# Patient Record
Sex: Male | Born: 1935
Health system: Southern US, Community
[De-identification: ages and names within clinical notes are randomized; demographics above are authoritative.]

## PROBLEM LIST (undated history)

## (undated) DIAGNOSIS — F250 Schizoaffective disorder, bipolar type: Secondary | ICD-10-CM

## (undated) DIAGNOSIS — R413 Other amnesia: Secondary | ICD-10-CM

## (undated) DIAGNOSIS — J449 Chronic obstructive pulmonary disease, unspecified: Secondary | ICD-10-CM

## (undated) DIAGNOSIS — I10 Essential (primary) hypertension: Secondary | ICD-10-CM

## (undated) DIAGNOSIS — I82409 Acute embolism and thrombosis of unspecified deep veins of unspecified lower extremity: Secondary | ICD-10-CM

## (undated) DIAGNOSIS — R569 Unspecified convulsions: Secondary | ICD-10-CM

## (undated) DIAGNOSIS — Z974 Presence of external hearing-aid: Secondary | ICD-10-CM

## (undated) DIAGNOSIS — R911 Solitary pulmonary nodule: Secondary | ICD-10-CM

## (undated) DIAGNOSIS — F259 Schizoaffective disorder, unspecified: Secondary | ICD-10-CM

## (undated) DIAGNOSIS — E119 Type 2 diabetes mellitus without complications: Secondary | ICD-10-CM

## (undated) DIAGNOSIS — R7303 Prediabetes: Secondary | ICD-10-CM

## (undated) DIAGNOSIS — C801 Malignant (primary) neoplasm, unspecified: Secondary | ICD-10-CM

## (undated) DIAGNOSIS — H9190 Unspecified hearing loss, unspecified ear: Secondary | ICD-10-CM

## (undated) DIAGNOSIS — J189 Pneumonia, unspecified organism: Secondary | ICD-10-CM

## (undated) DIAGNOSIS — K08109 Complete loss of teeth, unspecified cause, unspecified class: Secondary | ICD-10-CM

## (undated) DIAGNOSIS — Z972 Presence of dental prosthetic device (complete) (partial): Secondary | ICD-10-CM

## (undated) HISTORY — PX: OTHER SURGICAL HISTORY: SHX169

## (undated) HISTORY — PX: THROMBECTOMY: PRO61

## (undated) HISTORY — PX: COLONOSCOPY: SHX174

## (undated) HISTORY — PX: CATARACT EXTRACTION: SUR2

---

## 2002-02-10 ENCOUNTER — Emergency Department (HOSPITAL_COMMUNITY): Admission: EM | Admit: 2002-02-10 | Discharge: 2002-02-10 | Payer: Self-pay | Admitting: Emergency Medicine

## 2002-04-25 ENCOUNTER — Encounter: Payer: Self-pay | Admitting: Otolaryngology

## 2002-04-25 ENCOUNTER — Ambulatory Visit (HOSPITAL_COMMUNITY): Admission: RE | Admit: 2002-04-25 | Discharge: 2002-04-25 | Payer: Self-pay | Admitting: Otolaryngology

## 2004-10-18 ENCOUNTER — Ambulatory Visit: Payer: Self-pay

## 2004-11-15 ENCOUNTER — Ambulatory Visit: Payer: Self-pay | Admitting: Cardiology

## 2004-12-03 ENCOUNTER — Ambulatory Visit: Payer: Self-pay | Admitting: Internal Medicine

## 2004-12-13 ENCOUNTER — Ambulatory Visit: Payer: Self-pay | Admitting: Cardiovascular Disease

## 2004-12-18 ENCOUNTER — Ambulatory Visit: Payer: Self-pay | Admitting: Internal Medicine

## 2005-01-03 ENCOUNTER — Ambulatory Visit: Payer: Self-pay | Admitting: Cardiovascular Disease

## 2005-01-31 ENCOUNTER — Ambulatory Visit: Payer: Self-pay | Admitting: Cardiology

## 2005-02-28 ENCOUNTER — Ambulatory Visit: Payer: Self-pay | Admitting: Cardiology

## 2005-04-02 ENCOUNTER — Ambulatory Visit: Payer: Self-pay | Admitting: *Deleted

## 2005-04-30 ENCOUNTER — Ambulatory Visit: Payer: Self-pay | Admitting: Cardiology

## 2005-05-28 ENCOUNTER — Ambulatory Visit: Payer: Self-pay | Admitting: Cardiology

## 2005-06-19 ENCOUNTER — Ambulatory Visit: Payer: Self-pay | Admitting: Internal Medicine

## 2005-06-25 ENCOUNTER — Ambulatory Visit: Payer: Self-pay | Admitting: Cardiovascular Disease

## 2005-07-03 ENCOUNTER — Ambulatory Visit: Payer: Self-pay | Admitting: Cardiovascular Disease

## 2005-07-08 ENCOUNTER — Ambulatory Visit: Payer: Self-pay

## 2005-07-23 ENCOUNTER — Ambulatory Visit: Payer: Self-pay | Admitting: Cardiology

## 2005-08-15 ENCOUNTER — Ambulatory Visit: Payer: Self-pay | Admitting: Cardiovascular Disease

## 2005-08-20 ENCOUNTER — Ambulatory Visit: Payer: Self-pay | Admitting: Cardiology

## 2005-09-17 ENCOUNTER — Ambulatory Visit: Payer: Self-pay | Admitting: Cardiovascular Disease

## 2005-10-03 ENCOUNTER — Ambulatory Visit: Payer: Self-pay | Admitting: Internal Medicine

## 2005-10-15 ENCOUNTER — Ambulatory Visit: Payer: Self-pay | Admitting: Cardiology

## 2005-11-19 ENCOUNTER — Ambulatory Visit: Payer: Self-pay | Admitting: Cardiology

## 2005-12-10 ENCOUNTER — Ambulatory Visit: Payer: Self-pay | Admitting: Cardiology

## 2006-01-07 ENCOUNTER — Ambulatory Visit: Payer: Self-pay | Admitting: Cardiology

## 2006-01-20 ENCOUNTER — Emergency Department (HOSPITAL_COMMUNITY): Admission: EM | Admit: 2006-01-20 | Discharge: 2006-01-20 | Payer: Self-pay | Admitting: Emergency Medicine

## 2006-01-21 ENCOUNTER — Ambulatory Visit (HOSPITAL_COMMUNITY): Admission: RE | Admit: 2006-01-21 | Discharge: 2006-01-21 | Payer: Self-pay | Admitting: Emergency Medicine

## 2006-02-04 ENCOUNTER — Ambulatory Visit: Payer: Self-pay | Admitting: Internal Medicine

## 2006-02-10 ENCOUNTER — Ambulatory Visit: Payer: Self-pay | Admitting: Internal Medicine

## 2006-02-14 ENCOUNTER — Ambulatory Visit (HOSPITAL_COMMUNITY): Admission: RE | Admit: 2006-02-14 | Discharge: 2006-02-14 | Payer: Self-pay | Admitting: Internal Medicine

## 2006-02-16 ENCOUNTER — Ambulatory Visit: Payer: Self-pay | Admitting: Internal Medicine

## 2006-02-16 ENCOUNTER — Ambulatory Visit: Payer: Self-pay | Admitting: Cardiovascular Disease

## 2006-02-18 ENCOUNTER — Ambulatory Visit: Payer: Self-pay | Admitting: Internal Medicine

## 2006-03-18 ENCOUNTER — Ambulatory Visit: Payer: Self-pay | Admitting: *Deleted

## 2006-04-15 ENCOUNTER — Ambulatory Visit: Payer: Self-pay | Admitting: Cardiology

## 2006-05-13 ENCOUNTER — Ambulatory Visit: Payer: Self-pay | Admitting: Cardiovascular Disease

## 2006-05-20 ENCOUNTER — Ambulatory Visit: Payer: Self-pay | Admitting: Internal Medicine

## 2006-06-04 ENCOUNTER — Ambulatory Visit: Payer: Self-pay | Admitting: Internal Medicine

## 2006-07-02 ENCOUNTER — Ambulatory Visit: Payer: Self-pay | Admitting: Cardiology

## 2006-07-30 ENCOUNTER — Ambulatory Visit: Payer: Self-pay | Admitting: Cardiology

## 2006-08-18 ENCOUNTER — Ambulatory Visit: Payer: Self-pay | Admitting: Cardiovascular Disease

## 2006-08-27 ENCOUNTER — Ambulatory Visit: Payer: Self-pay | Admitting: Cardiology

## 2006-09-24 ENCOUNTER — Ambulatory Visit: Payer: Self-pay | Admitting: Cardiology

## 2006-10-01 ENCOUNTER — Ambulatory Visit: Payer: Self-pay | Admitting: Internal Medicine

## 2006-10-01 LAB — CONVERTED CEMR LAB
ALT: 29 units/L (ref 0–40)
AST: 22 units/L (ref 0–37)
BUN: 13 mg/dL (ref 6–23)
Chol/HDL Ratio, serum: 3.6
Cholesterol: 181 mg/dL (ref 0–200)
Creatinine, Ser: 1.3 mg/dL (ref 0.4–1.5)
Creatinine,U: 236.7 mg/dL
HDL: 50 mg/dL (ref >39.0)
Hgb A1c MFr Bld: 5.9 % (ref 4.6–6.0)
LDL Cholesterol: 112 mg/dL — ABNORMAL HIGH (ref 0–99)
Microalb Creat Ratio: 0.8 mg/g (ref 0.0–30.0)
Microalb, Ur: 0.2 mg/dL (ref 0.0–1.9)
Potassium: 4 meq/L (ref 3.5–5.1)
Triglyceride fasting, serum: 95 mg/dL (ref 0–149)
VLDL: 19 mg/dL (ref 0–40)

## 2006-10-05 ENCOUNTER — Ambulatory Visit: Payer: Self-pay | Admitting: Internal Medicine

## 2006-10-23 ENCOUNTER — Ambulatory Visit: Payer: Self-pay | Admitting: Internal Medicine

## 2006-10-23 ENCOUNTER — Ambulatory Visit: Payer: Self-pay | Admitting: Cardiology

## 2006-10-23 LAB — CONVERTED CEMR LAB: Total CK: 265 units/L (ref 7–195)

## 2006-10-27 ENCOUNTER — Ambulatory Visit: Payer: Self-pay | Admitting: Internal Medicine

## 2006-11-12 ENCOUNTER — Ambulatory Visit: Payer: Self-pay | Admitting: Cardiology

## 2006-12-03 ENCOUNTER — Ambulatory Visit: Payer: Self-pay | Admitting: Cardiology

## 2006-12-14 ENCOUNTER — Ambulatory Visit: Payer: Self-pay | Admitting: Internal Medicine

## 2006-12-14 LAB — CONVERTED CEMR LAB
ALT: 27 units/L (ref 0–40)
AST: 22 units/L (ref 0–37)
BUN: 13 mg/dL (ref 6–23)
Chol/HDL Ratio, serum: 3.5
Cholesterol: 163 mg/dL (ref 0–200)
Creatinine, Ser: 1.4 mg/dL (ref 0.4–1.5)
HDL: 46.4 mg/dL (ref >39.0)
LDL Cholesterol: 88 mg/dL (ref 0–99)
Potassium: 4 meq/L (ref 3.5–5.1)
Total CK: 214 units/L (ref 7–195)
Triglyceride fasting, serum: 145 mg/dL (ref 0–149)
VLDL: 29 mg/dL (ref 0–40)

## 2006-12-21 ENCOUNTER — Ambulatory Visit: Payer: Self-pay | Admitting: Internal Medicine

## 2006-12-31 ENCOUNTER — Ambulatory Visit: Payer: Self-pay | Admitting: Cardiology

## 2007-01-28 ENCOUNTER — Ambulatory Visit: Payer: Self-pay | Admitting: Cardiology

## 2007-02-25 ENCOUNTER — Ambulatory Visit: Payer: Self-pay | Admitting: Cardiology

## 2007-03-19 DIAGNOSIS — Z86718 Personal history of other venous thrombosis and embolism: Secondary | ICD-10-CM | POA: Insufficient documentation

## 2007-03-19 DIAGNOSIS — I739 Peripheral vascular disease, unspecified: Secondary | ICD-10-CM | POA: Insufficient documentation

## 2007-03-19 DIAGNOSIS — Z85828 Personal history of other malignant neoplasm of skin: Secondary | ICD-10-CM | POA: Insufficient documentation

## 2007-03-19 DIAGNOSIS — F200 Paranoid schizophrenia: Secondary | ICD-10-CM | POA: Insufficient documentation

## 2007-03-25 ENCOUNTER — Ambulatory Visit: Payer: Self-pay | Admitting: Cardiology

## 2007-04-22 ENCOUNTER — Ambulatory Visit: Payer: Self-pay | Admitting: Cardiology

## 2007-05-20 ENCOUNTER — Ambulatory Visit: Payer: Self-pay | Admitting: Cardiology

## 2007-06-03 ENCOUNTER — Ambulatory Visit: Payer: Self-pay | Admitting: Internal Medicine

## 2007-06-03 LAB — CONVERTED CEMR LAB
ALT: 36 units/L (ref 0–53)
AST: 29 units/L (ref 0–37)
Cholesterol: 237 mg/dL (ref 0–200)
Direct LDL: 159.4 mg/dL
HDL: 53.3 mg/dL (ref >39.0)
Total CHOL/HDL Ratio: 4.4
Triglycerides: 155 mg/dL — ABNORMAL HIGH (ref 0–149)
VLDL: 31 mg/dL (ref 0–40)

## 2007-06-08 ENCOUNTER — Ambulatory Visit: Payer: Self-pay | Admitting: Internal Medicine

## 2007-06-08 DIAGNOSIS — E785 Hyperlipidemia, unspecified: Secondary | ICD-10-CM | POA: Insufficient documentation

## 2007-06-08 LAB — CONVERTED CEMR LAB
Cholesterol, target level: 200 mg/dL
HDL goal, serum: 40 mg/dL
LDL Goal: 100 mg/dL

## 2007-06-17 ENCOUNTER — Ambulatory Visit: Payer: Self-pay | Admitting: Cardiology

## 2007-07-08 ENCOUNTER — Ambulatory Visit: Payer: Self-pay | Admitting: Internal Medicine

## 2007-08-03 ENCOUNTER — Ambulatory Visit: Payer: Self-pay | Admitting: Cardiology

## 2007-08-04 ENCOUNTER — Ambulatory Visit: Payer: Self-pay | Admitting: Internal Medicine

## 2007-08-06 ENCOUNTER — Encounter (INDEPENDENT_AMBULATORY_CARE_PROVIDER_SITE_OTHER): Payer: Self-pay | Admitting: *Deleted

## 2007-08-06 LAB — CONVERTED CEMR LAB
Cholesterol: 183 mg/dL (ref 0–200)
HDL: 34.5 mg/dL — ABNORMAL LOW (ref >39.0)
LDL Cholesterol: 119 mg/dL — ABNORMAL HIGH (ref 0–99)
Total CHOL/HDL Ratio: 5.3
Triglycerides: 149 mg/dL (ref 0–149)
VLDL: 30 mg/dL (ref 0–40)

## 2007-08-11 ENCOUNTER — Telehealth: Payer: Self-pay | Admitting: Internal Medicine

## 2007-08-12 ENCOUNTER — Telehealth (INDEPENDENT_AMBULATORY_CARE_PROVIDER_SITE_OTHER): Payer: Self-pay | Admitting: *Deleted

## 2007-08-12 ENCOUNTER — Ambulatory Visit: Payer: Self-pay | Admitting: Internal Medicine

## 2007-08-15 ENCOUNTER — Ambulatory Visit: Payer: Self-pay | Admitting: Internal Medicine

## 2007-08-15 ENCOUNTER — Inpatient Hospital Stay (HOSPITAL_COMMUNITY): Admission: EM | Admit: 2007-08-15 | Discharge: 2007-08-17 | Payer: Self-pay | Admitting: Emergency Medicine

## 2007-08-27 ENCOUNTER — Ambulatory Visit: Payer: Self-pay | Admitting: Cardiovascular Disease

## 2007-08-30 ENCOUNTER — Ambulatory Visit: Payer: Self-pay | Admitting: Internal Medicine

## 2007-08-30 DIAGNOSIS — T887XXA Unspecified adverse effect of drug or medicament, initial encounter: Secondary | ICD-10-CM | POA: Insufficient documentation

## 2007-09-16 ENCOUNTER — Telehealth (INDEPENDENT_AMBULATORY_CARE_PROVIDER_SITE_OTHER): Payer: Self-pay | Admitting: *Deleted

## 2007-09-17 ENCOUNTER — Ambulatory Visit: Payer: Self-pay | Admitting: Cardiovascular Disease

## 2007-10-15 ENCOUNTER — Ambulatory Visit: Payer: Self-pay | Admitting: Cardiology

## 2007-10-21 ENCOUNTER — Ambulatory Visit: Payer: Self-pay | Admitting: Internal Medicine

## 2007-10-23 LAB — CONVERTED CEMR LAB
ALT: 40 units/L (ref 0–53)
HDL: 43.9 mg/dL (ref >39.0)
LDL Cholesterol: 77 mg/dL (ref 0–99)
Triglycerides: 112 mg/dL (ref 0–149)
VLDL: 22 mg/dL (ref 0–40)

## 2007-10-25 ENCOUNTER — Encounter (INDEPENDENT_AMBULATORY_CARE_PROVIDER_SITE_OTHER): Payer: Self-pay | Admitting: *Deleted

## 2007-11-05 ENCOUNTER — Ambulatory Visit: Payer: Self-pay | Admitting: Cardiology

## 2007-11-15 ENCOUNTER — Ambulatory Visit: Payer: Self-pay | Admitting: Surgery

## 2007-12-13 ENCOUNTER — Telehealth (INDEPENDENT_AMBULATORY_CARE_PROVIDER_SITE_OTHER): Payer: Self-pay | Admitting: *Deleted

## 2007-12-22 ENCOUNTER — Emergency Department (HOSPITAL_COMMUNITY): Admission: EM | Admit: 2007-12-22 | Discharge: 2007-12-22 | Payer: Self-pay | Admitting: Emergency Medicine

## 2008-01-25 ENCOUNTER — Telehealth (INDEPENDENT_AMBULATORY_CARE_PROVIDER_SITE_OTHER): Payer: Self-pay | Admitting: *Deleted

## 2008-01-26 ENCOUNTER — Encounter: Payer: Self-pay | Admitting: Internal Medicine

## 2008-05-11 ENCOUNTER — Telehealth (INDEPENDENT_AMBULATORY_CARE_PROVIDER_SITE_OTHER): Payer: Self-pay | Admitting: *Deleted

## 2008-05-16 ENCOUNTER — Encounter: Payer: Self-pay | Admitting: Internal Medicine

## 2008-11-01 ENCOUNTER — Encounter: Payer: Self-pay | Admitting: Internal Medicine

## 2008-11-22 ENCOUNTER — Ambulatory Visit: Payer: Self-pay | Admitting: Oncology

## 2008-11-30 ENCOUNTER — Ambulatory Visit: Admission: RE | Admit: 2008-11-30 | Discharge: 2008-11-30 | Payer: Self-pay | Admitting: Oncology

## 2008-11-30 LAB — URINALYSIS, MICROSCOPIC - CHCC
Ketones: NEGATIVE mg/dL
Nitrite: NEGATIVE
Protein: NEGATIVE mg/dL
Specific Gravity, Urine: 1.005 (ref 1.003–1.035)
pH: 6 (ref 4.6–8.0)

## 2008-11-30 LAB — CBC WITH DIFFERENTIAL/PLATELET
BASO%: 0.6 % (ref 0.0–2.0)
Basophils Absolute: 0 10*3/uL (ref 0.0–0.1)
EOS%: 3.5 % (ref 0.0–7.0)
HGB: 17.2 g/dL — ABNORMAL HIGH (ref 13.0–17.1)
MCH: 30.3 pg (ref 28.0–33.4)
MCHC: 33.5 g/dL (ref 32.0–35.9)
RDW: 14.4 % (ref 11.2–14.6)
WBC: 8.4 10*3/uL (ref 4.0–10.0)
lymph#: 2.5 10*3/uL (ref 0.9–3.3)

## 2008-11-30 LAB — MORPHOLOGY: PLT EST: ADEQUATE

## 2008-12-05 LAB — ERYTHROPOIETIN: Erythropoietin: 7.6 m[IU]/mL (ref 2.6–34.0)

## 2008-12-05 LAB — COMPREHENSIVE METABOLIC PANEL
Albumin: 4.6 g/dL (ref 3.5–5.2)
Alkaline Phosphatase: 33 U/L — ABNORMAL LOW (ref 39–117)
BUN: 23 mg/dL (ref 6–23)
Calcium: 9.6 mg/dL (ref 8.4–10.5)
Chloride: 105 mEq/L (ref 96–112)
Glucose, Bld: 97 mg/dL (ref 70–99)
Potassium: 4.4 mEq/L (ref 3.5–5.3)
Sodium: 142 mEq/L (ref 135–145)
Total Protein: 7.2 g/dL (ref 6.0–8.3)

## 2008-12-28 LAB — CBC WITH DIFFERENTIAL/PLATELET
BASO%: 0.6 % (ref 0.0–2.0)
EOS%: 3.7 % (ref 0.0–7.0)
HCT: 50.7 % — ABNORMAL HIGH (ref 38.7–49.9)
MCH: 30.1 pg (ref 28.0–33.4)
MCHC: 33.8 g/dL (ref 32.0–35.9)
NEUT%: 54.2 % (ref 40.0–75.0)
RBC: 5.69 10*6/uL (ref 4.20–5.71)
lymph#: 2.7 10*3/uL (ref 0.9–3.3)

## 2009-04-23 ENCOUNTER — Ambulatory Visit: Payer: Self-pay | Admitting: Oncology

## 2009-04-25 LAB — CBC WITH DIFFERENTIAL/PLATELET
BASO%: 0.5 % (ref 0.0–2.0)
EOS%: 3.9 % (ref 0.0–7.0)
Eosinophils Absolute: 0.3 10*3/uL (ref 0.0–0.5)
LYMPH%: 29.8 % (ref 14.0–49.0)
MCH: 29.9 pg (ref 27.2–33.4)
MCHC: 34.2 g/dL (ref 32.0–36.0)
MCV: 87.4 fL (ref 79.3–98.0)
MONO%: 9.8 % (ref 0.0–14.0)
Platelets: 213 10*3/uL (ref 140–400)
RBC: 6.09 10*6/uL — ABNORMAL HIGH (ref 4.20–5.82)
RDW: 14.4 % (ref 11.0–14.6)

## 2009-04-25 LAB — CHCC SMEAR

## 2009-05-30 ENCOUNTER — Encounter: Payer: Self-pay | Admitting: Internal Medicine

## 2009-06-22 ENCOUNTER — Ambulatory Visit: Payer: Self-pay | Admitting: Oncology

## 2009-06-27 LAB — CBC WITH DIFFERENTIAL/PLATELET
Eosinophils Absolute: 0.3 10*3/uL (ref 0.0–0.5)
MONO#: 0.5 10*3/uL (ref 0.1–0.9)
NEUT#: 4.7 10*3/uL (ref 1.5–6.5)
RBC: 5.77 10*6/uL (ref 4.20–5.82)
RDW: 13.7 % (ref 11.0–14.6)
WBC: 7.7 10*3/uL (ref 4.0–10.3)
lymph#: 2.2 10*3/uL (ref 0.9–3.3)

## 2009-08-21 ENCOUNTER — Ambulatory Visit: Payer: Self-pay | Admitting: Oncology

## 2009-08-23 LAB — CBC WITH DIFFERENTIAL/PLATELET
BASO%: 0.2 % (ref 0.0–2.0)
Basophils Absolute: 0 10*3/uL (ref 0.0–0.1)
EOS%: 3.7 % (ref 0.0–7.0)
HGB: 17.6 g/dL — ABNORMAL HIGH (ref 13.0–17.1)
MCH: 30.8 pg (ref 27.2–33.4)
MCHC: 34.3 g/dL (ref 32.0–36.0)
MCV: 89.6 fL (ref 79.3–98.0)
MONO%: 9.7 % (ref 0.0–14.0)
NEUT%: 57.6 % (ref 39.0–75.0)
RDW: 14.2 % (ref 11.0–14.6)

## 2009-08-23 LAB — COMPREHENSIVE METABOLIC PANEL
Albumin: 4.4 g/dL (ref 3.5–5.2)
BUN: 17 mg/dL (ref 6–23)
CO2: 23 mEq/L (ref 19–32)
Glucose, Bld: 134 mg/dL — ABNORMAL HIGH (ref 70–99)
Potassium: 3.9 mEq/L (ref 3.5–5.3)
Sodium: 139 mEq/L (ref 135–145)
Total Protein: 6.9 g/dL (ref 6.0–8.3)

## 2009-08-23 LAB — MORPHOLOGY

## 2009-10-22 ENCOUNTER — Ambulatory Visit: Payer: Self-pay | Admitting: Oncology

## 2009-10-24 DIAGNOSIS — D751 Secondary polycythemia: Secondary | ICD-10-CM | POA: Insufficient documentation

## 2009-10-24 DIAGNOSIS — N529 Male erectile dysfunction, unspecified: Secondary | ICD-10-CM | POA: Insufficient documentation

## 2009-10-24 DIAGNOSIS — Z720 Tobacco use: Secondary | ICD-10-CM | POA: Insufficient documentation

## 2009-10-24 DIAGNOSIS — F209 Schizophrenia, unspecified: Secondary | ICD-10-CM | POA: Insufficient documentation

## 2009-10-24 DIAGNOSIS — M1990 Unspecified osteoarthritis, unspecified site: Secondary | ICD-10-CM | POA: Insufficient documentation

## 2009-10-24 DIAGNOSIS — F329 Major depressive disorder, single episode, unspecified: Secondary | ICD-10-CM | POA: Insufficient documentation

## 2009-10-24 LAB — CBC WITH DIFFERENTIAL/PLATELET
BASO%: 0.5 % (ref 0.0–2.0)
Basophils Absolute: 0 10*3/uL (ref 0.0–0.1)
EOS%: 3.1 % (ref 0.0–7.0)
HCT: 51.6 % — ABNORMAL HIGH (ref 38.4–49.9)
HGB: 17 g/dL (ref 13.0–17.1)
LYMPH%: 31.7 % (ref 14.0–49.0)
MCH: 30.1 pg (ref 27.2–33.4)
MCHC: 32.9 g/dL (ref 32.0–36.0)
MCV: 91.5 fL (ref 79.3–98.0)
MONO%: 7.9 % (ref 0.0–14.0)
NEUT%: 56.8 % (ref 39.0–75.0)
Platelets: 213 10*3/uL (ref 140–400)
lymph#: 2.6 10*3/uL (ref 0.9–3.3)

## 2009-11-01 DIAGNOSIS — Z7901 Long term (current) use of anticoagulants: Secondary | ICD-10-CM | POA: Insufficient documentation

## 2009-11-07 ENCOUNTER — Encounter: Payer: Self-pay | Admitting: Internal Medicine

## 2009-12-24 ENCOUNTER — Ambulatory Visit: Payer: Self-pay | Admitting: Oncology

## 2009-12-26 LAB — CBC WITH DIFFERENTIAL/PLATELET
Basophils Absolute: 0 10*3/uL (ref 0.0–0.1)
Eosinophils Absolute: 0.3 10*3/uL (ref 0.0–0.5)
HGB: 17.4 g/dL — ABNORMAL HIGH (ref 13.0–17.1)
LYMPH%: 30.9 % (ref 14.0–49.0)
MCV: 91.1 fL (ref 79.3–98.0)
MONO%: 9 % (ref 0.0–14.0)
NEUT#: 4.4 10*3/uL (ref 1.5–6.5)
NEUT%: 55.5 % (ref 39.0–75.0)
Platelets: 218 10*3/uL (ref 140–400)

## 2009-12-26 LAB — COMPREHENSIVE METABOLIC PANEL
Albumin: 4.7 g/dL (ref 3.5–5.2)
Alkaline Phosphatase: 39 U/L (ref 39–117)
CO2: 28 mEq/L (ref 19–32)
Chloride: 101 mEq/L (ref 96–112)
Glucose, Bld: 109 mg/dL — ABNORMAL HIGH (ref 70–99)
Potassium: 5 mEq/L (ref 3.5–5.3)
Sodium: 138 mEq/L (ref 135–145)
Total Protein: 7.1 g/dL (ref 6.0–8.3)

## 2010-05-09 ENCOUNTER — Encounter: Payer: Self-pay | Admitting: Internal Medicine

## 2011-01-02 NOTE — Letter (Signed)
Summary: [facility] Surgical Oncology & Endocrine Surgery  [facility] Surgical Oncology & Endocrine Surgery   Imported By: Lanelle Bal 06/10/2010 09:30:58  _____________________________________________________________________  External Attachment:    Type:   Image     Comment:   External Document

## 2011-01-02 NOTE — Letter (Signed)
Summary: [facility] Surgical Oncology & Endocrine Surgery  [facility] Surgical Oncology & Endocrine Surgery   Imported By: Lanelle Bal 01/01/2010 08:34:33  _____________________________________________________________________  External Attachment:    Type:   Image     Comment:   External Document

## 2011-04-15 NOTE — H&P (Signed)
NAME:  Robert Fisher, [place]ADD[place].:  [id number]   MEDICAL RECORD NO.:  [id number]          PATIENT TYPE:  EMS   LOCATION:  MAJO                         FACILITY:  [facility]   PHYSICIAN:  Jason Nest, MD     DATE OF BIRTH:  July 07, 1934   DATE OF ADMISSION:  08/14/2007  DATE OF DISCHARGE:                              HISTORY & PHYSICAL   HISTORY OF PRESENT ILLNESS:  Robert Fisher is a 77 year old male with a  history of schizophrenia on Tegretol.  For some time he presented to the  emergency department after having an episode of shaking of her upper  extremities and shaking of his head.  It lasted approximately 30  seconds.  This was described to be seizure like.  He did not have any  loss of consciousness.  He states this has happened before and that it  resolved on its own.  No loss of bowel or bladder function.  He and his  wife were concerned therefore he presented to the emergency department.  No chest pain, shortness of breath or diaphoresis.  He is currently  asymptomatic.  He states that he feels the best that he has ever felt.  His Tegretol level was checked . it was found to be markedly elevated at  53.  He has been on the same dose for some time and has not altered his  medication regimen at all.  He is very educated about his medications  and he has not taken any doses erroneously.   PAST MEDICAL HISTORY:  1. Schizophrenia.  2. 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  3. Hyperlipidemia.  4. Hypertension.  5. Impaired fasting glucose.   MEDICATIONS:  1. Tegretol 200 mg b.i.d.  2. Clonazepam 0.5 mg daily.  3. Coumadin 5 mg daily.  4. Crestor 20 mg daily.  5. HCTZ 12.5 mg daily.  6. Lopressor 25 mg daily.  7. Metformin 500 mg daily.  8. Tricor 145 daily.  9. Zyprexa 10 at bedtime.  10.Wellbutrin 100 b.i.d.   ALLERGIES:  Viagra.   FAMILY HISTORY:  The patient is adopted and has adopted  children,  therefore noncontributory.   REVIEW OF SYSTEMS:  All systems reviewed and negative except as  previously stated.   SOCIAL HISTORY:  The patient is married.  He currently does smoke a  pipe.  He has a considerable tobacco history.  No alcohol or illicit  drug use.   PHYSICAL EXAMINATION:  VITAL SIGNS:  Blood pressure 129/75, heart rate  60, temperature initially 99, rechecked was 97.  GENERAL:  Alert and oriented times 3.  Slightly slow speech, however,  does not have dysarthria.  CARDIOVASCULAR:  Bradycardiac with faint heart sounds, no murmurs, rubs  or gallops.  CHEST:  Clear to auscultation bilaterally.  ABDOMEN:  Positive bowel sounds, soft, nontender.  EXTREMITIES:  No edema.  NEURO:  Motor is 5/5 throughout.  There are no tremors present.   LABORATORY:  White count 9.1, hemoglobin 16.6, platelets 251, INR is  1.5, sodium  138, potassium 3.7, chloride 102, BUN 21, creatinine 1.5.  He does have an old creatinine of 1.4, glucose is 109.   IMPRESSION AND PLAN:  1. 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  EKG showed sinus      with a rate of 61.  He does have first degree A-V block with a PR      interval of 216.  There is ST depression with T wave inversions      laterally and biphasic P waves.  We will admit him to a telemetry      bed to monitor for arrhythmias as well as seizure.  I did speak      with the pharmacy and they say it will take approximately 24 to 36      hours to have his levels become nontoxic.  It is unclear the      etiology of the toxicity as the medication dose has not changed      recently.  Question if he has some liver impairment.  We will add      on LFTs to his labs that were already drawn.  Will try to get some      records from his psychiatrist to see if he has any old levels to      compare this to.  Of note it will be important to monitor his INR      closely and  his Coumadin as the Tegretol dose increased the      metabolism on Coumadin.  So as to his Tegretol level comes down      perhaps he might need less Coumadin than he is currently taking.      Will monitor the electrolytes closely.  It does not appear that he      has any bone marrow suppression from this Tegretol.  2. History of arterial thrombosis.  Continue with Coumadin and monitor      closely as stated above.  3. Chronic kidney disease.  His creatinine is 1.5 today.  Therefore      will hold Metformin.  4. Schizophrenia.  Continue with his Zyprexa and Clonazepam.  5. First degree A-V block.  Question if this is from the Tegretol or      if it is old.  Will hold Lopressor.  6. ST depression. Also question if this is from Tegretol or not.  Will      cycle cardiac enzymes, the first set was negative.           ______________________________  Jason Nest, MD     MW/MEDQ  D:  08/15/2007  T:  08/15/2007  Job:  [id number]

## 2011-04-15 NOTE — Discharge Summary (Signed)
NAME:  Robert Fisher, MEAS NO.:  [id number]   MEDICAL RECORD NO.:  [id number]          PATIENT TYPE:  INP   LOCATION:  6715                         FACILITY:  [facility]   PHYSICIAN:  Valerie A. Felicity Coyer, MDDATE OF BIRTH:  08/27/1934   DATE OF ADMISSION:  08/14/2007  DATE OF DISCHARGE:  08/17/2007                               DISCHARGE SUMMARY   DISCHARGE DIAGNOSES:  1. Tegretol toxicity.  2. History of schizophrenia followed by Dr. Andee Poles.  3. 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  4. Hyperlipidemia.  5. History of impaired fasting glucose stable on metformin.  6. Tobacco abuse.  Motivated to quit at time of discharge.  7. Right ear lesion.  Rule out basal cell carcinoma.  Needs outpatient      dermatology follow-up.   HISTORY OF PRESENT ILLNESS:  Robert Fisher is a 77 year old male who was  admitted on August 14, 2007 via the emergency department after having  an episode of shaking of his upper extremities and of his head.  This  lasted approximately 30 seconds. Apparently it was described to be  seizure-like.  He did not have any loss of consciousness.  He was noted  to have an elevated Tegretol level in the emergency department with a  value of 53.  He was admitted for further evaluation and treatment.   PAST MEDICAL HISTORY:  1. Schizophrenia.  2. 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  3. Hyperlipidemia.  4. Hypertension.  5. Impaired fasting glucose.   COURSE OF HOSPITALIZATION:  Problem #1.  Tegretol toxicity.  The patient  was admitted, and his Tegretol was held.  Follow-up Tegretol levels  performed on August 17, 2007 were less than 2.  The patient's family  reports that he had been taking Tegretol for tremors which were  associated with his psychiatric medications.  At this time we plan to  discontinue Tegretol and will defer to psychiatry whether to resume  at a  decreased dose.  The patient's creatinine is at baseline.  He was  continued on his Coumadin which is subtherapeutic at time of discharge.  He will need close monitoring of his Coumadin levels now that the  patient is off of Tegretol.  Problem #2.  Right ear lesion.  The patient was noted to have an open  lesion above his right ear where the ear meets the scalp.  The patient  has a history of basal cell carcinoma.  This will need evaluation by  dermatology.  The patient has been instructed to follow up with his  dermatologist and to apply antibiotic ointment to affected area until  further instructions per dermatology   MEDICATIONS AT TIME OF DISCHARGE:  1. Hydrochlorothiazide 12.5 mg p.o. daily.  2. Zyprexa 10 mg p.o. daily.  3. Tricor 145 mg p.o. daily.  4. Crestor 10 mg p.o. daily.  5. Klonopin 0.5 mg p.o. daily.  6. Wellbutrin 10 mg p.o. b.i.d.  7. Coumadin 5 mg p.o. daily to be adjusted by Coumadin  clinic.  8. Nicotine patch 14 mg changed every 24 hours.  9. Lopressor 25 mg p.o. daily.  10.Metformin 500 mg p.o. daily.   PERTINENT LABORATORIES:  At time of discharge Tegretol less than 2, INR  1.3, BUN 17, creatinine 1.4.   DISPOSITION/PLAN:  Discharge patient to home.   FOLLOW UP:  The patient is instructed to follow up with Dr. Marga Melnick on Tuesday, September 23, at 2:45 p.m.  He is also instructed to  follow up in the Coumadin Clinic on September 24, at 11 a.m., and to  follow up with Dr. Nolen Mu on August 19, 2007 as scheduled and to  follow up with dermatology to further evaluate skin lesion behind right  ear.      Sandford Craze, NP      Raenette Rover. Felicity Coyer, MD  Electronically Signed    MO/MEDQ  D:  08/17/2007  T:  08/17/2007  Job:  [id number]   cc:   Titus Dubin. Alwyn Ren, MD,FACP,FCCP  Andee Poles, M.D.  Shelby Dubin, PharmD, BCPS, CPP

## 2011-04-18 NOTE — Assessment & Plan Note (Signed)
[facility]                              CARDIOLOGY OFFICE NOTE   NAME:[name], [name]                    MRN:          [id number]  DATE:08/18/2006                            DOB:          July 23, 1934    [name] returns today for followup.  He has had previous embolic event  to, I believe, the right lower extremity.   He continues to have some problems with exercise.  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  He has been on Coumadin since that time, I believe, in 1998.   His wife works in Administrator Records at [employer] and is due to retire.   The patient had multiple coronary risk factors and we ordered a Myoview in  August 2006 which was nonischemic with an EF of 66%.   I will have to pull his hospital records to further assess the workup he had  for his peripheral emboli.  However, as I recall, there was no evidence of  an abdominal aortic aneurysm.  The last time I saw him, he had a problem  with a bulging disk in the lower spine, L5-S1.  He did not see Dr. Wynetta Emery for  this and said that a chiropractor helped him with the problem.  He has lost  about 10 pounds since I last saw him and I congratulated him on this.   On exam, the blood pressure is under reasonable control at 155/77.  Pulse 65  and regular.  There has been no history of atrial fibrillation or  palpitations.  His lungs are clear.  Carotids are normal.  There is a S1, S2 with no murmur, rub, gallop, or click.  ABDOMEN:  Benign.  Distal pulses are intact with +2 PT in the right.   EKG shows sinus rhythm with T-wave inversions laterally which are chronic.   IMPRESSION:  Baseline abnormal EKG with normal Myoview last year.  No chest  pain.  Continue risk factor modification, including TriCor for  hyperlipidemia.   The patient's blood pressure is under reasonable control with Lopressor and  hydrochlorothiazide.   He  continues to have problems with exertional pain in his right lower  extremity.  Although this is likely to be a residual from his emboli, we  will check ankle brachial indices.   He will continue his Coumadin due to his history of embolic event, and he  also has significant schizophrenia and is on 3 or 4 medications for this  chronically.  This seems to be under good control.   I will see him back in 6 months.                               Noralyn Pick. Eden Emms, MD, [facility]    PCN/MedQ  DD:  08/18/2006  DT:  08/19/2006  Job #:  [id number]

## 2011-08-21 LAB — CBC
MCHC: 33.4
MCV: 90.6
Platelets: 271
WBC: 9.9

## 2011-08-21 LAB — I-STAT 8, (EC8 V) (CONVERTED LAB)
Bicarbonate: 31.7 — ABNORMAL HIGH
Glucose, Bld: 102 — ABNORMAL HIGH
Sodium: 140
TCO2: 34
pH, Ven: 7.315 — ABNORMAL HIGH

## 2011-08-21 LAB — POCT I-STAT CREATININE: Creatinine, Ser: 1.5

## 2011-08-21 LAB — PROTIME-INR: Prothrombin Time: 27.7 — ABNORMAL HIGH

## 2011-09-05 LAB — BLOOD GAS, ARTERIAL
Drawn by: 211791
FIO2: 0.21 %
pCO2 arterial: 36.6 mmHg (ref 35.0–45.0)
pH, Arterial: 7.42 (ref 7.350–7.450)

## 2011-09-05 LAB — CARBOXYHEMOGLOBIN: Total hemoglobin: 17.2 g/dL (ref 13.5–18.0)

## 2011-09-11 LAB — BASIC METABOLIC PANEL
BUN: 17
Chloride: 101
Creatinine, Ser: 1.4
GFR calc non Af Amer: 50 — ABNORMAL LOW
Glucose, Bld: 105 — ABNORMAL HIGH

## 2011-09-11 LAB — CBC
HCT: 48.2
MCV: 91.9
MCV: 92.5
Platelets: 229
Platelets: 233
RDW: 13.1
RDW: 13.6
WBC: 8.1

## 2011-09-11 LAB — PROTIME-INR: Prothrombin Time: 16 — ABNORMAL HIGH

## 2011-09-12 LAB — HEPATIC FUNCTION PANEL
AST: 19
Albumin: 3.2 — ABNORMAL LOW
Alkaline Phosphatase: 76
Bilirubin, Direct: 0.2
Total Bilirubin: 1.3 — ABNORMAL HIGH

## 2011-09-12 LAB — I-STAT 8, (EC8 V) (CONVERTED LAB)
BUN: 21
Bicarbonate: 28.4 — ABNORMAL HIGH
HCT: 52
Operator id: 192351
pCO2, Ven: 48.7
pH, Ven: 7.374 — ABNORMAL HIGH

## 2011-09-12 LAB — CBC
HCT: 49.5
Hemoglobin: 16.6
RBC: 5.34
WBC: 9.1

## 2011-09-12 LAB — POCT I-STAT CREATININE
Creatinine, Ser: 1.5
Operator id: 192351

## 2011-09-12 LAB — CARBAMAZEPINE LEVEL, TOTAL: Carbamazepine Lvl: 53.3

## 2011-09-12 LAB — BASIC METABOLIC PANEL
BUN: 11
Calcium: 8.9
Chloride: 97
Creatinine, Ser: 0.87
GFR calc Af Amer: >60

## 2011-09-12 LAB — POCT CARDIAC MARKERS
Myoglobin, poc: 160
Operator id: 192351
Troponin i, poc: <0.05

## 2011-09-12 LAB — CK TOTAL AND CKMB (NOT AT ARMC)
Relative Index: INVALID
Total CK: 38

## 2011-09-12 LAB — DIFFERENTIAL
Eosinophils Relative: 4
Lymphocytes Relative: 35
Lymphs Abs: 3.2
Monocytes Absolute: 1 — ABNORMAL HIGH
Monocytes Relative: 11
Neutro Abs: 4.5

## 2011-09-12 LAB — HEMOGLOBIN A1C
Hgb A1c MFr Bld: 5.7
Mean Plasma Glucose: 126

## 2011-09-12 LAB — CARDIAC PANEL(CRET KIN+CKTOT+MB+TROPI)
Relative Index: 3.7 — ABNORMAL HIGH
Total CK: 207
Troponin I: 0.04

## 2011-11-12 DIAGNOSIS — L821 Other seborrheic keratosis: Secondary | ICD-10-CM | POA: Insufficient documentation

## 2012-04-14 DIAGNOSIS — M545 Low back pain, unspecified: Secondary | ICD-10-CM | POA: Insufficient documentation

## 2012-06-30 DIAGNOSIS — N1831 Chronic kidney disease, stage 3a: Secondary | ICD-10-CM | POA: Insufficient documentation

## 2013-01-25 ENCOUNTER — Emergency Department (HOSPITAL_COMMUNITY): Payer: Medicare Other

## 2013-01-25 ENCOUNTER — Emergency Department (HOSPITAL_COMMUNITY)
Admission: EM | Admit: 2013-01-25 | Discharge: 2013-01-25 | Disposition: A | Discharge status: Home / Self Care | Payer: Medicare Other | Attending: Emergency Medicine | Admitting: Emergency Medicine

## 2013-01-25 ENCOUNTER — Encounter (HOSPITAL_COMMUNITY): Payer: Self-pay | Admitting: *Deleted

## 2013-01-25 DIAGNOSIS — I10 Essential (primary) hypertension: Secondary | ICD-10-CM | POA: Insufficient documentation

## 2013-01-25 DIAGNOSIS — F259 Schizoaffective disorder, unspecified: Secondary | ICD-10-CM | POA: Insufficient documentation

## 2013-01-25 DIAGNOSIS — Z87891 Personal history of nicotine dependence: Secondary | ICD-10-CM | POA: Insufficient documentation

## 2013-01-25 DIAGNOSIS — W010XXA Fall on same level from slipping, tripping and stumbling without subsequent striking against object, initial encounter: Secondary | ICD-10-CM | POA: Insufficient documentation

## 2013-01-25 DIAGNOSIS — S61409A Unspecified open wound of unspecified hand, initial encounter: Secondary | ICD-10-CM | POA: Insufficient documentation

## 2013-01-25 DIAGNOSIS — W19XXXA Unspecified fall, initial encounter: Secondary | ICD-10-CM

## 2013-01-25 DIAGNOSIS — Z7982 Long term (current) use of aspirin: Secondary | ICD-10-CM | POA: Insufficient documentation

## 2013-01-25 DIAGNOSIS — T07XXXA Unspecified multiple injuries, initial encounter: Secondary | ICD-10-CM

## 2013-01-25 DIAGNOSIS — Z7901 Long term (current) use of anticoagulants: Secondary | ICD-10-CM | POA: Insufficient documentation

## 2013-01-25 DIAGNOSIS — Z79899 Other long term (current) drug therapy: Secondary | ICD-10-CM | POA: Insufficient documentation

## 2013-01-25 DIAGNOSIS — Y92009 Unspecified place in unspecified non-institutional (private) residence as the place of occurrence of the external cause: Secondary | ICD-10-CM | POA: Insufficient documentation

## 2013-01-25 DIAGNOSIS — Z86718 Personal history of other venous thrombosis and embolism: Secondary | ICD-10-CM | POA: Insufficient documentation

## 2013-01-25 DIAGNOSIS — Y9389 Activity, other specified: Secondary | ICD-10-CM | POA: Insufficient documentation

## 2013-01-25 DIAGNOSIS — IMO0002 Reserved for concepts with insufficient information to code with codable children: Secondary | ICD-10-CM | POA: Insufficient documentation

## 2013-01-25 DIAGNOSIS — Z23 Encounter for immunization: Secondary | ICD-10-CM | POA: Insufficient documentation

## 2013-01-25 DIAGNOSIS — S0990XA Unspecified injury of head, initial encounter: Secondary | ICD-10-CM | POA: Insufficient documentation

## 2013-01-25 HISTORY — DX: Acute embolism and thrombosis of unspecified deep veins of unspecified lower extremity: I82.409

## 2013-01-25 HISTORY — DX: Essential (primary) hypertension: I10

## 2013-01-25 HISTORY — DX: Schizoaffective disorder, unspecified: F25.9

## 2013-01-25 HISTORY — DX: Schizoaffective disorder, bipolar type: F25.0

## 2013-01-25 LAB — PROTIME-INR: INR: 1.51 — ABNORMAL HIGH (ref 0.00–1.49)

## 2013-01-25 MED ORDER — TETANUS-DIPHTH-ACELL PERTUSSIS 5-2.5-18.5 LF-MCG/0.5 IM SUSP
0.5000 mL | Freq: Once | INTRAMUSCULAR | Status: AC
  Administered 2013-01-25: 0.5 mL via INTRAMUSCULAR
  Filled 2013-01-25: qty 0.5

## 2013-01-25 MED ORDER — HYDROCODONE-ACETAMINOPHEN 5-325 MG PO TABS: 1.0000 | ORAL_TABLET | Freq: Four times a day (QID) | ORAL | Status: DC | PRN

## 2013-01-25 NOTE — ED Provider Notes (Signed)
History     CSN: [id number]  Arrival date & time 01/25/13  6295   First MD Initiated Contact with Patient 01/25/13 [phone]      Chief Complaint  Patient presents with  . Fall    (Consider location/radiation/quality/duration/timing/severity/associated sxs/prior treatment) Patient is a 79 y.o. male presenting with fall. The history is provided by the patient, the spouse and a relative.  Fall Pertinent negatives include no fever, no abdominal pain, no nausea, no vomiting, no hematuria and no headaches.   Patient status post fall stumbled going up the driveway landed on his face hands and knees. Patient able to get up and walk since then. Went to urgent care but referred here because on Coumadin and had his head. Patient denies headache nose bleed neck pain chest pain abdominal pain. Patient with abrasions to the bridge of his nose both hands and knees.  Past Medical History  Diagnosis Date  . DVT (deep venous thrombosis)   . Hypertension   . Schizo affective schizophrenia     History reviewed. No pertinent past surgical history.  No family history on file.  History  Substance Use Topics  . Smoking status: Former Games developer  . Smokeless tobacco: Not on file  . Alcohol Use: No      Review of Systems  Constitutional: Negative for fever.  HENT: Negative for nosebleeds and neck pain.   Eyes: Positive for redness. Negative for pain and visual disturbance.  Respiratory: Negative for shortness of breath.   Cardiovascular: Negative for chest pain.  Gastrointestinal: Negative for nausea, vomiting and abdominal pain.  Genitourinary: Negative for hematuria.  Skin: Negative for rash.  Neurological: Negative for dizziness, syncope, light-headedness and headaches.  Hematological: Bruises/bleeds easily.    Allergies  Haloperidol lactate and Sildenafil  Home Medications   Current Outpatient Rx  Name  Route  Sig  Dispense  Refill  . aspirin EC 81 MG tablet   Oral   Take 81 mg by  mouth daily.         Marland Kitchen buPROPion (WELLBUTRIN) 100 MG tablet   Oral   Take 100 mg by mouth 2 (two) times daily.         . clonazePAM (KLONOPIN) 0.5 MG tablet   Oral   Take 0.5 mg by mouth 2 (two) times daily as needed for anxiety.         . fenofibrate 160 MG tablet   Oral   Take 160 mg by mouth daily.         . hydrochlorothiazide (HYDRODIURIL) 25 MG tablet   Oral   Take 12.5 mg by mouth daily.         Marland Kitchen ibuprofen (ADVIL,MOTRIN) 200 MG tablet   Oral   Take 200 mg by mouth See admin instructions. Takes once every 4 or 5 days.         . metFORMIN (GLUCOPHAGE) 500 MG tablet   Oral   Take 500 mg by mouth daily with breakfast.         . metoprolol (LOPRESSOR) 50 MG tablet   Oral   Take 50 mg by mouth daily.         Marland Kitchen OLANZapine (ZYPREXA) 10 MG tablet   Oral   Take 10 mg by mouth at bedtime.         Marland Kitchen PRESCRIPTION MEDICATION      Knee injections in both knees every four to five months.         . rosuvastatin (CRESTOR) 20 MG  tablet   Oral   Take 20 mg by mouth every evening.         . warfarin (COUMADIN) 5 MG tablet   Oral   Take 2.5-5 mg by mouth daily. Take 1/2 tablet (2.5 mg) on Sundays, Mondays, Tuesdays, Thursdays, and Saturday. Take 1 tablet (5mg ) on Wednesdays and Fridays.         Marland Kitchen HYDROcodone-acetaminophen (NORCO/VICODIN) 5-325 MG per tablet   Oral   Take 1 tablet by mouth every 6 (six) hours as needed for pain.   10 tablet   0     BP 149/69  Temp(Src) 97.7 F (36.5 C)  Ht 5\' 11"  (1.803 m)  Wt 205 lb (92.987 kg)  BMI 28.6 kg/m2  SpO2 95%  Physical Exam  Nursing note and vitals reviewed. Constitutional: He is oriented to person, place, and time. He appears well-developed and well-nourished.  HENT:  Head: Normocephalic.  Mouth/Throat: Oropharynx is clear and moist.  Abrasions to the bridge of the nose no deformity some swelling. No nosebleed.  Eyes: Conjunctivae and EOM are normal. Pupils are equal, round, and reactive to  light.  Neck: Normal range of motion.  Cardiovascular: Normal rate, regular rhythm and normal heart sounds.   No murmur heard. Pulmonary/Chest: Effort normal and breath sounds normal.  Abdominal: Soft. Bowel sounds are normal. There is no tenderness.  Musculoskeletal: Normal range of motion.  Skin tears and abrasions to the posterior aspect of both hands. Measuring about 1-2 cm each. Bilateral abrasions scattered to both knees. No effusion no deformity.  Neurological: He is alert and oriented to person, place, and time. No cranial nerve deficit. He exhibits normal muscle tone. Coordination normal.  Skin: Skin is warm. No rash noted.    ED Course  Procedures (including critical care time)  Labs Reviewed  PROTIME-INR - Abnormal; Notable for the following:    Prothrombin Time 17.8 (*)    INR 1.51 (*)    All other components within normal limits   Ct Head Wo Contrast  01/25/2013  *RADIOLOGY REPORT*  Clinical Data: Head and face trauma secondary to a fall today.  CT HEAD WITHOUT CONTRAST  Technique:  Contiguous axial images were obtained from the base of the skull through the vertex without contrast.  Comparison:  08/15/2007  Findings: There is no acute intracranial hemorrhage, infarction, or mass lesion.  Mild diffuse cerebral cortical atrophy, unchanged. No ventricular dilatation.  Osseous structures are normal.  IMPRESSION: No acute abnormality.   Original Report Authenticated By: Francene Boyers, M.D.    Ct Cervical Spine Wo Contrast  01/25/2013  *RADIOLOGY REPORT*  Clinical Data: Head and face trauma secondary to a fall today.  CT CERVICAL SPINE WITHOUT CONTRAST  Technique:  Multidetector CT imaging of the cervical spine was performed. Multiplanar CT image reconstructions were also generated.  Comparison: None.  Findings: There is no fracture, subluxation, or prevertebral soft tissue swelling.  The patient has severe facet arthritis at C3-4 on the left and to a lesser degree at C2-3  on the  left.  The left facet joint at C4-5 appears to be auto fused.  There is moderate degenerative disc disease at C4-5 and C5-6 and severe degenerative disc disease at C6-7.  IMPRESSION:  1.  No acute abnormality of the cervical spine. 2.  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   Original Report Authenticated By: Francene Boyers, M.D.      1. Fall, initial encounter   2. Abrasions of multiple sites  MDM  Patient with fall without loss of consciousness. Stumbled on the driveway scraped his nose hands and knees. Last tetanus was 7 years ago updated in ED today. Head CT neck CT negative. patient is on Coumadin current level of 1.5 is subtherapeutic. Will need followup with his primary care Dr.   Wounds cleaned in the emergency department wound care provided prior to discharge instructions take care of the wounds provided.        Shelda Jakes, MD 01/25/13 [phone]

## 2013-01-25 NOTE — ED Notes (Signed)
Pt here with complaint of tripping and falling earlier this morning.  Denies any L.O.C., pt with abrasions to both hands and bridge of nose.  Pt takes coumadin, but has no active external bleeding currently.

## 2013-07-21 DIAGNOSIS — M779 Enthesopathy, unspecified: Secondary | ICD-10-CM | POA: Insufficient documentation

## 2013-07-21 DIAGNOSIS — Z9189 Other specified personal risk factors, not elsewhere classified: Secondary | ICD-10-CM | POA: Insufficient documentation

## 2013-07-21 DIAGNOSIS — C4491 Basal cell carcinoma of skin, unspecified: Secondary | ICD-10-CM | POA: Insufficient documentation

## 2013-07-21 DIAGNOSIS — H9190 Unspecified hearing loss, unspecified ear: Secondary | ICD-10-CM | POA: Insufficient documentation

## 2013-10-18 DIAGNOSIS — C4359 Malignant melanoma of other part of trunk: Secondary | ICD-10-CM | POA: Insufficient documentation

## 2014-06-05 ENCOUNTER — Ambulatory Visit: Payer: Self-pay | Admitting: Podiatry

## 2014-07-27 DIAGNOSIS — R946 Abnormal results of thyroid function studies: Secondary | ICD-10-CM | POA: Insufficient documentation

## 2015-08-02 DIAGNOSIS — I8291 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: Secondary | ICD-10-CM | POA: Insufficient documentation

## 2015-08-02 DIAGNOSIS — D692 Other nonthrombocytopenic purpura: Secondary | ICD-10-CM | POA: Insufficient documentation

## 2015-08-02 DIAGNOSIS — E1121 Type 2 diabetes mellitus with diabetic nephropathy: Secondary | ICD-10-CM | POA: Insufficient documentation

## 2015-11-15 ENCOUNTER — Emergency Department (HOSPITAL_COMMUNITY)
Admission: EM | Admit: 2015-11-15 | Discharge: 2015-11-15 | Disposition: A | Discharge status: Home / Self Care | Payer: Medicare Other | Attending: Emergency Medicine | Admitting: Emergency Medicine

## 2015-11-15 ENCOUNTER — Emergency Department (HOSPITAL_COMMUNITY): Payer: Medicare Other

## 2015-11-15 ENCOUNTER — Encounter (HOSPITAL_COMMUNITY): Payer: Self-pay | Admitting: Emergency Medicine

## 2015-11-15 DIAGNOSIS — S80212A Abrasion, left knee, initial encounter: Secondary | ICD-10-CM | POA: Diagnosis not present

## 2015-11-15 DIAGNOSIS — Z8659 Personal history of other mental and behavioral disorders: Secondary | ICD-10-CM | POA: Diagnosis not present

## 2015-11-15 DIAGNOSIS — Y9389 Activity, other specified: Secondary | ICD-10-CM | POA: Insufficient documentation

## 2015-11-15 DIAGNOSIS — S80211A Abrasion, right knee, initial encounter: Secondary | ICD-10-CM | POA: Insufficient documentation

## 2015-11-15 DIAGNOSIS — Z86718 Personal history of other venous thrombosis and embolism: Secondary | ICD-10-CM | POA: Insufficient documentation

## 2015-11-15 DIAGNOSIS — Z79899 Other long term (current) drug therapy: Secondary | ICD-10-CM | POA: Diagnosis not present

## 2015-11-15 DIAGNOSIS — Y9289 Other specified places as the place of occurrence of the external cause: Secondary | ICD-10-CM | POA: Diagnosis not present

## 2015-11-15 DIAGNOSIS — I10 Essential (primary) hypertension: Secondary | ICD-10-CM | POA: Diagnosis not present

## 2015-11-15 DIAGNOSIS — S0031XA Abrasion of nose, initial encounter: Secondary | ICD-10-CM | POA: Diagnosis not present

## 2015-11-15 DIAGNOSIS — S0081XA Abrasion of other part of head, initial encounter: Secondary | ICD-10-CM | POA: Diagnosis not present

## 2015-11-15 DIAGNOSIS — Z7984 Long term (current) use of oral hypoglycemic drugs: Secondary | ICD-10-CM | POA: Insufficient documentation

## 2015-11-15 DIAGNOSIS — Z7901 Long term (current) use of anticoagulants: Secondary | ICD-10-CM | POA: Insufficient documentation

## 2015-11-15 DIAGNOSIS — T148 Other injury of unspecified body region: Secondary | ICD-10-CM | POA: Diagnosis not present

## 2015-11-15 DIAGNOSIS — Z87891 Personal history of nicotine dependence: Secondary | ICD-10-CM | POA: Diagnosis not present

## 2015-11-15 DIAGNOSIS — W1831XA Fall on same level due to stepping on an object, initial encounter: Secondary | ICD-10-CM | POA: Insufficient documentation

## 2015-11-15 DIAGNOSIS — Y998 Other external cause status: Secondary | ICD-10-CM | POA: Diagnosis not present

## 2015-11-15 DIAGNOSIS — Y9301 Activity, walking, marching and hiking: Secondary | ICD-10-CM | POA: Diagnosis not present

## 2015-11-15 DIAGNOSIS — T07XXXA Unspecified multiple injuries, initial encounter: Secondary | ICD-10-CM

## 2015-11-15 NOTE — ED Notes (Signed)
To ed via [facility] -- fell while walking to the mail box. States "my walker wasn't walking right!" pt had no LOC, no c/o pain except abrasions to bridge of nose and forehead.

## 2015-11-15 NOTE — ED Provider Notes (Signed)
CSN: [id number]     Arrival date & time 11/15/15  1310 History   First MD Initiated Contact with Patient 11/15/15 1316     Chief Complaint  Patient presents with  . Fall     HPI  Patient presents for evaluation of a fall. He was walking with his walker. He was trying to hurry before his family got home. He states he is not supposed to going out to get the mail that was. Minutes he was going too fast. He stepped onto the back of his walker and fell 4 striking his face on the concrete. No loss of consciousness. Has a whistle week each with him for such episodes. Blue the whistle. Help came. He presents here.  Past Medical History  Diagnosis Date  . DVT (deep venous thrombosis) ([facility])   . Hypertension   . Schizo affective schizophrenia ([place])    History reviewed. No pertinent past surgical history. No family history on file. Social History  Substance Use Topics  . Smoking status: Former Research scientist (life sciences)  . Smokeless tobacco: None  . Alcohol Use: No    Review of Systems  Constitutional: Negative for fever, chills, diaphoresis, appetite change and fatigue.  HENT: Negative for mouth sores, sore throat and trouble swallowing.        His comfort on his nose and forehead. Denies headache or neck pain. Land on both knees but has abrasions on both. States he is walking "fine".  Eyes: Negative for visual disturbance.  Respiratory: Negative for cough, chest tightness, shortness of breath and wheezing.   Cardiovascular: Negative for chest pain.  Gastrointestinal: Negative for nausea, vomiting, abdominal pain, diarrhea and abdominal distention.  Endocrine: Negative for polydipsia, polyphagia and polyuria.  Genitourinary: Negative for dysuria, frequency and hematuria.  Musculoskeletal: Negative for gait problem.  Skin: Negative for color change, pallor and rash.  Neurological: Negative for dizziness, syncope, light-headedness and headaches.  Hematological: Does not bruise/bleed easily.   Psychiatric/Behavioral: Negative for behavioral problems and confusion.      Allergies  Haloperidol lactate and Sildenafil  Home Medications   Prior to Admission medications   Medication Sig Start Date End Date Taking? Authorizing Provider  metoprolol (LOPRESSOR) 50 MG tablet Take 50 mg by mouth daily.   Yes Historical Provider, MD  warfarin (COUMADIN) 5 MG tablet Take 2.5-5 mg by mouth daily. Take 1 tablet ('5mg'$ ) on Mondays, Wednesdays and Fridays. Take 1/2 on all other days.   Yes Historical Provider, MD  atorvastatin (LIPITOR) 40 MG tablet  09/14/15   Historical Provider, MD  buPROPion (WELLBUTRIN) 100 MG tablet Take 100 mg by mouth 2 (two) times daily.    Historical Provider, MD  clonazePAM (KLONOPIN) 0.5 MG tablet Take 0.5 mg by mouth 2 (two) times daily as needed for anxiety.    Historical Provider, MD  fenofibrate 160 MG tablet Take 160 mg by mouth daily.    Historical Provider, MD  hydrochlorothiazide (HYDRODIURIL) 25 MG tablet Take 12.5 mg by mouth daily.    Historical Provider, MD  HYDROcodone-acetaminophen (NORCO/VICODIN) 5-325 MG per tablet Take 1 tablet by mouth every 6 (six) hours as needed for pain. 01/25/13   Fredia Sorrow, MD  ibuprofen (ADVIL,MOTRIN) 200 MG tablet Take 200 mg by mouth See admin instructions. Takes once every 4 or 5 days.    Historical Provider, MD  metFORMIN (GLUCOPHAGE) 500 MG tablet Take 500 mg by mouth daily with breakfast.    Historical Provider, MD  PRESCRIPTION MEDICATION Knee injections in both knees every four  to five months.    Historical Provider, MD  rosuvastatin (CRESTOR) 20 MG tablet Take 20 mg by mouth every evening.    Historical Provider, MD   BP 166/71 mmHg  Pulse 71  Temp(Src) 97.3 F (36.3 C) (Oral)  Resp 15  SpO2 99% Physical Exam  Constitutional: He is oriented to person, place, and time. He appears well-developed and well-nourished. No distress.  HENT:  Head: Normocephalic.  Abrasion to nasal bridge and forehead. No blood  over the TMs, mastoids, or from ears nose or mouth. A second movements intact. Normal V1 to V2 sensation. Normal dentition. No septal hematoma. No midline neck pain  Eyes: Conjunctivae are normal. Pupils are equal, round, and reactive to light. No scleral icterus.  Neck: Normal range of motion. Neck supple. No thyromegaly present.  Cardiovascular: Normal rate and regular rhythm.  Exam reveals no gallop and no friction rub.   No murmur heard. Pulmonary/Chest: Effort normal and breath sounds normal. No respiratory distress. He has no wheezes. He has no rales.  Abdominal: Soft. Bowel sounds are normal. He exhibits no distension. There is no tenderness. There is no rebound.  Musculoskeletal: Normal range of motion.  Neurological: He is alert and oriented to person, place, and time.  Skin: Skin is warm and dry. No rash noted.  Psychiatric: He has a normal mood and affect. His behavior is normal.    ED Course  Procedures (including critical care time) Labs Review Labs Reviewed - No data to display  Imaging Review Ct Head Wo Contrast  11/15/2015  CLINICAL DATA:  Facial abrasions after fall. EXAM: CT HEAD WITHOUT CONTRAST CT MAXILLOFACIAL WITHOUT CONTRAST CT CERVICAL SPINE WITHOUT CONTRAST TECHNIQUE: Multidetector CT imaging of the head, cervical spine, and maxillofacial structures were performed using the standard protocol without intravenous contrast. Multiplanar CT image reconstructions of the cervical spine and maxillofacial structures were also generated. COMPARISON:  CT scan of January 25, 2013. FINDINGS: CT HEAD FINDINGS Bony calvarium appears intact. Mild diffuse cortical atrophy is noted. No mass effect or midline shift is noted. Ventricular size is within normal limits. There is no evidence of mass lesion, hemorrhage or acute infarction. CT MAXILLOFACIAL FINDINGS No fracture or other bony abnormality is noted. Paranasal sinuses appear normal. Pterygoid plates appear normal. Globes and orbits  appear normal. CT CERVICAL SPINE FINDINGS No fracture is noted. Severe degenerative disc disease is noted at C5-6 with anterior and posterior osteophyte formation. Grade 1 retrolisthesis is noted at this level as well. Severe degenerative disc disease is also noted at C6-7 with anterior and posterior osteophyte formation. Significant at degenerative changes seen involving the posterior facet joints at C3-4, C4-5 and C5-6 on the left. Visualized lung apices appear normal. IMPRESSION: Mild diffuse cortical atrophy. No acute intracranial abnormality seen. No abnormality seen in the maxillofacial region. Multilevel severe degenerative disc disease is noted as well as other significant degenerative changes. No fracture is noted in the cervical spine. Electronically Signed   By: Marijo Conception, M.D.   On: 11/15/2015 15:03   Ct Cervical Spine Wo Contrast  11/15/2015  CLINICAL DATA:  Facial abrasions after fall. EXAM: CT HEAD WITHOUT CONTRAST CT MAXILLOFACIAL WITHOUT CONTRAST CT CERVICAL SPINE WITHOUT CONTRAST TECHNIQUE: Multidetector CT imaging of the head, cervical spine, and maxillofacial structures were performed using the standard protocol without intravenous contrast. Multiplanar CT image reconstructions of the cervical spine and maxillofacial structures were also generated. COMPARISON:  CT scan of January 25, 2013. FINDINGS: CT HEAD FINDINGS Bony calvarium appears  intact. Mild diffuse cortical atrophy is noted. No mass effect or midline shift is noted. Ventricular size is within normal limits. There is no evidence of mass lesion, hemorrhage or acute infarction. CT MAXILLOFACIAL FINDINGS No fracture or other bony abnormality is noted. Paranasal sinuses appear normal. Pterygoid plates appear normal. Globes and orbits appear normal. CT CERVICAL SPINE FINDINGS No fracture is noted. Severe degenerative disc disease is noted at C5-6 with anterior and posterior osteophyte formation. Grade 1 retrolisthesis is noted at  this level as well. Severe degenerative disc disease is also noted at C6-7 with anterior and posterior osteophyte formation. Significant at degenerative changes seen involving the posterior facet joints at C3-4, C4-5 and C5-6 on the left. Visualized lung apices appear normal. IMPRESSION: Mild diffuse cortical atrophy. No acute intracranial abnormality seen. No abnormality seen in the maxillofacial region. Multilevel severe degenerative disc disease is noted as well as other significant degenerative changes. No fracture is noted in the cervical spine. Electronically Signed   By: Marijo Conception, M.D.   On: 11/15/2015 15:03   Ct Maxillofacial Wo Cm  11/15/2015  CLINICAL DATA:  Facial abrasions after fall. EXAM: CT HEAD WITHOUT CONTRAST CT MAXILLOFACIAL WITHOUT CONTRAST CT CERVICAL SPINE WITHOUT CONTRAST TECHNIQUE: Multidetector CT imaging of the head, cervical spine, and maxillofacial structures were performed using the standard protocol without intravenous contrast. Multiplanar CT image reconstructions of the cervical spine and maxillofacial structures were also generated. COMPARISON:  CT scan of January 25, 2013. FINDINGS: CT HEAD FINDINGS Bony calvarium appears intact. Mild diffuse cortical atrophy is noted. No mass effect or midline shift is noted. Ventricular size is within normal limits. There is no evidence of mass lesion, hemorrhage or acute infarction. CT MAXILLOFACIAL FINDINGS No fracture or other bony abnormality is noted. Paranasal sinuses appear normal. Pterygoid plates appear normal. Globes and orbits appear normal. CT CERVICAL SPINE FINDINGS No fracture is noted. Severe degenerative disc disease is noted at C5-6 with anterior and posterior osteophyte formation. Grade 1 retrolisthesis is noted at this level as well. Severe degenerative disc disease is also noted at C6-7 with anterior and posterior osteophyte formation. Significant at degenerative changes seen involving the posterior facet joints at  C3-4, C4-5 and C5-6 on the left. Visualized lung apices appear normal. IMPRESSION: Mild diffuse cortical atrophy. No acute intracranial abnormality seen. No abnormality seen in the maxillofacial region. Multilevel severe degenerative disc disease is noted as well as other significant degenerative changes. No fracture is noted in the cervical spine. Electronically Signed   By: Marijo Conception, M.D.   On: 11/15/2015 15:03   I have personally reviewed and evaluated these images and lab results as part of my medical decision-making.   EKG Interpretation None      MDM   Final diagnoses:  Multiple contusions    Reassuring scans. Discharged home. Declines pain medication here.    Tanna Furry, MD 11/15/15 [phone]

## 2015-11-15 NOTE — Discharge Instructions (Signed)
Ice to injured areas.  Tylenol for pain.  Recheck with your primary care physician as needed

## 2015-11-15 NOTE — ED Notes (Signed)
Pt bandaged up with bacitracin and 2x2 gauze. Pt remain in position of comfort and waiting on discharge papers. Family bedside assisting pt getting dressed.

## 2016-01-02 DIAGNOSIS — B351 Tinea unguium: Secondary | ICD-10-CM | POA: Insufficient documentation

## 2016-01-02 DIAGNOSIS — R42 Dizziness and giddiness: Secondary | ICD-10-CM | POA: Insufficient documentation

## 2016-11-06 DIAGNOSIS — M6281 Muscle weakness (generalized): Secondary | ICD-10-CM | POA: Insufficient documentation

## 2017-02-05 DIAGNOSIS — R627 Adult failure to thrive: Secondary | ICD-10-CM | POA: Insufficient documentation

## 2017-02-05 DIAGNOSIS — R269 Unspecified abnormalities of gait and mobility: Secondary | ICD-10-CM | POA: Insufficient documentation

## 2017-08-10 DIAGNOSIS — H04129 Dry eye syndrome of unspecified lacrimal gland: Secondary | ICD-10-CM | POA: Insufficient documentation

## 2017-10-06 DIAGNOSIS — H02832 Dermatochalasis of right lower eyelid: Secondary | ICD-10-CM | POA: Insufficient documentation

## 2017-10-06 DIAGNOSIS — H02831 Dermatochalasis of right upper eyelid: Secondary | ICD-10-CM | POA: Insufficient documentation

## 2017-12-10 MED FILL — CEFDINIR 300 MG CAPSULE: 300 | 10 days supply | Qty: 20 | Fill #0

## 2017-12-18 DIAGNOSIS — D692 Other nonthrombocytopenic purpura: Secondary | ICD-10-CM | POA: Diagnosis not present

## 2017-12-18 DIAGNOSIS — L570 Actinic keratosis: Secondary | ICD-10-CM | POA: Diagnosis not present

## 2017-12-18 DIAGNOSIS — Z85828 Personal history of other malignant neoplasm of skin: Secondary | ICD-10-CM | POA: Diagnosis not present

## 2017-12-18 DIAGNOSIS — L821 Other seborrheic keratosis: Secondary | ICD-10-CM | POA: Diagnosis not present

## 2017-12-18 DIAGNOSIS — D1801 Hemangioma of skin and subcutaneous tissue: Secondary | ICD-10-CM | POA: Diagnosis not present

## 2017-12-18 DIAGNOSIS — D2262 Melanocytic nevi of left upper limb, including shoulder: Secondary | ICD-10-CM | POA: Diagnosis not present

## 2017-12-18 DIAGNOSIS — Z85820 Personal history of malignant melanoma of skin: Secondary | ICD-10-CM | POA: Diagnosis not present

## 2017-12-18 DIAGNOSIS — D2261 Melanocytic nevi of right upper limb, including shoulder: Secondary | ICD-10-CM | POA: Diagnosis not present

## 2017-12-18 DIAGNOSIS — L812 Freckles: Secondary | ICD-10-CM | POA: Diagnosis not present

## 2018-01-04 MED FILL — FENOFIBRATE 160 MG TABLET: 160 | 90 days supply | Qty: 90 | Fill #0 | Status: TO

## 2018-01-05 DIAGNOSIS — I8291 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: Secondary | ICD-10-CM | POA: Diagnosis not present

## 2018-01-05 DIAGNOSIS — Z7901 Long term (current) use of anticoagulants: Secondary | ICD-10-CM | POA: Diagnosis not present

## 2018-01-11 DIAGNOSIS — H6123 Impacted cerumen, bilateral: Secondary | ICD-10-CM | POA: Diagnosis not present

## 2018-02-16 DIAGNOSIS — D692 Other nonthrombocytopenic purpura: Secondary | ICD-10-CM | POA: Diagnosis not present

## 2018-02-16 DIAGNOSIS — F3289 Other specified depressive episodes: Secondary | ICD-10-CM | POA: Diagnosis not present

## 2018-02-16 DIAGNOSIS — E668 Other obesity: Secondary | ICD-10-CM | POA: Diagnosis not present

## 2018-02-16 DIAGNOSIS — E1122 Type 2 diabetes mellitus with diabetic chronic kidney disease: Secondary | ICD-10-CM | POA: Diagnosis not present

## 2018-02-16 DIAGNOSIS — R627 Adult failure to thrive: Secondary | ICD-10-CM | POA: Diagnosis not present

## 2018-02-16 DIAGNOSIS — Z6833 Body mass index (BMI) 33.0-33.9, adult: Secondary | ICD-10-CM | POA: Diagnosis not present

## 2018-02-16 DIAGNOSIS — I129 Hypertensive chronic kidney disease with stage 1 through stage 4 chronic kidney disease, or unspecified chronic kidney disease: Secondary | ICD-10-CM | POA: Diagnosis not present

## 2018-02-16 DIAGNOSIS — F209 Schizophrenia, unspecified: Secondary | ICD-10-CM | POA: Diagnosis not present

## 2018-02-16 DIAGNOSIS — N183 Chronic kidney disease, stage 3 (moderate): Secondary | ICD-10-CM | POA: Diagnosis not present

## 2018-02-16 DIAGNOSIS — D751 Secondary polycythemia: Secondary | ICD-10-CM | POA: Diagnosis not present

## 2018-02-16 DIAGNOSIS — Z7901 Long term (current) use of anticoagulants: Secondary | ICD-10-CM | POA: Diagnosis not present

## 2018-02-16 DIAGNOSIS — I8291 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: Secondary | ICD-10-CM | POA: Diagnosis not present

## 2018-02-25 DIAGNOSIS — Z7901 Long term (current) use of anticoagulants: Secondary | ICD-10-CM | POA: Diagnosis not present

## 2018-03-16 DIAGNOSIS — F3181 Bipolar II disorder: Secondary | ICD-10-CM | POA: Diagnosis not present

## 2018-03-31 DIAGNOSIS — Z7901 Long term (current) use of anticoagulants: Secondary | ICD-10-CM | POA: Diagnosis not present

## 2018-03-31 DIAGNOSIS — I8291 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: Secondary | ICD-10-CM | POA: Diagnosis not present

## 2018-05-03 DIAGNOSIS — L570 Actinic keratosis: Secondary | ICD-10-CM | POA: Diagnosis not present

## 2018-05-03 DIAGNOSIS — L812 Freckles: Secondary | ICD-10-CM | POA: Diagnosis not present

## 2018-05-03 DIAGNOSIS — L821 Other seborrheic keratosis: Secondary | ICD-10-CM | POA: Diagnosis not present

## 2018-05-03 DIAGNOSIS — C4359 Malignant melanoma of other part of trunk: Secondary | ICD-10-CM | POA: Diagnosis not present

## 2018-05-03 DIAGNOSIS — Z85828 Personal history of other malignant neoplasm of skin: Secondary | ICD-10-CM | POA: Diagnosis not present

## 2018-05-03 DIAGNOSIS — Z85820 Personal history of malignant melanoma of skin: Secondary | ICD-10-CM | POA: Diagnosis not present

## 2018-05-03 DIAGNOSIS — D485 Neoplasm of uncertain behavior of skin: Secondary | ICD-10-CM | POA: Diagnosis not present

## 2018-05-06 DIAGNOSIS — H26492 Other secondary cataract, left eye: Secondary | ICD-10-CM | POA: Diagnosis not present

## 2018-05-06 DIAGNOSIS — H10503 Unspecified blepharoconjunctivitis, bilateral: Secondary | ICD-10-CM | POA: Diagnosis not present

## 2018-05-06 DIAGNOSIS — H524 Presbyopia: Secondary | ICD-10-CM | POA: Diagnosis not present

## 2018-05-06 DIAGNOSIS — E119 Type 2 diabetes mellitus without complications: Secondary | ICD-10-CM | POA: Diagnosis not present

## 2018-05-06 DIAGNOSIS — H04123 Dry eye syndrome of bilateral lacrimal glands: Secondary | ICD-10-CM | POA: Diagnosis not present

## 2018-05-12 DIAGNOSIS — Z7901 Long term (current) use of anticoagulants: Secondary | ICD-10-CM | POA: Diagnosis not present

## 2018-05-12 DIAGNOSIS — I8291 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: Secondary | ICD-10-CM | POA: Diagnosis not present

## 2018-05-20 DIAGNOSIS — Z85820 Personal history of malignant melanoma of skin: Secondary | ICD-10-CM | POA: Diagnosis not present

## 2018-05-20 DIAGNOSIS — Z85828 Personal history of other malignant neoplasm of skin: Secondary | ICD-10-CM | POA: Diagnosis not present

## 2018-05-20 DIAGNOSIS — C4359 Malignant melanoma of other part of trunk: Secondary | ICD-10-CM | POA: Diagnosis not present

## 2018-05-27 DIAGNOSIS — H26491 Other secondary cataract, right eye: Secondary | ICD-10-CM | POA: Diagnosis not present

## 2018-06-07 DIAGNOSIS — H903 Sensorineural hearing loss, bilateral: Secondary | ICD-10-CM | POA: Diagnosis not present

## 2018-06-28 DIAGNOSIS — H903 Sensorineural hearing loss, bilateral: Secondary | ICD-10-CM | POA: Diagnosis not present

## 2018-08-09 DIAGNOSIS — Z125 Encounter for screening for malignant neoplasm of prostate: Secondary | ICD-10-CM | POA: Diagnosis not present

## 2018-08-09 DIAGNOSIS — E7849 Other hyperlipidemia: Secondary | ICD-10-CM | POA: Diagnosis not present

## 2018-08-09 DIAGNOSIS — N183 Chronic kidney disease, stage 3 (moderate): Secondary | ICD-10-CM | POA: Diagnosis not present

## 2018-08-09 DIAGNOSIS — R82998 Other abnormal findings in urine: Secondary | ICD-10-CM | POA: Diagnosis not present

## 2018-08-09 DIAGNOSIS — E1122 Type 2 diabetes mellitus with diabetic chronic kidney disease: Secondary | ICD-10-CM | POA: Diagnosis not present

## 2018-08-09 DIAGNOSIS — R946 Abnormal results of thyroid function studies: Secondary | ICD-10-CM | POA: Diagnosis not present

## 2018-08-16 DIAGNOSIS — Z6834 Body mass index (BMI) 34.0-34.9, adult: Secondary | ICD-10-CM | POA: Diagnosis not present

## 2018-08-16 DIAGNOSIS — F3289 Other specified depressive episodes: Secondary | ICD-10-CM | POA: Diagnosis not present

## 2018-08-16 DIAGNOSIS — Z7901 Long term (current) use of anticoagulants: Secondary | ICD-10-CM | POA: Diagnosis not present

## 2018-08-16 DIAGNOSIS — Z0000 Encounter for general adult medical examination without abnormal findings: Secondary | ICD-10-CM | POA: Diagnosis not present

## 2018-08-16 DIAGNOSIS — F2089 Other schizophrenia: Secondary | ICD-10-CM | POA: Diagnosis not present

## 2018-08-16 DIAGNOSIS — F1320 Sedative, hypnotic or anxiolytic dependence, uncomplicated: Secondary | ICD-10-CM | POA: Diagnosis not present

## 2018-08-16 DIAGNOSIS — D692 Other nonthrombocytopenic purpura: Secondary | ICD-10-CM | POA: Diagnosis not present

## 2018-08-16 DIAGNOSIS — I129 Hypertensive chronic kidney disease with stage 1 through stage 4 chronic kidney disease, or unspecified chronic kidney disease: Secondary | ICD-10-CM | POA: Diagnosis not present

## 2018-08-16 DIAGNOSIS — E1122 Type 2 diabetes mellitus with diabetic chronic kidney disease: Secondary | ICD-10-CM | POA: Diagnosis not present

## 2018-08-16 DIAGNOSIS — R569 Unspecified convulsions: Secondary | ICD-10-CM | POA: Diagnosis not present

## 2018-08-16 DIAGNOSIS — E7849 Other hyperlipidemia: Secondary | ICD-10-CM | POA: Diagnosis not present

## 2018-08-16 DIAGNOSIS — I8291 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: Secondary | ICD-10-CM | POA: Diagnosis not present

## 2018-08-18 DIAGNOSIS — Z1212 Encounter for screening for malignant neoplasm of rectum: Secondary | ICD-10-CM | POA: Diagnosis not present

## 2018-09-15 DIAGNOSIS — H6123 Impacted cerumen, bilateral: Secondary | ICD-10-CM | POA: Diagnosis not present

## 2018-09-15 DIAGNOSIS — E1122 Type 2 diabetes mellitus with diabetic chronic kidney disease: Secondary | ICD-10-CM | POA: Diagnosis not present

## 2018-09-15 DIAGNOSIS — Z7901 Long term (current) use of anticoagulants: Secondary | ICD-10-CM | POA: Diagnosis not present

## 2018-09-15 DIAGNOSIS — I8291 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: Secondary | ICD-10-CM | POA: Diagnosis not present

## 2018-10-06 DIAGNOSIS — Z23 Encounter for immunization: Secondary | ICD-10-CM | POA: Diagnosis not present

## 2018-10-20 DIAGNOSIS — F3181 Bipolar II disorder: Secondary | ICD-10-CM | POA: Diagnosis not present

## 2018-10-26 DIAGNOSIS — M545 Low back pain: Secondary | ICD-10-CM | POA: Diagnosis not present

## 2018-10-26 DIAGNOSIS — Z9189 Other specified personal risk factors, not elsewhere classified: Secondary | ICD-10-CM | POA: Diagnosis not present

## 2018-10-26 DIAGNOSIS — Z7901 Long term (current) use of anticoagulants: Secondary | ICD-10-CM | POA: Diagnosis not present

## 2018-10-26 DIAGNOSIS — I8291 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: Secondary | ICD-10-CM | POA: Diagnosis not present

## 2018-11-01 DIAGNOSIS — L821 Other seborrheic keratosis: Secondary | ICD-10-CM | POA: Diagnosis not present

## 2018-11-01 DIAGNOSIS — D1801 Hemangioma of skin and subcutaneous tissue: Secondary | ICD-10-CM | POA: Diagnosis not present

## 2018-11-01 DIAGNOSIS — Z85820 Personal history of malignant melanoma of skin: Secondary | ICD-10-CM | POA: Diagnosis not present

## 2018-11-01 DIAGNOSIS — Z85828 Personal history of other malignant neoplasm of skin: Secondary | ICD-10-CM | POA: Diagnosis not present

## 2018-11-01 DIAGNOSIS — D692 Other nonthrombocytopenic purpura: Secondary | ICD-10-CM | POA: Diagnosis not present

## 2018-11-01 DIAGNOSIS — L570 Actinic keratosis: Secondary | ICD-10-CM | POA: Diagnosis not present

## 2018-11-26 DIAGNOSIS — I8291 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: Secondary | ICD-10-CM | POA: Diagnosis not present

## 2018-11-26 DIAGNOSIS — J189 Pneumonia, unspecified organism: Secondary | ICD-10-CM | POA: Diagnosis not present

## 2018-11-26 DIAGNOSIS — Z7901 Long term (current) use of anticoagulants: Secondary | ICD-10-CM | POA: Diagnosis not present

## 2018-11-26 DIAGNOSIS — R918 Other nonspecific abnormal finding of lung field: Secondary | ICD-10-CM | POA: Diagnosis not present

## 2018-11-26 DIAGNOSIS — Z6833 Body mass index (BMI) 33.0-33.9, adult: Secondary | ICD-10-CM | POA: Diagnosis not present

## 2018-12-06 ENCOUNTER — Emergency Department (HOSPITAL_COMMUNITY): Payer: PPO

## 2018-12-06 ENCOUNTER — Emergency Department (HOSPITAL_COMMUNITY)
Admission: EM | Admit: 2018-12-06 | Discharge: 2018-12-06 | Disposition: A | Discharge status: Home / Self Care | Payer: PPO | Attending: Emergency Medicine | Admitting: Emergency Medicine

## 2018-12-06 ENCOUNTER — Other Ambulatory Visit: Payer: Self-pay

## 2018-12-06 ENCOUNTER — Encounter (HOSPITAL_COMMUNITY): Payer: Self-pay

## 2018-12-06 DIAGNOSIS — Z7901 Long term (current) use of anticoagulants: Secondary | ICD-10-CM | POA: Diagnosis not present

## 2018-12-06 DIAGNOSIS — J181 Lobar pneumonia, unspecified organism: Secondary | ICD-10-CM

## 2018-12-06 DIAGNOSIS — Z87891 Personal history of nicotine dependence: Secondary | ICD-10-CM | POA: Diagnosis not present

## 2018-12-06 DIAGNOSIS — Z79899 Other long term (current) drug therapy: Secondary | ICD-10-CM | POA: Diagnosis not present

## 2018-12-06 DIAGNOSIS — Z4659 Encounter for fitting and adjustment of other gastrointestinal appliance and device: Secondary | ICD-10-CM

## 2018-12-06 DIAGNOSIS — Z7982 Long term (current) use of aspirin: Secondary | ICD-10-CM | POA: Diagnosis not present

## 2018-12-06 DIAGNOSIS — M7918 Myalgia, other site: Secondary | ICD-10-CM | POA: Diagnosis present

## 2018-12-06 DIAGNOSIS — I10 Essential (primary) hypertension: Secondary | ICD-10-CM | POA: Diagnosis not present

## 2018-12-06 DIAGNOSIS — R531 Weakness: Secondary | ICD-10-CM | POA: Diagnosis not present

## 2018-12-06 DIAGNOSIS — R05 Cough: Secondary | ICD-10-CM | POA: Diagnosis not present

## 2018-12-06 DIAGNOSIS — W19XXXA Unspecified fall, initial encounter: Secondary | ICD-10-CM | POA: Diagnosis not present

## 2018-12-06 DIAGNOSIS — R918 Other nonspecific abnormal finding of lung field: Secondary | ICD-10-CM | POA: Diagnosis not present

## 2018-12-06 DIAGNOSIS — R5383 Other fatigue: Secondary | ICD-10-CM | POA: Diagnosis not present

## 2018-12-06 DIAGNOSIS — J189 Pneumonia, unspecified organism: Secondary | ICD-10-CM | POA: Diagnosis not present

## 2018-12-06 DIAGNOSIS — R0602 Shortness of breath: Secondary | ICD-10-CM | POA: Diagnosis not present

## 2018-12-06 LAB — CBC WITH DIFFERENTIAL/PLATELET
ABS IMMATURE GRANULOCYTES: 0.13 10*3/uL — AB (ref 0.00–0.07)
Basophils Absolute: 0.1 10*3/uL (ref 0.0–0.1)
Basophils Relative: 0 %
EOS ABS: 0.1 10*3/uL (ref 0.0–0.5)
Eosinophils Relative: 1 %
HEMATOCRIT: 52.4 % — AB (ref 39.0–52.0)
HEMOGLOBIN: 16.5 g/dL (ref 13.0–17.0)
IMMATURE GRANULOCYTES: 1 %
LYMPHS ABS: 1.6 10*3/uL (ref 0.7–4.0)
LYMPHS PCT: 12 %
MCH: 29.6 pg (ref 26.0–34.0)
MCHC: 31.5 g/dL (ref 30.0–36.0)
MCV: 93.9 fL (ref 80.0–100.0)
MONOS PCT: 10 %
Monocytes Absolute: 1.3 10*3/uL — ABNORMAL HIGH (ref 0.1–1.0)
NEUTROS ABS: 10.2 10*3/uL — AB (ref 1.7–7.7)
Neutrophils Relative %: 76 %
Platelets: 283 10*3/uL (ref 150–400)
RBC: 5.58 MIL/uL (ref 4.22–5.81)
RDW: 14.8 % (ref 11.5–15.5)
WBC: 13.4 10*3/uL — ABNORMAL HIGH (ref 4.0–10.5)
nRBC: 0 % (ref 0.0–0.2)

## 2018-12-06 LAB — BASIC METABOLIC PANEL
ANION GAP: 9 (ref 5–15)
BUN: 17 mg/dL (ref 8–23)
CO2: 25 mmol/L (ref 22–32)
Calcium: 9.5 mg/dL (ref 8.9–10.3)
Chloride: 103 mmol/L (ref 98–111)
Creatinine, Ser: 1.35 mg/dL — ABNORMAL HIGH (ref 0.61–1.24)
GFR calc Af Amer: 56 mL/min — ABNORMAL LOW (ref >60)
GFR calc non Af Amer: 49 mL/min — ABNORMAL LOW (ref >60)
GLUCOSE: 174 mg/dL — AB (ref 70–99)
Potassium: 3.8 mmol/L (ref 3.5–5.1)
Sodium: 137 mmol/L (ref 135–145)

## 2018-12-06 LAB — PROTIME-INR
INR: 2.27
Prothrombin Time: 24.7 seconds — ABNORMAL HIGH (ref 11.4–15.2)

## 2018-12-06 MED ORDER — SODIUM CHLORIDE 0.9 % IV BOLUS
1000.0000 mL | Freq: Once | INTRAVENOUS | Status: AC
  Administered 2018-12-06: 1000 mL via INTRAVENOUS

## 2018-12-06 MED ORDER — DOXYCYCLINE HYCLATE 100 MG PO CAPS: 100.0000 mg | ORAL_CAPSULE | Freq: Two times a day (BID) | ORAL | 0 refills | Status: AC

## 2018-12-06 MED ORDER — BENZONATATE 100 MG PO CAPS: 100.0000 mg | ORAL_CAPSULE | Freq: Three times a day (TID) | ORAL | 0 refills | Status: DC

## 2018-12-06 NOTE — ED Notes (Signed)
Bed: WA01 Expected date:  Expected time:  Means of arrival:  Comments: EMS 44M generalized weakness/fall

## 2018-12-06 NOTE — ED Provider Notes (Signed)
[facility] DEPT Provider Note   CSN: [id number] Arrival date & time: 12/06/18  1227     History   Chief Complaint Chief Complaint  Patient presents with  . Generalized Body Aches    HPI Robert Fisher is a 85 y.o. male past medical history of DVT, hypertension who presents for evaluation of generalized body aches, generalized weakness and shortness of breath sent over by PCP for evaluation.  Patient recently diagnosed with pneumonia on 11/25/18.  He was given an antibiotic at that time which he states he has been taking.  Unfortunately, neither him nor his wife remember the name of the antibiotic.  Wife states that he took the antibiotic but does need to have cough and generalized weakness.  She went to follow-up with PCP today and was sent to go to the emergency department instead.  Patient states that he has been still coughing and states that cough is productive of some green and yellow phlegm.  Patient states that he has not had shortness of breath.  He states he has had nasal congestion and rhinorrhea.  He reports he has been able to eat and drink without any difficulty.  Patient also sustained a fall earlier today earlier today.  He states that he was attempting to walk with his walker and he thought his wife was holding onto it but she was not.  This caused him to slip backwards and landed on his way.  He states did not hit his head or have any LOC.  He is currently on blood thinners.  He denies any injury from the fall.   The history is provided by the patient.    Past Medical History:  Diagnosis Date  . DVT (deep venous thrombosis) ([place])   . Hypertension   . Schizo affective schizophrenia [facility])     Patient Active Problem List   Diagnosis Date Noted  . ADVEF, DRUG/MEDICINAL/BIOLOGICAL SUBST NOS 08/30/2007  . HYPERLIPIDEMIA NEC/NOS 06/08/2007  . SCHIZOPHRENIA, PARANOID, UNSPECIFIED 03/19/2007  . PERIPHERAL VASCULAR DISEASE 03/19/2007  . SKIN  CANCER, HX OF 03/19/2007  . PULMONARY EMBOLISM, HX OF 03/19/2007    History reviewed. No pertinent surgical history.      Home Medications    Prior to Admission medications   Medication Sig Start Date End Date Taking? Authorizing Provider  aspirin EC 81 MG tablet Take 81 mg by mouth daily.   Yes [provider]  atorvastatin (LIPITOR) 40 MG tablet Take 40 mg by mouth daily.  09/14/15  Yes [provider]  buPROPion (WELLBUTRIN) 100 MG tablet Take 100 mg by mouth 2 (two) times daily.   Yes [provider]  clonazePAM (KLONOPIN) 0.5 MG tablet Take 0.5 mg by mouth 2 (two) times daily as needed for anxiety.   Yes [provider]  fenofibrate 160 MG tablet Take 160 mg by mouth daily.   Yes [provider]  hydrochlorothiazide (HYDRODIURIL) 25 MG tablet Take 12.5 mg by mouth daily.   Yes [provider]  metFORMIN (GLUCOPHAGE) 500 MG tablet Take 500 mg by mouth daily with breakfast.   Yes [provider]  metoprolol (LOPRESSOR) 50 MG tablet Take 50 mg by mouth daily.   Yes [provider]  OLANZapine (ZYPREXA) 10 MG tablet Take 10 mg by mouth at bedtime.   Yes [provider]  warfarin (COUMADIN) 5 MG tablet Take 2.5-5 mg by mouth daily. Take 1 tablet (5mg ) on Mondays, Wednesdays, Fridays, and take 1/2 tablet  (  2.5mg ) on Tuesday, Thursday, Saturday and Sunday   Yes [provider]  benzonatate (TESSALON) 100 MG capsule Take 1 capsule (100 mg total) by mouth every 8 (eight) hours. 12/06/18   Volanda Napoleon, PA-C  doxycycline (VIBRAMYCIN) 100 MG capsule Take 1 capsule (100 mg total) by mouth 2 (two) times daily for 7 days. 12/06/18 12/13/18  Volanda Napoleon, PA-C  HYDROcodone-acetaminophen (NORCO/VICODIN) 5-325 MG per tablet Take 1 tablet by mouth every 6 (six) hours as needed for pain. Patient not taking: Reported on 12/06/2018 01/25/13   Fredia Sorrow, MD    Family History History reviewed. No pertinent  family history.  Social History Social History   Tobacco Use  . Smoking status: Former Research scientist (life sciences)  . Smokeless tobacco: Never Used  Substance Use Topics  . Alcohol use: No  . Drug use: No     Allergies   Haloperidol lactate and Sildenafil   Review of Systems Review of Systems  Constitutional: Positive for fatigue. Negative for fever.  Respiratory: Positive for cough and shortness of breath.   Cardiovascular: Negative for chest pain.  Gastrointestinal: Negative for abdominal pain, nausea and vomiting.  Genitourinary: Negative for dysuria and hematuria.  Musculoskeletal: Positive for myalgias.  Neurological: Negative for headaches.  All other systems reviewed and are negative.    Physical Exam Updated Vital Signs BP 134/67 (BP Location: Right Arm)   Pulse 70   Temp 97.8 F (36.6 C) (Oral)   Resp 16   SpO2 96%   Physical Exam Vitals signs and nursing note reviewed.  Constitutional:      Appearance: Normal appearance. He is well-developed.  HENT:     Head: Normocephalic and atraumatic.     Comments: No tenderness to palpation of skull. No deformities or crepitus noted. No open wounds, abrasions or lacerations.  Eyes:     General: Lids are normal.     Conjunctiva/sclera: Conjunctivae normal.     Pupils: Pupils are equal, round, and reactive to light.  Neck:     Musculoskeletal: Full passive range of motion without pain.  Cardiovascular:     Rate and Rhythm: Normal rate and regular rhythm.     Pulses: Normal pulses.     Heart sounds: Normal heart sounds. No murmur. No friction rub. No gallop.   Pulmonary:     Effort: Pulmonary effort is normal.     Breath sounds: Rhonchi present.     Comments: Mild rhonchi noted.  Intermittent cough.   Able to speak in full sentences without any difficulty.  No evidence of respiratory distress. Abdominal:     Palpations: Abdomen is soft. Abdomen is not rigid.     Tenderness: There is no abdominal tenderness. There is no guarding.    Musculoskeletal: Normal range of motion.     Comments: BLE are symmetric in appearance without any overlying warmth, erythema, edema.  Skin:    General: Skin is warm and dry.     Capillary Refill: Capillary refill takes less than 2 seconds.  Neurological:     Mental Status: He is alert and oriented to person, place, and time.  Psychiatric:        Speech: Speech normal.      ED Treatments / Results  Labs (all labs ordered are listed, but only abnormal results are displayed) Labs Reviewed  CBC WITH DIFFERENTIAL/PLATELET - Abnormal; Notable for the following components:      Result Value   WBC 13.4 (*)    HCT 52.4 (*)  Neutro Abs 10.2 (*)    Monocytes Absolute 1.3 (*)    Abs Immature Granulocytes 0.13 (*)    All other components within normal limits  BASIC METABOLIC PANEL - Abnormal; Notable for the following components:   Glucose, Bld 174 (*)    Creatinine, Ser 1.35 (*)    GFR calc non Af Amer 49 (*)    GFR calc Af Amer 56 (*)    All other components within normal limits  PROTIME-INR - Abnormal; Notable for the following components:   Prothrombin Time 24.7 (*)    All other components within normal limits    EKG None  Radiology Dg Chest 2 View  Result Date: 12/06/2018 CLINICAL DATA:  Cough and weakness EXAM: CHEST - 2 VIEW COMPARISON:  None. FINDINGS: Normal heart size. Normal mediastinal contour. No pneumothorax. No pleural effusion. Mild patchy opacity in the upper left lung. IMPRESSION: Mild patchy upper left lung opacity, which could represent a mild pneumonia. Recommend follow-up PA and lateral post treatment chest radiographs in 4-6 weeks. Electronically Signed   By: Ilona Sorrel M.D.   On: 12/06/2018 14:50    Procedures Procedures (including critical care time)  Medications Ordered in ED Medications  sodium chloride 0.9 % bolus 1,000 mL (0 mLs Intravenous Stopped 12/06/18 1456)     Initial Impression / Assessment and Plan / ED Course  I have reviewed the  triage vital signs and the nursing notes.  Pertinent labs & imaging results that were available during my care of the patient were reviewed by me and considered in my medical decision making (see chart for details).     85 year old male who presents for evaluation of continued cough.  Recently diagnosed with pneumonia and was taking antibiotic of which the wife does not know the name of.  Comes in today because he is continued to have weakness and cough.  Triage note mentions that patient was having shortness of breath with exertion.  I discussed with both patient and wife and they deny any SOP.  Wife states that the main issue has been patient's cough as he is continued to have cough.  She had called the PCP today to see if she could get more antibiotics but instead instructed to come to the ED for further evaluation.  Additionally, patient had a fall that involved wife earlier today.  No head injury, LOC.  He is on blood thinners. Patient is afebrile, non-toxic appearing, sitting comfortably on examination table.  Signs reviewed and stable.  Patient with no evidence of hypoxia.  We will plan to check basic labs.  BMP shows creatinine at 1.35.  CBC shows leukocytosis of 13.4.  INR is 2.27.  He has had a bump in his creatinine before but most recent blood work is from approximately 8 years ago.  This x-ray shows mild patchy upper left lung opacity that could that mild pneumonia.  Unfortunately do not have access to previous chest x-ray that was used to diagnosis pneumonia.  This could be residual pneumonia or improvement from previous.  At this time, do not feel that patient symptoms are result of PE.  He exhibits no hypoxia or tachycardia here in the ED.  Additionally, he is on Coumadin and INR is 2.27.  When I discussed with wife, she said the main issue was his persistent continual cough.  Discussed patient with Dr. Francia Greaves.  She has not hypoxic here in the ED and labs are reassuring.  At this time, I  do not  feel the patient needs to be admitted for IV antibiotics.  I have called pharmacy and noted that he had previously been on Levaquin.  We will switch him to doxycycline and is sure good follow-up with PCP.  I discussed with pharmacy regarding doxycycline and Coumadin.  They state that it should be safe for him to take as it is 1 of the antibiotics that cause less effective Coumadin.  Plan to ambulate patient for evaluation of pulse ox.  Patient able to ambulate in the ED without any difficulty.  Pulse ox remained 96% on room air.  No evidence of hypoxia. At this time, patient exhibits no emergent life-threatening condition that require further evaluation in ED or admission.  Will switch antibiotic.  Instructed patient to follow-up with primary care as directed. Patient had ample opportunity for questions and discussion. All patient's questions were answered with full understanding. Strict return precautions discussed. Patient expresses understanding and agreement to plan.   Portions of this note were generated with [employer]. Dictation errors may occur despite best attempts at proofreading.   Final Clinical Impressions(s) / ED Diagnoses   Final diagnoses:  Community acquired pneumonia of left upper lobe of lung [facility])    ED Discharge Orders         Ordered    doxycycline (VIBRAMYCIN) 100 MG capsule  2 times daily     12/06/18 1655    benzonatate (TESSALON) 100 MG capsule  Every 8 hours     12/06/18 1655           Desma Mcgregor 12/06/18 1736    Valarie Merino, MD 12/13/18 [phone]

## 2018-12-06 NOTE — ED Triage Notes (Signed)
Pt arrived via EMS from home. Pt referred by PCP to r/o pneumonia. Pt having generalized weakness, and SOB with exertion. Pt had a mechanical fall with walker and collided and fell on spouse while attempting to come to the ED. Pt denies pain and no obvious injuries, no lOC. Pt wife has check in a well.   Pt is on coumadin  EMS b/p 130/78, HR 72, RR 18, CBG 203

## 2018-12-06 NOTE — ED Notes (Signed)
96% while ambulating

## 2018-12-06 NOTE — Discharge Instructions (Signed)
Take antibiotics as directed. Please take all of your antibiotics until finished.  Use Tessalon Perles for cough.  Follow-up with your primary care doctor either the end of this week or early next week.  Return the emergency department for difficulty breathing, numbing, chest pain or any other worsening or concerning symptoms.

## 2018-12-14 DIAGNOSIS — Z6833 Body mass index (BMI) 33.0-33.9, adult: Secondary | ICD-10-CM | POA: Diagnosis not present

## 2018-12-14 DIAGNOSIS — H6123 Impacted cerumen, bilateral: Secondary | ICD-10-CM | POA: Diagnosis not present

## 2018-12-14 DIAGNOSIS — Z7901 Long term (current) use of anticoagulants: Secondary | ICD-10-CM | POA: Diagnosis not present

## 2018-12-14 DIAGNOSIS — J189 Pneumonia, unspecified organism: Secondary | ICD-10-CM | POA: Diagnosis not present

## 2018-12-14 DIAGNOSIS — I8291 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: Secondary | ICD-10-CM | POA: Diagnosis not present

## 2018-12-14 DIAGNOSIS — I10 Essential (primary) hypertension: Secondary | ICD-10-CM | POA: Diagnosis not present

## 2019-01-11 DIAGNOSIS — J189 Pneumonia, unspecified organism: Secondary | ICD-10-CM | POA: Diagnosis not present

## 2019-01-11 DIAGNOSIS — Z7901 Long term (current) use of anticoagulants: Secondary | ICD-10-CM | POA: Diagnosis not present

## 2019-01-11 DIAGNOSIS — I129 Hypertensive chronic kidney disease with stage 1 through stage 4 chronic kidney disease, or unspecified chronic kidney disease: Secondary | ICD-10-CM | POA: Diagnosis not present

## 2019-01-11 DIAGNOSIS — N183 Chronic kidney disease, stage 3 (moderate): Secondary | ICD-10-CM | POA: Diagnosis not present

## 2019-01-11 DIAGNOSIS — R918 Other nonspecific abnormal finding of lung field: Secondary | ICD-10-CM | POA: Diagnosis not present

## 2019-01-11 DIAGNOSIS — I8291 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: Secondary | ICD-10-CM | POA: Diagnosis not present

## 2019-01-11 DIAGNOSIS — Z6833 Body mass index (BMI) 33.0-33.9, adult: Secondary | ICD-10-CM | POA: Diagnosis not present

## 2019-01-17 ENCOUNTER — Other Ambulatory Visit: Payer: Self-pay | Admitting: Internal Medicine

## 2019-01-17 DIAGNOSIS — R9389 Abnormal findings on diagnostic imaging of other specified body structures: Secondary | ICD-10-CM

## 2019-01-26 DIAGNOSIS — Z6833 Body mass index (BMI) 33.0-33.9, adult: Secondary | ICD-10-CM | POA: Diagnosis not present

## 2019-01-26 DIAGNOSIS — Z7901 Long term (current) use of anticoagulants: Secondary | ICD-10-CM | POA: Diagnosis not present

## 2019-01-26 DIAGNOSIS — I8291 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: Secondary | ICD-10-CM | POA: Diagnosis not present

## 2019-01-27 DIAGNOSIS — H04123 Dry eye syndrome of bilateral lacrimal glands: Secondary | ICD-10-CM | POA: Diagnosis not present

## 2019-01-27 DIAGNOSIS — H02831 Dermatochalasis of right upper eyelid: Secondary | ICD-10-CM | POA: Diagnosis not present

## 2019-01-27 DIAGNOSIS — H353132 Nonexudative age-related macular degeneration, bilateral, intermediate dry stage: Secondary | ICD-10-CM | POA: Diagnosis not present

## 2019-01-27 DIAGNOSIS — H02832 Dermatochalasis of right lower eyelid: Secondary | ICD-10-CM | POA: Diagnosis not present

## 2019-01-27 DIAGNOSIS — E119 Type 2 diabetes mellitus without complications: Secondary | ICD-10-CM | POA: Diagnosis not present

## 2019-01-27 DIAGNOSIS — H524 Presbyopia: Secondary | ICD-10-CM | POA: Diagnosis not present

## 2019-01-28 ENCOUNTER — Ambulatory Visit
Admission: RE | Admit: 2019-01-28 | Discharge: 2019-01-28 | Disposition: A | Discharge status: Home / Self Care | Payer: PPO | Source: Ambulatory Visit | Attending: Internal Medicine | Admitting: Internal Medicine

## 2019-01-28 DIAGNOSIS — J439 Emphysema, unspecified: Secondary | ICD-10-CM | POA: Diagnosis not present

## 2019-01-28 DIAGNOSIS — R9389 Abnormal findings on diagnostic imaging of other specified body structures: Secondary | ICD-10-CM

## 2019-02-04 ENCOUNTER — Other Ambulatory Visit (HOSPITAL_COMMUNITY): Payer: Self-pay | Admitting: Internal Medicine

## 2019-02-04 ENCOUNTER — Other Ambulatory Visit: Payer: Self-pay | Admitting: Internal Medicine

## 2019-02-04 DIAGNOSIS — R918 Other nonspecific abnormal finding of lung field: Secondary | ICD-10-CM

## 2019-02-14 ENCOUNTER — Ambulatory Visit (HOSPITAL_COMMUNITY)
Admission: RE | Admit: 2019-02-14 | Discharge: 2019-02-14 | Disposition: A | Discharge status: Home / Self Care | Payer: PPO | Source: Ambulatory Visit | Attending: Internal Medicine | Admitting: Internal Medicine

## 2019-02-14 ENCOUNTER — Other Ambulatory Visit: Payer: Self-pay

## 2019-02-14 DIAGNOSIS — R911 Solitary pulmonary nodule: Secondary | ICD-10-CM | POA: Diagnosis not present

## 2019-02-14 DIAGNOSIS — R918 Other nonspecific abnormal finding of lung field: Secondary | ICD-10-CM

## 2019-02-14 DIAGNOSIS — C3412 Malignant neoplasm of upper lobe, left bronchus or lung: Secondary | ICD-10-CM | POA: Insufficient documentation

## 2019-02-14 LAB — GLUCOSE, CAPILLARY: Glucose-Capillary: 157 mg/dL — ABNORMAL HIGH (ref 70–99)

## 2019-02-14 MED ORDER — FLUDEOXYGLUCOSE F - 18 (FDG) INJECTION
10.1000 | Freq: Once | INTRAVENOUS | Status: AC | PRN
  Administered 2019-02-14: 10.1 via INTRAVENOUS

## 2019-02-17 ENCOUNTER — Telehealth: Payer: Self-pay | Admitting: *Deleted

## 2019-02-17 NOTE — Telephone Encounter (Signed)
Oncology Nurse Navigator Documentation  Oncology Nurse Navigator Flowsheets 02/17/2019  Navigator Location [facility]  Navigator Encounter Type Telephone/I received referral on Robert Fisher.  I called and scheduled him to be seen at thoracic clinic on 03/03/2019.  I called referring office with an update on appt.   Telephone Outgoing Call  Barriers/Navigation Needs Education;Coordination of Care  Education Other  Interventions Coordination of Care;Education  Coordination of Care Appts  Education Method Verbal  Acuity Level 1  Time Spent with Patient 30

## 2019-03-03 ENCOUNTER — Telehealth: Payer: Self-pay | Admitting: Radiation Oncology

## 2019-03-03 ENCOUNTER — Ambulatory Visit
Admission: RE | Admit: 2019-03-03 | Discharge: 2019-03-03 | Disposition: A | Discharge status: Home / Self Care | Payer: PPO | Source: Ambulatory Visit | Attending: Radiation Oncology | Admitting: Radiation Oncology

## 2019-03-03 ENCOUNTER — Other Ambulatory Visit: Payer: Self-pay

## 2019-03-03 ENCOUNTER — Institutional Professional Consult (permissible substitution) (INDEPENDENT_AMBULATORY_CARE_PROVIDER_SITE_OTHER): Payer: PPO | Admitting: Thoracic Surgery (Cardiothoracic Vascular Surgery)

## 2019-03-03 ENCOUNTER — Encounter: Payer: PPO | Admitting: Cardiothoracic Surgery

## 2019-03-03 ENCOUNTER — Encounter: Payer: Self-pay | Admitting: Thoracic Surgery (Cardiothoracic Vascular Surgery)

## 2019-03-03 ENCOUNTER — Other Ambulatory Visit: Payer: Self-pay | Admitting: *Deleted

## 2019-03-03 VITALS — BP 157/65 | HR 73 | Temp 97.6°F | Resp 20 | Wt 201.1 lb

## 2019-03-03 DIAGNOSIS — Z1211 Encounter for screening for malignant neoplasm of colon: Secondary | ICD-10-CM | POA: Diagnosis not present

## 2019-03-03 DIAGNOSIS — Z87891 Personal history of nicotine dependence: Secondary | ICD-10-CM | POA: Diagnosis not present

## 2019-03-03 DIAGNOSIS — M542 Cervicalgia: Secondary | ICD-10-CM | POA: Diagnosis not present

## 2019-03-03 DIAGNOSIS — G8929 Other chronic pain: Secondary | ICD-10-CM | POA: Diagnosis not present

## 2019-03-03 DIAGNOSIS — R911 Solitary pulmonary nodule: Secondary | ICD-10-CM | POA: Diagnosis not present

## 2019-03-03 DIAGNOSIS — M545 Low back pain: Secondary | ICD-10-CM | POA: Diagnosis not present

## 2019-03-03 DIAGNOSIS — Z23 Encounter for immunization: Secondary | ICD-10-CM | POA: Diagnosis not present

## 2019-03-03 NOTE — H&P (View-Only) (Signed)
PCP is Shon Baton, MD Referring Provider is Shon Baton, MD  No chief complaint on file.    HPI: Mr. Moldovan is sent for consultation re: a left upper lobe lung nodule.  Gurshan Settlemire is an 85 yo man with a history of tobacco abuse, DVT, hypertension and schizophrenia. He was ill "about a month ago." c/o cough, shortness of breath and general malaise. CXR in January showed a left upper lobe opacity. Treated for possible pneumonia but follow up with CT in February showed a 1.5 x 1.8 x 2.2 cm spiculated left upper lobe nodule.  There was some borderline adenopathy. On PET nodule had an SUV of 12.6. No evidence of regional or distant spread.   Ambulates with walker. Has had several ED visits for falls. Has been on coumadin "over 20 years" for history of DVT. Smoked > 1 ppd for 50 years, quit 4 years ago. No chest pain, pressure or tightness.  Mrs Goar and their daughter participated in the visit by telephone Zubrod Score: At the time of surgery this patient's most appropriate activity status/level should be described as: []     0    Normal activity, no symptoms []     1    Restricted in physical strenuous activity but ambulatory, able to do out light work [x]     2    Ambulatory and capable of self care, unable to do work activities, up and about >50 % of waking hours                              []     3    Only limited self care, in bed greater than 50% of waking hours []     4    Completely disabled, no self care, confined to bed or chair []     5    Moribund  Past Medical History:  Diagnosis Date  . DVT (deep venous thrombosis) ([place])   . Hypertension   . Schizo affective schizophrenia ([place])     History reviewed. No pertinent surgical history.  History reviewed. No pertinent family history.  Social History Social History   Tobacco Use  . Smoking status: Former Research scientist (life sciences)  . Smokeless tobacco: Never Used  Substance Use Topics  . Alcohol use: No  . Drug use: No    Current  Outpatient Medications  Medication Sig Dispense Refill  . aspirin EC 81 MG tablet Take 81 mg by mouth daily.    Marland Kitchen atorvastatin (LIPITOR) 40 MG tablet Take 40 mg by mouth daily.   2  . benzonatate (TESSALON) 100 MG capsule Take 1 capsule (100 mg total) by mouth every 8 (eight) hours. 21 capsule 0  . buPROPion (WELLBUTRIN) 100 MG tablet Take 100 mg by mouth 2 (two) times daily.    . clonazePAM (KLONOPIN) 0.5 MG tablet Take 0.5 mg by mouth 2 (two) times daily as needed for anxiety.    . fenofibrate 160 MG tablet Take 160 mg by mouth daily.    . hydrochlorothiazide (HYDRODIURIL) 25 MG tablet Take 12.5 mg by mouth daily.    Marland Kitchen HYDROcodone-acetaminophen (NORCO/VICODIN) 5-325 MG per tablet Take 1 tablet by mouth every 6 (six) hours as needed for pain. (Patient not taking: Reported on 12/06/2018) 10 tablet 0  . metFORMIN (GLUCOPHAGE) 500 MG tablet Take 500 mg by mouth daily with breakfast.    . metoprolol (LOPRESSOR) 50 MG tablet Take 50 mg by mouth daily.    Marland Kitchen  OLANZapine (ZYPREXA) 10 MG tablet Take 10 mg by mouth at bedtime.    Marland Kitchen warfarin (COUMADIN) 5 MG tablet Take 2.5-5 mg by mouth daily. Take 1 tablet (5mg ) on Mondays, Wednesdays, Fridays, and take 1/2 tablet  (2.5mg ) on Tuesday, Thursday, Saturday and Sunday     No current facility-administered medications for this visit.     Allergies  Allergen Reactions  . Haloperidol Lactate Other (See Comments)    "made him climb the walls."  . Sildenafil Other (See Comments)    "pain in right thigh."    Review of Systems  Constitutional: Positive for activity change. Negative for fever and unexpected weight change.  HENT: Negative for trouble swallowing and voice change.   Respiratory: Positive for cough (about a month ago). Negative for shortness of breath.   Cardiovascular: Negative for chest pain.  Musculoskeletal: Positive for gait problem (multiple ED visits for falls).  Neurological: Positive for weakness (generalized- improved). Negative for  seizures and headaches.       Memory problems  Psychiatric/Behavioral:       Hx schizophrenia    BP (!) 157/65   Pulse 73   Temp 97.6 F (36.4 C)   Resp 20   Wt 201 lb 1.6 oz (91.2 kg)   SpO2 95%   BMI 28.05 kg/m  Physical Exam Vitals signs reviewed.  Constitutional:      General: He is not in acute distress.    Appearance: Normal appearance.  HENT:     Head: Normocephalic and atraumatic.     Mouth/Throat:     Pharynx: Oropharynx is clear.  Eyes:     General: No scleral icterus.    Extraocular Movements: Extraocular movements intact.     Conjunctiva/sclera: Conjunctivae normal.  Neck:     Musculoskeletal: Neck supple.  Cardiovascular:     Rate and Rhythm: Normal rate and regular rhythm.     Heart sounds: Normal heart sounds. No murmur. No friction rub. No gallop.   Pulmonary:     Effort: Pulmonary effort is normal. No respiratory distress.     Breath sounds: Normal breath sounds. No wheezing or rales.  Abdominal:     General: There is no distension.     Palpations: Abdomen is soft.     Tenderness: There is no abdominal tenderness.  Musculoskeletal:        General: No swelling.  Lymphadenopathy:     Cervical: No cervical adenopathy.  Skin:    General: Skin is warm and dry.  Neurological:     General: No focal deficit present.     Mental Status: He is alert.     Cranial Nerves: No cranial nerve deficit.     Gait: Gait abnormal.    Diagnostic Tests: CT CHEST WITHOUT CONTRAST  TECHNIQUE: Multidetector CT imaging of the chest was performed following the standard protocol without IV contrast.  COMPARISON:  Chest x-ray dated 12/06/2018  FINDINGS: Cardiovascular: Normal heart size. No pericardial fluid identified. Mild amount of calcified plaque in the distribution of the coronary arteries. Scattered plaque in the thoracic aorta without evidence of aneurysmal disease. Central pulmonary arteries are normal in caliber.  Mediastinum/Nodes: Scattered  mediastinal lymph nodes. The largest in the AP window measures approximately 11 mm in short axis. Left hilar lymph node measures approximately 9 mm in short axis.  Lungs/Pleura: Lobulated and spiculated solid mass in the left upper lobe measures approximately 1.5 x 1.8 x 2.2 cm. No other pulmonary nodules identified. No associated pleural fluid. Mild emphysema  present. No pulmonary edema or pneumothorax.  Upper Abdomen: No acute abnormality. No obvious masses in the upper abdomen.  Musculoskeletal: No bony lesions or fractures identified.  IMPRESSION: 1. Lobulated and spiculated solid mass of the left upper lobe measuring 1.5 x 1.8 x 2.2 cm. This most likely represents a primary lung carcinoma. 2. 11 mm AP window and 9 mm left hilar lymph nodes. 3. Mild pulmonary emphysema. 4. PET scan will be needed for full staging. Recommend referral to [facility] ([facility])for further evaluation.   Electronically Signed   By: Aletta Edouard M.D.   On: 01/28/2019 17:04 NUCLEAR MEDICINE PET SKULL BASE TO THIGH  TECHNIQUE: 10.1 mCi F-18 FDG was injected intravenously. Full-ring PET imaging was performed from the skull base to thigh after the radiotracer. CT data was obtained and used for attenuation correction and anatomic localization.  Fasting blood glucose: 157 mg/dl  COMPARISON:  Chest CT 01/28/2019  FINDINGS: Mediastinal blood pool activity: SUV max 3.3  NECK: No hypermetabolic lymph nodes in the neck.  Incidental CT findings: none  CHEST: The patient's known left upper lobe pulmonary nodule is markedly hypermetabolic with SUV max = 67.1. No evidence for hypermetabolic mediastinal or hilar lymphadenopathy. Unenlarged left axillary node shows low level uptake but this would be unusual presentation for metastatic disease likely reflects reactive etiology.  Incidental CT findings: Centrilobular emphysema noted. Atherosclerotic  calcification is noted in the wall of the thoracic aorta. Asymmetric increased number of unenlarged lymph nodes are seen in the left axilla in subpectoral region without marked hypermetabolism.  ABDOMEN/PELVIS: No abnormal hypermetabolic activity within the liver, pancreas, adrenal glands, or spleen. No hypermetabolic lymph nodes in the abdomen or pelvis.  Incidental CT findings: Calcified gallstones evident. No intrahepatic or extrahepatic biliary dilation. There is abdominal aortic atherosclerosis without aneurysm. Right groin hernia contains only fat.  SKELETON: No focal hypermetabolic activity to suggest skeletal metastasis.  Incidental CT findings: none  IMPRESSION: 1. Known left upper lobe pulmonary nodule is markedly hypermetabolic consistent with primary bronchogenic neoplasm. No evidence for hypermetabolic metastatic lymphadenopathy in the left hilum or mediastinum. 2. Small lymph nodes in the left subpectoral and axillary regions are asymmetrically increased in number compared to the right. Low level FDG uptake in these lymph nodes is evident. This would be atypical presentation for initial metastatic spread and features felt to be reactive in etiology. Attention on follow-up suggested. 3. No evidence for unexpected or suspicious hypermetabolic disease in the neck, abdomen, or pelvis. 4. Cholelithiasis. 5.  Aortic Atherosclerois (ICD10-170.0) 6.  Emphysema. ([id number])   Electronically Signed   By: Misty Stanley M.D.   On: 02/14/2019 09:06 I personally reviewed the CT and PET CT images and concur with above. Also films were reviewed in our [place] conference this morning  Impression: Mr Dauria is an 85 yo man with a history of tobacco abuse, hypertension, DVT, schizophrenia and early dementia. He has a 1.5 x 1/8 x 2.2 cm left upper lobe nodule that is markedly hypermetabolic on PET CT. Given his age, smoking history and appearance of the nodule this is  almost certainly a new primary bronchogenic carcinoma. Infectious and inflammatory nodules are also in the differential.   He is not a good operative candidate due to his limited functional status. He is probably candidate for SBRT and will meet with Rad Onc today.  I recommended we do an navigational bronchoscopy for diagnostic purposes. I described the general nature of the procedure and the need for general anesthesia  to Mr Blowe and his family. They understand it is diagnostic and not therapeutic. I informed them of the indications, risks, benefits and alternatives. They understand the risks include but are not limited to death, MI, DVT, PE, stroke, bleeding, pneumothorax, failure to make a diagnosis, as well as the possibility of other unforeseeable complications.  He is on coumadin for a remote DVT- coumadin will need to held prior to the biopsy.  Plan: ENB with fiducial placement  Will need to hold coumadin for 5 days prior to procedure  [name], MD [facility] [phone]

## 2019-03-03 NOTE — Progress Notes (Signed)
The proposed treatment discussed in cancer conference 03/03/2019 is for discussion purpose only and is not a binding recommendation.  The patient was not physically examined nor present for their treatment options.  Therefore, final treatment plans cannot be decided.

## 2019-03-03 NOTE — Progress Notes (Deleted)
Radiation Oncology         [phone] ________________________________  Name: Robert Fisher        MRN: [id number]  Date of Service: 03/03/2019 DOB: Aug 31, 1934  YQ:[id number], Robert Reichmann, MD  Robert Baton, MD     REFERRING PHYSICIAN: Shon Baton, MD   DIAGNOSIS: There were no encounter diagnoses.   HISTORY OF PRESENT ILLNESS: Robert Fisher is a 85 y.o. male seen at the request of Dr. Roxan Fisher for a newly noted lung nodule. [name] was noticing cough and weakness and on 12/06/2018 he underwent a CXR that showed mild patchy LUL opacity. He was treated with antibiotics and had a CT on 01/28/2019 that revealed a 15 x 18 x 22 mm solid spiculated mass in the LUL. He did have an 11 mm AP window node and a left hilar node measuring 9 mm. PET scan on 02/14/2019 revealed an SUV of 12.6 in the LUL nodule and no evidence of hypermetabolic mediastinal or hilar adenopathy. He has low level uptake in the left axilla but this was felt to be reactive. He's seen today after meeting with Dr. Roxan Fisher who will proceed with bronchoscopy for diagnosis, but he is not a surgical candidate for resection.     PREVIOUS RADIATION THERAPY: No   PAST MEDICAL HISTORY:  Past Medical History:  Diagnosis Date  . DVT (deep venous thrombosis) ([place])   . Hypertension   . Schizo affective schizophrenia ([place])        PAST SURGICAL HISTORY:No past surgical history on file.   FAMILY HISTORY: No family history on file.   SOCIAL HISTORY:  reports that he has quit smoking. He has never used smokeless tobacco. He reports that he does not drink alcohol or use drugs. The patient is married and lives in [place]. His wife and daughter accompany him by phone.    ALLERGIES: Haloperidol lactate and Sildenafil   MEDICATIONS:  Current Outpatient Medications  Medication Sig Dispense Refill  . aspirin EC 81 MG tablet Take 81 mg by mouth daily.    Marland Kitchen atorvastatin (LIPITOR) 40 MG tablet Take 40 mg by mouth daily.   2  .  benzonatate (TESSALON) 100 MG capsule Take 1 capsule (100 mg total) by mouth every 8 (eight) hours. 21 capsule 0  . buPROPion (WELLBUTRIN) 100 MG tablet Take 100 mg by mouth 2 (two) times daily.    . clonazePAM (KLONOPIN) 0.5 MG tablet Take 0.5 mg by mouth 2 (two) times daily as needed for anxiety.    . fenofibrate 160 MG tablet Take 160 mg by mouth daily.    . hydrochlorothiazide (HYDRODIURIL) 25 MG tablet Take 12.5 mg by mouth daily.    Marland Kitchen HYDROcodone-acetaminophen (NORCO/VICODIN) 5-325 MG per tablet Take 1 tablet by mouth every 6 (six) hours as needed for pain. (Patient not taking: Reported on 12/06/2018) 10 tablet 0  . metFORMIN (GLUCOPHAGE) 500 MG tablet Take 500 mg by mouth daily with breakfast.    . metoprolol (LOPRESSOR) 50 MG tablet Take 50 mg by mouth daily.    Marland Kitchen OLANZapine (ZYPREXA) 10 MG tablet Take 10 mg by mouth at bedtime.    Marland Kitchen warfarin (COUMADIN) 5 MG tablet Take 2.5-5 mg by mouth daily. Take 1 tablet (5mg ) on Mondays, Wednesdays, Fridays, and take 1/2 tablet  (2.5mg ) on Tuesday, Thursday, Saturday and Sunday     No current facility-administered medications for this encounter.      REVIEW OF SYSTEMS: On review of systems, the patient reports that he is  doing well overall. He has had some redness in his eyes his family reports. He denies any chest pain, shortness of breath, cough, fevers, chills, night sweats. He has had a few pounds of weight loss. No other complaints are noted.    PHYSICAL EXAM:  Wt Readings from Last 3 Encounters:  01/25/13 205 lb (93 kg)   Temp Readings from Last 3 Encounters:  12/06/18 97.8 F (36.6 C) (Oral)  11/15/15 97.3 F (36.3 C) (Oral)  01/25/13 97.7 F (36.5 C)   BP Readings from Last 3 Encounters:  12/06/18 134/67  11/15/15 165/70  01/25/13 149/69   Pulse Readings from Last 3 Encounters:  12/06/18 70  11/15/15 65    In general this is a well appearing caucasian male in no acute distress. He is alert and oriented x4 and appropriate  throughout the examination. Cardiopulmonary assessment is negative for acute distress and he exhibits normal effort.     ECOG = 1  0 - Asymptomatic (Fully active, able to carry on all predisease activities without restriction)  1 - Symptomatic but completely ambulatory (Restricted in physically strenuous activity but ambulatory and able to carry out work of a light or sedentary nature. For example, light housework, office work)  2 - Symptomatic, <50% in bed during the day (Ambulatory and capable of all self care but unable to carry out any work activities. Up and about more than 50% of waking hours)  3 - Symptomatic, >50% in bed, but not bedbound (Capable of only limited self-care, confined to bed or chair 50% or more of waking hours)  4 - Bedbound (Completely disabled. Cannot carry on any self-care. Totally confined to bed or chair)  5 - Death   Robert Fisher MM, Creech RH, Tormey DC, et al. [phone]). "Toxicity and response criteria of the [facility]". [place] Oncol. 5 (6): 649-55    LABORATORY DATA:  Lab Results  Component Value Date   WBC 13.4 (H) 12/06/2018   HGB 16.5 12/06/2018   HCT 52.4 (H) 12/06/2018   MCV 93.9 12/06/2018   PLT 283 12/06/2018   Lab Results  Component Value Date   NA 137 12/06/2018   K 3.8 12/06/2018   CL 103 12/06/2018   CO2 25 12/06/2018   Lab Results  Component Value Date   ALT 26 12/26/2009   AST 21 12/26/2009   ALKPHOS 39 12/26/2009   BILITOT 0.5 12/26/2009      RADIOGRAPHY: Nm Pet Image Initial (pi) Skull Base To Thigh  Result Date: 02/14/2019 CLINICAL DATA:  Initial treatment strategy for pulmonary nodule. EXAM: NUCLEAR MEDICINE PET SKULL BASE TO THIGH TECHNIQUE: 10.1 mCi F-18 FDG was injected intravenously. Full-ring PET imaging was performed from the skull base to thigh after the radiotracer. CT data was obtained and used for attenuation correction and anatomic localization. Fasting blood glucose: 157 mg/dl  COMPARISON:  Chest CT 01/28/2019 FINDINGS: Mediastinal blood pool activity: SUV max 3.3 NECK: No hypermetabolic lymph nodes in the neck. Incidental CT findings: none CHEST: The patient's known left upper lobe pulmonary nodule is markedly hypermetabolic with SUV max = 29.5. No evidence for hypermetabolic mediastinal or hilar lymphadenopathy. Unenlarged left axillary node shows low level uptake but this would be unusual presentation for metastatic disease likely reflects reactive etiology. Incidental CT findings: Centrilobular emphysema noted. Atherosclerotic calcification is noted in the wall of the thoracic aorta. Asymmetric increased number of unenlarged lymph nodes are seen in the left axilla in subpectoral region without marked hypermetabolism. ABDOMEN/PELVIS:  No abnormal hypermetabolic activity within the liver, pancreas, adrenal glands, or spleen. No hypermetabolic lymph nodes in the abdomen or pelvis. Incidental CT findings: Calcified gallstones evident. No intrahepatic or extrahepatic biliary dilation. There is abdominal aortic atherosclerosis without aneurysm. Right groin hernia contains only fat. SKELETON: No focal hypermetabolic activity to suggest skeletal metastasis. Incidental CT findings: none IMPRESSION: 1. Known left upper lobe pulmonary nodule is markedly hypermetabolic consistent with primary bronchogenic neoplasm. No evidence for hypermetabolic metastatic lymphadenopathy in the left hilum or mediastinum. 2. Small lymph nodes in the left subpectoral and axillary regions are asymmetrically increased in number compared to the right. Low level FDG uptake in these lymph nodes is evident. This would be atypical presentation for initial metastatic spread and features felt to be reactive in etiology. Attention on follow-up suggested. 3. No evidence for unexpected or suspicious hypermetabolic disease in the neck, abdomen, or pelvis. 4. Cholelithiasis. 5.  Aortic Atherosclerois (ICD10-170.0) 6.  Emphysema.  ([id number]) Electronically Signed   By: Misty Stanley M.D.   On: 02/14/2019 09:06       IMPRESSION/PLAN: 1. Putative Stage IA, NSCLC of the left upper lobe. Dr. Lisbeth Renshaw discusses the pathology findings and reviews the nature of early stage lung cancer. He agrees with Dr. Leonarda Salon plans to proceed with navigational biopsy and then to proceed with stereotactic body radiotherapy (SBRT) to the left upper lobe target. Dr. Lisbeth Renshaw discusses that while surgery is the gold standard of treatment, radiation is an acceptable option for those who are not surgical candidates as Mr. Bienvenue is not a candidate. We discussed the risks, benefits, short, and long term effects of radiotherapy, and the patient is interested in proceeding. Dr. Lisbeth Renshaw discusses the delivery and logistics of radiotherapy and anticipates a course of 3-5 fractions of radiotherapy. We will see her back about 2 weeks after surgery to discuss the simulation process and anticipate we starting radiotherapy about 4-6 weeks after surgery.  2. Red Eyes. I couldn't examine him well but we discussed contacting his PCP for further evaluation.   In a visit lasting 25 minutes, greater than 50% of the time was spent face to face discussing his case, and coordinating the patient's care.  This encounter was provided by telemedicine platform Webex.  The patient has given verbal consent for this type of encounter and has been advised to only accept a meeting of this type in a secure network environment. The time spent during this encounter was 25 minutes. The attendants for this meeting include Dr. Lisbeth Renshaw, Norton Blizzard, RN,  Hayden Pedro  and Jodene Nam. His wife and daughter were on the phone on speaker. During the encounter,Dr. Yvette Rack, RN, and  Hayden Pedro were located at [facility] Radiation Oncology Department.  KROSS SWALLOWS was located at [facility] in Medical Oncology.  The  above documentation reflects my direct findings during this shared patient visit. Please see the separate note by Dr. Lisbeth Renshaw on this date for the remainder of the patient's plan of care.    Carola Rhine, PAC

## 2019-03-03 NOTE — Telephone Encounter (Signed)
error 

## 2019-03-03 NOTE — Progress Notes (Signed)
PCP is Shon Baton, MD Referring Provider is Shon Baton, MD  No chief complaint on file.    HPI: [name] is sent for consultation re: a left upper lobe lung nodule.  Robert Fisher is an 85 yo man with a history of tobacco abuse, DVT, hypertension and schizophrenia. He was ill "about a month ago." c/o cough, shortness of breath and general malaise. CXR in January showed a left upper lobe opacity. Treated for possible pneumonia but follow up with CT in February showed a 1.5 x 1.8 x 2.2 cm spiculated left upper lobe nodule.  There was some borderline adenopathy. On PET nodule had an SUV of 12.6. No evidence of regional or distant spread.   Ambulates with walker. Has had several ED visits for falls. Has been on coumadin "over 20 years" for history of DVT. Smoked > 1 ppd for 50 years, quit 4 years ago. No chest pain, pressure or tightness.  Mrs Lolli and their daughter participated in the visit by telephone Zubrod Score: At the time of surgery this patient's most appropriate activity status/level should be described as: []     0    Normal activity, no symptoms []     1    Restricted in physical strenuous activity but ambulatory, able to do out light work [x]     2    Ambulatory and capable of self care, unable to do work activities, up and about >50 % of waking hours                              []     3    Only limited self care, in bed greater than 50% of waking hours []     4    Completely disabled, no self care, confined to bed or chair []     5    Moribund  Past Medical History:  Diagnosis Date  . DVT (deep venous thrombosis) ([place])   . Hypertension   . Schizo affective schizophrenia ([place])     History reviewed. No pertinent surgical history.  History reviewed. No pertinent family history.  Social History Social History   Tobacco Use  . Smoking status: Former Research scientist (life sciences)  . Smokeless tobacco: Never Used  Substance Use Topics  . Alcohol use: No  . Drug use: No    Current  Outpatient Medications  Medication Sig Dispense Refill  . aspirin EC 81 MG tablet Take 81 mg by mouth daily.    Marland Kitchen atorvastatin (LIPITOR) 40 MG tablet Take 40 mg by mouth daily.   2  . benzonatate (TESSALON) 100 MG capsule Take 1 capsule (100 mg total) by mouth every 8 (eight) hours. 21 capsule 0  . buPROPion (WELLBUTRIN) 100 MG tablet Take 100 mg by mouth 2 (two) times daily.    . clonazePAM (KLONOPIN) 0.5 MG tablet Take 0.5 mg by mouth 2 (two) times daily as needed for anxiety.    . fenofibrate 160 MG tablet Take 160 mg by mouth daily.    . hydrochlorothiazide (HYDRODIURIL) 25 MG tablet Take 12.5 mg by mouth daily.    Marland Kitchen HYDROcodone-acetaminophen (NORCO/VICODIN) 5-325 MG per tablet Take 1 tablet by mouth every 6 (six) hours as needed for pain. (Patient not taking: Reported on 12/06/2018) 10 tablet 0  . metFORMIN (GLUCOPHAGE) 500 MG tablet Take 500 mg by mouth daily with breakfast.    . metoprolol (LOPRESSOR) 50 MG tablet Take 50 mg by mouth daily.    Marland Kitchen  OLANZapine (ZYPREXA) 10 MG tablet Take 10 mg by mouth at bedtime.    Marland Kitchen warfarin (COUMADIN) 5 MG tablet Take 2.5-5 mg by mouth daily. Take 1 tablet (5mg ) on Mondays, Wednesdays, Fridays, and take 1/2 tablet  (2.5mg ) on Tuesday, Thursday, Saturday and Sunday     No current facility-administered medications for this visit.     Allergies  Allergen Reactions  . Haloperidol Lactate Other (See Comments)    "made him climb the walls."  . Sildenafil Other (See Comments)    "pain in right thigh."    Review of Systems  Constitutional: Positive for activity change. Negative for fever and unexpected weight change.  HENT: Negative for trouble swallowing and voice change.   Respiratory: Positive for cough (about a month ago). Negative for shortness of breath.   Cardiovascular: Negative for chest pain.  Musculoskeletal: Positive for gait problem (multiple ED visits for falls).  Neurological: Positive for weakness (generalized- improved). Negative for  seizures and headaches.       Memory problems  Psychiatric/Behavioral:       Hx schizophrenia    BP (!) 157/65   Pulse 73   Temp 97.6 F (36.4 C)   Resp 20   Wt 201 lb 1.6 oz (91.2 kg)   SpO2 95%   BMI 28.05 kg/m  Physical Exam Vitals signs reviewed.  Constitutional:      General: He is not in acute distress.    Appearance: Normal appearance.  HENT:     Head: Normocephalic and atraumatic.     Mouth/Throat:     Pharynx: Oropharynx is clear.  Eyes:     General: No scleral icterus.    Extraocular Movements: Extraocular movements intact.     Conjunctiva/sclera: Conjunctivae normal.  Neck:     Musculoskeletal: Neck supple.  Cardiovascular:     Rate and Rhythm: Normal rate and regular rhythm.     Heart sounds: Normal heart sounds. No murmur. No friction rub. No gallop.   Pulmonary:     Effort: Pulmonary effort is normal. No respiratory distress.     Breath sounds: Normal breath sounds. No wheezing or rales.  Abdominal:     General: There is no distension.     Palpations: Abdomen is soft.     Tenderness: There is no abdominal tenderness.  Musculoskeletal:        General: No swelling.  Lymphadenopathy:     Cervical: No cervical adenopathy.  Skin:    General: Skin is warm and dry.  Neurological:     General: No focal deficit present.     Mental Status: He is alert.     Cranial Nerves: No cranial nerve deficit.     Gait: Gait abnormal.    Diagnostic Tests: CT CHEST WITHOUT CONTRAST  TECHNIQUE: Multidetector CT imaging of the chest was performed following the standard protocol without IV contrast.  COMPARISON:  Chest x-ray dated 12/06/2018  FINDINGS: Cardiovascular: Normal heart size. No pericardial fluid identified. Mild amount of calcified plaque in the distribution of the coronary arteries. Scattered plaque in the thoracic aorta without evidence of aneurysmal disease. Central pulmonary arteries are normal in caliber.  Mediastinum/Nodes: Scattered  mediastinal lymph nodes. The largest in the AP window measures approximately 11 mm in short axis. Left hilar lymph node measures approximately 9 mm in short axis.  Lungs/Pleura: Lobulated and spiculated solid mass in the left upper lobe measures approximately 1.5 x 1.8 x 2.2 cm. No other pulmonary nodules identified. No associated pleural fluid. Mild emphysema  present. No pulmonary edema or pneumothorax.  Upper Abdomen: No acute abnormality. No obvious masses in the upper abdomen.  Musculoskeletal: No bony lesions or fractures identified.  IMPRESSION: 1. Lobulated and spiculated solid mass of the left upper lobe measuring 1.5 x 1.8 x 2.2 cm. This most likely represents a primary lung carcinoma. 2. 11 mm AP window and 9 mm left hilar lymph nodes. 3. Mild pulmonary emphysema. 4. PET scan will be needed for full staging. Recommend referral to [facility] ([facility])for further evaluation.   Electronically Signed   By: Aletta Edouard M.D.   On: 01/28/2019 17:04 NUCLEAR MEDICINE PET SKULL BASE TO THIGH  TECHNIQUE: 10.1 mCi F-18 FDG was injected intravenously. Full-ring PET imaging was performed from the skull base to thigh after the radiotracer. CT data was obtained and used for attenuation correction and anatomic localization.  Fasting blood glucose: 157 mg/dl  COMPARISON:  Chest CT 01/28/2019  FINDINGS: Mediastinal blood pool activity: SUV max 3.3  NECK: No hypermetabolic lymph nodes in the neck.  Incidental CT findings: none  CHEST: The patient's known left upper lobe pulmonary nodule is markedly hypermetabolic with SUV max = 40.0. No evidence for hypermetabolic mediastinal or hilar lymphadenopathy. Unenlarged left axillary node shows low level uptake but this would be unusual presentation for metastatic disease likely reflects reactive etiology.  Incidental CT findings: Centrilobular emphysema noted. Atherosclerotic  calcification is noted in the wall of the thoracic aorta. Asymmetric increased number of unenlarged lymph nodes are seen in the left axilla in subpectoral region without marked hypermetabolism.  ABDOMEN/PELVIS: No abnormal hypermetabolic activity within the liver, pancreas, adrenal glands, or spleen. No hypermetabolic lymph nodes in the abdomen or pelvis.  Incidental CT findings: Calcified gallstones evident. No intrahepatic or extrahepatic biliary dilation. There is abdominal aortic atherosclerosis without aneurysm. Right groin hernia contains only fat.  SKELETON: No focal hypermetabolic activity to suggest skeletal metastasis.  Incidental CT findings: none  IMPRESSION: 1. Known left upper lobe pulmonary nodule is markedly hypermetabolic consistent with primary bronchogenic neoplasm. No evidence for hypermetabolic metastatic lymphadenopathy in the left hilum or mediastinum. 2. Small lymph nodes in the left subpectoral and axillary regions are asymmetrically increased in number compared to the right. Low level FDG uptake in these lymph nodes is evident. This would be atypical presentation for initial metastatic spread and features felt to be reactive in etiology. Attention on follow-up suggested. 3. No evidence for unexpected or suspicious hypermetabolic disease in the neck, abdomen, or pelvis. 4. Cholelithiasis. 5.  Aortic Atherosclerois (ICD10-170.0) 6.  Emphysema. ([id number])   Electronically Signed   By: Misty Stanley M.D.   On: 02/14/2019 09:06 I personally reviewed the CT and PET CT images and concur with above. Also films were reviewed in our [place] conference this morning  Impression: Mr Carmer is an 85 yo man with a history of tobacco abuse, hypertension, DVT, schizophrenia and early dementia. He has a 1.5 x 1/8 x 2.2 cm left upper lobe nodule that is markedly hypermetabolic on PET CT. Given his age, smoking history and appearance of the nodule this is  almost certainly a new primary bronchogenic carcinoma. Infectious and inflammatory nodules are also in the differential.   He is not a good operative candidate due to his limited functional status. He is probably candidate for SBRT and will meet with Rad Onc today.  I recommended we do an navigational bronchoscopy for diagnostic purposes. I described the general nature of the procedure and the need for general anesthesia  to Mr Couts and his family. They understand it is diagnostic and not therapeutic. I informed them of the indications, risks, benefits and alternatives. They understand the risks include but are not limited to death, MI, DVT, PE, stroke, bleeding, pneumothorax, failure to make a diagnosis, as well as the possibility of other unforeseeable complications.  He is on coumadin for a remote DVT- coumadin will need to held prior to the biopsy.  Plan: ENB with fiducial placement  Will need to hold coumadin for 5 days prior to procedure  [name], MD [facility] [phone]

## 2019-03-04 ENCOUNTER — Other Ambulatory Visit: Payer: Self-pay | Admitting: *Deleted

## 2019-03-04 DIAGNOSIS — R911 Solitary pulmonary nodule: Secondary | ICD-10-CM

## 2019-03-07 ENCOUNTER — Encounter: Payer: Self-pay | Admitting: *Deleted

## 2019-03-07 ENCOUNTER — Encounter (HOSPITAL_COMMUNITY): Payer: Self-pay | Admitting: *Deleted

## 2019-03-07 ENCOUNTER — Encounter: Payer: PPO | Admitting: Thoracic Surgery (Cardiothoracic Vascular Surgery)

## 2019-03-07 ENCOUNTER — Other Ambulatory Visit: Payer: Self-pay

## 2019-03-07 DIAGNOSIS — R911 Solitary pulmonary nodule: Secondary | ICD-10-CM

## 2019-03-07 NOTE — Progress Notes (Signed)
SDW-pre-op call completed by pt spouse, Joy with verbal consent from pt. Spouse denies that pt C/O SOB and chest pain. Spouse denies that pt is under the care of a cardiologist. Spouse stated that pt PCP is Dr. Virgina Jock at [facility] stated that pt had a stress test > 15 years ago but denies that pt had an echo and cardiac cath. Spouse denies that pt had an EKG within the last year. Spouse made aware to have pt stop taking vitamins, fish oil and herbal medications. Do not take any NSAIDs ie: Ibuprofen, Advil, Naproxen (Aleve), Motrin, BC and Goody Powder. Spouse stated that pt last dose of Coumadin was " Thursday " as instructed. Spouse stated that pt is " borderline diabetic" and pt does not check his blood glucose.   Coronavirus Screening  Pt spouse denies that all family members experienced the following symptoms:  Cough yes/no: No Fever (>100.53F)  yes/no: No Runny nose yes/no: No Sore throat yes/no: No Difficulty breathing/shortness of breath  yes/no: No  Have you or a family member traveled in the last 14 days and where? yes/no: No Spouse reminded that hospital visitation restrictions are in effect and the importance of the restrictions.  Spouse verbalized understanding of all pre-op instructions.

## 2019-03-07 NOTE — Progress Notes (Signed)
Oncology Nurse Navigator Documentation  Oncology Nurse Navigator Flowsheets 03/07/2019  Navigator Location [facility]  Navigator Encounter Type Clinic/MDC;Other/I spoke with patient at clinic on 03/03/2019.  His family was on the phone due to the new policy of no visitors with patients.  I helped facilitate webex meeting and the phone call with the family due to Mr. Gertner's memory issues.  I explained next steps of biopsy to him and his family.   Today, I updated Dr. Lisbeth Renshaw patient's biopsy is scheduled for 03/09/2019.   Telephone -  Abnormal Finding Date 12/16/2018  Multidisiplinary Clinic Date 03/03/2019  Patient Visit Type MedOnc  Treatment Phase Abnormal Scans  Barriers/Navigation Needs Education;Coordination of Care  Education Other  Interventions Coordination of Care;Education  Coordination of Care Other  Education Method Verbal  Acuity Level 3  Acuity Level 3 Coordination of multimodality treatment;Other  Time Spent with Patient 60

## 2019-03-08 ENCOUNTER — Telehealth: Payer: Self-pay | Admitting: *Deleted

## 2019-03-08 ENCOUNTER — Encounter (HOSPITAL_COMMUNITY): Payer: Self-pay | Admitting: Certified Registered Nurse Anesthetist

## 2019-03-08 NOTE — Telephone Encounter (Signed)
[name]  Clinical Social Work was referred by Norton Blizzard, navigator, for assessment of psychosocial needs.  Clinical Social Worker contacted patient by phone  to offer support and assess for needs.  Robert Fisher lives with his spouse and has three adult children.  He reported one daughter "lives nearby" and all children are very supportive.  He reported no anxiety, currently takes anti-anxiety medication that he feels manages anxiety well.  CSW briefly reviewed role and encouraged patient/spouse to contact CSW as needed.  Daughter lives near by , other daughter Robert Fisher, Robert Fisher

## 2019-03-09 ENCOUNTER — Telehealth: Payer: Self-pay | Admitting: Radiation Oncology

## 2019-03-09 ENCOUNTER — Encounter (HOSPITAL_COMMUNITY)
Admission: RE | Disposition: A | Discharge status: Home / Self Care | Payer: Self-pay | Source: Home / Self Care | Attending: Thoracic Surgery (Cardiothoracic Vascular Surgery)

## 2019-03-09 ENCOUNTER — Other Ambulatory Visit: Payer: Self-pay

## 2019-03-09 ENCOUNTER — Ambulatory Visit (HOSPITAL_COMMUNITY): Payer: PPO

## 2019-03-09 ENCOUNTER — Ambulatory Visit (HOSPITAL_COMMUNITY)
Admission: RE | Admit: 2019-03-09 | Discharge: 2019-03-09 | Disposition: A | Discharge status: Home / Self Care | Payer: PPO | Attending: Thoracic Surgery (Cardiothoracic Vascular Surgery) | Admitting: Thoracic Surgery (Cardiothoracic Vascular Surgery)

## 2019-03-09 ENCOUNTER — Ambulatory Visit (HOSPITAL_COMMUNITY): Payer: PPO | Admitting: Certified Registered Nurse Anesthetist

## 2019-03-09 DIAGNOSIS — Z79899 Other long term (current) drug therapy: Secondary | ICD-10-CM | POA: Diagnosis not present

## 2019-03-09 DIAGNOSIS — R195 Other fecal abnormalities: Secondary | ICD-10-CM | POA: Diagnosis not present

## 2019-03-09 DIAGNOSIS — M5416 Radiculopathy, lumbar region: Secondary | ICD-10-CM | POA: Diagnosis not present

## 2019-03-09 DIAGNOSIS — R948 Abnormal results of function studies of other organs and systems: Secondary | ICD-10-CM | POA: Diagnosis not present

## 2019-03-09 DIAGNOSIS — Z419 Encounter for procedure for purposes other than remedying health state, unspecified: Secondary | ICD-10-CM

## 2019-03-09 DIAGNOSIS — Z7982 Long term (current) use of aspirin: Secondary | ICD-10-CM | POA: Insufficient documentation

## 2019-03-09 DIAGNOSIS — J439 Emphysema, unspecified: Secondary | ICD-10-CM | POA: Diagnosis not present

## 2019-03-09 DIAGNOSIS — F209 Schizophrenia, unspecified: Secondary | ICD-10-CM | POA: Diagnosis not present

## 2019-03-09 DIAGNOSIS — I4819 Other persistent atrial fibrillation: Secondary | ICD-10-CM | POA: Diagnosis not present

## 2019-03-09 DIAGNOSIS — C3412 Malignant neoplasm of upper lobe, left bronchus or lung: Secondary | ICD-10-CM | POA: Insufficient documentation

## 2019-03-09 DIAGNOSIS — Z23 Encounter for immunization: Secondary | ICD-10-CM | POA: Diagnosis not present

## 2019-03-09 DIAGNOSIS — I447 Left bundle-branch block, unspecified: Secondary | ICD-10-CM | POA: Diagnosis not present

## 2019-03-09 DIAGNOSIS — K635 Polyp of colon: Secondary | ICD-10-CM | POA: Diagnosis not present

## 2019-03-09 DIAGNOSIS — I712 Thoracic aortic aneurysm, without rupture: Secondary | ICD-10-CM | POA: Diagnosis not present

## 2019-03-09 DIAGNOSIS — I10 Essential (primary) hypertension: Secondary | ICD-10-CM | POA: Insufficient documentation

## 2019-03-09 DIAGNOSIS — R918 Other nonspecific abnormal finding of lung field: Secondary | ICD-10-CM | POA: Diagnosis not present

## 2019-03-09 DIAGNOSIS — D125 Benign neoplasm of sigmoid colon: Secondary | ICD-10-CM | POA: Diagnosis not present

## 2019-03-09 DIAGNOSIS — Z888 Allergy status to other drugs, medicaments and biological substances status: Secondary | ICD-10-CM | POA: Diagnosis not present

## 2019-03-09 DIAGNOSIS — M545 Low back pain: Secondary | ICD-10-CM | POA: Diagnosis not present

## 2019-03-09 DIAGNOSIS — Z87891 Personal history of nicotine dependence: Secondary | ICD-10-CM | POA: Insufficient documentation

## 2019-03-09 DIAGNOSIS — K5730 Diverticulosis of large intestine without perforation or abscess without bleeding: Secondary | ICD-10-CM | POA: Diagnosis not present

## 2019-03-09 DIAGNOSIS — I739 Peripheral vascular disease, unspecified: Secondary | ICD-10-CM | POA: Diagnosis not present

## 2019-03-09 DIAGNOSIS — Z01818 Encounter for other preprocedural examination: Secondary | ICD-10-CM | POA: Diagnosis not present

## 2019-03-09 DIAGNOSIS — R911 Solitary pulmonary nodule: Secondary | ICD-10-CM

## 2019-03-09 DIAGNOSIS — N182 Chronic kidney disease, stage 2 (mild): Secondary | ICD-10-CM | POA: Diagnosis not present

## 2019-03-09 DIAGNOSIS — Z1211 Encounter for screening for malignant neoplasm of colon: Secondary | ICD-10-CM | POA: Diagnosis not present

## 2019-03-09 DIAGNOSIS — Z86718 Personal history of other venous thrombosis and embolism: Secondary | ICD-10-CM | POA: Insufficient documentation

## 2019-03-09 DIAGNOSIS — R739 Hyperglycemia, unspecified: Secondary | ICD-10-CM | POA: Diagnosis not present

## 2019-03-09 DIAGNOSIS — Z7901 Long term (current) use of anticoagulants: Secondary | ICD-10-CM | POA: Insufficient documentation

## 2019-03-09 DIAGNOSIS — Z8371 Family history of colonic polyps: Secondary | ICD-10-CM | POA: Diagnosis not present

## 2019-03-09 DIAGNOSIS — I129 Hypertensive chronic kidney disease with stage 1 through stage 4 chronic kidney disease, or unspecified chronic kidney disease: Secondary | ICD-10-CM | POA: Diagnosis not present

## 2019-03-09 DIAGNOSIS — D509 Iron deficiency anemia, unspecified: Secondary | ICD-10-CM | POA: Diagnosis not present

## 2019-03-09 DIAGNOSIS — E119 Type 2 diabetes mellitus without complications: Secondary | ICD-10-CM | POA: Diagnosis not present

## 2019-03-09 DIAGNOSIS — Z86010 Personal history of colonic polyps: Secondary | ICD-10-CM | POA: Diagnosis not present

## 2019-03-09 DIAGNOSIS — J449 Chronic obstructive pulmonary disease, unspecified: Secondary | ICD-10-CM | POA: Diagnosis not present

## 2019-03-09 DIAGNOSIS — Z7984 Long term (current) use of oral hypoglycemic drugs: Secondary | ICD-10-CM | POA: Diagnosis not present

## 2019-03-09 DIAGNOSIS — E782 Mixed hyperlipidemia: Secondary | ICD-10-CM | POA: Diagnosis not present

## 2019-03-09 DIAGNOSIS — Z6831 Body mass index (BMI) 31.0-31.9, adult: Secondary | ICD-10-CM | POA: Diagnosis not present

## 2019-03-09 DIAGNOSIS — J329 Chronic sinusitis, unspecified: Secondary | ICD-10-CM | POA: Diagnosis not present

## 2019-03-09 DIAGNOSIS — N1832 Chronic kidney disease, stage 3b: Secondary | ICD-10-CM | POA: Diagnosis not present

## 2019-03-09 HISTORY — DX: Pneumonia, unspecified organism: J18.9

## 2019-03-09 HISTORY — DX: Other amnesia: R41.3

## 2019-03-09 HISTORY — DX: Chronic obstructive pulmonary disease, unspecified: J44.9

## 2019-03-09 HISTORY — DX: Malignant (primary) neoplasm, unspecified: C80.1

## 2019-03-09 HISTORY — DX: Complete loss of teeth, unspecified cause, unspecified class: K08.109

## 2019-03-09 HISTORY — DX: Presence of external hearing-aid: Z97.4

## 2019-03-09 HISTORY — DX: Unspecified hearing loss, unspecified ear: H91.90

## 2019-03-09 HISTORY — DX: Solitary pulmonary nodule: R91.1

## 2019-03-09 HISTORY — PX: FUDUCIAL PLACEMENT: SHX5083

## 2019-03-09 HISTORY — DX: Prediabetes: R73.03

## 2019-03-09 HISTORY — DX: Complete loss of teeth, unspecified cause, unspecified class: Z97.2

## 2019-03-09 HISTORY — PX: VIDEO BRONCHOSCOPY WITH ENDOBRONCHIAL NAVIGATION: SHX6175

## 2019-03-09 LAB — CBC
HCT: 52 % (ref 39.0–52.0)
Hemoglobin: 16.4 g/dL (ref 13.0–17.0)
MCH: 29.7 pg (ref 26.0–34.0)
MCHC: 31.5 g/dL (ref 30.0–36.0)
MCV: 94 fL (ref 80.0–100.0)
Platelets: 248 10*3/uL (ref 150–400)
RBC: 5.53 MIL/uL (ref 4.22–5.81)
RDW: 14.2 % (ref 11.5–15.5)
WBC: 10.5 10*3/uL (ref 4.0–10.5)
nRBC: 0 % (ref 0.0–0.2)

## 2019-03-09 LAB — COMPREHENSIVE METABOLIC PANEL
ALT: 31 U/L (ref 0–44)
AST: 26 U/L (ref 15–41)
Albumin: 3.9 g/dL (ref 3.5–5.0)
Alkaline Phosphatase: 40 U/L (ref 38–126)
Anion gap: 11 (ref 5–15)
BUN: 20 mg/dL (ref 8–23)
CO2: 23 mmol/L (ref 22–32)
Calcium: 9.9 mg/dL (ref 8.9–10.3)
Chloride: 105 mmol/L (ref 98–111)
Creatinine, Ser: 1.34 mg/dL — ABNORMAL HIGH (ref 0.61–1.24)
GFR calc Af Amer: 57 mL/min — ABNORMAL LOW (ref >60)
GFR calc non Af Amer: 49 mL/min — ABNORMAL LOW (ref >60)
Glucose, Bld: 156 mg/dL — ABNORMAL HIGH (ref 70–99)
Potassium: 3.7 mmol/L (ref 3.5–5.1)
Sodium: 139 mmol/L (ref 135–145)
Total Bilirubin: 0.7 mg/dL (ref 0.3–1.2)
Total Protein: 6.5 g/dL (ref 6.5–8.1)

## 2019-03-09 LAB — PROTIME-INR
INR: 1.2 (ref 0.8–1.2)
Prothrombin Time: 15 seconds (ref 11.4–15.2)

## 2019-03-09 LAB — APTT: aPTT: 32 seconds (ref 24–36)

## 2019-03-09 LAB — GLUCOSE, CAPILLARY: Glucose-Capillary: 142 mg/dL — ABNORMAL HIGH (ref 70–99)

## 2019-03-09 SURGERY — VIDEO BRONCHOSCOPY WITH ENDOBRONCHIAL NAVIGATION
Anesthesia: General | Site: Chest

## 2019-03-09 MED ORDER — OXYCODONE HCL 5 MG/5ML PO SOLN: 5.0000 mg | Freq: Once | ORAL | Status: DC | PRN

## 2019-03-09 MED ORDER — FENTANYL CITRATE (PF) 100 MCG/2ML IJ SOLN: 25.0000 ug | INTRAMUSCULAR | Status: DC | PRN

## 2019-03-09 MED ORDER — ROCURONIUM BROMIDE 50 MG/5ML IV SOSY
PREFILLED_SYRINGE | INTRAVENOUS | Status: AC
  Filled 2019-03-09: qty 5

## 2019-03-09 MED ORDER — SUGAMMADEX SODIUM 200 MG/2ML IV SOLN
INTRAVENOUS | Status: DC | PRN
  Administered 2019-03-09: 400 mg via INTRAVENOUS

## 2019-03-09 MED ORDER — PHENYLEPHRINE 40 MCG/ML (10ML) SYRINGE FOR IV PUSH (FOR BLOOD PRESSURE SUPPORT)
PREFILLED_SYRINGE | INTRAVENOUS | Status: AC
  Filled 2019-03-09: qty 10

## 2019-03-09 MED ORDER — ONDANSETRON HCL 4 MG/2ML IJ SOLN
INTRAMUSCULAR | Status: AC
  Filled 2019-03-09: qty 2

## 2019-03-09 MED ORDER — OXYCODONE HCL 5 MG PO TABS: 5.0000 mg | ORAL_TABLET | Freq: Once | ORAL | Status: DC | PRN

## 2019-03-09 MED ORDER — DEXAMETHASONE SODIUM PHOSPHATE 10 MG/ML IJ SOLN
INTRAMUSCULAR | Status: AC
  Filled 2019-03-09: qty 1

## 2019-03-09 MED ORDER — DEXAMETHASONE SODIUM PHOSPHATE 10 MG/ML IJ SOLN
INTRAMUSCULAR | Status: DC | PRN
  Administered 2019-03-09: 10 mg via INTRAVENOUS

## 2019-03-09 MED ORDER — SUCCINYLCHOLINE CHLORIDE 200 MG/10ML IV SOSY
PREFILLED_SYRINGE | INTRAVENOUS | Status: AC
  Filled 2019-03-09: qty 10

## 2019-03-09 MED ORDER — PHENYLEPHRINE 40 MCG/ML (10ML) SYRINGE FOR IV PUSH (FOR BLOOD PRESSURE SUPPORT)
PREFILLED_SYRINGE | INTRAVENOUS | Status: DC | PRN
  Administered 2019-03-09: 80 ug via INTRAVENOUS

## 2019-03-09 MED ORDER — FENTANYL CITRATE (PF) 100 MCG/2ML IJ SOLN
INTRAMUSCULAR | Status: DC | PRN
  Administered 2019-03-09 (×2): 50 ug via INTRAVENOUS

## 2019-03-09 MED ORDER — ROCURONIUM BROMIDE 50 MG/5ML IV SOSY
PREFILLED_SYRINGE | INTRAVENOUS | Status: DC | PRN
  Administered 2019-03-09: 50 mg via INTRAVENOUS

## 2019-03-09 MED ORDER — PROPOFOL 10 MG/ML IV BOLUS
INTRAVENOUS | Status: AC
  Filled 2019-03-09: qty 40

## 2019-03-09 MED ORDER — SUCCINYLCHOLINE CHLORIDE 200 MG/10ML IV SOSY
PREFILLED_SYRINGE | INTRAVENOUS | Status: DC | PRN
  Administered 2019-03-09: 120 mg via INTRAVENOUS

## 2019-03-09 MED ORDER — 0.9 % SODIUM CHLORIDE (POUR BTL) OPTIME
TOPICAL | Status: DC | PRN
  Administered 2019-03-09: 1000 mL

## 2019-03-09 MED ORDER — METOPROLOL TARTRATE 12.5 MG HALF TABLET
ORAL_TABLET | ORAL | Status: AC
  Filled 2019-03-09: qty 1

## 2019-03-09 MED ORDER — LIDOCAINE 2% (20 MG/ML) 5 ML SYRINGE
INTRAMUSCULAR | Status: DC | PRN
  Administered 2019-03-09: 60 mg via INTRAVENOUS

## 2019-03-09 MED ORDER — ONDANSETRON HCL 4 MG/2ML IJ SOLN
INTRAMUSCULAR | Status: DC | PRN
  Administered 2019-03-09: 4 mg via INTRAVENOUS

## 2019-03-09 MED ORDER — FENTANYL CITRATE (PF) 250 MCG/5ML IJ SOLN
INTRAMUSCULAR | Status: AC
  Filled 2019-03-09: qty 5

## 2019-03-09 MED ORDER — LIDOCAINE 2% (20 MG/ML) 5 ML SYRINGE
INTRAMUSCULAR | Status: AC
  Filled 2019-03-09: qty 5

## 2019-03-09 MED ORDER — EPINEPHRINE PF 1 MG/ML IJ SOLN
INTRAMUSCULAR | Status: AC
  Filled 2019-03-09: qty 1

## 2019-03-09 MED ORDER — METOPROLOL TARTRATE 12.5 MG HALF TABLET
25.0000 mg | ORAL_TABLET | Freq: Once | ORAL | Status: AC
  Administered 2019-03-09: 25 mg via ORAL
  Filled 2019-03-09: qty 2

## 2019-03-09 MED ORDER — SODIUM CHLORIDE 0.9 % IV SOLN
INTRAVENOUS | Status: DC | PRN
  Administered 2019-03-09: 09:00:00 25 ug/min via INTRAVENOUS

## 2019-03-09 MED ORDER — PROPOFOL 10 MG/ML IV BOLUS
INTRAVENOUS | Status: DC | PRN
  Administered 2019-03-09: 130 mg via INTRAVENOUS

## 2019-03-09 MED ORDER — ONDANSETRON HCL 4 MG/2ML IJ SOLN: 4.0000 mg | Freq: Four times a day (QID) | INTRAMUSCULAR | Status: DC | PRN

## 2019-03-09 MED ORDER — LACTATED RINGERS IV SOLN
INTRAVENOUS | Status: DC | PRN
  Administered 2019-03-09: 08:00:00 via INTRAVENOUS

## 2019-03-09 MED ORDER — EPINEPHRINE PF 1 MG/ML IJ SOLN
INTRAMUSCULAR | Status: DC | PRN
  Administered 2019-03-09: 1 mg

## 2019-03-09 SURGICAL SUPPLY — 43 items
ADAPTER BRONCHOSCOPE OLYMPUS (ADAPTER) ×2 IMPLANT
ADAPTER VALVE BIOPSY EBUS (MISCELLANEOUS) IMPLANT
ADPTR VALVE BIOPSY EBUS (MISCELLANEOUS)
BRUSH BIOPSY BRONCH 10 SDTNB (MISCELLANEOUS) IMPLANT
BRUSH SUPERTRAX BIOPSY (INSTRUMENTS) IMPLANT
BRUSH SUPERTRAX NDL-TIP CYTO (INSTRUMENTS) ×2 IMPLANT
CANISTER SUCT 3000ML PPV (MISCELLANEOUS) ×2 IMPLANT
CHANNEL WORK EXTEND EDGE 180 (KITS) ×2 IMPLANT
CHANNEL WORK EXTEND EDGE 90 (KITS) IMPLANT
CONT SPEC 4OZ CLIKSEAL STRL BL (MISCELLANEOUS) ×6 IMPLANT
COVER BACK TABLE 60X90IN (DRAPES) ×2 IMPLANT
COVER WAND RF STERILE (DRAPES) IMPLANT
FILTER STRAW FLUID ASPIR (MISCELLANEOUS) ×2 IMPLANT
FORCEPS BIOP SUPERTRX PREMAR (INSTRUMENTS) ×2 IMPLANT
GAUZE SPONGE 4X4 12PLY STRL (GAUZE/BANDAGES/DRESSINGS) ×2 IMPLANT
GLOVE SURG SIGNA 7.5 PF LTX (GLOVE) ×2 IMPLANT
GOWN STRL REUS W/ TWL XL LVL3 (GOWN DISPOSABLE) ×1 IMPLANT
GOWN STRL REUS W/TWL XL LVL3 (GOWN DISPOSABLE) ×1
KIT CLEAN ENDO COMPLIANCE (KITS) ×2 IMPLANT
KIT PROCEDURE EDGE 180 (KITS) IMPLANT
KIT PROCEDURE EDGE 90 (KITS) IMPLANT
KIT TURNOVER KIT B (KITS) ×2 IMPLANT
MARKER FIDUCIAL SL NIT COIL (Implant Marker) ×6 IMPLANT
MARKER SKIN DUAL TIP RULER LAB (MISCELLANEOUS) ×2 IMPLANT
NEEDLE SUPERTRX PREMARK BIOPSY (NEEDLE) ×2 IMPLANT
NS IRRIG 1000ML POUR BTL (IV SOLUTION) ×2 IMPLANT
OIL SILICONE PENTAX (PARTS (SERVICE/REPAIRS)) ×2 IMPLANT
PAD ARMBOARD 7.5X6 YLW CONV (MISCELLANEOUS) ×4 IMPLANT
PATCHES PATIENT (LABEL) ×6 IMPLANT
SYR 20CC LL (SYRINGE) ×2 IMPLANT
SYR 20ML ECCENTRIC (SYRINGE) ×2 IMPLANT
SYR 30ML LL (SYRINGE) ×2 IMPLANT
SYR 5ML LL (SYRINGE) ×2 IMPLANT
SYRINGE 3CC LL L/F (MISCELLANEOUS) ×2 IMPLANT
TOWEL GREEN STERILE (TOWEL DISPOSABLE) ×2 IMPLANT
TOWEL GREEN STERILE FF (TOWEL DISPOSABLE) ×2 IMPLANT
TRAP SPECIMEN MUCOUS 40CC (MISCELLANEOUS) ×2 IMPLANT
TUBE CONNECTING 20X1/4 (TUBING) ×4 IMPLANT
UNDERPAD 30X30 (UNDERPADS AND DIAPERS) ×2 IMPLANT
VALVE BIOPSY  SINGLE USE (MISCELLANEOUS) ×1
VALVE BIOPSY SINGLE USE (MISCELLANEOUS) ×1 IMPLANT
VALVE SUCTION BRONCHIO DISP (MISCELLANEOUS) ×2 IMPLANT
WATER STERILE IRR 1000ML POUR (IV SOLUTION) ×2 IMPLANT

## 2019-03-09 NOTE — Addendum Note (Signed)
Encounter addended by: Hayden Pedro, PA-C on: 03/09/2019 3:06 PM  Actions taken: Clinical Note Signed, Delete clinical note

## 2019-03-09 NOTE — Interval H&P Note (Signed)
History and Physical Interval Note:  03/09/2019 8:22 AM  Robert Fisher  has presented today for surgery, with the diagnosis of LUL NODULE.  The various methods of treatment have been discussed with the patient and family. After consideration of risks, benefits and other options for treatment, the patient has consented to  Procedure(s): [employer] (N/A) PLACEMENT OF FUDUCIAL (N/A) as a surgical intervention.  The patient's history has been reviewed, patient examined, no change in status, stable for surgery.  I have reviewed the patient's chart and labs.  Questions were answered to the patient's satisfaction.     [name]

## 2019-03-09 NOTE — Op Note (Signed)
NAME: Robert Fisher, Robert Fisher MEDICAL RECORD MO:[id number] ACCOUNT [id number] DATE OF BIRTH:02-Jun-1934 FACILITY: [facility] LOCATION: [facility] PHYSICIAN:Georgeana Oertel Chaya Jan, MD  OPERATIVE REPORT  DATE OF PROCEDURE:  03/09/2019  PREOPERATIVE DIAGNOSIS:  Left upper lobe lung nodule.  POSTOPERATIVE DIAGNOSIS:  Non-small cell carcinoma, clinical stage IA (T1, N0).  PROCEDURE: Electromagnetic navigational bronchoscopy with needle aspirations, brushings, transbronchial biopsies and fiducial placement.  SURGEON:  Modesto Charon, MD  ASSISTANT:  None.  ANESTHESIA:  General.  FINDINGS:  The needle aspiration showed findings consistent with non-small cell carcinoma.  CLINICAL NOTE:  [name] is an 85 year old gentleman with a past history of tobacco abuse who presented with cough, shortness of breath and general malaise.  A chest x-ray showed a left upper lobe opacity.  He was treated for possible pneumonia but a  followup CT showed a spiculated nodule in the left upper lobe with borderline adenopathy.  On PET CT the nodule was hypermetabolic with an SUV of 65.0.  There was no evidence of regional or distant metastases.  He was not felt to be a surgical candidate.   He was advised to undergo navigational bronchoscopy for tissue diagnosis and fiducial placement in preparation for stereotactic radiation.  The indications, risks, benefits, and alternatives were discussed with the patient and his family.  He  understood and accepted the risks and agreed to proceed.  DESCRIPTION OF PROCEDURE:  The patient was brought to the operating room on 03/09/2019.  Planning with the [place] was done prior to the procedure.  He was anesthetized and intubated.  A timeout was performed.  Flexible fiberoptic  bronchoscopy was performed via the endotracheal tube.  It revealed normal endobronchial anatomy with no endobronchial lesions to the level of the subsegmental bronchi.  Locatable guide for  navigation was placed and registration was performed.  There was  good correlation of the video and virtual bronchoscopy.  The bronchoscope then was advanced to the left upper lobe bronchus and the appropriate subsegmental bronchus was cannulated.  The catheter was advanced to within 5 mm of the nodule with good alignment.   Needle aspirations were performed.  All sampling was done with fluoroscopy as was the fiducial placement.  Two needle aspirations were performed and then two needle brushings were performed.  Quick prep was performed on these specimens while multiple  biopsies were obtained.  The locatable guide was reinserted periodically to ensure continued good alignment and proximity to the nodule.  The needle aspiration showed non-small cell carcinoma.  The remainder of the specimens were sent for permanent  pathology.  After approximately a dozen biopsies have been obtained, mapping was performed for fiducial placement.  The computer generated 3 sites for fiducial placement.  The locatable guide was navigated to each of these in sequence and fiducials were placed at  the marked spots.  Inspection with fluoroscopy revealed no evidence of pneumothorax.  The bronchoscope was withdrawn.  The patient was extubated in the operating room and taken to the [facility] in good condition.  TN/NUANCE  D:03/09/2019 T:03/09/2019 JOB:[id number]

## 2019-03-09 NOTE — Brief Op Note (Signed)
03/09/2019  10:03 AM  PATIENT:  Robert Fisher  85 y.o. male  PRE-OPERATIVE DIAGNOSIS:  LUL NODULE  POST-OPERATIVE DIAGNOSIS: NON-SMALL CELL CARCINOMA, CLINICAL T1N0  PROCEDURE:  Procedure(s): VIDEO BRONCHOSCOPY WITH ENDOBRONCHIAL NAVIGATION (N/A) PLACEMENT OF FUDUCIAL (N/A) Needle aspirations, brushings and transbronchial biopsies  SURGEON:  Surgeon(s) and Role:    * [name], MD - Primary  PHYSICIAN ASSISTANT:   ASSISTANTS: none   ANESTHESIA:   general  EBL:  0 mL   BLOOD ADMINISTERED:none  DRAINS: none   LOCAL MEDICATIONS USED:  NONE  SPECIMEN:  Source of Specimen:  LUL nodule  DISPOSITION OF SPECIMEN:  PATHOLOGY  COUNTS:  NO endoscopic  TOURNIQUET:  * No tourniquets in log *  DICTATION: .Other Dictation: Dictation Number -  PLAN OF CARE: Discharge to home after PACU  PATIENT DISPOSITION:  PACU - hemodynamically stable.   Delay start of Pharmacological VTE agent (>24hrs) due to surgical blood loss or risk of bleeding: not applicable

## 2019-03-09 NOTE — Progress Notes (Signed)
Notified Dr. Marin Comment pt. Has a ring on right hand that will not come off.

## 2019-03-09 NOTE — Transfer of Care (Signed)
Immediate Anesthesia Transfer of Care Note  Patient: Robert Fisher  Procedure(s) Performed: VIDEO BRONCHOSCOPY WITH ENDOBRONCHIAL NAVIGATION (N/A Chest) PLACEMENT OF FUDUCIAL (N/A Chest)  Patient Location: PACU  Anesthesia Type:General  Level of Consciousness: awake, alert  and oriented  Airway & Oxygen Therapy: Patient Spontanous Breathing and Patient connected to face mask oxygen  Post-op Assessment: Report given to RN and Post -op Vital signs reviewed and stable  Post vital signs: Reviewed and stable  Last Vitals:  Vitals Value Taken Time  BP 139/70 03/09/2019  9:47 AM  Temp    Pulse 68 03/09/2019  9:47 AM  Resp 35 03/09/2019  9:47 AM  SpO2 100 % 03/09/2019  9:47 AM  Vitals shown include unvalidated device data.  Last Pain:  Vitals:   03/09/19 0730  PainSc: 0-No pain      Patients Stated Pain Goal: 5 (62/26/33 [id number])  Complications: No apparent anesthesia complications

## 2019-03-09 NOTE — Progress Notes (Signed)
Attestation signed by Kyung Rudd, MD at 03/08/2019 11:44 AM    Please see the note from Shona Simpson, PA-C from today's visit for more details of today's encounter.  I have personally performed a face to face diagnostic evaluation on this patient and devised the following assessment and plan.     The patient was seen today in clinic. The patient appears to be a good candidate for stereotactic body radiation treatment for the patient's diagnosis of lung cancer. He has met with thoracic surgery, and it was felt that SBRT may be a better option than resection given his comorbidities.  He is going to proceed with navigational bronchoscopy/ biopsy.  I discussed the rationale of such a treatment with the patient including the possible benefits as well as side effects and risks. All of the patient's questions were answered and the patient wishes to proceed with this treatment. I would anticipate 3-5 fractions of stereotactic body radiation treatment to the lung.     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 male seen at the request of Dr. Roxan Hockey for a newly noted lung nodule. Robert Fisher was noticing cough and weakness and on 12/06/2018 he underwent a CXR that showed mild patchy LUL opacity. He was treated with antibiotics and had a CT on 01/28/2019 that revealed a 15 x 18 x 22 Fisher solid spiculated mass in the LUL. He did have an 11 Fisher AP window node and a left hilar node measuring 9 Fisher. PET scan on 02/14/2019 revealed an SUV of 12.6 in the LUL nodule and no evidence of hypermetabolic  mediastinal or hilar adenopathy. He has low level uptake in the left axilla but this was felt to be reactive. He's seen today after meeting with Dr. Roxan Hockey who will proceed with bronchoscopy for diagnosis, but he is not a surgical candidate for resection.     PREVIOUS RADIATION THERAPY: No    Past Medical History:  Past Medical History:  Diagnosis Date   Borderline diabetes    takes Metformin   Cancer ([place])    lung 2020 per spouse LUL   COPD (chronic obstructive pulmonary disease) ([place])    DVT (deep venous thrombosis) ([facility])    Full dentures    Hearing aid worn    B/L   HOH (hard of hearing)    Hypertension    Left upper lobe pulmonary nodule    Memory problem    Pneumonia    Schizo affective schizophrenia ([place])     Past Surgical History: Past Surgical History:  Procedure Laterality Date   CATARACT EXTRACTION     COLONOSCOPY     THROMBECTOMY      Social History:  Social History   Socioeconomic History   Marital status: Married    Spouse name: Not on file   Number of children: Not on file   Years of education: Not on file   Highest education level: Not on file  Occupational History  Not on file  Social Needs   Financial resource strain: Not on file   Food insecurity:    Worry: Not on file    Inability: Not on file   Transportation needs:    Medical: Not on file    Non-medical: Not on file  Tobacco Use   Smoking status: Former Smoker    Packs/day: 1.00    Years: 50.00    Pack years: 50.00    Types: Cigarettes, Pipe    Last attempt to quit: 03/03/2015    Years since quitting: 4.0   Smokeless tobacco: Never Used   Tobacco comment: 30 yearsv cigarettes ; pipe 20 years  Substance and Sexual Activity   Alcohol use: No   Drug use: No   Sexual activity: Not on file  Lifestyle   Physical activity:    Days per week: Not on file    Minutes per session: Not on file   Stress: Not on file  Relationships   Social connections:     Talks on phone: Not on file    Gets together: Not on file    Attends religious service: Not on file    Active member of club or organization: Not on file    Attends meetings of clubs or organizations: Not on file    Relationship status: Not on file   Intimate partner violence:    Fear of current or ex partner: Not on file    Emotionally abused: Not on file    Physically abused: Not on file    Forced sexual activity: Not on file  Other Topics Concern   Not on file  Social History Narrative   Not on file    Family History:No family history on file.  ALLERGIES: Haloperidol lactate and Sildenafil   MEDICATIONS:  Current Outpatient Medications  Medication Sig Dispense Refill   acetaminophen (TYLENOL) 500 MG tablet Take 500-1,000 mg by mouth every 6 (six) hours as needed (FOR PAIN.).     aspirin EC 81 MG tablet Take 81 mg by mouth at bedtime.      atorvastatin (LIPITOR) 40 MG tablet Take 40 mg by mouth daily.   2   benzonatate (TESSALON) 100 MG capsule Take 1 capsule (100 mg total) by mouth every 8 (eight) hours. (Patient not taking: Reported on 03/04/2019) 21 capsule 0   buPROPion (WELLBUTRIN) 100 MG tablet Take 100 mg by mouth 2 (two) times daily.     clonazePAM (KLONOPIN) 0.5 MG tablet Take 0.5 mg by mouth 2 (two) times daily as needed for anxiety.     Emollient (CERAVE EX) Apply 1 application topically daily as needed (FOR DRY/IRRITATED SKIN OR RASH).      fenofibrate 160 MG tablet Take 160 mg by mouth daily.     hydrochlorothiazide (HYDRODIURIL) 12.5 MG tablet Take 12.5 mg by mouth daily.     metFORMIN (GLUCOPHAGE) 500 MG tablet Take 500 mg by mouth at bedtime.      metoprolol tartrate (LOPRESSOR) 25 MG tablet Take 25 mg by mouth every evening.     OLANZapine (ZYPREXA) 10 MG tablet Take 10 mg by mouth at bedtime.     Omega-3 Fatty Acids (FISH OIL PO) Take 1 capsule by mouth daily.     Polyethyl Glycol-Propyl Glycol (LUBRICANT EYE DROPS) 0.4-0.3 % SOLN Place 1  drop into both eyes 3 (three) times daily as needed (for dry/irritated eyes.). REFRESH OPTIVE MEGA-3     warfarin (COUMADIN) 5 MG tablet Take 2.5-5 mg by mouth See  admin instructions. Take 0.5 tablet (2.'5mg'$ ) on Mondays, Wednesdays, Fridays, and take 1 tablet  ('5mg'$ ) on Tuesday, Thursday, Saturday and Sunday     No current facility-administered medications for this encounter.       REVIEW OF SYSTEMS: On review of systems, the patient reports that he is doing well overall. He has had some redness in his eyes his family reports. He denies any chest pain, shortness of breath, cough, fevers, chills, night sweats. He has had a few pounds of weight loss. No other complaints are noted.        PHYSICAL EXAM:  Wt Readings from Last 3 Encounters:  03/03/19 201 lb 1.6 oz (91.2 kg)  03/03/19 201 lb 1.6 oz (91.2 kg)  01/25/13 205 lb (93 kg)   Temp Readings from Last 3 Encounters:  03/09/19 97.6 F (36.4 C)  03/03/19 97.6 F (36.4 C) (Oral)  03/03/19 97.6 F (36.4 C)   BP Readings from Last 3 Encounters:  03/09/19 (!) 129/59  03/03/19 (!) 157/65  03/03/19 (!) 157/65   Pulse Readings from Last 3 Encounters:  03/09/19 74  03/03/19 73  03/03/19 73   In general this is a well appearing caucasian male in no acute distress. He is alert and oriented x4 and appropriate throughout the examination. Cardiopulmonary assessment is negative for acute distress and he exhibits normal effort.    ECOG = 1  0 - Asymptomatic (Fully active, able to carry on all predisease activities without restriction) 1 - Symptomatic but completely ambulatory (Restricted in physically strenuous activity but ambulatory and able to carry out work of a light or sedentary nature. For example, light housework, office work) 2 - Symptomatic, <50% in bed during the day (Ambulatory and capable of all self care but unable to carry out any work activities. Up and about more than 50% of waking hours) 3 - Symptomatic, >50% in bed, but  not bedbound (Capable of only limited self-care, confined to bed or chair 50% or more of waking hours) 4 - Bedbound (Completely disabled. Cannot carry on any self-care. Totally confined to bed or chair) 5 - Death      Robert Fisher, Robert Fisher, Robert Fisher, Robert al. [phone]). "Toxicity and response criteria of the [facility]". [place] Oncol. 5 (6): 649-55    IMPRESSION/PLAN: 1.         Putative Stage IA, NSCLC of the left upper lobe. Dr. Lisbeth Renshaw discusses the pathology findings and reviews the nature of early stage lung cancer. He agrees with Dr. Leonarda Salon plans to proceed with navigational biopsy and then to proceed with stereotactic body radiotherapy (SBRT) to the left upper lobe target. Dr. Lisbeth Renshaw discusses that while surgery is the gold standard of treatment, radiation is an acceptable option for those who are not surgical candidates as Robert Fisher is not a candidate. We discussed the risks, benefits, short, and long term effects of radiotherapy, and the patient is interested in proceeding. Dr. Lisbeth Renshaw discusses the delivery and logistics of radiotherapy and anticipates a course of 3-5 fractions of radiotherapy. We will follow up with the results of his biopsy and proceed accordingly. 2.         Red Eyes. I couldn't examine him well but we discussed contacting his PCP for further evaluation.     In a visit lasting 25 minutes, greater than 50% of the time was spent face to face discussing his case, and coordinating the patient's care.    This encounter was provided by  telemedicine platform Web[employer].   The patient has given verbal consent for this type of encounter and has been advised to only accept a meeting of this type in a secure network environment.  The time spent during this encounter was 25 minutes.  The attendants for this meeting include Dr. Lisbeth Renshaw, Norton Blizzard, RN,  Hayden Pedro  and Jodene Nam. His wife and daughter were on the phone on  speaker.  During the encounter,Dr. Yvette Rack, RN, and  Hayden Pedro were located at [facility] Radiation Oncology Department.   Robert Fisher was located at [facility] in Medical Oncology.     The above documentation reflects my direct findings during this shared patient visit. Please see the separate note by Dr. Lisbeth Renshaw on this date for the remainder of the patient's plan of care.       Carola Rhine, PAC

## 2019-03-09 NOTE — Anesthesia Preprocedure Evaluation (Signed)
Anesthesia Evaluation  Patient identified by MRN, date of birth, ID band Patient awake    Reviewed: Allergy & Precautions, H&P , NPO status , Patient's Chart, lab work & pertinent test results  Airway Mallampati: II   Neck ROM: full    Dental   Pulmonary COPD, former smoker,  LUL nodule   breath sounds clear to auscultation       Cardiovascular hypertension, + Peripheral Vascular Disease   Rhythm:regular Rate:Normal     Neuro/Psych PSYCHIATRIC DISORDERS Schizophrenia    GI/Hepatic   Endo/Other    Renal/GU      Musculoskeletal   Abdominal   Peds  Hematology   Anesthesia Other Findings   Reproductive/Obstetrics                             Anesthesia Physical Anesthesia Plan  ASA: III  Anesthesia Plan: General   Post-op Pain Management:    Induction: Intravenous  PONV Risk Score and Plan: 2 and Ondansetron, Dexamethasone and Treatment may vary due to age or medical condition  Airway Management Planned: Oral ETT  Additional Equipment:   Intra-op Plan:   Post-operative Plan: Extubation in OR  Informed Consent: I have reviewed the patients History and Physical, chart, labs and discussed the procedure including the risks, benefits and alternatives for the proposed anesthesia with the patient or authorized representative who has indicated his/her understanding and acceptance.       Plan Discussed with: CRNA, Anesthesiologist and Surgeon  Anesthesia Plan Comments:         Anesthesia Quick Evaluation

## 2019-03-09 NOTE — Telephone Encounter (Signed)
I spoke with the patient's wife and daughter and reviewed the findings from his biopsy per Dr. Roxan Hockey. We reviewed the rationale for SBRT in 5 fxns per Dr. Lisbeth Renshaw and reviewed the plan to postpone simulation for a few weeks to allow for the upcoming coronavirus surge to pass. He was given an appt for 4/29 simulation.

## 2019-03-09 NOTE — Anesthesia Procedure Notes (Signed)
Procedure Name: Intubation Date/Time: 03/09/2019 8:41 AM Performed by: Candis Shine, CRNA Pre-anesthesia Checklist: Patient identified, Emergency Drugs available, Suction available and Patient being monitored Patient Re-evaluated:Patient Re-evaluated prior to induction Oxygen Delivery Method: Circle System Utilized Preoxygenation: Pre-oxygenation with 100% oxygen Induction Type: IV induction and Rapid sequence Laryngoscope Size: Mac and 4 Grade View: Grade II Tube type: Oral Tube size: 8.5 mm Number of attempts: 1 Airway Equipment and Method: Stylet Placement Confirmation: ETT inserted through vocal cords under direct vision,  positive ETCO2 and breath sounds checked- equal and bilateral Secured at: 23 cm Tube secured with: Tape Dental Injury: Teeth and Oropharynx as per pre-operative assessment

## 2019-03-10 ENCOUNTER — Encounter (HOSPITAL_COMMUNITY): Payer: Self-pay | Admitting: Thoracic Surgery (Cardiothoracic Vascular Surgery)

## 2019-03-10 NOTE — Anesthesia Postprocedure Evaluation (Signed)
Anesthesia Post Note  Patient: Robert Fisher  Procedure(s) Performed: VIDEO BRONCHOSCOPY WITH ENDOBRONCHIAL NAVIGATION (N/A Chest) PLACEMENT OF FUDUCIAL (N/A Chest)     Patient location during evaluation: PACU Anesthesia Type: General Level of consciousness: awake and alert Pain management: pain level controlled Vital Signs Assessment: post-procedure vital signs reviewed and stable Respiratory status: spontaneous breathing, nonlabored ventilation, respiratory function stable and patient connected to nasal cannula oxygen Cardiovascular status: blood pressure returned to baseline and stable Postop Assessment: no apparent nausea or vomiting Anesthetic complications: no    Last Vitals:  Vitals:   03/09/19 1047 03/09/19 1048  BP: (!) 129/59   Pulse: 75 74  Resp: 13 15  Temp:    SpO2: (!) 89% 91%    Last Pain:  Vitals:   03/09/19 1045  PainSc: 0-No pain                 Asuncion Shibata S

## 2019-03-15 DIAGNOSIS — C3412 Malignant neoplasm of upper lobe, left bronchus or lung: Secondary | ICD-10-CM | POA: Diagnosis not present

## 2019-03-15 DIAGNOSIS — Z7901 Long term (current) use of anticoagulants: Secondary | ICD-10-CM | POA: Diagnosis not present

## 2019-03-15 DIAGNOSIS — I8291 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: Secondary | ICD-10-CM | POA: Diagnosis not present

## 2019-03-15 DIAGNOSIS — F325 Major depressive disorder, single episode, in full remission: Secondary | ICD-10-CM | POA: Diagnosis not present

## 2019-03-23 DIAGNOSIS — F3181 Bipolar II disorder: Secondary | ICD-10-CM | POA: Diagnosis not present

## 2019-03-30 ENCOUNTER — Other Ambulatory Visit: Payer: Self-pay

## 2019-03-30 ENCOUNTER — Ambulatory Visit
Admission: RE | Admit: 2019-03-30 | Discharge: 2019-03-30 | Disposition: A | Discharge status: Home / Self Care | Payer: PPO | Source: Ambulatory Visit | Attending: Radiation Oncology | Admitting: Radiation Oncology

## 2019-03-30 ENCOUNTER — Telehealth: Payer: Self-pay | Admitting: Radiation Oncology

## 2019-03-30 DIAGNOSIS — C3412 Malignant neoplasm of upper lobe, left bronchus or lung: Secondary | ICD-10-CM | POA: Diagnosis not present

## 2019-03-30 DIAGNOSIS — C801 Malignant (primary) neoplasm, unspecified: Secondary | ICD-10-CM | POA: Insufficient documentation

## 2019-03-30 DIAGNOSIS — Z510 Encounter for antineoplastic radiation therapy: Secondary | ICD-10-CM | POA: Insufficient documentation

## 2019-03-30 DIAGNOSIS — C7802 Secondary malignant neoplasm of left lung: Secondary | ICD-10-CM | POA: Diagnosis not present

## 2019-03-30 NOTE — Telephone Encounter (Signed)
I spoke with the patient to obtain consent to move forward with treatment. I also called his wife and daughter to ensure they were comfortable with the plan to move forward with simulation.

## 2019-03-30 NOTE — Progress Notes (Signed)
[facility] Simulation and Treatment Planning Note   Name:  Robert Fisher MRN: [id number]   Date: 03/30/2019  DOB: [birth year]  Status:outpatient    DIAGNOSIS:    ICD-10-CM   1. 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  Identity was confirmed.  All relevant records and images related to the planned course of therapy were reviewed.  Then, the patient was positioned in a stable reproducible clinical set-up for radiation therapy. Abdominal compression was applied by me.  4D CT images were obtained and reproducible breathing pattern was confirmed. Free breathing CT images were obtained.  Skin markings were placed.  The CT images were loaded into the planning software where the target and avoidance structures were contoured.  The radiation prescription was entered and confirmed.    TREATMENT PLANNING NOTE:  Treatment planning then occurred. I have requested : IMRT planning.This treatment technique is medically necessary due to the high-dose of radiation delivered to the target region which is in close proximity to adjacent critical normal structures.  3 dimensional simulation is performed and dose volume histogram of the gross tumor volume, planning tumor volume and criticial normal structures including the spinal cord and lungs were analyzed and requested.  Special treatment procedure was performed due to high dose per fraction.  The patient will be monitored for increased risk of toxicity.  Daily imaging using cone beam CT will be used for target localization.  I anticipate that the patient will receive 54 Gy in 3 fractions to  target volume. Further adjustments will be made based on the planning process is necessary.  ------------------------------------------------  Jodelle Gross, MD, PhD

## 2019-04-07 DIAGNOSIS — C3412 Malignant neoplasm of upper lobe, left bronchus or lung: Secondary | ICD-10-CM | POA: Diagnosis not present

## 2019-04-07 DIAGNOSIS — C801 Malignant (primary) neoplasm, unspecified: Secondary | ICD-10-CM | POA: Insufficient documentation

## 2019-04-07 DIAGNOSIS — C7802 Secondary malignant neoplasm of left lung: Secondary | ICD-10-CM | POA: Insufficient documentation

## 2019-04-07 DIAGNOSIS — Z510 Encounter for antineoplastic radiation therapy: Secondary | ICD-10-CM | POA: Insufficient documentation

## 2019-04-11 ENCOUNTER — Ambulatory Visit
Admission: RE | Admit: 2019-04-11 | Discharge: 2019-04-11 | Disposition: A | Discharge status: Home / Self Care | Payer: PPO | Source: Ambulatory Visit | Attending: Radiation Oncology | Admitting: Radiation Oncology

## 2019-04-11 ENCOUNTER — Other Ambulatory Visit: Payer: Self-pay

## 2019-04-11 DIAGNOSIS — Z510 Encounter for antineoplastic radiation therapy: Secondary | ICD-10-CM | POA: Diagnosis not present

## 2019-04-12 DIAGNOSIS — R600 Localized edema: Secondary | ICD-10-CM | POA: Diagnosis not present

## 2019-04-12 DIAGNOSIS — Z7901 Long term (current) use of anticoagulants: Secondary | ICD-10-CM | POA: Diagnosis not present

## 2019-04-12 DIAGNOSIS — C3412 Malignant neoplasm of upper lobe, left bronchus or lung: Secondary | ICD-10-CM | POA: Diagnosis not present

## 2019-04-12 DIAGNOSIS — N183 Chronic kidney disease, stage 3 (moderate): Secondary | ICD-10-CM | POA: Diagnosis not present

## 2019-04-12 DIAGNOSIS — I8291 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: Secondary | ICD-10-CM | POA: Diagnosis not present

## 2019-04-13 ENCOUNTER — Ambulatory Visit
Admission: RE | Admit: 2019-04-13 | Discharge: 2019-04-13 | Disposition: A | Discharge status: Home / Self Care | Payer: PPO | Source: Ambulatory Visit | Attending: Radiation Oncology | Admitting: Radiation Oncology

## 2019-04-13 ENCOUNTER — Encounter: Payer: Self-pay | Admitting: Radiation Oncology

## 2019-04-13 ENCOUNTER — Other Ambulatory Visit: Payer: Self-pay

## 2019-04-13 DIAGNOSIS — C7802 Secondary malignant neoplasm of left lung: Secondary | ICD-10-CM

## 2019-04-13 DIAGNOSIS — Z510 Encounter for antineoplastic radiation therapy: Secondary | ICD-10-CM | POA: Diagnosis not present

## 2019-04-13 DIAGNOSIS — C801 Malignant (primary) neoplasm, unspecified: Secondary | ICD-10-CM

## 2019-04-13 NOTE — Progress Notes (Signed)
The patient was seen today prior to completion of his SBRT to the Left upper lobe. The patient has completed 2 of the 3 fractions of therapy.  He will complete his third fraction on Monday.   His wife had called earlier this week wanting his cancer policy filled out, and this message was sent to our financial navigators to help complete the paperwork.  I discussed with him the plan would be to proceed with a post treatment CT scan in approximately 6 weeks time.  I have also communicated this with his wife.  Orders were placed for this, and I will follow-up with him in approximately 6 weeks time by telephone to review these results and check on him following his treatment.    Carola Rhine, PAC

## 2019-04-18 ENCOUNTER — Ambulatory Visit
Admission: RE | Admit: 2019-04-18 | Discharge: 2019-04-18 | Disposition: A | Discharge status: Home / Self Care | Payer: PPO | Source: Ambulatory Visit | Attending: Radiation Oncology | Admitting: Radiation Oncology

## 2019-04-18 ENCOUNTER — Other Ambulatory Visit: Payer: Self-pay

## 2019-04-18 ENCOUNTER — Encounter: Payer: Self-pay | Admitting: Radiation Oncology

## 2019-04-18 DIAGNOSIS — C3412 Malignant neoplasm of upper lobe, left bronchus or lung: Secondary | ICD-10-CM | POA: Diagnosis not present

## 2019-04-18 DIAGNOSIS — Z510 Encounter for antineoplastic radiation therapy: Secondary | ICD-10-CM | POA: Diagnosis not present

## 2019-04-19 DIAGNOSIS — I872 Venous insufficiency (chronic) (peripheral): Secondary | ICD-10-CM | POA: Diagnosis not present

## 2019-04-19 DIAGNOSIS — R600 Localized edema: Secondary | ICD-10-CM | POA: Diagnosis not present

## 2019-04-19 DIAGNOSIS — Z7901 Long term (current) use of anticoagulants: Secondary | ICD-10-CM | POA: Diagnosis not present

## 2019-04-19 DIAGNOSIS — C3412 Malignant neoplasm of upper lobe, left bronchus or lung: Secondary | ICD-10-CM | POA: Diagnosis not present

## 2019-04-19 DIAGNOSIS — N183 Chronic kidney disease, stage 3 (moderate): Secondary | ICD-10-CM | POA: Diagnosis not present

## 2019-04-19 DIAGNOSIS — I129 Hypertensive chronic kidney disease with stage 1 through stage 4 chronic kidney disease, or unspecified chronic kidney disease: Secondary | ICD-10-CM | POA: Diagnosis not present

## 2019-04-21 DIAGNOSIS — N183 Chronic kidney disease, stage 3 (moderate): Secondary | ICD-10-CM | POA: Diagnosis not present

## 2019-04-21 DIAGNOSIS — Z7901 Long term (current) use of anticoagulants: Secondary | ICD-10-CM | POA: Diagnosis not present

## 2019-04-21 DIAGNOSIS — C3412 Malignant neoplasm of upper lobe, left bronchus or lung: Secondary | ICD-10-CM | POA: Diagnosis not present

## 2019-04-21 DIAGNOSIS — I129 Hypertensive chronic kidney disease with stage 1 through stage 4 chronic kidney disease, or unspecified chronic kidney disease: Secondary | ICD-10-CM | POA: Diagnosis not present

## 2019-04-21 DIAGNOSIS — I8291 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: Secondary | ICD-10-CM | POA: Diagnosis not present

## 2019-04-21 DIAGNOSIS — R600 Localized edema: Secondary | ICD-10-CM | POA: Diagnosis not present

## 2019-05-03 DIAGNOSIS — L570 Actinic keratosis: Secondary | ICD-10-CM | POA: Diagnosis not present

## 2019-05-03 DIAGNOSIS — Z85820 Personal history of malignant melanoma of skin: Secondary | ICD-10-CM | POA: Diagnosis not present

## 2019-05-03 DIAGNOSIS — L738 Other specified follicular disorders: Secondary | ICD-10-CM | POA: Diagnosis not present

## 2019-05-03 DIAGNOSIS — D692 Other nonthrombocytopenic purpura: Secondary | ICD-10-CM | POA: Diagnosis not present

## 2019-05-03 DIAGNOSIS — L821 Other seborrheic keratosis: Secondary | ICD-10-CM | POA: Diagnosis not present

## 2019-05-03 DIAGNOSIS — D1801 Hemangioma of skin and subcutaneous tissue: Secondary | ICD-10-CM | POA: Diagnosis not present

## 2019-05-03 DIAGNOSIS — Z85828 Personal history of other malignant neoplasm of skin: Secondary | ICD-10-CM | POA: Diagnosis not present

## 2019-05-19 DIAGNOSIS — Z7901 Long term (current) use of anticoagulants: Secondary | ICD-10-CM | POA: Diagnosis not present

## 2019-05-19 DIAGNOSIS — I8291 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: Secondary | ICD-10-CM | POA: Diagnosis not present

## 2019-05-19 DIAGNOSIS — R600 Localized edema: Secondary | ICD-10-CM | POA: Diagnosis not present

## 2019-05-26 ENCOUNTER — Telehealth: Payer: Self-pay | Admitting: *Deleted

## 2019-05-26 NOTE — Telephone Encounter (Signed)
CALLED PATIENT TO INFORM OF LAB FOR 05-31-19 @ 11:30 AM @ [facility] AND HIS CT ON 05-31-19 - ARRIVAL TIME - 12:15 PM @ [facility], PT. TO HAVE WATER ONLY - 4 HRS. PRIOR TO TEST, ALISON PERKINS TO CALL ON 06-01-19 WITH RESULTS, SPOKE WITH PATIENT'S WIFE- JOY AND SHE IS AWARE OF THESE APPTS.

## 2019-05-31 ENCOUNTER — Ambulatory Visit (HOSPITAL_COMMUNITY)
Admission: RE | Admit: 2019-05-31 | Discharge: 2019-05-31 | Disposition: A | Discharge status: Home / Self Care | Payer: PPO | Source: Ambulatory Visit | Attending: Radiation Oncology | Admitting: Radiation Oncology

## 2019-05-31 ENCOUNTER — Other Ambulatory Visit: Payer: Self-pay

## 2019-05-31 ENCOUNTER — Encounter (HOSPITAL_COMMUNITY): Payer: Self-pay

## 2019-05-31 ENCOUNTER — Ambulatory Visit
Admission: RE | Admit: 2019-05-31 | Discharge: 2019-05-31 | Disposition: A | Discharge status: Home / Self Care | Payer: PPO | Source: Ambulatory Visit | Attending: Radiation Oncology | Admitting: Radiation Oncology

## 2019-05-31 DIAGNOSIS — Z510 Encounter for antineoplastic radiation therapy: Secondary | ICD-10-CM | POA: Insufficient documentation

## 2019-05-31 DIAGNOSIS — C7802 Secondary malignant neoplasm of left lung: Secondary | ICD-10-CM | POA: Diagnosis not present

## 2019-05-31 DIAGNOSIS — R918 Other nonspecific abnormal finding of lung field: Secondary | ICD-10-CM | POA: Diagnosis not present

## 2019-05-31 DIAGNOSIS — I700 Atherosclerosis of aorta: Secondary | ICD-10-CM | POA: Diagnosis not present

## 2019-05-31 DIAGNOSIS — C801 Malignant (primary) neoplasm, unspecified: Secondary | ICD-10-CM | POA: Insufficient documentation

## 2019-05-31 DIAGNOSIS — K8020 Calculus of gallbladder without cholecystitis without obstruction: Secondary | ICD-10-CM | POA: Diagnosis not present

## 2019-05-31 DIAGNOSIS — Q2546 Tortuous aortic arch: Secondary | ICD-10-CM | POA: Diagnosis not present

## 2019-05-31 DIAGNOSIS — J439 Emphysema, unspecified: Secondary | ICD-10-CM | POA: Diagnosis not present

## 2019-05-31 DIAGNOSIS — C3412 Malignant neoplasm of upper lobe, left bronchus or lung: Secondary | ICD-10-CM | POA: Diagnosis not present

## 2019-05-31 LAB — BUN & CREATININE (CHCC)
BUN: 13 mg/dL (ref 8–23)
Creatinine: 1.37 mg/dL — ABNORMAL HIGH (ref 0.61–1.24)
GFR, Est AFR Am: 55 mL/min — ABNORMAL LOW (ref >60)
GFR, Estimated: 48 mL/min — ABNORMAL LOW (ref >60)

## 2019-05-31 MED ORDER — SODIUM CHLORIDE (PF) 0.9 % IJ SOLN
INTRAMUSCULAR | Status: AC
  Filled 2019-05-31: qty 50

## 2019-05-31 MED ORDER — IOHEXOL 300 MG/ML  SOLN
75.0000 mL | Freq: Once | INTRAMUSCULAR | Status: AC | PRN
  Administered 2019-05-31: 75 mL via INTRAVENOUS

## 2019-06-01 ENCOUNTER — Ambulatory Visit
Admission: RE | Admit: 2019-06-01 | Discharge: 2019-06-01 | Disposition: A | Discharge status: Home / Self Care | Payer: PPO | Source: Ambulatory Visit | Attending: Radiation Oncology | Admitting: Radiation Oncology

## 2019-06-01 DIAGNOSIS — C7802 Secondary malignant neoplasm of left lung: Secondary | ICD-10-CM

## 2019-06-01 DIAGNOSIS — C801 Malignant (primary) neoplasm, unspecified: Secondary | ICD-10-CM

## 2019-06-02 ENCOUNTER — Other Ambulatory Visit: Payer: Self-pay | Admitting: Radiation Oncology

## 2019-06-02 DIAGNOSIS — C7802 Secondary malignant neoplasm of left lung: Secondary | ICD-10-CM

## 2019-06-02 DIAGNOSIS — C801 Malignant (primary) neoplasm, unspecified: Secondary | ICD-10-CM

## 2019-06-02 NOTE — Progress Notes (Signed)
  Radiation Oncology         [phone] ________________________________  Name: Robert Fisher MRN: [id number]  Date of Service: 06/01/2019  DOB: 08-22-1934  Post Treatment Telephone Note  Diagnosis:  Putative Stage IA, NSCLC of the left upper lobe  Interval Since Last Radiation:  6 weeks   04/11/2019-04/18/2019 SBRT Treatement: The LUL was treated to 54 Gy in 3 fractions  Narrative:  The patient tolerated SBRT radiotherapy. He did not have any untoward side effects. He underwent reimaging with CT chest on 05/31/2019 that showed improvement in the treated lesion, no new lesions, and no adenopathy.  Impression/Plan: 1. Putative Stage IA, NSCLC of the left upper lobe. The patient reports he's doing well since treatment. We reviewed his most recent CT scan. I discussed the recommendation to proceed with repeat scan in 6 months. We will contact him closer to the time it's due to schedule and follow up with him to review the results. He's encouraged to contact us if he has questions or concerns prior to that visit.      Carola Rhine, PAC

## 2019-06-15 DIAGNOSIS — I129 Hypertensive chronic kidney disease with stage 1 through stage 4 chronic kidney disease, or unspecified chronic kidney disease: Secondary | ICD-10-CM | POA: Diagnosis not present

## 2019-06-15 DIAGNOSIS — N183 Chronic kidney disease, stage 3 (moderate): Secondary | ICD-10-CM | POA: Diagnosis not present

## 2019-06-15 DIAGNOSIS — Z7901 Long term (current) use of anticoagulants: Secondary | ICD-10-CM | POA: Diagnosis not present

## 2019-06-15 DIAGNOSIS — I8291 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: Secondary | ICD-10-CM | POA: Diagnosis not present

## 2019-06-15 DIAGNOSIS — R600 Localized edema: Secondary | ICD-10-CM | POA: Diagnosis not present

## 2019-06-16 NOTE — Progress Notes (Signed)
  Radiation Oncology         [phone] ________________________________  Name: Robert Fisher MRN: [id number]  Date: 04/18/2019  DOB: [birth year]  End of Treatment Note  Diagnosis:   85 y.o. male with Putative Stage IA, NSCLC of the left upper lobe   Indication for treatment:  Curative       Radiation treatment dates:   04/11/2019, 04/13/2019, 04/18/2019  Site/dose:   The tumor in the LUL lung was treated with a course of stereotactic body radiation treatment. The patient received 54 Gy in 3 fractions at 18 Gy per fraction.  Beams/energy:   SBRT/SRT-VMAT // 6X-FFF Photon  Narrative: The patient tolerated radiation treatment relatively well.   The patient did not have any signs of acute toxicity during treatment.  Plan: The patient has completed radiation treatment. The patient will return to radiation oncology clinic for routine followup in one month. I advised the patient to call or return sooner if they have any questions or concerns related to their recovery or treatment.   ------------------------------------------------  Jodelle Gross, MD, PhD  This document serves as a record of services personally performed by Kyung Rudd, MD. It was created on his behalf by Rae Lips, a trained medical scribe. The creation of this record is based on the scribe's personal observations and the provider's statements to them. This document has been checked and approved by the attending provider.

## 2019-07-12 DIAGNOSIS — I8291 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: Secondary | ICD-10-CM | POA: Diagnosis not present

## 2019-07-12 DIAGNOSIS — Z7901 Long term (current) use of anticoagulants: Secondary | ICD-10-CM | POA: Diagnosis not present

## 2019-08-11 DIAGNOSIS — R82998 Other abnormal findings in urine: Secondary | ICD-10-CM | POA: Diagnosis not present

## 2019-08-11 DIAGNOSIS — E7849 Other hyperlipidemia: Secondary | ICD-10-CM | POA: Diagnosis not present

## 2019-08-11 DIAGNOSIS — E1122 Type 2 diabetes mellitus with diabetic chronic kidney disease: Secondary | ICD-10-CM | POA: Diagnosis not present

## 2019-08-11 DIAGNOSIS — Z125 Encounter for screening for malignant neoplasm of prostate: Secondary | ICD-10-CM | POA: Diagnosis not present

## 2019-08-18 DIAGNOSIS — D692 Other nonthrombocytopenic purpura: Secondary | ICD-10-CM | POA: Diagnosis not present

## 2019-08-18 DIAGNOSIS — Z7901 Long term (current) use of anticoagulants: Secondary | ICD-10-CM | POA: Diagnosis not present

## 2019-08-18 DIAGNOSIS — E1122 Type 2 diabetes mellitus with diabetic chronic kidney disease: Secondary | ICD-10-CM | POA: Diagnosis not present

## 2019-08-18 DIAGNOSIS — F209 Schizophrenia, unspecified: Secondary | ICD-10-CM | POA: Diagnosis not present

## 2019-08-18 DIAGNOSIS — I8291 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: Secondary | ICD-10-CM | POA: Diagnosis not present

## 2019-08-18 DIAGNOSIS — H04129 Dry eye syndrome of unspecified lacrimal gland: Secondary | ICD-10-CM | POA: Diagnosis not present

## 2019-08-18 DIAGNOSIS — C3412 Malignant neoplasm of upper lobe, left bronchus or lung: Secondary | ICD-10-CM | POA: Diagnosis not present

## 2019-08-18 DIAGNOSIS — R600 Localized edema: Secondary | ICD-10-CM | POA: Diagnosis not present

## 2019-08-18 DIAGNOSIS — I129 Hypertensive chronic kidney disease with stage 1 through stage 4 chronic kidney disease, or unspecified chronic kidney disease: Secondary | ICD-10-CM | POA: Diagnosis not present

## 2019-08-18 DIAGNOSIS — Z0000 Encounter for general adult medical examination without abnormal findings: Secondary | ICD-10-CM | POA: Diagnosis not present

## 2019-08-18 DIAGNOSIS — F1320 Sedative, hypnotic or anxiolytic dependence, uncomplicated: Secondary | ICD-10-CM | POA: Diagnosis not present

## 2019-08-18 DIAGNOSIS — N183 Chronic kidney disease, stage 3 (moderate): Secondary | ICD-10-CM | POA: Diagnosis not present

## 2019-09-28 DIAGNOSIS — Z7901 Long term (current) use of anticoagulants: Secondary | ICD-10-CM | POA: Diagnosis not present

## 2019-09-28 DIAGNOSIS — I8291 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: Secondary | ICD-10-CM | POA: Diagnosis not present

## 2019-09-28 DIAGNOSIS — Z23 Encounter for immunization: Secondary | ICD-10-CM | POA: Diagnosis not present

## 2019-10-13 DIAGNOSIS — I872 Venous insufficiency (chronic) (peripheral): Secondary | ICD-10-CM | POA: Diagnosis not present

## 2019-10-13 DIAGNOSIS — I8291 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: Secondary | ICD-10-CM | POA: Diagnosis not present

## 2019-10-13 DIAGNOSIS — M6281 Muscle weakness (generalized): Secondary | ICD-10-CM | POA: Diagnosis not present

## 2019-10-13 DIAGNOSIS — C3412 Malignant neoplasm of upper lobe, left bronchus or lung: Secondary | ICD-10-CM | POA: Diagnosis not present

## 2019-10-13 DIAGNOSIS — Z7901 Long term (current) use of anticoagulants: Secondary | ICD-10-CM | POA: Diagnosis not present

## 2019-10-13 DIAGNOSIS — M1990 Unspecified osteoarthritis, unspecified site: Secondary | ICD-10-CM | POA: Diagnosis not present

## 2019-10-13 DIAGNOSIS — R2689 Other abnormalities of gait and mobility: Secondary | ICD-10-CM | POA: Diagnosis not present

## 2019-10-13 DIAGNOSIS — Z9189 Other specified personal risk factors, not elsewhere classified: Secondary | ICD-10-CM | POA: Diagnosis not present

## 2019-10-20 DIAGNOSIS — E1122 Type 2 diabetes mellitus with diabetic chronic kidney disease: Secondary | ICD-10-CM | POA: Diagnosis not present

## 2019-10-20 DIAGNOSIS — R42 Dizziness and giddiness: Secondary | ICD-10-CM | POA: Diagnosis not present

## 2019-10-20 DIAGNOSIS — R569 Unspecified convulsions: Secondary | ICD-10-CM | POA: Diagnosis not present

## 2019-10-20 DIAGNOSIS — R627 Adult failure to thrive: Secondary | ICD-10-CM | POA: Diagnosis not present

## 2019-10-20 DIAGNOSIS — N1831 Chronic kidney disease, stage 3a: Secondary | ICD-10-CM | POA: Diagnosis not present

## 2019-10-20 DIAGNOSIS — Z7901 Long term (current) use of anticoagulants: Secondary | ICD-10-CM | POA: Diagnosis not present

## 2019-10-20 DIAGNOSIS — C3412 Malignant neoplasm of upper lobe, left bronchus or lung: Secondary | ICD-10-CM | POA: Diagnosis not present

## 2019-10-20 DIAGNOSIS — I129 Hypertensive chronic kidney disease with stage 1 through stage 4 chronic kidney disease, or unspecified chronic kidney disease: Secondary | ICD-10-CM | POA: Diagnosis not present

## 2019-10-20 DIAGNOSIS — F209 Schizophrenia, unspecified: Secondary | ICD-10-CM | POA: Diagnosis not present

## 2019-11-09 ENCOUNTER — Encounter: Payer: Self-pay | Admitting: *Deleted

## 2019-11-10 DIAGNOSIS — Z9189 Other specified personal risk factors, not elsewhere classified: Secondary | ICD-10-CM | POA: Diagnosis not present

## 2019-11-10 DIAGNOSIS — R569 Unspecified convulsions: Secondary | ICD-10-CM | POA: Diagnosis not present

## 2019-11-10 DIAGNOSIS — M6281 Muscle weakness (generalized): Secondary | ICD-10-CM | POA: Diagnosis not present

## 2019-11-15 DIAGNOSIS — Z7901 Long term (current) use of anticoagulants: Secondary | ICD-10-CM | POA: Diagnosis not present

## 2019-11-15 DIAGNOSIS — I8291 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: Secondary | ICD-10-CM | POA: Diagnosis not present

## 2019-11-16 DIAGNOSIS — L570 Actinic keratosis: Secondary | ICD-10-CM | POA: Diagnosis not present

## 2019-11-16 DIAGNOSIS — Z85820 Personal history of malignant melanoma of skin: Secondary | ICD-10-CM | POA: Diagnosis not present

## 2019-11-16 DIAGNOSIS — L812 Freckles: Secondary | ICD-10-CM | POA: Diagnosis not present

## 2019-11-16 DIAGNOSIS — Z85828 Personal history of other malignant neoplasm of skin: Secondary | ICD-10-CM | POA: Diagnosis not present

## 2019-11-16 DIAGNOSIS — L218 Other seborrheic dermatitis: Secondary | ICD-10-CM | POA: Diagnosis not present

## 2019-11-16 DIAGNOSIS — L821 Other seborrheic keratosis: Secondary | ICD-10-CM | POA: Diagnosis not present

## 2019-12-13 ENCOUNTER — Telehealth: Payer: Self-pay | Admitting: *Deleted

## 2019-12-13 NOTE — Telephone Encounter (Signed)
Called patient to inform of labs for 12-14-19 - @ 12:15 pm @ Robert Fisher and his Ct for 12-14-19 - arrival time- 1:15 pm @ [facility], patient to be NPO- 4 hrs. prior to test, spoke with patient and he verified understanding these appts.

## 2019-12-14 ENCOUNTER — Encounter (HOSPITAL_COMMUNITY): Payer: Self-pay

## 2019-12-14 ENCOUNTER — Other Ambulatory Visit: Payer: Self-pay

## 2019-12-14 ENCOUNTER — Ambulatory Visit
Admission: RE | Admit: 2019-12-14 | Discharge: 2019-12-14 | Disposition: A | Discharge status: Home / Self Care | Payer: Medicare Other | Source: Ambulatory Visit | Attending: Radiation Oncology | Admitting: Radiation Oncology

## 2019-12-14 ENCOUNTER — Ambulatory Visit (HOSPITAL_COMMUNITY)
Admission: RE | Admit: 2019-12-14 | Discharge: 2019-12-14 | Disposition: A | Discharge status: Home / Self Care | Payer: Medicare Other | Source: Ambulatory Visit | Attending: Radiation Oncology | Admitting: Radiation Oncology

## 2019-12-14 DIAGNOSIS — C3412 Malignant neoplasm of upper lobe, left bronchus or lung: Secondary | ICD-10-CM | POA: Insufficient documentation

## 2019-12-14 DIAGNOSIS — C801 Malignant (primary) neoplasm, unspecified: Secondary | ICD-10-CM

## 2019-12-14 DIAGNOSIS — C7802 Secondary malignant neoplasm of left lung: Secondary | ICD-10-CM | POA: Diagnosis not present

## 2019-12-14 LAB — BUN & CREATININE (CHCC)
BUN: 19 mg/dL (ref 8–23)
Creatinine: 1.61 mg/dL — ABNORMAL HIGH (ref 0.61–1.24)
GFR, Est AFR Am: 45 mL/min — ABNORMAL LOW (ref >60)
GFR, Estimated: 39 mL/min — ABNORMAL LOW (ref >60)

## 2019-12-14 MED ORDER — SODIUM CHLORIDE (PF) 0.9 % IJ SOLN
INTRAMUSCULAR | Status: AC
  Filled 2019-12-14: qty 50

## 2019-12-14 MED ORDER — IOHEXOL 300 MG/ML  SOLN
75.0000 mL | Freq: Once | INTRAMUSCULAR | Status: AC | PRN
  Administered 2019-12-14: 60 mL via INTRAVENOUS

## 2019-12-15 ENCOUNTER — Telehealth: Payer: Self-pay | Admitting: Radiation Oncology

## 2019-12-15 ENCOUNTER — Ambulatory Visit
Admission: RE | Admit: 2019-12-15 | Discharge: 2019-12-15 | Disposition: A | Discharge status: Home / Self Care | Payer: Medicare Other | Source: Ambulatory Visit | Attending: Radiation Oncology | Admitting: Radiation Oncology

## 2019-12-15 DIAGNOSIS — C7802 Secondary malignant neoplasm of left lung: Secondary | ICD-10-CM

## 2019-12-15 DIAGNOSIS — C801 Malignant (primary) neoplasm, unspecified: Secondary | ICD-10-CM

## 2019-12-15 NOTE — Telephone Encounter (Signed)
I left messages for the patient, his wife and daughter Robert Fisher to call us back to review his CT scan results.

## 2019-12-15 NOTE — Progress Notes (Signed)
Radiation Oncology         [phone] ________________________________  Initial Outpatient Consultation - Conducted via telephone due to current COVID-19 concerns for limiting patient exposure  I spoke with the patient to conduct this consult visit via telephone to spare the patient unnecessary potential exposure in the healthcare setting during the current COVID-19 pandemic. The patient was notified in advance and was offered a [place] meeting to allow for face to face communication but unfortunately reported that they did not have the appropriate resources/technology to support such a visit and instead preferred to proceed with a telephone consult.  ________________________________  Name: Robert Fisher MRN: [id number]  Date of Service: 12/15/2019  DOB: [birth year]  Post Treatment Follow Up  Diagnosis:  Stage IA, NSCLC,  squamous cell carcinoma of the left upper lobe  Interval Since Last Radiation: 8 months  04/11/2019-04/18/2019 SBRT Treatement: The LUL was treated to 54 Gy in 3 fractions  Narrative:  In summary, this is a pleasant  86 y.o. gentleman with a  Stage IA, NSCLC, squamous cell carcinoma of the left upper lobe. Robert Fisher was noticing cough and weakness and on 12/06/2018 he underwent a CXR that showed mild patchy LUL opacity. He was treated with antibiotics and had a CT on 01/28/2019 that revealed a 15 x 18 x 22 mm solid spiculated mass in the LUL. He did have an 11 mm AP window node and a left hilar node measuring 9 mm. PET scan on 02/14/2019 revealed an SUV of 12.6 in the LUL nodule and no evidence of hypermetabolic mediastinal or hilar adenopathy. He has low level uptake in the left axilla but this was felt to be reactive. He did undergo bronchoscopy  On 03/09/2019 which revealed a squamous cell carcinoma. He was not a good surgical candidate for resection so he proceeded with 3 fractions of SBRT which were completed in May 2020. He had a post treatment CT that was stable to  improved, and his most recent on 12/14/19 showed improvement in the treated site of the LUL now measuring 8 mm, previously 17 mm. Other LLL nodules that are being followed were also stable, and he had stable cholelithiasis, emphysematous changes, and atherosclerotic changes in the aorta. He is contacted today to review his CT results.    PAST MEDICAL HISTORY:  Past Medical History:  Diagnosis Date  . Borderline diabetes    takes Metformin  . Cancer ([place])    lung 2020 per spouse LUL  . COPD (chronic obstructive pulmonary disease) ([place])   . DVT (deep venous thrombosis) ([place])   . Full dentures   . Hearing aid worn    B/L  . HOH (hard of hearing)   . Hypertension   . Left upper lobe pulmonary nodule   . Memory problem   . Pneumonia   . Schizo affective schizophrenia ([place])     PAST SURGICAL HISTORY: Past Surgical History:  Procedure Laterality Date  . CATARACT EXTRACTION    . COLONOSCOPY    . FUDUCIAL PLACEMENT N/A 03/09/2019   Procedure: PLACEMENT OF FUDUCIAL;  Surgeon: [name], MD;  Location: [place];  Service: Thoracic;  Laterality: N/A;  . THROMBECTOMY    . VIDEO BRONCHOSCOPY WITH ENDOBRONCHIAL NAVIGATION N/A 03/09/2019   Procedure: VIDEO BRONCHOSCOPY WITH ENDOBRONCHIAL NAVIGATION;  Surgeon: [name], MD;  Location: [place];  Service: Thoracic;  Laterality: N/A;    PAST SOCIAL HISTORY:  Social History   Socioeconomic History  . Marital status: Married  Spouse name: Not on file  . Number of children: Not on file  . Years of education: Not on file  . Highest education level: Not on file  Occupational History  . Not on file  Tobacco Use  . Smoking status: Former Smoker    Packs/day: 1.00    Years: 50.00    Pack years: 50.00    Types: Cigarettes, Pipe    Quit date: 03/03/2015    Years since quitting: 4.7  . Smokeless tobacco: Never Used  . Tobacco comment: 30 yearsv cigarettes ; pipe 20 years  Substance and Sexual Activity  . Alcohol use: No  .  Drug use: No  . Sexual activity: Not on file  Other Topics Concern  . Not on file  Social History Narrative  . Not on file   Social Determinants of Health   Financial Resource Strain:   . Difficulty of Paying Living Expenses: Not on file  Food Insecurity:   . Worried About Charity fundraiser in the Last Year: Not on file  . Ran Out of Food in the Last Year: Not on file  Transportation Needs:   . Lack of Transportation (Medical): Not on file  . Lack of Transportation (Non-Medical): Not on file  Physical Activity:   . Days of Exercise per Week: Not on file  . Minutes of Exercise per Session: Not on file  Stress:   . Feeling of Stress : Not on file  Social Connections:   . Frequency of Communication with Friends and Family: Not on file  . Frequency of Social Gatherings with Friends and Family: Not on file  . Attends Religious Services: Not on file  . Active Member of Clubs or Organizations: Not on file  . Attends Archivist Meetings: Not on file  . Marital Status: Not on file  Intimate Partner Violence:   . Fear of Current or Ex-Partner: Not on file  . Emotionally Abused: Not on file  . Physically Abused: Not on file  . Sexually Abused: Not on file  The patient is married and lives in [place]. He's accompanied on the call by his wife and daughter Robert Fisher.  PAST FAMILY HISTORY:No family history on file.  MEDICATIONS  Current Outpatient Medications  Medication Sig Dispense Refill  . acetaminophen (TYLENOL) 500 MG tablet Take 500-1,000 mg by mouth every 6 (six) hours as needed (FOR PAIN.).    Marland Kitchen aspirin EC 81 MG tablet Take 81 mg by mouth at bedtime.     Marland Kitchen atorvastatin (LIPITOR) 40 MG tablet Take 40 mg by mouth daily.   2  . benzonatate (TESSALON) 100 MG capsule Take 1 capsule (100 mg total) by mouth every 8 (eight) hours. (Patient not taking: Reported on 03/04/2019) 21 capsule 0  . buPROPion (WELLBUTRIN) 100 MG tablet Take 100 mg by mouth 2 (two) times daily.    .  clonazePAM (KLONOPIN) 0.5 MG tablet Take 0.5 mg by mouth 2 (two) times daily as needed for anxiety.    . Emollient (CERAVE EX) Apply 1 application topically daily as needed (FOR DRY/IRRITATED SKIN OR RASH).     . fenofibrate 160 MG tablet Take 160 mg by mouth daily.    . hydrochlorothiazide (HYDRODIURIL) 12.5 MG tablet Take 12.5 mg by mouth daily.    . metFORMIN (GLUCOPHAGE) 500 MG tablet Take 500 mg by mouth at bedtime.     . metoprolol tartrate (LOPRESSOR) 25 MG tablet Take 25 mg by mouth every evening.    Marland Kitchen  OLANZapine (ZYPREXA) 10 MG tablet Take 10 mg by mouth at bedtime.    . Omega-3 Fatty Acids (FISH OIL PO) Take 1 capsule by mouth daily.    Vladimir Faster Glycol-Propyl Glycol (LUBRICANT EYE DROPS) 0.4-0.3 % SOLN Place 1 drop into both eyes 3 (three) times daily as needed (for dry/irritated eyes.). REFRESH OPTIVE MEGA-3    . warfarin (COUMADIN) 5 MG tablet Take 2.5-5 mg by mouth See admin instructions. Take 0.5 tablet (2.5mg ) on Mondays, Wednesdays, Fridays, and take 1 tablet  (5mg ) on Tuesday, Thursday, Saturday and Sunday     No current facility-administered medications for this encounter.    ALLERGIES:  Allergies  Allergen Reactions  . Haloperidol Lactate Other (See Comments)    "made him climb the walls."  . Sildenafil Other (See Comments)    "pain in right thigh."   Physical Exam: Unable to assess due to encounter type.   Impression/Plan: 1. Stage IA, NSCLC,  squamous cell carcinoma of the left upper lobe. The patient reports he's doing well and we reviewed the favorable findings on his recent CT imaging and the rationale to proceed with repeat imaging in 6 months time. We also discussed that if his health were to deteriorate given his age, and if getting out to appointments was not feasible, we could change our goals for his care. At this time however, he reports he feels well enough that if another area were to be found he would want to proceed with more treatment. We will  continue to keep this in mind as we follow him according to guidelines.   Given current concerns for patient exposure during the COVID-19 pandemic, this encounter was conducted via telephone.  The patient has given verbal consent for this type of encounter. The time spent during this encounter was 22 minutes and 50% of that time was spent in the coordination of his care. The attendants for this meeting include Shona Simpson, [facility] and TERREN JANDREAU and his wife and daughter Robert Fisher.  During the encounter, Shona Simpson [facility] was located at [facility] Radiation Oncology Department.  Jodene Nam and his wife and daughter Robert Fisher were located at home.    Carola Rhine, PAC

## 2019-12-19 DIAGNOSIS — I700 Atherosclerosis of aorta: Secondary | ICD-10-CM | POA: Insufficient documentation

## 2019-12-22 ENCOUNTER — Ambulatory Visit: Payer: Medicare Other | Attending: Internal Medicine

## 2019-12-22 DIAGNOSIS — Z23 Encounter for immunization: Secondary | ICD-10-CM | POA: Insufficient documentation

## 2019-12-22 NOTE — Progress Notes (Signed)
   Covid-19 Vaccination Clinic  Name:  Robert Fisher    MRN: [id number] DOB: June 21, 1934  12/22/2019  [name] was observed post Covid-19 immunization for 15 minutes without incidence. He was provided with Vaccine Information Sheet and instruction to access the V-Safe system.   [name] was instructed to call 911 with any severe reactions post vaccine: Marland Kitchen Difficulty breathing  . Swelling of your face and throat  . A fast heartbeat  . A bad rash all over your body  . Dizziness and weakness    Immunizations Administered    Name Date Dose VIS Date Route   Pfizer COVID-19 Vaccine 12/22/2019  4:44 PM 0.3 mL 11/11/2019 Intramuscular   Manufacturer: [place]   Lot: [id number]   [place]: [id number]

## 2020-01-11 ENCOUNTER — Ambulatory Visit: Payer: Medicare Other | Attending: Internal Medicine

## 2020-01-11 DIAGNOSIS — Z23 Encounter for immunization: Secondary | ICD-10-CM | POA: Insufficient documentation

## 2020-01-11 NOTE — Progress Notes (Signed)
   Covid-19 Vaccination Clinic  Name:  LEEMON AYALA    MRN: [id number] DOB: March 24, 1934  01/11/2020  Mr. Gaillard was observed post Covid-19 immunization for 15 minutes without incidence. He was provided with Vaccine Information Sheet and instruction to access the V-Safe system.   Mr. Ayo was instructed to call 911 with any severe reactions post vaccine: Marland Kitchen Difficulty breathing  . Swelling of your face and throat  . A fast heartbeat  . A bad rash all over your body  . Dizziness and weakness    Immunizations Administered    Name Date Dose VIS Date Route   Pfizer COVID-19 Vaccine 01/11/2020  3:17 PM 0.3 mL 11/11/2019 Intramuscular   Manufacturer: [place]   Lot: [id number]   [place]: [id number]

## 2020-01-13 ENCOUNTER — Ambulatory Visit: Payer: Medicare Other

## 2020-03-19 DIAGNOSIS — H6120 Impacted cerumen, unspecified ear: Secondary | ICD-10-CM | POA: Insufficient documentation

## 2020-04-09 ENCOUNTER — Encounter (INDEPENDENT_AMBULATORY_CARE_PROVIDER_SITE_OTHER): Payer: Self-pay | Admitting: Otolaryngology

## 2020-04-09 ENCOUNTER — Ambulatory Visit (INDEPENDENT_AMBULATORY_CARE_PROVIDER_SITE_OTHER): Payer: Medicare Other | Admitting: Otolaryngology

## 2020-04-09 ENCOUNTER — Other Ambulatory Visit: Payer: Self-pay

## 2020-04-09 VITALS — Temp 97.2°F

## 2020-04-09 DIAGNOSIS — H903 Sensorineural hearing loss, bilateral: Secondary | ICD-10-CM | POA: Diagnosis not present

## 2020-04-09 DIAGNOSIS — H6123 Impacted cerumen, bilateral: Secondary | ICD-10-CM

## 2020-04-09 NOTE — Progress Notes (Signed)
HPI: Robert Fisher is a 86 y.o. male who presents for evaluation of cerumen buildup in his ear canals.  He has longstanding hearing loss and has hearing aids but lost one of them.  On recent exam he had wax obstructing both ear canals..  Past Medical History:  Diagnosis Date  . Borderline diabetes    takes Metformin  . Cancer ([place])    lung 2020 per spouse LUL  . COPD (chronic obstructive pulmonary disease) ([place])   . DVT (deep venous thrombosis) ([place])   . Full dentures   . Hearing aid worn    B/L  . HOH (hard of hearing)   . Hypertension   . Left upper lobe pulmonary nodule   . Memory problem   . Pneumonia   . Schizo affective schizophrenia [facility])    Past Surgical History:  Procedure Laterality Date  . CATARACT EXTRACTION    . COLONOSCOPY    . FUDUCIAL PLACEMENT N/A 03/09/2019   Procedure: PLACEMENT OF FUDUCIAL;  Surgeon: [name], MD;  Location: [place];  Service: Thoracic;  Laterality: N/A;  . THROMBECTOMY    . VIDEO BRONCHOSCOPY WITH ENDOBRONCHIAL NAVIGATION N/A 03/09/2019   Procedure: VIDEO BRONCHOSCOPY WITH ENDOBRONCHIAL NAVIGATION;  Surgeon: [name], MD;  Location: [place];  Service: Thoracic;  Laterality: N/A;   Social History   Socioeconomic History  . Marital status: Married    Spouse name: Not on file  . Number of children: Not on file  . Years of education: Not on file  . Highest education level: Not on file  Occupational History  . Not on file  Tobacco Use  . Smoking status: Former Smoker    Packs/day: 1.00    Years: 50.00    Pack years: 50.00    Types: Cigarettes, Pipe    Start date: 27    Quit date: 03/03/2015    Years since quitting: 5.1  . Smokeless tobacco: Never Used  . Tobacco comment: 30 yearsv cigarettes ; pipe 20 years  Substance and Sexual Activity  . Alcohol use: No  . Drug use: No  . Sexual activity: Not on file  Other Topics Concern  . Not on file  Social History Narrative  . Not on file   Social Determinants  of Health   Financial Resource Strain:   . Difficulty of Paying Living Expenses:   Food Insecurity:   . Worried About Charity fundraiser in the Last Year:   . Arboriculturist in the Last Year:   Transportation Needs:   . Film/video editor (Medical):   Marland Kitchen Lack of Transportation (Non-Medical):   Physical Activity:   . Days of Exercise per Week:   . Minutes of Exercise per Session:   Stress:   . Feeling of Stress :   Social Connections:   . Frequency of Communication with Friends and Family:   . Frequency of Social Gatherings with Friends and Family:   . Attends Religious Services:   . Active Member of Clubs or Organizations:   . Attends Archivist Meetings:   Marland Kitchen Marital Status:    No family history on file. Allergies  Allergen Reactions  . Haloperidol Lactate Other (See Comments)    "made him climb the walls."  . Sildenafil Other (See Comments)    "pain in right thigh."   Prior to Admission medications   Medication Sig Start Date End Date Taking? Authorizing Provider  acetaminophen (TYLENOL) 500 MG tablet Take 500-1,000  mg by mouth every 6 (six) hours as needed (FOR PAIN.).   Yes [provider]  aspirin EC 81 MG tablet Take 81 mg by mouth at bedtime.    Yes [provider]  atorvastatin (LIPITOR) 40 MG tablet Take 40 mg by mouth daily.  09/14/15  Yes [provider]  benzonatate (TESSALON) 100 MG capsule Take 1 capsule (100 mg total) by mouth every 8 (eight) hours. 12/06/18  Yes Volanda Napoleon, PA-C  buPROPion (WELLBUTRIN) 100 MG tablet Take 100 mg by mouth 2 (two) times daily.   Yes [provider]  clonazePAM (KLONOPIN) 0.5 MG tablet Take 0.5 mg by mouth 2 (two) times daily as needed for anxiety.   Yes [provider]  Emollient (CERAVE EX) Apply 1 application topically daily as needed (FOR DRY/IRRITATED SKIN OR RASH).    Yes [provider]  fenofibrate 160 MG tablet Take 160 mg by mouth daily.   Yes  [provider]  hydrochlorothiazide (HYDRODIURIL) 12.5 MG tablet Take 12.5 mg by mouth daily.   Yes [provider]  metFORMIN (GLUCOPHAGE) 500 MG tablet Take 500 mg by mouth at bedtime.    Yes [provider]  metoprolol tartrate (LOPRESSOR) 25 MG tablet Take 25 mg by mouth every evening.   Yes [provider]  OLANZapine (ZYPREXA) 10 MG tablet Take 10 mg by mouth at bedtime.   Yes [provider]  Omega-3 Fatty Acids (FISH OIL PO) Take 1 capsule by mouth daily.   Yes [provider]  Polyethyl Glycol-Propyl Glycol (LUBRICANT EYE DROPS) 0.4-0.3 % SOLN Place 1 drop into both eyes 3 (three) times daily as needed (for dry/irritated eyes.). REFRESH OPTIVE MEGA-3   Yes [provider]  warfarin (COUMADIN) 5 MG tablet Take 2.5-5 mg by mouth See admin instructions. Take 0.5 tablet (2.5mg ) on Mondays, Wednesdays, Fridays, and take 1 tablet  (5mg ) on Tuesday, Thursday, Saturday and Sunday   Yes [provider]     Positive ROS: Otherwise negative  All other systems have been reviewed and were otherwise negative with the exception of those mentioned in the HPI and as above.  Physical Exam: Constitutional: Alert, well-appearing, no acute distress Ears: External ears without lesions or tenderness. Ear canals both ear canals are occluded with cerumen that was removed with suction and curettes.  Several hairs were also removed with forceps.  Both TMs were clear otherwise.. Nasal: External nose without lesions. Clear nasal passages Oral: Oropharynx clear. Neck: No palpable adenopathy or masses Respiratory: Breathing comfortably  Skin: No facial/neck lesions or rash noted.  Cerumen impaction removal  Date/Time: 04/09/2020 1:53 PM Performed by: Rozetta Nunnery, MD Authorized by: Rozetta Nunnery, MD   Consent:    Consent obtained:  Verbal   Consent given by:  Patient   Risks discussed:  Pain and bleeding Procedure  details:    Location:  L ear and R ear   Procedure type: curette, suction and forceps   Post-procedure details:    Inspection:  TM intact and canal normal   Hearing quality:  Improved   Patient tolerance of procedure:  Tolerated well, no immediate complications Comments:     TMs are clear bilaterally.    Assessment: Bilateral cerumen impaction. Bilateral SNHL  Plan: Ear canals were cleaned in the office.  He is scheduled to follow-up with [facility] in 3 to 4 weeks for recheck and possible new hearing aids.  Radene Journey, MD

## 2020-04-17 DIAGNOSIS — E876 Hypokalemia: Secondary | ICD-10-CM | POA: Insufficient documentation

## 2020-04-23 ENCOUNTER — Emergency Department (HOSPITAL_COMMUNITY)
Admission: EM | Admit: 2020-04-23 | Discharge: 2020-04-23 | Disposition: A | Discharge status: Home / Self Care | Payer: Medicare Other | Attending: Emergency Medicine | Admitting: Emergency Medicine

## 2020-04-23 ENCOUNTER — Emergency Department (HOSPITAL_COMMUNITY): Payer: Medicare Other

## 2020-04-23 ENCOUNTER — Encounter (HOSPITAL_COMMUNITY): Payer: Self-pay | Admitting: Emergency Medicine

## 2020-04-23 ENCOUNTER — Other Ambulatory Visit: Payer: Self-pay

## 2020-04-23 DIAGNOSIS — Z7982 Long term (current) use of aspirin: Secondary | ICD-10-CM | POA: Insufficient documentation

## 2020-04-23 DIAGNOSIS — W19XXXD Unspecified fall, subsequent encounter: Secondary | ICD-10-CM | POA: Insufficient documentation

## 2020-04-23 DIAGNOSIS — Z7901 Long term (current) use of anticoagulants: Secondary | ICD-10-CM | POA: Insufficient documentation

## 2020-04-23 DIAGNOSIS — Z87891 Personal history of nicotine dependence: Secondary | ICD-10-CM | POA: Insufficient documentation

## 2020-04-23 DIAGNOSIS — J449 Chronic obstructive pulmonary disease, unspecified: Secondary | ICD-10-CM | POA: Insufficient documentation

## 2020-04-23 DIAGNOSIS — Z79899 Other long term (current) drug therapy: Secondary | ICD-10-CM | POA: Insufficient documentation

## 2020-04-23 DIAGNOSIS — M436 Torticollis: Secondary | ICD-10-CM

## 2020-04-23 DIAGNOSIS — I10 Essential (primary) hypertension: Secondary | ICD-10-CM | POA: Insufficient documentation

## 2020-04-23 DIAGNOSIS — Z7984 Long term (current) use of oral hypoglycemic drugs: Secondary | ICD-10-CM | POA: Diagnosis not present

## 2020-04-23 DIAGNOSIS — M542 Cervicalgia: Secondary | ICD-10-CM | POA: Diagnosis present

## 2020-04-23 LAB — CBC WITH DIFFERENTIAL/PLATELET
Abs Immature Granulocytes: 0.04 10*3/uL (ref 0.00–0.07)
Basophils Absolute: 0 10*3/uL (ref 0.0–0.1)
Basophils Relative: 0 %
Eosinophils Absolute: 0.3 10*3/uL (ref 0.0–0.5)
Eosinophils Relative: 3 %
HCT: 51.5 % (ref 39.0–52.0)
Hemoglobin: 16.3 g/dL (ref 13.0–17.0)
Immature Granulocytes: 1 %
Lymphocytes Relative: 29 %
Lymphs Abs: 2.5 10*3/uL (ref 0.7–4.0)
MCH: 29.7 pg (ref 26.0–34.0)
MCHC: 31.7 g/dL (ref 30.0–36.0)
MCV: 93.8 fL (ref 80.0–100.0)
Monocytes Absolute: 0.8 10*3/uL (ref 0.1–1.0)
Monocytes Relative: 10 %
Neutro Abs: 4.9 10*3/uL (ref 1.7–7.7)
Neutrophils Relative %: 57 %
Platelets: 246 10*3/uL (ref 150–400)
RBC: 5.49 MIL/uL (ref 4.22–5.81)
RDW: 14.8 % (ref 11.5–15.5)
WBC: 8.6 10*3/uL (ref 4.0–10.5)
nRBC: 0 % (ref 0.0–0.2)

## 2020-04-23 LAB — BASIC METABOLIC PANEL
Anion gap: 12 (ref 5–15)
BUN: 15 mg/dL (ref 8–23)
CO2: 26 mmol/L (ref 22–32)
Calcium: 9.7 mg/dL (ref 8.9–10.3)
Chloride: 101 mmol/L (ref 98–111)
Creatinine, Ser: 1.23 mg/dL (ref 0.61–1.24)
GFR calc Af Amer: >60 mL/min (ref >60)
GFR calc non Af Amer: 54 mL/min — ABNORMAL LOW (ref >60)
Glucose, Bld: 153 mg/dL — ABNORMAL HIGH (ref 70–99)
Potassium: 3 mmol/L — ABNORMAL LOW (ref 3.5–5.1)
Sodium: 139 mmol/L (ref 135–145)

## 2020-04-23 LAB — I-STAT CREATININE, ED: Creatinine, Ser: 1.1 mg/dL (ref 0.61–1.24)

## 2020-04-23 LAB — PROTIME-INR
INR: 3.2 — ABNORMAL HIGH (ref 0.8–1.2)
Prothrombin Time: 32 seconds — ABNORMAL HIGH (ref 11.4–15.2)

## 2020-04-23 LAB — MAGNESIUM: Magnesium: 2.1 mg/dL (ref 1.7–2.4)

## 2020-04-23 MED ORDER — HYDROCODONE-ACETAMINOPHEN 5-325 MG PO TABS: 1.0000 | ORAL_TABLET | ORAL | 0 refills | Status: DC | PRN

## 2020-04-23 MED ORDER — POTASSIUM CHLORIDE CRYS ER 20 MEQ PO TBCR
40.0000 meq | EXTENDED_RELEASE_TABLET | Freq: Once | ORAL | Status: AC
  Administered 2020-04-23: 40 meq via ORAL
  Filled 2020-04-23: qty 2

## 2020-04-23 MED ORDER — IOHEXOL 300 MG/ML  SOLN
75.0000 mL | Freq: Once | INTRAMUSCULAR | Status: AC | PRN
  Administered 2020-04-23: 75 mL via INTRAVENOUS

## 2020-04-23 MED ORDER — CYCLOBENZAPRINE HCL 10 MG PO TABS
5.0000 mg | ORAL_TABLET | Freq: Once | ORAL | Status: AC
  Administered 2020-04-23: 5 mg via ORAL
  Filled 2020-04-23: qty 1

## 2020-04-23 MED ORDER — HYDROCODONE-ACETAMINOPHEN 5-325 MG PO TABS
1.0000 | ORAL_TABLET | Freq: Once | ORAL | Status: AC
  Administered 2020-04-23: 1 via ORAL
  Filled 2020-04-23: qty 1

## 2020-04-23 NOTE — ED Notes (Signed)
Patient and daughter verbalize understanding of discharge instructions. Opportunity for questioning and answers were provided. Armband removed by staff, pt discharged from ED. Wheeled out to lobby

## 2020-04-23 NOTE — Discharge Instructions (Signed)
Apply heat and muscle cream/[place] patches to L shoulder/neck muscles and apply gentle massage to help loosen muscles. Move neck and shoulders around as much as possible to help improve pain faster. Return to ER if any arm/leg weakness/numbness or stroke like symptoms.

## 2020-04-23 NOTE — ED Triage Notes (Signed)
Pt in from home via [facility] w/neck pain, had fall on 5/9. Does take Warfarin. Denies any numbness, tingling, was ambulatory on scene. States the neck pain has gotten worse x 2 days. Arrives w/c-collar, a&ox4

## 2020-04-23 NOTE — ED Provider Notes (Signed)
[facility] Provider Note   CSN: [id number] Arrival date & time: 04/23/20  0355     History Chief Complaint  Patient presents with  . Fall  . Neck Pain    [facility] is a 86 y.o. male.  86yo M w/ PMH including COPD, memory problems, lung CA, DVT on coumadin, HTN who p/w neck pain. Daughter states that patient began complaining of L sided neck pain randomly yesterday and she initially thought he had a crick in his neck. She applied salanpas and muscle cream and gave him tylenol. He keeps saying he had a recent fall but she notes that he had a fall 5/9. This neck pain didn't start until yesterday, however, and she doubts it had anything to do with that fall. Pt denies any extremity weakness/numbness, headache, vision changes. Daughter does note that he complained of swallowing problems yesterday but later was able to eat a full dinner including hot dogs.   LEVEL 5 CAVEAT DUE TO MEMORY PROBLEMS  The history is provided by the patient and a relative.  Fall  Neck Pain      Past Medical History:  Diagnosis Date  . Borderline diabetes    takes Metformin  . Cancer ([place])    lung 2020 per spouse LUL  . COPD (chronic obstructive pulmonary disease) ([place])   . DVT (deep venous thrombosis) ([place])   . Full dentures   . Hearing aid worn    B/L  . HOH (hard of hearing)   . Hypertension   . Left upper lobe pulmonary nodule   . Memory problem   . Pneumonia   . Schizo affective schizophrenia [facility])     Patient Active Problem List   Diagnosis Date Noted  . 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 Lung nodule 03/03/2019  . ADVEF, DRUG/MEDICINAL/BIOLOGICAL SUBST NOS 08/30/2007  . HYPERLIPIDEMIA NEC/NOS 06/08/2007  . SCHIZOPHRENIA, PARANOID, UNSPECIFIED 03/19/2007  . PERIPHERAL VASCULAR DISEASE 03/19/2007  . SKIN CANCER, HX OF 03/19/2007  . PULMONARY EMBOLISM, HX OF 03/19/2007     Past Surgical History:  Procedure Laterality Date  . CATARACT EXTRACTION    . COLONOSCOPY    . FUDUCIAL PLACEMENT N/A 03/09/2019   Procedure: PLACEMENT OF FUDUCIAL;  Surgeon: [name], MD;  Location: [place];  Service: Thoracic;  Laterality: N/A;  . THROMBECTOMY    . VIDEO BRONCHOSCOPY WITH ENDOBRONCHIAL NAVIGATION N/A 03/09/2019   Procedure: VIDEO BRONCHOSCOPY WITH ENDOBRONCHIAL NAVIGATION;  Surgeon: [name], MD;  Location: [place];  Service: Thoracic;  Laterality: N/A;       No family history on file.  Social History   Tobacco Use  . Smoking status: Former Smoker    Packs/day: 1.00    Years: 50.00    Pack years: 50.00    Types: Cigarettes, Pipe    Start date: 34    Quit date: 03/03/2015    Years since quitting: 5.1  . Smokeless tobacco: Never Used  . Tobacco comment: 30 yearsv cigarettes ; pipe 20 years  Substance Use Topics  . Alcohol use: No  . Drug use: No    Home Medications Prior to Admission medications   Medication Sig Start Date End Date Taking? Authorizing Provider  acetaminophen (TYLENOL) 500 MG tablet Take 500-1,000 mg by mouth every 6 (six) hours as needed (FOR PAIN.).   Yes [provider]  aspirin EC 81 MG tablet Take 81 mg  by mouth every evening.    Yes [provider]  atorvastatin (LIPITOR) 40 MG tablet Take 40 mg by mouth every evening.  09/14/15  Yes [provider]  buPROPion (WELLBUTRIN) 100 MG tablet Take 100 mg by mouth 2 (two) times daily.   Yes [provider]  Capsaicin-Menthol (SALONPAS GEL-PATCH HOT EX) Apply 1 application topically daily as needed (For back pain).   Yes [provider]  clonazePAM (KLONOPIN) 0.5 MG tablet Take 0.25 mg by mouth 2 (two) times daily.    Yes [provider]  fenofibrate 160 MG tablet Take 160 mg by mouth daily.   Yes [provider]  furosemide (LASIX) 20 MG tablet Take 20 mg by mouth See admin instructions. Take 1 tablet by  mouth on Monday Wednesday and Friday   Yes [provider]  hydrochlorothiazide (HYDRODIURIL) 12.5 MG tablet Take 12.5 mg by mouth daily.   Yes [provider]  hydrocortisone cream 1 % Apply 1 application topically daily as needed for itching (For rash).   Yes [provider]  metFORMIN (GLUCOPHAGE) 500 MG tablet Take 500 mg by mouth in the morning, at noon, and at bedtime.    Yes [provider]  metoprolol tartrate (LOPRESSOR) 25 MG tablet Take 25 mg by mouth daily.    Yes [provider]  Multiple Vitamin (MULTIVITAMIN WITH MINERALS) TABS tablet Take 1 tablet by mouth daily.   Yes [provider]  OLANZapine (ZYPREXA) 10 MG tablet Take 10 mg by mouth at bedtime.   Yes [provider]  Omega-3 Fatty Acids (FISH OIL PO) Take 1 capsule by mouth daily.   Yes [provider]  Polyethyl Glycol-Propyl Glycol (SYSTANE FREE OP) Place 1 drop into both eyes in the morning and at bedtime.   Yes [provider]  potassium chloride SA (KLOR-CON) 20 MEQ tablet Take 10 mEq by mouth See admin instructions. Take one tablet (10mg ) by mouth on Mon,Wed and Friday per patient   Yes [provider]  Probiotic Product (PROBIOTIC PO) Take 1 tablet by mouth every evening.   Yes [provider]  warfarin (COUMADIN) 5 MG tablet Take 2.5-5 mg by mouth See admin instructions. Take 5mg  tablet by mouth on Mondays, Wednesdays, Fridays, and take 2.5mg  tablet by mouth  on Tuesday, Thursday, Saturday and Sunday   Yes [provider]  benzonatate (TESSALON) 100 MG capsule Take 1 capsule (100 mg total) by mouth every 8 (eight) hours. Patient not taking: Reported on 04/23/2020 12/06/18   Volanda Napoleon, PA-C  HYDROcodone-acetaminophen (NORCO/VICODIN) 5-325 MG tablet Take 1 tablet by mouth every 4 (four) hours as needed. 04/23/20   Tamekia Rotter, Wenda Overland, MD    Allergies    Haloperidol lactate and Sildenafil  Review of Systems    Review of Systems  Unable to perform ROS: Dementia  Musculoskeletal: Positive for neck pain.     Physical Exam Updated Vital Signs BP (!) 153/66   Pulse 72   Temp 97.8 F (36.6 C) (Oral)   Resp (!) 22   Wt 91.2 kg   SpO2 94%   BMI 30.57 kg/m   Physical Exam Vitals and nursing note reviewed.  Constitutional:      General: He is not in acute distress.    Appearance: He is well-developed.  HENT:     Head: Normocephalic and atraumatic.     Mouth/Throat:     Mouth: Mucous membranes are moist.     Pharynx: Oropharynx is clear.  No posterior oropharyngeal erythema.  Eyes:     Extraocular Movements: Extraocular movements intact.     Conjunctiva/sclera: Conjunctivae normal.     Pupils: Pupils are equal, round, and reactive to light.  Neck:     Comments: Trachea midline, no focal neck swelling Cardiovascular:     Rate and Rhythm: Normal rate and regular rhythm.     Heart sounds: Normal heart sounds. No murmur.  Pulmonary:     Effort: Pulmonary effort is normal.     Breath sounds: Normal breath sounds.  Abdominal:     General: Bowel sounds are normal. There is no distension.     Palpations: Abdomen is soft.     Tenderness: There is no abdominal tenderness.  Musculoskeletal:     Cervical back: Neck supple.     Comments: Exquisite tenderness to L trapezius muscles to posterior L base of neck  Skin:    General: Skin is warm and dry.  Neurological:     Mental Status: He is alert and oriented to person, place, and time.     Cranial Nerves: No cranial nerve deficit.     Motor: No weakness.     Comments: Fluent speech  Psychiatric:        Judgment: Judgment normal.     ED Results / Procedures / Treatments   Labs (all labs ordered are listed, but only abnormal results are displayed) Labs Reviewed  BASIC METABOLIC PANEL - Abnormal; Notable for the following components:      Result Value   Potassium 3.0 (*)    Glucose, Bld 153 (*)    GFR calc non Af Amer 54 (*)    All  other components within normal limits  PROTIME-INR - Abnormal; Notable for the following components:   Prothrombin Time 32.0 (*)    INR 3.2 (*)    All other components within normal limits  CBC WITH DIFFERENTIAL/PLATELET  MAGNESIUM  I-STAT CREATININE, ED    EKG None  Radiology CT Soft Tissue Neck W Contrast  Result Date: 04/23/2020 CLINICAL DATA:  Neck pain.  History of non-small cell lung cancer EXAM: CT NECK WITH CONTRAST TECHNIQUE: Multidetector CT imaging of the neck was performed using the standard protocol following the bolus administration of intravenous contrast. CONTRAST:  54mL OMNIPAQUE IOHEXOL 300 MG/ML  SOLN COMPARISON:  None. FINDINGS: Pharynx and larynx: Normal. No mass or swelling. Salivary glands: No inflammation, mass, or stone. Thyroid: Small thyroid gland.  No thyroid mass. Lymph nodes: No enlarged lymph nodes in the neck. Vascular: Atherosclerotic disease carotid bifurcation bilaterally. Moderate stenosis proximal left internal carotid artery. Jugular vein patent bilaterally. Limited intracranial: Negative Visualized orbits: Not imaged Mastoids and visualized paranasal sinuses: Negative Skeleton: Cervical spondylosis multiple levels. No acute skeletal abnormality. Upper chest: Apical emphysema. Left upper lobe density compatible with scarring. This is similar to the CT chest of 12/14/2019 due to prior cancer treatment. Other: None IMPRESSION: No cause for neck pain identified.  No mass or adenopathy. Post treatment scarring left upper lobe is stable. Atherosclerotic disease in the carotid artery bilaterally. Moderate stenosis proximal left internal carotid artery due to calcific stenosis. Electronically Signed   By: Franchot Gallo M.D.   On: 04/23/2020 12:08    Procedures Procedures (including critical care time)  Medications Ordered in ED Medications  HYDROcodone-acetaminophen (NORCO/VICODIN) 5-325 MG per tablet 1 tablet (1 tablet Oral Given 04/23/20 0810)    cyclobenzaprine (FLEXERIL) tablet 5 mg (5 mg Oral Given 04/23/20 0810)  potassium chloride SA (KLOR-CON) CR  tablet 40 mEq (40 mEq Oral Given 04/23/20 1039)  iohexol (OMNIPAQUE) 300 MG/ML solution 75 mL (75 mLs Intravenous Contrast Given 04/23/20 1119)    ED Course  I have reviewed the triage vital signs and the nursing notes.  Pertinent labs & imaging results that were available during my care of the patient were reviewed by me and considered in my medical decision making (see chart for details).    MDM Rules/Calculators/A&P                      Pt neuro intact, tenderness along lateral L neck muscles into trapezius. Labs notable for K 3.0, gave repletion, and INR 3.2. Because of sudden onset of pain and h/o falls, obtained CT c-spine. Added soft tissue neck due to report of swallowing problems however pt seems to be eating normally with no choking episodes last night at dinner.  CT neck negative for acute bony or soft tissue process.  Patient well-appearing and comfortable on reassessment.  Daughter states that he later admitted that he has been having swallowing problems for months, not just 1 day.  We will have the patient follow-up with PCP for work-up of this.  His exam and story are consistent with torticollis and I have discussed supportive measures including pain control, heat therapy, range of motion exercises, and massage to improve his symptoms.  Follow-up with PCP in a few days for reassessment if not improved.  Return precautions reviewed with daughter. Final Clinical Impression(s) / ED Diagnoses Final diagnoses:  Torticollis    Rx / DC Orders ED Discharge Orders         Ordered    HYDROcodone-acetaminophen (NORCO/VICODIN) 5-325 MG tablet  Every 4 hours PRN     04/23/20 1354           Cameran Ahmed, Wenda Overland, MD 04/23/20 1404

## 2020-05-01 DIAGNOSIS — R252 Cramp and spasm: Secondary | ICD-10-CM | POA: Insufficient documentation

## 2020-05-02 ENCOUNTER — Ambulatory Visit (INDEPENDENT_AMBULATORY_CARE_PROVIDER_SITE_OTHER): Payer: Medicare Other | Admitting: Otolaryngology

## 2020-06-15 ENCOUNTER — Telehealth: Payer: Self-pay | Admitting: *Deleted

## 2020-06-15 NOTE — Telephone Encounter (Signed)
Called patient to ask about doing a CT, lvm for a return call

## 2020-06-15 NOTE — Telephone Encounter (Signed)
CALLED PATIENT TO ASK ABOUT WHEN HE WOULD BE WILLING TO DO A  CT, LVM FOR A RETURN CALL

## 2020-06-18 ENCOUNTER — Telehealth: Payer: Self-pay | Admitting: *Deleted

## 2020-06-18 ENCOUNTER — Ambulatory Visit: Payer: Medicare Other | Admitting: Radiation Oncology

## 2020-06-18 NOTE — Telephone Encounter (Signed)
CALLED PATIENT TO INFORM THAT HE DOES NOT NEED TO COME TODAY, DUE TO NOT HAVING CT, LVM FOR A RETURN CALL

## 2020-06-18 NOTE — Telephone Encounter (Signed)
CALLED PATIENT'S DAUGHTER [name] JONES TO INFORM OF CT FOR - 06-22-20 - ARRIVAL TIME - 2:15 PM FOR STAT LABS AND ARRIVAL TIME- 3:15 PM @ [facility] FOR CT, PATIENT TO HAVE WATER ONLY 4- HRS. PRIOR TO TEST, PATIENT TO RECEIVE RESULTS FROM ALISON PERKINS ON 06-25-20 @ 2 PM VIA TELEPHONE, PATIENT'S DAUGHTER [name] JONES VERIFIED UNDERSTANDING ALL THESE APPTS.

## 2020-06-22 ENCOUNTER — Other Ambulatory Visit: Payer: Self-pay

## 2020-06-22 ENCOUNTER — Ambulatory Visit
Admission: RE | Admit: 2020-06-22 | Discharge: 2020-06-22 | Disposition: A | Discharge status: Home / Self Care | Payer: Medicare Other | Source: Ambulatory Visit | Attending: Radiation Oncology | Admitting: Radiation Oncology

## 2020-06-22 ENCOUNTER — Ambulatory Visit (HOSPITAL_COMMUNITY)
Admission: RE | Admit: 2020-06-22 | Discharge: 2020-06-22 | Disposition: A | Discharge status: Home / Self Care | Payer: Medicare Other | Source: Ambulatory Visit | Attending: Radiation Oncology | Admitting: Radiation Oncology

## 2020-06-22 DIAGNOSIS — C7802 Secondary malignant neoplasm of left lung: Secondary | ICD-10-CM | POA: Diagnosis not present

## 2020-06-22 DIAGNOSIS — C3412 Malignant neoplasm of upper lobe, left bronchus or lung: Secondary | ICD-10-CM | POA: Insufficient documentation

## 2020-06-22 DIAGNOSIS — C801 Malignant (primary) neoplasm, unspecified: Secondary | ICD-10-CM | POA: Diagnosis present

## 2020-06-22 LAB — BUN & CREATININE (CHCC)
BUN: 20 mg/dL (ref 8–23)
Creatinine: 1.3 mg/dL — ABNORMAL HIGH (ref 0.61–1.24)
GFR, Est AFR Am: 58 mL/min — ABNORMAL LOW (ref >60)
GFR, Estimated: 50 mL/min — ABNORMAL LOW (ref >60)

## 2020-06-22 MED ORDER — SODIUM CHLORIDE (PF) 0.9 % IJ SOLN
INTRAMUSCULAR | Status: AC
  Filled 2020-06-22: qty 50

## 2020-06-22 MED ORDER — IOHEXOL 300 MG/ML  SOLN
75.0000 mL | Freq: Once | INTRAMUSCULAR | Status: AC | PRN
  Administered 2020-06-22: 75 mL via INTRAVENOUS

## 2020-06-25 ENCOUNTER — Ambulatory Visit
Admission: RE | Admit: 2020-06-25 | Discharge: 2020-06-25 | Disposition: A | Discharge status: Home / Self Care | Payer: Medicare Other | Source: Ambulatory Visit | Attending: Radiation Oncology | Admitting: Radiation Oncology

## 2020-06-25 ENCOUNTER — Encounter: Payer: Self-pay | Admitting: Radiation Oncology

## 2020-06-25 ENCOUNTER — Other Ambulatory Visit: Payer: Self-pay

## 2020-06-25 ENCOUNTER — Telehealth: Payer: Self-pay

## 2020-06-25 ENCOUNTER — Telehealth: Payer: Self-pay | Admitting: Radiation Oncology

## 2020-06-25 DIAGNOSIS — C7802 Secondary malignant neoplasm of left lung: Secondary | ICD-10-CM

## 2020-06-25 DIAGNOSIS — C3412 Malignant neoplasm of upper lobe, left bronchus or lung: Secondary | ICD-10-CM

## 2020-06-25 NOTE — Telephone Encounter (Signed)
I was able to message with Dr. Ardis Hughs, and he recommended abdominal MRI of the pancreas with [facility] imaging. I called and let the patient and his wife know and they are in agreement. We will schedule next available and follow up when available.

## 2020-06-25 NOTE — Progress Notes (Signed)
Radiation Oncology         [phone] ________________________________  Outpatient Follow Up - Conducted via telephone due to current COVID-19 concerns for limiting patient exposure  I spoke with the patient to conduct this consult visit via telephone to spare the patient unnecessary potential exposure in the healthcare setting during the current COVID-19 pandemic. The patient was notified in advance and was offered a [place] meeting to allow for face to face communication but unfortunately reported that they did not have the appropriate resources/technology to support such a visit and instead preferred to proceed with a telephone visit. ________________________________  Name: Robert [facility] MRN: [id number]  Date of Service: 06/25/2020  DOB: 1934/10/21  Post Treatment Follow Up  Diagnosis:  Stage IA, NSCLC,  squamous cell carcinoma of the left upper lobe  Interval Since Last Radiation: 1 year, 2 months  04/11/2019-04/18/2019 SBRT Treatement: The LUL was treated to 54 Gy in 3 fractions  Narrative:  In summary, this is a pleasant  86 y.o. gentleman with a  Stage IA, NSCLC, squamous cell carcinoma of the left upper lobe. Robert [facility] was noticing cough and weakness and on 12/06/2018 he underwent a CXR that showed mild patchy LUL opacity. He was treated with antibiotics and had a CT on 01/28/2019 that revealed a 15 x 18 x 22 mm solid spiculated mass in the LUL. He did have an 11 mm AP window node and a left hilar node measuring 9 mm. PET scan on 02/14/2019 revealed an SUV of 12.6 in the LUL nodule and no evidence of hypermetabolic mediastinal or hilar adenopathy. He had low level uptake in the left axilla but this was felt to be reactive. He did undergo bronchoscopy on 03/09/2019 which revealed a squamous cell carcinoma. He was not a good surgical candidate for resection so he proceeded with 3 fractions of SBRT which were completed in May 2020.  Since completing treatment, he has had stable CT scans.  A  CT on 06/22/2020 noted the treated left upper lobe nodule measuring 7 mm, previously this was 8 mm back in January of this year.  Postoperative and postradiation architectural distortion in the left upper lobe and superior segment of the left lower lobe was unchanged, he has scattered peripheral pulmonary nodules measuring up to 4 mm in the left lower lobe that were also stable.  No new or worrisome nodules were identified.  No evidence of adenopathy was noted.  He continues to have gallstones, and a lesion in the uncinate process of the pancreas measuring 1.5 cm that was felt to be stable from a CT scan that was performed in 2020, and this has been recommended to be followed in 2 years time with an abdominal CT with and without contrast.  He continues to have aortic atherosclerosis and coronary calcifications, and emphysematous changes of the lungs.  On review of systems, the patient reports that he is doing well overall. He denies any chest pain, shortness of breath, cough, fevers, chills, night sweats, unintended weight changes. He denies any bowel or bladder disturbances but has to strain sometime with bowel movements. He has lost weight intentionally with increasing his activity. He denies abdominal pain, nausea or vomiting. He denies any new musculoskeletal or joint aches or pains, new skin lesions or concerns. A complete review of systems is obtained and is otherwise negative.  PAST MEDICAL HISTORY:  Past Medical History:  Diagnosis Date  . Borderline diabetes    takes Metformin  . Cancer ([place])  lung 2020 per spouse LUL  . COPD (chronic obstructive pulmonary disease) ([place])   . DVT (deep venous thrombosis) ([place])   . Full dentures   . Hearing aid worn    B/L  . HOH (hard of hearing)   . Hypertension   . Left upper lobe pulmonary nodule   . Memory problem   . Pneumonia   . Schizo affective schizophrenia ([place])     PAST SURGICAL HISTORY: Past Surgical History:  Procedure Laterality Date   . CATARACT EXTRACTION    . COLONOSCOPY    . FUDUCIAL PLACEMENT N/A 03/09/2019   Procedure: PLACEMENT OF FUDUCIAL;  Surgeon: [name], MD;  Location: [facility];  Service: Thoracic;  Laterality: N/A;  . THROMBECTOMY    . VIDEO BRONCHOSCOPY WITH ENDOBRONCHIAL NAVIGATION N/A 03/09/2019   Procedure: VIDEO BRONCHOSCOPY WITH ENDOBRONCHIAL NAVIGATION;  Surgeon: [name], MD;  Location: [place];  Service: Thoracic;  Laterality: N/A;    PAST SOCIAL HISTORY:  Social History   Socioeconomic History  . Marital status: Married    Spouse name: Not on file  . Number of children: Not on file  . Years of education: Not on file  . Highest education level: Not on file  Occupational History  . Not on file  Tobacco Use  . Smoking status: Former Smoker    Packs/day: 1.00    Years: 50.00    Pack years: 50.00    Types: Cigarettes, Pipe    Start date: 55    Quit date: 03/03/2015    Years since quitting: 5.3  . Smokeless tobacco: Never Used  . Tobacco comment: 30 yearsv cigarettes ; pipe 20 years  Vaping Use  . Vaping Use: Never used  Substance and Sexual Activity  . Alcohol use: No  . Drug use: No  . Sexual activity: Not on file  Other Topics Concern  . Not on file  Social History Narrative  . Not on file   Social Determinants of Health   Financial Resource Strain:   . Difficulty of Paying Living Expenses:   Food Insecurity:   . Worried About Charity fundraiser in the Last Year:   . Arboriculturist in the Last Year:   Transportation Needs:   . Film/video editor (Medical):   Marland Kitchen Lack of Transportation (Non-Medical):   Physical Activity:   . Days of Exercise per Week:   . Minutes of Exercise per Session:   Stress:   . Feeling of Stress :   Social Connections:   . Frequency of Communication with Friends and Family:   . Frequency of Social Gatherings with Friends and Family:   . Attends Religious Services:   . Active Member of Clubs or Organizations:   . Attends  Archivist Meetings:   Marland Kitchen Marital Status:   Intimate Partner Violence:   . Fear of Current or Ex-Partner:   . Emotionally Abused:   Marland Kitchen Physically Abused:   . Sexually Abused:   The patient is married and lives in [place]. He's accompanied on the call by his wife and daughter [name].  PAST FAMILY HISTORY:No family history on file.  MEDICATIONS  Current Outpatient Medications  Medication Sig Dispense Refill  . acetaminophen (TYLENOL) 500 MG tablet Take 500-1,000 mg by mouth every 6 (six) hours as needed (FOR PAIN.).    Marland Kitchen aspirin EC 81 MG tablet Take 81 mg by mouth every evening.     Marland Kitchen atorvastatin (LIPITOR) 40 MG tablet Take  40 mg by mouth every evening.   2  . buPROPion (WELLBUTRIN) 100 MG tablet Take 100 mg by mouth 2 (two) times daily.    . Capsaicin-Menthol (SALONPAS GEL-PATCH HOT EX) Apply 1 application topically daily as needed (For back pain).    . clonazePAM (KLONOPIN) 0.5 MG tablet Take 0.25 mg by mouth 2 (two) times daily.     . fenofibrate 160 MG tablet Take 160 mg by mouth daily.    . furosemide (LASIX) 20 MG tablet Take 20 mg by mouth See admin instructions. Take 1 tablet by mouth on Monday Wednesday and Friday    . hydrochlorothiazide (HYDRODIURIL) 12.5 MG tablet Take 12.5 mg by mouth daily.    Marland Kitchen HYDROcodone-acetaminophen (NORCO/VICODIN) 5-325 MG tablet Take 1 tablet by mouth every 4 (four) hours as needed. 8 tablet 0  . hydrocortisone cream 1 % Apply 1 application topically daily as needed for itching (For rash).    Marland Kitchen linagliptin (TRADJENTA) 5 MG TABS tablet Take 5 mg by mouth every other day.    . metFORMIN (GLUCOPHAGE) 500 MG tablet Take 500 mg by mouth in the morning, at noon, and at bedtime.     . metoprolol tartrate (LOPRESSOR) 25 MG tablet Take 25 mg by mouth daily.     . Multiple Vitamin (MULTIVITAMIN WITH MINERALS) TABS tablet Take 1 tablet by mouth daily.    Marland Kitchen OLANZapine (ZYPREXA) 10 MG tablet Take 10 mg by mouth at bedtime.    . Omega-3 Fatty Acids (FISH  OIL PO) Take 1 capsule by mouth daily.    Vladimir Faster Glycol-Propyl Glycol (SYSTANE FREE OP) Place 1 drop into both eyes in the morning and at bedtime.    . potassium chloride SA (KLOR-CON) 20 MEQ tablet Take 10 mEq by mouth daily. Take one tablet (10mg ) by mouth Daily    . Probiotic Product (PROBIOTIC PO) Take 1 tablet by mouth every evening.    . warfarin (COUMADIN) 5 MG tablet Take 2.5-5 mg by mouth See admin instructions. Take 5mg  tablet by mouth on Mondays, Wednesdays, Fridays, and take 2.5mg  tablet by mouth  on Tuesday, Thursday, Saturday and Sunday    . benzonatate (TESSALON) 100 MG capsule Take 1 capsule (100 mg total) by mouth every 8 (eight) hours. (Patient not taking: Reported on 04/23/2020) 21 capsule 0   No current facility-administered medications for this encounter.    ALLERGIES:  Allergies  Allergen Reactions  . Haloperidol Lactate Other (See Comments)    "made him climb the walls."  . Sildenafil Other (See Comments)    "pain in right thigh."   Physical Exam: Unable to assess due to encounter type.   Impression/Plan: 1. Stage IA, NSCLC,  squamous cell carcinoma of the left upper lobe. The patient reports he's doing well and we reviewed the favorable findings on his recent CT imaging and the rationale to proceed with repeat imaging in 6 months time. We also discussed that if his health were to deteriorate given his age, and if getting out to appointments was not feasible, we could change our goals for his care. At this time however, he reports he feels well enough that if another area were to be found he would want to proceed with more treatment. We will continue to keep this in mind as we follow him according to guidelines.  2. Lesion in the uncinate process of the pancreas.  Per radiology, it is recommended that he undergo a repeat CT scan in 2 years with contrast as this  has remained stable on prior imaging.  It is curious however though because the prior scans that I have  ordered as well as prior PET do not mention this lesion, but his CT scan now is comparing his CT chest with contrast and possibly another study that is not within our system in June 2020.  I will also reach out to GI to see if they would recommend any further work-up.  Given current concerns for patient exposure during the COVID-19 pandemic, this encounter was conducted via telephone.  The patient has given verbal consent for this type of encounter. The time spent during this encounter was 45 minutes and 50% of that time was spent in the coordination of his care. The attendants for this meeting include Shona Simpson, [facility] and MIRZA FESSEL and his wife Caryl Asp and daughter Dixon Boos.  During the encounter, Shona Simpson [facility] was located at [facility] Radiation Oncology Department.  Jodene Nam and his wife Caryl Asp and daughter Dixon Boos were located at home.    Carola Rhine, PAC

## 2020-06-25 NOTE — Telephone Encounter (Signed)
Spoke with patient's wife and daughter in regards to telephone appointment today at 2:00pm. Meaningful use questions were reviewed. Patient wife and daughter verbalized understanding of appointment date and time. TM

## 2020-07-10 ENCOUNTER — Other Ambulatory Visit: Payer: Self-pay | Admitting: Radiation Oncology

## 2020-07-10 DIAGNOSIS — K862 Cyst of pancreas: Secondary | ICD-10-CM

## 2020-07-10 NOTE — Progress Notes (Signed)
The patient's daughter called back. She was not aware of the need for MRI so new orders were placed to follow up on a pancreatic abnormality seen on recent CT scan. We will follow up with this and review with GI if needed. I also will plan to keep him on a 6 month cycle for surveillance CT scans of the chest.

## 2020-07-12 ENCOUNTER — Telehealth: Payer: Self-pay | Admitting: *Deleted

## 2020-07-12 NOTE — Telephone Encounter (Signed)
CALLED PATIENT TO INFORM OF MRI FOR 07-18-20- ARRIVAL TIME- 9:45 AM @ [facility], PATIENT TO BE NPO- 4 HRS. PRIOR TO TEST, LVM FOR A RETURN CALL

## 2020-07-16 ENCOUNTER — Telehealth: Payer: Self-pay | Admitting: *Deleted

## 2020-07-16 NOTE — Telephone Encounter (Signed)
Returned patient's phone call, lvm for a return call 

## 2020-07-18 ENCOUNTER — Other Ambulatory Visit: Payer: Self-pay

## 2020-07-18 ENCOUNTER — Other Ambulatory Visit: Payer: Self-pay | Admitting: Radiation Oncology

## 2020-07-18 ENCOUNTER — Ambulatory Visit (HOSPITAL_COMMUNITY)
Admission: RE | Admit: 2020-07-18 | Discharge: 2020-07-18 | Disposition: A | Discharge status: Home / Self Care | Payer: Medicare Other | Source: Ambulatory Visit | Attending: Radiation Oncology | Admitting: Radiation Oncology

## 2020-07-18 DIAGNOSIS — K862 Cyst of pancreas: Secondary | ICD-10-CM

## 2020-07-18 MED ORDER — GADOBUTROL 1 MMOL/ML IV SOLN
9.0000 mL | Freq: Once | INTRAVENOUS | Status: AC | PRN
  Administered 2020-07-18: 9 mL via INTRAVENOUS

## 2020-07-23 ENCOUNTER — Telehealth: Payer: Self-pay | Admitting: Radiation Oncology

## 2020-07-23 NOTE — Telephone Encounter (Signed)
I called the patient and he couldn't hear me on the phone and hung up. I called back and was able to conference call the patient's wife and daughter Dixon Boos on the call to review his recent MRI of the abdomen. Fortunately the description from radiology is a more favorable finding, and one that can be followed in surveillance. We will plan to continue to follow him in surveillance for his lung cancer with CT, and in 2023, plan to repeat his abdominal imaging. They are aware to seek urgent evaluation if he developed acute onset of jaundice or abdominal pain and are in agreement with this plan.    Carola Rhine, PAC

## 2020-08-13 ENCOUNTER — Ambulatory Visit: Payer: Medicare Other | Admitting: Podiatry

## 2020-08-13 ENCOUNTER — Other Ambulatory Visit: Payer: Self-pay

## 2020-08-13 DIAGNOSIS — M79675 Pain in left toe(s): Secondary | ICD-10-CM | POA: Diagnosis not present

## 2020-08-13 DIAGNOSIS — B351 Tinea unguium: Secondary | ICD-10-CM | POA: Diagnosis not present

## 2020-08-13 DIAGNOSIS — L989 Disorder of the skin and subcutaneous tissue, unspecified: Secondary | ICD-10-CM

## 2020-08-13 DIAGNOSIS — M79674 Pain in right toe(s): Secondary | ICD-10-CM | POA: Diagnosis not present

## 2020-08-13 NOTE — Progress Notes (Signed)
   SUBJECTIVE Patient presents to office today complaining of elongated, thickened nails that cause pain while ambulating in shoes. He is unable to trim his own nails. Patient is here for further evaluation and treatment.  Past Medical History:  Diagnosis Date  . Borderline diabetes    takes Metformin  . Cancer ([place])    lung 2020 per spouse LUL  . COPD (chronic obstructive pulmonary disease) ([place])   . DVT (deep venous thrombosis) ([place])   . Full dentures   . Hearing aid worn    B/L  . HOH (hard of hearing)   . Hypertension   . Left upper lobe pulmonary nodule   . Memory problem   . Pneumonia   . Schizo affective schizophrenia ([place])     OBJECTIVE General Patient is awake, alert, and oriented x 3 and in no acute distress. Derm Skin is dry and supple bilateral. Negative open lesions or macerations. Remaining integument unremarkable. Nails are tender, long, thickened and dystrophic with subungual debris, consistent with onychomycosis, 1-5 bilateral. No signs of infection noted. Hyperkeratotic preulcerative callus lesions also noted to the bilateral feet x2 Vasc  DP and PT pedal pulses palpable bilaterally. Temperature gradient within normal limits.  Neuro Epicritic and protective threshold sensation grossly intact bilaterally.  Musculoskeletal Exam No symptomatic pedal deformities noted bilateral. Muscular strength within normal limits.  ASSESSMENT 1. Pain due to onychomycosis of toenail both Two. Preulcerative callus lesions bilateral feet x10 3. Pain in foot bilateral  PLAN OF CARE 1. Patient evaluated today.  2. Instructed to maintain good pedal hygiene and foot care.  3. Mechanical debridement of nails 1-5 bilaterally performed using a nail nipper. Filed with dremel without incident.  4. Excisional debridement of the hyperkeratotic callus tissue was performed using a tissue nipper without incident or bleeding Five. Return to clinic in 3 mos.    Edrick Kins, DPM [facility]  Dr. Edrick Kins, [address]                                        [place], [place] [address]                Office [phone]  Fax [phone]

## 2020-12-05 ENCOUNTER — Other Ambulatory Visit: Payer: Self-pay

## 2020-12-05 ENCOUNTER — Ambulatory Visit: Payer: Medicare Other | Admitting: Podiatry

## 2020-12-05 DIAGNOSIS — B351 Tinea unguium: Secondary | ICD-10-CM | POA: Diagnosis not present

## 2020-12-05 DIAGNOSIS — L989 Disorder of the skin and subcutaneous tissue, unspecified: Secondary | ICD-10-CM | POA: Diagnosis not present

## 2020-12-05 DIAGNOSIS — L84 Corns and callosities: Secondary | ICD-10-CM | POA: Diagnosis not present

## 2020-12-05 DIAGNOSIS — M79674 Pain in right toe(s): Secondary | ICD-10-CM | POA: Diagnosis not present

## 2020-12-05 DIAGNOSIS — E1122 Type 2 diabetes mellitus with diabetic chronic kidney disease: Secondary | ICD-10-CM | POA: Diagnosis not present

## 2020-12-05 DIAGNOSIS — M79675 Pain in left toe(s): Secondary | ICD-10-CM | POA: Diagnosis not present

## 2020-12-05 NOTE — Progress Notes (Signed)
   SUBJECTIVE Patient presents to office today complaining of elongated, thickened nails that cause pain while ambulating in shoes. He is unable to trim his own nails. Patient is here for further evaluation and treatment.  Past Medical History:  Diagnosis Date  . Borderline diabetes    takes Metformin  . Cancer ([place])    lung 2020 per spouse LUL  . COPD (chronic obstructive pulmonary disease) ([place])   . DVT (deep venous thrombosis) ([place])   . Full dentures   . Hearing aid worn    B/L  . HOH (hard of hearing)   . Hypertension   . Left upper lobe pulmonary nodule   . Memory problem   . Pneumonia   . Schizo affective schizophrenia ([place])     OBJECTIVE General Patient is awake, alert, and oriented x 3 and in no acute distress. Derm Skin is dry and supple bilateral. Negative open lesions or macerations. Remaining integument unremarkable. Nails are tender, long, thickened and dystrophic with subungual debris, consistent with onychomycosis, 1-5 bilateral. No signs of infection noted. Hyperkeratotic preulcerative callus lesions also noted to the bilateral feet x2 Vasc  DP and PT pedal pulses palpable bilaterally. Temperature gradient within normal limits.  Neuro Epicritic and protective threshold sensation grossly intact bilaterally.  Musculoskeletal Exam No symptomatic pedal deformities noted bilateral. Muscular strength within normal limits.  ASSESSMENT 1. Pain due to onychomycosis of toenail both Two. Preulcerative callus lesions bilateral feet x10 3. Pain in foot bilateral  PLAN OF CARE 1. Patient evaluated today.  2. Instructed to maintain good pedal hygiene and foot care.  3. Mechanical debridement of nails 1-5 bilaterally performed using a nail nipper. Filed with dremel without incident.  4. Excisional debridement of the hyperkeratotic callus tissue was performed using a tissue nipper without incident or bleeding Five. Return to clinic in 3 mos.    Edrick Kins, DPM [facility]  Dr. Edrick Kins, [address]                                        [place],  [address]                Office [phone]  Fax [phone]

## 2021-01-07 ENCOUNTER — Telehealth: Payer: Self-pay

## 2021-01-07 NOTE — Telephone Encounter (Signed)
Left patient voicemail message to call back in regards to telephone visit with Shona Simpson PA on 01/14/21 @ 1:30, Called to review meaningful use questions. TM

## 2021-01-08 ENCOUNTER — Ambulatory Visit: Payer: Medicare Other | Admitting: Podiatry

## 2021-01-10 ENCOUNTER — Ambulatory Visit (HOSPITAL_COMMUNITY)
Admission: RE | Admit: 2021-01-10 | Discharge: 2021-01-10 | Disposition: A | Discharge status: Home / Self Care | Payer: Medicare Other | Source: Ambulatory Visit | Attending: Radiation Oncology | Admitting: Radiation Oncology

## 2021-01-10 ENCOUNTER — Inpatient Hospital Stay: Payer: Medicare Other | Attending: Radiation Oncology

## 2021-01-10 ENCOUNTER — Other Ambulatory Visit: Payer: Self-pay

## 2021-01-10 DIAGNOSIS — K862 Cyst of pancreas: Secondary | ICD-10-CM | POA: Insufficient documentation

## 2021-01-10 LAB — BUN & CREATININE (CHCC)
BUN: 18 mg/dL (ref 8–23)
Creatinine: 1.46 mg/dL — ABNORMAL HIGH (ref 0.61–1.24)
GFR, Estimated: 47 mL/min — ABNORMAL LOW (ref >60)

## 2021-01-10 MED ORDER — IOHEXOL 300 MG/ML  SOLN
75.0000 mL | Freq: Once | INTRAMUSCULAR | Status: AC | PRN
  Administered 2021-01-10: 75 mL via INTRAVENOUS

## 2021-01-14 ENCOUNTER — Telehealth: Payer: Self-pay

## 2021-01-14 ENCOUNTER — Ambulatory Visit
Admission: RE | Admit: 2021-01-14 | Discharge: 2021-01-14 | Disposition: A | Discharge status: Home / Self Care | Payer: Medicare Other | Source: Ambulatory Visit | Attending: Radiation Oncology | Admitting: Radiation Oncology

## 2021-01-14 ENCOUNTER — Other Ambulatory Visit: Payer: Self-pay

## 2021-01-14 DIAGNOSIS — C3412 Malignant neoplasm of upper lobe, left bronchus or lung: Secondary | ICD-10-CM

## 2021-01-14 NOTE — Telephone Encounter (Signed)
Left voicemail message to daughter [name] advising that this was a telephone visit. TM

## 2021-01-14 NOTE — Progress Notes (Signed)
Radiation Oncology         [phone] ________________________________  Outpatient Follow Up   Name: Robert Fisher MRN: [id number]  Date of Service: 01/14/2021  DOB: 1934/06/14  Post Treatment Follow Up  Diagnosis:  Stage IA, NSCLC,  squamous cell carcinoma of the left upper lobe  Interval Since Last Radiation: 21 months  04/11/2019-04/18/2019 SBRT Treatement: The LUL was treated to 54 Gy in 3 fractions  Narrative:  In summary, this is a pleasant  87 y.o. gentleman with a  Stage IA, NSCLC, squamous cell carcinoma of the left upper lobe. Mr. Zahner was noticing cough and weakness and on 12/06/2018 he underwent a CXR that showed mild patchy LUL opacity. He was treated with antibiotics and had a CT on 01/28/2019 that revealed a 15 x 18 x 22 mm solid spiculated mass in the LUL. He did have an 11 mm AP window node and a left hilar node measuring 9 mm. PET scan on 02/14/2019 revealed an SUV of 12.6 in the LUL nodule and no evidence of hypermetabolic mediastinal or hilar adenopathy. He had low level uptake in the left axilla but this was felt to be reactive. He did undergo bronchoscopy on 03/09/2019 which revealed a squamous cell carcinoma. He was not a good surgical candidate for resection so he proceeded with 3 fractions of SBRT which were completed in May 2020.  Since completing treatment, he has had stable CT scans.   He continues to have gallstones, and a lesion in the uncinate process of the pancreas measuring 1.5 cm that was felt to be stable from a CT scan that was performed in 2020, and this has been recommended to be followed in 2 years time with an abdominal CT with and without contrast at the time of a scan in the summer of 2021.  He had a repeat CT chest on 01/10/2021 that showed largely stable appearance of the postradiation treatment changes in the left chest with slight interval decrease in the centrally located solid component.  No new or concerning findings were otherwise seen and a  stable 4 mm subpleural nodule in the left lung base with other scattered tiny scattered nodules were again seen in the pleural surface, he continues to have aortic atherosclerosis and coronary calcifications, and emphysematous changes of the lungs.  On review of systems, the patient reports that he is doing well overall.   PAST MEDICAL HISTORY:  Past Medical History:  Diagnosis Date  . Borderline diabetes    takes Metformin  . Cancer ([place])    lung 2020 per spouse LUL  . COPD (chronic obstructive pulmonary disease) ([employer])   . DVT (deep venous thrombosis) ([place])   . Full dentures   . Hearing aid worn    B/L  . HOH (hard of hearing)   . Hypertension   . Left upper lobe pulmonary nodule   . Memory problem   . Pneumonia   . Schizo affective schizophrenia ([place])     PAST SURGICAL HISTORY: Past Surgical History:  Procedure Laterality Date  . CATARACT EXTRACTION    . COLONOSCOPY    . FUDUCIAL PLACEMENT N/A 03/09/2019   Procedure: PLACEMENT OF FUDUCIAL;  Surgeon: [name], MD;  Location: [facility];  Service: Thoracic;  Laterality: N/A;  . THROMBECTOMY    . VIDEO BRONCHOSCOPY WITH ENDOBRONCHIAL NAVIGATION N/A 03/09/2019   Procedure: VIDEO BRONCHOSCOPY WITH ENDOBRONCHIAL NAVIGATION;  Surgeon: [name], MD;  Location: [facility];  Service: Thoracic;  Laterality: N/A;  PAST SOCIAL HISTORY:  Social History   Socioeconomic History  . Marital status: Married    Spouse name: Not on file  . Number of children: Not on file  . Years of education: Not on file  . Highest education level: Not on file  Occupational History  . Not on file  Tobacco Use  . Smoking status: Former Smoker    Packs/day: 1.00    Years: 50.00    Pack years: 50.00    Types: Cigarettes, Pipe    Start date: 51    Quit date: 03/03/2015    Years since quitting: 5.8  . Smokeless tobacco: Never Used  . Tobacco comment: 30 yearsv cigarettes ; pipe 20 years  Vaping Use  . Vaping Use: Never used   Substance and Sexual Activity  . Alcohol use: No  . Drug use: No  . Sexual activity: Not on file  Other Topics Concern  . Not on file  Social History Narrative  . Not on file   Social Determinants of Health   Financial Resource Strain: Not on file  Food Insecurity: Not on file  Transportation Needs: Not on file  Physical Activity: Not on file  Stress: Not on file  Social Connections: Not on file  Intimate Partner Violence: Not on file  The patient is married and lives in [place].    PAST FAMILY HISTORY:No family history on file.  MEDICATIONS  Current Outpatient Medications  Medication Sig Dispense Refill  . acetaminophen (TYLENOL) 500 MG tablet Take 500-1,000 mg by mouth every 6 (six) hours as needed (FOR PAIN.).    Marland Kitchen aspirin EC 81 MG tablet Take 81 mg by mouth every evening.     Marland Kitchen atorvastatin (LIPITOR) 40 MG tablet Take 40 mg by mouth every evening.   2  . benzonatate (TESSALON) 100 MG capsule Take 1 capsule (100 mg total) by mouth every 8 (eight) hours. (Patient not taking: Reported on 04/23/2020) 21 capsule 0  . buPROPion (WELLBUTRIN) 100 MG tablet Take 100 mg by mouth 2 (two) times daily.    . Capsaicin-Menthol (SALONPAS GEL-PATCH HOT EX) Apply 1 application topically daily as needed (For back pain).    . clonazePAM (KLONOPIN) 0.5 MG tablet Take 0.25 mg by mouth 2 (two) times daily.     . fenofibrate 160 MG tablet Take 160 mg by mouth daily.    . furosemide (LASIX) 20 MG tablet Take 20 mg by mouth See admin instructions. Take 1 tablet by mouth on Monday Wednesday and Friday    . hydrochlorothiazide (HYDRODIURIL) 12.5 MG tablet Take 12.5 mg by mouth daily.    Marland Kitchen HYDROcodone-acetaminophen (NORCO/VICODIN) 5-325 MG tablet Take 1 tablet by mouth every 4 (four) hours as needed. 8 tablet 0  . hydrocortisone cream 1 % Apply 1 application topically daily as needed for itching (For rash).    Marland Kitchen linagliptin (TRADJENTA) 5 MG TABS tablet Take 5 mg by mouth every other day.    .  metFORMIN (GLUCOPHAGE) 500 MG tablet Take 500 mg by mouth in the morning, at noon, and at bedtime.     . metoprolol tartrate (LOPRESSOR) 25 MG tablet Take 25 mg by mouth daily.     . Multiple Vitamin (MULTIVITAMIN WITH MINERALS) TABS tablet Take 1 tablet by mouth daily.    Marland Kitchen OLANZapine (ZYPREXA) 10 MG tablet Take 10 mg by mouth at bedtime.    . Omega-3 Fatty Acids (FISH OIL PO) Take 1 capsule by mouth daily.    Vladimir Faster Glycol-Propyl  Glycol (SYSTANE FREE OP) Place 1 drop into both eyes in the morning and at bedtime.    . potassium chloride SA (KLOR-CON) 20 MEQ tablet Take 10 mEq by mouth daily. Take one tablet (10mg ) by mouth Daily    . Probiotic Product (PROBIOTIC PO) Take 1 tablet by mouth every evening.    . warfarin (COUMADIN) 5 MG tablet Take 2.5-5 mg by mouth See admin instructions. Take 5mg  tablet by mouth on Mondays, Wednesdays, Fridays, and take 2.5mg  tablet by mouth  on Tuesday, Thursday, Saturday and Sunday     No current facility-administered medications for this encounter.    ALLERGIES:  Allergies  Allergen Reactions  . Haloperidol Lactate Other (See Comments)    "made him climb the walls."  . Sildenafil Other (See Comments)    "pain in right thigh."   Physical Exam: Unable to assess due to encounter type.   Impression/Plan: 1. Stage IA, NSCLC,  squamous cell carcinoma of the left upper lobe. The patient continues to do well clinically per his wife and we will plan to repeat another CT scan in about 6 month's time. They are in agreement. The patient ended up coming in person for this visit, and I was able to also explain the findings to his wife by phone. Given his good performance status he and his daughter agreement to continue with close surveillance. 2. Lesion in the uncinate process of the pancreas.  Per radiology, it is recommended that he undergo a repeat CT scan dedicated of the abdomen. This was recommended 2 years from August 2021, so he will be due for this in  2023 with contrast as this has remained stable on prior imaging.    In a visit lasting 45 minutes, greater than 50% of the time was spent face to face and by phone discussing the patient's condition, in preparation for the discussion, and coordinating the patient's care.     Carola Rhine, PAC

## 2021-03-06 ENCOUNTER — Ambulatory Visit: Payer: Medicare Other | Admitting: Podiatry

## 2021-03-08 ENCOUNTER — Ambulatory Visit: Payer: Medicare Other | Admitting: Podiatry

## 2021-03-19 ENCOUNTER — Ambulatory Visit: Payer: Medicare Other | Admitting: Podiatry

## 2021-04-01 ENCOUNTER — Encounter: Payer: Self-pay | Admitting: Podiatry

## 2021-04-01 ENCOUNTER — Other Ambulatory Visit: Payer: Self-pay

## 2021-04-01 ENCOUNTER — Ambulatory Visit: Payer: Medicare Other | Admitting: Podiatry

## 2021-04-01 DIAGNOSIS — L989 Disorder of the skin and subcutaneous tissue, unspecified: Secondary | ICD-10-CM

## 2021-04-01 DIAGNOSIS — M2031 Hallux varus (acquired), right foot: Secondary | ICD-10-CM

## 2021-04-01 DIAGNOSIS — M2030 Hallux varus (acquired), unspecified foot: Secondary | ICD-10-CM | POA: Insufficient documentation

## 2021-04-01 DIAGNOSIS — M79675 Pain in left toe(s): Secondary | ICD-10-CM | POA: Diagnosis not present

## 2021-04-01 DIAGNOSIS — M79674 Pain in right toe(s): Secondary | ICD-10-CM

## 2021-04-01 DIAGNOSIS — B351 Tinea unguium: Secondary | ICD-10-CM | POA: Diagnosis not present

## 2021-04-01 NOTE — Progress Notes (Signed)
This patient returns to my office for at risk foot care.  This patient requires this care by a professional since this patient will be at risk due to having peripheral vascular disease and coagulation defect.  Patient is taking coumadin.  This patient is unable to cut nails himself since the patient cannot reach his nails.These nails are painful walking and wearing shoes.  This patient presents for at risk foot care today.  General Appearance  Alert, conversant and in no acute stress.  Vascular  Dorsalis pedis and posterior tibial  pulses are weakly  palpable  bilaterally.  Capillary return is within normal limits  bilaterally. Temperature is within normal limits  Bilaterally. Absent digital hair.  Neurologic  Senn-Weinstein monofilament wire test within normal limits  bilaterally. Muscle power within normal limits bilaterally.  Nails Thick disfigured discolored nails with subungual debris  from hallux to fifth toes bilaterally. No evidence of bacterial infection or drainage bilaterally.  Orthopedic  No limitations of motion  feet .  No crepitus or effusions noted.  No bony pathology or digital deformities noted.  Hallux malleus right foot.  Skin  normotropic skin with no porokeratosis noted bilaterally.  No signs of infections or ulcers noted.     Onychomycosis  Pain in right toes  Pain in left toes  Hallux malleus.  Consent was obtained for treatment procedures.   Mechanical debridement of nails 1-5  bilaterally performed with a nail nipper.  Filed with dremel without incident. Discussed hallux with this patient and padding was dispensed.   Return office visit   3 months                  Told patient to return for periodic foot care and evaluation due to potential at risk complications.   Gardiner Barefoot DPM

## 2021-05-06 ENCOUNTER — Telehealth: Payer: Self-pay | Admitting: *Deleted

## 2021-05-06 NOTE — Telephone Encounter (Signed)
CALLED PATIENT'S DAUGHTER- [name] DAVIS TO INFORM THAT SCAN HAS NOT BEEN Robert Fisher. BY HIS INSURANCE , AND THEREFORE I CANNOT SCHEDULE IT UNTIL IT IS PRE-AUTH, LVM FOR A RETURN CALL

## 2021-05-29 ENCOUNTER — Telehealth: Payer: Self-pay | Admitting: *Deleted

## 2021-05-29 NOTE — Telephone Encounter (Signed)
RETURNED PATIENT'S DAUGHTER;'S ([name] JONES) PHONE CALL, SPOKE WITH MS. Ronnald Ramp

## 2021-06-25 ENCOUNTER — Encounter: Payer: Self-pay | Admitting: Podiatry

## 2021-06-25 ENCOUNTER — Ambulatory Visit (INDEPENDENT_AMBULATORY_CARE_PROVIDER_SITE_OTHER): Payer: Medicare Other | Admitting: Podiatry

## 2021-06-25 ENCOUNTER — Other Ambulatory Visit: Payer: Self-pay

## 2021-06-25 DIAGNOSIS — I739 Peripheral vascular disease, unspecified: Secondary | ICD-10-CM

## 2021-06-25 DIAGNOSIS — M79674 Pain in right toe(s): Secondary | ICD-10-CM | POA: Diagnosis not present

## 2021-06-25 DIAGNOSIS — M79675 Pain in left toe(s): Secondary | ICD-10-CM

## 2021-06-25 DIAGNOSIS — B351 Tinea unguium: Secondary | ICD-10-CM | POA: Diagnosis not present

## 2021-06-25 NOTE — Progress Notes (Signed)
This patient returns to my office for at risk foot care.  This patient requires this care by a professional since this patient will be at risk due to having peripheral vascular disease and coagulation defect.  Patient is taking coumadin.  This patient is unable to cut nails himself since the patient cannot reach his nails.These nails are painful walking and wearing shoes.  This patient presents for at risk foot care today.  General Appearance  Alert, conversant and in no acute stress.  Vascular  Dorsalis pedis and posterior tibial  pulses are weakly  palpable  bilaterally.  Capillary return is within normal limits  bilaterally. Temperature is within normal limits  Bilaterally. Absent digital hair.  Neurologic  Senn-Weinstein monofilament wire test within normal limits  bilaterally. Muscle power within normal limits bilaterally.  Nails Thick disfigured discolored nails with subungual debris  from hallux to fifth toes bilaterally. No evidence of bacterial infection or drainage bilaterally.  Orthopedic  No limitations of motion  feet .  No crepitus or effusions noted.  No bony pathology or digital deformities noted.  Hallux malleus right foot.  Skin  normotropic skin with no porokeratosis noted bilaterally.  No signs of infections or ulcers noted.     Onychomycosis  Pain in right toes  Pain in left toes  Hallux malleus.  Consent was obtained for treatment procedures.   Mechanical debridement of nails 1-5  bilaterally performed with a nail nipper.  Filed with dremel without incident. Discussed hallux with this patient and padding was dispensed.   Return office visit   3 months                  Told patient to return for periodic foot care and evaluation due to potential at risk complications.   Gardiner Barefoot DPM

## 2021-07-02 ENCOUNTER — Ambulatory Visit: Payer: Medicare Other | Admitting: Podiatry

## 2021-07-10 ENCOUNTER — Other Ambulatory Visit: Payer: Self-pay | Admitting: Radiation Oncology

## 2021-07-10 ENCOUNTER — Other Ambulatory Visit: Payer: Self-pay

## 2021-07-10 DIAGNOSIS — C3412 Malignant neoplasm of upper lobe, left bronchus or lung: Secondary | ICD-10-CM

## 2021-07-11 ENCOUNTER — Other Ambulatory Visit: Payer: Self-pay

## 2021-07-11 ENCOUNTER — Inpatient Hospital Stay: Payer: Medicare Other | Attending: Radiation Oncology

## 2021-07-11 ENCOUNTER — Ambulatory Visit (HOSPITAL_COMMUNITY)
Admission: RE | Admit: 2021-07-11 | Discharge: 2021-07-11 | Disposition: A | Discharge status: Home / Self Care | Payer: Medicare Other | Source: Ambulatory Visit | Attending: Radiation Oncology | Admitting: Radiation Oncology

## 2021-07-11 DIAGNOSIS — C3412 Malignant neoplasm of upper lobe, left bronchus or lung: Secondary | ICD-10-CM

## 2021-07-11 LAB — BUN & CREATININE (CHCC)
BUN: 19 mg/dL (ref 8–23)
Creatinine: 1.26 mg/dL — ABNORMAL HIGH (ref 0.61–1.24)
GFR, Estimated: 56 mL/min — ABNORMAL LOW (ref >60)

## 2021-07-11 MED ORDER — IOHEXOL 350 MG/ML SOLN
100.0000 mL | Freq: Once | INTRAVENOUS | Status: AC | PRN
  Administered 2021-07-11: 60 mL via INTRAVENOUS

## 2021-07-15 ENCOUNTER — Ambulatory Visit
Admission: RE | Admit: 2021-07-15 | Discharge: 2021-07-15 | Disposition: A | Discharge status: Home / Self Care | Payer: Medicare Other | Source: Ambulatory Visit | Attending: Radiation Oncology | Admitting: Radiation Oncology

## 2021-07-15 ENCOUNTER — Encounter: Payer: Self-pay | Admitting: Radiation Oncology

## 2021-07-15 DIAGNOSIS — C3412 Malignant neoplasm of upper lobe, left bronchus or lung: Secondary | ICD-10-CM

## 2021-07-15 NOTE — Progress Notes (Signed)
Patient states doing well overall. Patient denies pain, cough, shortness of breath, painful swallowing, or choking. Patient expressed having a good appetite w/ better eating habits and a purposeful weight loss of roughly 20lbs.   Meaningful use questions complete.   Patient notified of his 2:00pm telephone appointment and expressed an understanding of it.

## 2021-07-15 NOTE — Progress Notes (Signed)
Radiation Oncology         [phone] ________________________________  Outpatient Follow Up- Conducted via telephone due to current COVID-19 concerns for limiting patient exposure  I spoke with the patient to conduct this consult visit via telephone to spare the patient unnecessary potential exposure in the healthcare setting during the current COVID-19 pandemic. The patient was notified in advance and was offered a [place] meeting to allow for face to face communication but unfortunately reported that they did not have the appropriate resources/technology to support such a visit and instead preferred to proceed with a telephone visit.   Name: Robert Fisher MRN: [id number]  Date of Service: 07/15/2021  DOB: 09/02/1934   Diagnosis:  Stage IA, NSCLC,  squamous cell carcinoma of the left upper lobe  Interval Since Last Radiation: 1 year, 3 months  04/11/2019-04/18/2019 SBRT Treatement: The LUL was treated to 54 Gy in 3 fractions  Narrative:  In summary, this is a pleasant  87 y.o. gentleman with a  Stage IA, NSCLC, squamous cell carcinoma of the left upper lobe. Mr. Marmolejos was noticing cough and weakness and on 12/06/2018 he underwent a CXR that showed mild patchy LUL opacity. He was treated with antibiotics and had a CT on 01/28/2019 that revealed a 15 x 18 x 22 mm solid spiculated mass in the LUL. He did have an 11 mm AP window node and a left hilar node measuring 9 mm. PET scan on 02/14/2019 revealed an SUV of 12.6 in the LUL nodule and no evidence of hypermetabolic mediastinal or hilar adenopathy. He had low level uptake in the left axilla but this was felt to be reactive. He did undergo bronchoscopy on 03/09/2019 which revealed a squamous cell carcinoma. He was not a good surgical candidate for resection so he proceeded with 3 fractions of SBRT which were completed in May 2020.  Since completing treatment, he has had stable CT scans.   He continues to have gallstones, and a lesion in the  uncinate process of the pancreas measuring 1.5 cm that was felt to be stable from a CT scan that was performed in 2020, and this has been recommended to be followed in 2 years time with an abdominal CT with and without contrast at the time of a scan in the summer of 2021.  His most recent CT scan on 07/11/21 showed progressive non mass like changes in the LUL consistent with prior treated lung nodule. There was a 5 mm subpleural nodule in the LLL that was unchanged. No other new findings but again stigmata of atherosclerosis and emphysematous changes were again seen. The cystic lesion in the uncinate process was incompletely visualized.  On review of systems, the patient reports that he is doing well overall. He remains hard of hearing. He reports he is feeling well and denies any difficulty with chest pain or shortness of breath. He does exercises and walks at home inside of his house and does "laps" to keep active. Unfortunately his wife is in a rehabilitation facility after a fall just the day prior to a knee replacement. Their daughter [name] continues to help with managing both of their care. No other complaints are noted.  PAST MEDICAL HISTORY:  Past Medical History:  Diagnosis Date   Borderline diabetes    takes Metformin   Cancer ([place])    lung 2020 per spouse LUL   COPD (chronic obstructive pulmonary disease) ([place])    DVT (deep venous thrombosis) ([place])    Full dentures  Hearing aid worn    B/L   HOH (hard of hearing)    Hypertension    Left upper lobe pulmonary nodule    Memory problem    Pneumonia    Schizo affective schizophrenia ([place])     PAST SURGICAL HISTORY: Past Surgical History:  Procedure Laterality Date   CATARACT EXTRACTION     COLONOSCOPY     FUDUCIAL PLACEMENT N/A 03/09/2019   Procedure: PLACEMENT OF FUDUCIAL;  Surgeon: [name], MD;  Location: [facility];  Service: Thoracic;  Laterality: N/A;   THROMBECTOMY     VIDEO BRONCHOSCOPY WITH ENDOBRONCHIAL  NAVIGATION N/A 03/09/2019   Procedure: VIDEO BRONCHOSCOPY WITH ENDOBRONCHIAL NAVIGATION;  Surgeon: [name], MD;  Location: [facility];  Service: Thoracic;  Laterality: N/A;    PAST SOCIAL HISTORY:  Social History   Socioeconomic History   Marital status: Married    Spouse name: Not on file   Number of children: Not on file   Years of education: Not on file   Highest education level: Not on file  Occupational History   Not on file  Tobacco Use   Smoking status: Former    Packs/day: 1.00    Years: 50.00    Pack years: 50.00    Types: Cigarettes, Pipe    Start date: 80    Quit date: 03/03/2015    Years since quitting: 6.3   Smokeless tobacco: Never   Tobacco comments:    30 yearsv cigarettes ; pipe 20 years  Vaping Use   Vaping Use: Never used  Substance and Sexual Activity   Alcohol use: No   Drug use: No   Sexual activity: Not on file  Other Topics Concern   Not on file  Social History Narrative   Not on file   Social Determinants of Health   Financial Resource Strain: Not on file  Food Insecurity: Not on file  Transportation Needs: Not on file  Physical Activity: Not on file  Stress: Not on file  Social Connections: Not on file  Intimate Partner Violence: Not on file  The patient is married and lives in [place].    PAST FAMILY HISTORY:No family history on file.  MEDICATIONS  Current Outpatient Medications  Medication Sig Dispense Refill   acetaminophen (TYLENOL) 500 MG tablet Take 500-1,000 mg by mouth every 6 (six) hours as needed (FOR PAIN.).     aspirin EC 81 MG tablet Take 81 mg by mouth every evening.      atorvastatin (LIPITOR) 40 MG tablet Take 40 mg by mouth every evening.   2   buPROPion (WELLBUTRIN) 100 MG tablet Take 100 mg by mouth 2 (two) times daily.     clonazePAM (KLONOPIN) 0.5 MG tablet Take 0.25 mg by mouth 2 (two) times daily.     fenofibrate 160 MG tablet Take 160 mg by mouth daily.     furosemide (LASIX) 20 MG tablet Take 20  mg by mouth See admin instructions. Take 1 tablet by mouth on Monday Wednesday and Friday     hydrochlorothiazide (HYDRODIURIL) 12.5 MG tablet Take 12.5 mg by mouth daily.     hydrocortisone cream 1 % Apply 1 application topically daily as needed for itching (For rash).     linagliptin (TRADJENTA) 5 MG TABS tablet Take 5 mg by mouth every other day.     metFORMIN (GLUCOPHAGE) 500 MG tablet Take 500 mg by mouth in the morning, at noon, and at bedtime.      metoprolol  tartrate (LOPRESSOR) 25 MG tablet Take 25 mg by mouth daily.      Multiple Vitamin (MULTIVITAMIN WITH MINERALS) TABS tablet Take 1 tablet by mouth daily.     OLANZapine (ZYPREXA) 10 MG tablet Take 10 mg by mouth at bedtime.     Omega-3 Fatty Acids (FISH OIL PO) Take 1 capsule by mouth daily.     Polyethyl Glycol-Propyl Glycol (SYSTANE FREE OP) Place 1 drop into both eyes in the morning and at bedtime.     potassium chloride SA (KLOR-CON) 20 MEQ tablet Take 10 mEq by mouth daily. Take one tablet (10mg ) by mouth Daily     Probiotic Product (PROBIOTIC PO) Take 1 tablet by mouth every evening.     warfarin (COUMADIN) 5 MG tablet Take 2.5-5 mg by mouth See admin instructions. Take 5mg  tablet by mouth on Mondays, Wednesdays, Fridays, and take 2.5mg  tablet by mouth  on Tuesday, Thursday, Saturday and Sunday     benzonatate (TESSALON) 100 MG capsule Take 1 capsule (100 mg total) by mouth every 8 (eight) hours. (Patient not taking: No sig reported) 21 capsule 0   Capsaicin-Menthol (SALONPAS GEL-PATCH HOT EX) Apply 1 application topically daily as needed (For back pain). (Patient not taking: Reported on 07/15/2021)     HYDROcodone-acetaminophen (NORCO/VICODIN) 5-325 MG tablet Take 1 tablet by mouth every 4 (four) hours as needed. (Patient not taking: Reported on 07/15/2021) 8 tablet 0   No current facility-administered medications for this encounter.    ALLERGIES:  Allergies  Allergen Reactions   Haloperidol Lactate Other (See Comments)     "made him climb the walls."   Sildenafil Other (See Comments)    "pain in right thigh."   Physical Exam: Unable to assess due to encounter type.   Impression/Plan: 1. Stage IA, NSCLC,  squamous cell carcinoma of the left upper lobe. The patient continues to do well clinically per his wife and we will plan to repeat another CT scan in about 6 month's time. They are in agreement. The patient ended up coming in person for this visit, and I was able to also explain the findings to his wife by phone. Given his good performance status he and his daughter agreement to continue with close surveillance. 2. Lesion in the uncinate process of the pancreas. We reviewed that radiology recommended that he undergo a repeat CT scan dedicated of the abdomen. This was recommended 2 years from August 2021, so he will be due for this in 2023 with contrast as this has remained stable on prior imaging. He and his daughter are in agreement with this plan.    Given current concerns for patient exposure during the COVID-19 pandemic, this encounter was conducted via telephone.  The patient has provided two factor identification and has given verbal consent for this type of encounter and has been advised to only accept a meeting of this type in a secure network environment. The time spent during this encounter was 30 minutes including preparation, discussion, and coordination of the patient's care. The attendants for this meeting include Hayden Pedro  and DARIEL BETZER and his daughter Dixon Boos. During the encounter,  Hayden Pedro was located at [facility] Radiation Oncology Department.  Jodene Nam was located at home with his daughter Dixon Boos.      Carola Rhine, PAC

## 2021-07-16 ENCOUNTER — Telehealth: Payer: Self-pay | Admitting: Radiation Oncology

## 2021-07-16 NOTE — Telephone Encounter (Signed)
I let the patient's daughter Dixon Boos know that Dr. Lisbeth Renshaw had reviewed Mr. Wender's recent and prior CT scans, and he would recommend repeat CT in 4-6 months. She will share this information with her parents and orders are in for scan in 4-5 months.     Carola Rhine, PAC

## 2021-07-30 ENCOUNTER — Encounter (HOSPITAL_BASED_OUTPATIENT_CLINIC_OR_DEPARTMENT_OTHER): Payer: Self-pay

## 2021-07-30 ENCOUNTER — Emergency Department (HOSPITAL_BASED_OUTPATIENT_CLINIC_OR_DEPARTMENT_OTHER): Payer: Medicare Other

## 2021-07-30 ENCOUNTER — Other Ambulatory Visit: Payer: Self-pay

## 2021-07-30 ENCOUNTER — Emergency Department (HOSPITAL_BASED_OUTPATIENT_CLINIC_OR_DEPARTMENT_OTHER)
Admission: EM | Admit: 2021-07-30 | Discharge: 2021-07-30 | Disposition: A | Discharge status: Home / Self Care | Payer: Medicare Other | Attending: Emergency Medicine | Admitting: Emergency Medicine

## 2021-07-30 DIAGNOSIS — Z79899 Other long term (current) drug therapy: Secondary | ICD-10-CM | POA: Diagnosis not present

## 2021-07-30 DIAGNOSIS — Z85118 Personal history of other malignant neoplasm of bronchus and lung: Secondary | ICD-10-CM | POA: Insufficient documentation

## 2021-07-30 DIAGNOSIS — Z7982 Long term (current) use of aspirin: Secondary | ICD-10-CM | POA: Insufficient documentation

## 2021-07-30 DIAGNOSIS — Z7901 Long term (current) use of anticoagulants: Secondary | ICD-10-CM | POA: Insufficient documentation

## 2021-07-30 DIAGNOSIS — I10 Essential (primary) hypertension: Secondary | ICD-10-CM | POA: Insufficient documentation

## 2021-07-30 DIAGNOSIS — J449 Chronic obstructive pulmonary disease, unspecified: Secondary | ICD-10-CM | POA: Insufficient documentation

## 2021-07-30 DIAGNOSIS — R569 Unspecified convulsions: Secondary | ICD-10-CM | POA: Diagnosis not present

## 2021-07-30 DIAGNOSIS — Z7984 Long term (current) use of oral hypoglycemic drugs: Secondary | ICD-10-CM | POA: Insufficient documentation

## 2021-07-30 DIAGNOSIS — Z85828 Personal history of other malignant neoplasm of skin: Secondary | ICD-10-CM | POA: Diagnosis not present

## 2021-07-30 DIAGNOSIS — Z87891 Personal history of nicotine dependence: Secondary | ICD-10-CM | POA: Insufficient documentation

## 2021-07-30 DIAGNOSIS — Z8673 Personal history of transient ischemic attack (TIA), and cerebral infarction without residual deficits: Secondary | ICD-10-CM

## 2021-07-30 HISTORY — DX: Unspecified convulsions: R56.9

## 2021-07-30 LAB — URINALYSIS, ROUTINE W REFLEX MICROSCOPIC
Bilirubin Urine: NEGATIVE
Glucose, UA: NEGATIVE mg/dL
Hgb urine dipstick: NEGATIVE
Ketones, ur: NEGATIVE mg/dL
Leukocytes,Ua: NEGATIVE
Nitrite: NEGATIVE
Protein, ur: NEGATIVE mg/dL
Specific Gravity, Urine: 1.023 (ref 1.005–1.030)
pH: 5.5 (ref 5.0–8.0)

## 2021-07-30 LAB — CBC
HCT: 50.4 % (ref 39.0–52.0)
Hemoglobin: 16.1 g/dL (ref 13.0–17.0)
MCH: 29.2 pg (ref 26.0–34.0)
MCHC: 31.9 g/dL (ref 30.0–36.0)
MCV: 91.3 fL (ref 80.0–100.0)
Platelets: 250 10*3/uL (ref 150–400)
RBC: 5.52 MIL/uL (ref 4.22–5.81)
RDW: 15.6 % — ABNORMAL HIGH (ref 11.5–15.5)
WBC: 7.4 10*3/uL (ref 4.0–10.5)
nRBC: 0 % (ref 0.0–0.2)

## 2021-07-30 LAB — BASIC METABOLIC PANEL
Anion gap: 10 (ref 5–15)
BUN: 23 mg/dL (ref 8–23)
CO2: 25 mmol/L (ref 22–32)
Calcium: 9.9 mg/dL (ref 8.9–10.3)
Chloride: 103 mmol/L (ref 98–111)
Creatinine, Ser: 1.25 mg/dL — ABNORMAL HIGH (ref 0.61–1.24)
GFR, Estimated: 57 mL/min — ABNORMAL LOW (ref >60)
Glucose, Bld: 215 mg/dL — ABNORMAL HIGH (ref 70–99)
Potassium: 3.7 mmol/L (ref 3.5–5.1)
Sodium: 138 mmol/L (ref 135–145)

## 2021-07-30 LAB — PROTIME-INR
INR: 1.8 — ABNORMAL HIGH (ref 0.8–1.2)
Prothrombin Time: 21.1 seconds — ABNORMAL HIGH (ref 11.4–15.2)

## 2021-07-30 LAB — CBG MONITORING, ED: Glucose-Capillary: 149 mg/dL — ABNORMAL HIGH (ref 70–99)

## 2021-07-30 MED ORDER — IOHEXOL 350 MG/ML SOLN
75.0000 mL | Freq: Once | INTRAVENOUS | Status: AC | PRN
  Administered 2021-07-30: 75 mL via INTRAVENOUS

## 2021-07-30 MED ORDER — LEVETIRACETAM 500 MG PO TABS: 500.0000 mg | ORAL_TABLET | Freq: Two times a day (BID) | ORAL | 0 refills | Status: DC

## 2021-07-30 NOTE — ED Triage Notes (Signed)
Pt arrives POV with his daughter.  Daughter states she witnessed pt having a 3-5 second seizure on Sunday evening.  Daughter states he has a remote history of seizures.  No seizure activity since Sunday night.

## 2021-07-30 NOTE — Discharge Instructions (Addendum)
Return to the emergency room or call the ambulance for persistent seizure-like activity. Follow-up with neurology in the clinic.

## 2021-07-30 NOTE — ED Provider Notes (Signed)
[facility] EMERGENCY DEPT Provider Note   CSN: [id number] Arrival date & time: 07/30/21  1311     History Chief Complaint  Patient presents with   Seizures    Robert Fisher is a 87 y.o. male.  Patient presents with daughter for assessment since brief shaking event on Sunday.  Daughter witnessed him have a 3-second generalized shaking event followed by falling asleep.  No seizure activity recently.  Years ago patient had a seizure secondary to taking excessive medication for which she is no longer on.  Patient recently received clearance from lung cancer and will follow up in 6 months.  Patient says on Sunday he was simply falling asleep and refused to come in until daughter convinced him today.  Patient is on warfarin.  Patient has memory problems and daughter takes care of him while at home.  No fevers or chills, no shortness of breath or chest pain, no syncope.  Patient been acting normal since Sunday.  No known metastasis.      Past Medical History:  Diagnosis Date   Borderline diabetes    takes Metformin   Cancer ([place])    lung 2020 per spouse LUL   COPD (chronic obstructive pulmonary disease) ([place])    DVT (deep venous thrombosis) ([place])    Full dentures    Hearing aid worn    B/L   HOH (hard of hearing)    Hypertension    Left upper lobe pulmonary nodule    Memory problem    Pneumonia    Schizo affective schizophrenia ([place])    Seizures ([place])     Patient Active Problem List   Diagnosis Date Noted   Hallux malleus 04/01/2021   Malignant neoplasm of upper lobe of left lung ([place]) 06/25/2020   ADVEF, DRUG/MEDICINAL/BIOLOGICAL SUBST NOS 08/30/2007   HYPERLIPIDEMIA NEC/NOS 06/08/2007   SCHIZOPHRENIA, PARANOID, UNSPECIFIED 03/19/2007   PERIPHERAL VASCULAR DISEASE 03/19/2007   SKIN CANCER, HX OF 03/19/2007   PULMONARY EMBOLISM, HX OF 03/19/2007    Past Surgical History:  Procedure Laterality Date   CATARACT EXTRACTION     COLONOSCOPY     FUDUCIAL  PLACEMENT N/A 03/09/2019   Procedure: PLACEMENT OF FUDUCIAL;  Surgeon: [name], MD;  Location: [facility];  Service: Thoracic;  Laterality: N/A;   THROMBECTOMY     VIDEO BRONCHOSCOPY WITH ENDOBRONCHIAL NAVIGATION N/A 03/09/2019   Procedure: VIDEO BRONCHOSCOPY WITH ENDOBRONCHIAL NAVIGATION;  Surgeon: [name], MD;  Location: [facility];  Service: Thoracic;  Laterality: N/A;       No family history on file.  Social History   Tobacco Use   Smoking status: Former    Packs/day: 1.00    Years: 50.00    Pack years: 50.00    Types: Cigarettes, Pipe    Start date: 2    Quit date: 03/03/2015    Years since quitting: 6.4   Smokeless tobacco: Never   Tobacco comments:    30 yearsv cigarettes ; pipe 20 years  Vaping Use   Vaping Use: Never used  Substance Use Topics   Alcohol use: No   Drug use: No    Home Medications Prior to Admission medications   Medication Sig Start Date End Date Taking? Authorizing Provider  aspirin EC 81 MG tablet Take 81 mg by mouth every evening.    Yes [provider]  atorvastatin (LIPITOR) 40 MG tablet Take 40 mg by mouth every evening.  09/14/15  Yes [provider]  buPROPion (WELLBUTRIN) 100  MG tablet Take 100 mg by mouth 2 (two) times daily.   Yes [provider]  clonazePAM (KLONOPIN) 0.5 MG tablet Take 0.25 mg by mouth 2 (two) times daily.   Yes [provider]  fenofibrate 160 MG tablet Take 160 mg by mouth daily.   Yes [provider]  furosemide (LASIX) 20 MG tablet Take 20 mg by mouth See admin instructions. Take 1 tablet by mouth on Monday Wednesday and Friday   Yes [provider]  hydrochlorothiazide (HYDRODIURIL) 12.5 MG tablet Take 12.5 mg by mouth daily.   Yes [provider]  hydrocortisone cream 1 % Apply 1 application topically daily as needed for itching (For rash).   Yes [provider]  metFORMIN (GLUCOPHAGE) 500 MG tablet Take 500 mg by mouth in the  morning, at noon, and at bedtime.    Yes [provider]  metoprolol tartrate (LOPRESSOR) 25 MG tablet Take 25 mg by mouth daily.    Yes [provider]  Multiple Vitamin (MULTIVITAMIN WITH MINERALS) TABS tablet Take 1 tablet by mouth daily.   Yes [provider]  OLANZapine (ZYPREXA) 10 MG tablet Take 10 mg by mouth at bedtime.   Yes [provider]  Omega-3 Fatty Acids (FISH OIL PO) Take 1 capsule by mouth daily.   Yes [provider]  Polyethyl Glycol-Propyl Glycol (SYSTANE FREE OP) Place 1 drop into both eyes in the morning and at bedtime.   Yes [provider]  potassium chloride SA (KLOR-CON) 20 MEQ tablet Take 10 mEq by mouth daily. Take one tablet (10mg ) by mouth Daily   Yes [provider]  Probiotic Product (PROBIOTIC PO) Take 1 tablet by mouth every evening.   Yes [provider]  warfarin (COUMADIN) 5 MG tablet Take 2.5-5 mg by mouth See admin instructions. Take 5mg  tablet by mouth on Mondays, Wednesdays, Fridays, and take 2.5mg  tablet by mouth  on Tuesday, Thursday, Saturday and Sunday   Yes [provider]  acetaminophen (TYLENOL) 500 MG tablet Take 500-1,000 mg by mouth every 6 (six) hours as needed (FOR PAIN.).    [provider]  linagliptin (TRADJENTA) 5 MG TABS tablet Take 5 mg by mouth every other day.    [provider]    Allergies    Haloperidol lactate and Sildenafil  Review of Systems   Review of Systems  Constitutional:  Negative for chills and fever.  HENT:  Negative for congestion.   Eyes:  Negative for visual disturbance.  Respiratory:  Negative for shortness of breath.   Cardiovascular:  Negative for chest pain.  Gastrointestinal:  Negative for abdominal pain and vomiting.  Genitourinary:  Negative for dysuria and flank pain.  Musculoskeletal:  Negative for back pain, neck pain and neck stiffness.  Skin:  Negative for rash.  Neurological:  Positive for seizures.  Negative for weakness, light-headedness and headaches.   Physical Exam Updated Vital Signs BP 140/61 (BP Location: Right Arm)   Pulse 63   Temp 98.2 F (36.8 C)   Resp 17   Ht 5\' 8"  (1.727 m)   Wt 79.4 kg   SpO2 97%   BMI 26.61 kg/m   Physical Exam Vitals and nursing note reviewed.  Constitutional:      General: He is not in acute distress.    Appearance: He is well-developed.  HENT:     Head: Normocephalic and atraumatic.     Mouth/Throat:     Mouth: Mucous membranes are moist.  Eyes:  General:        Right eye: No discharge.        Left eye: No discharge.     Conjunctiva/sclera: Conjunctivae normal.  Neck:     Trachea: No tracheal deviation.  Cardiovascular:     Rate and Rhythm: Normal rate and regular rhythm.  Pulmonary:     Effort: Pulmonary effort is normal.     Breath sounds: Normal breath sounds.  Abdominal:     General: There is no distension.     Palpations: Abdomen is soft.     Tenderness: There is no abdominal tenderness. There is no guarding.  Musculoskeletal:        General: Normal range of motion.     Cervical back: Normal range of motion and neck supple. No rigidity.  Skin:    General: Skin is warm.     Capillary Refill: Capillary refill takes less than 2 seconds.     Findings: No rash.  Neurological:     General: No focal deficit present.     Mental Status: He is alert. Mental status is at baseline.     GCS: GCS eye subscore is 4. GCS verbal subscore is 5. GCS motor subscore is 6.     Cranial Nerves: No cranial nerve deficit.     Motor: No weakness.     Coordination: Coordination is intact.  Psychiatric:        Mood and Affect: Mood normal.    ED Results / Procedures / Treatments   Labs (all labs ordered are listed, but only abnormal results are displayed) Labs Reviewed  BASIC METABOLIC PANEL - Abnormal; Notable for the following components:      Result Value   Glucose, Bld 215 (*)    Creatinine, Ser 1.25 (*)    GFR, Estimated 57  (*)    All other components within normal limits  CBC - Abnormal; Notable for the following components:   RDW 15.6 (*)    All other components within normal limits  PROTIME-INR - Abnormal; Notable for the following components:   Prothrombin Time 21.1 (*)    INR 1.8 (*)    All other components within normal limits  CBG MONITORING, ED - Abnormal; Notable for the following components:   Glucose-Capillary 149 (*)    All other components within normal limits  URINALYSIS, ROUTINE W REFLEX MICROSCOPIC    EKG None  Radiology No results found.  Procedures Procedures   Medications Ordered in ED Medications - No data to display  ED Course  I have reviewed the triage vital signs and the nursing notes.  Pertinent labs & imaging results that were available during my care of the patient were reviewed by me and considered in my medical decision making (see chart for details).    MDM Rules/Calculators/A&P                           Patient presents with daughter after brief shaking event on Sunday.  Patient at baseline, normal neurologic exam for age/baseline.  No signs of infection on exam.  Discussed possibility of brief seizure versus myoclonic jerking versus metabolic related versus other.  Blood work reviewed normal electrolytes and no acute kidney failure, normal sugar.  Patient comfortable in the room.  CT scan of the head pending.  Patient will need follow-up with primary doctor/neurology.  CT scan of the head reviewed showing no acute findings however patient has had a stroke in the  past.  Patient has never been told he has a stroke in the past and did not have any significant signs or symptoms.  Patient does recall having a brief seizure 4 to 6 months ago.  With abnormal CT scan, age, seizure-like activity in the past plan to start [place] tomorrow and close follow-up with neurology and primary doctor.  Discussed if he gets any significant side effect from Keppra to hold until he sees  neurology.  Discussed to return to emergency room for persistent seizure activity.  Blood work reviewed unremarkable.  Patient well-appearing at discharge.  Final Clinical Impression(s) / ED Diagnoses Final diagnoses:  None    Rx / DC Orders ED Discharge Orders     None        Elnora Morrison, MD 07/30/21 1945

## 2021-07-31 ENCOUNTER — Encounter: Payer: Self-pay | Admitting: Neurology

## 2021-08-08 ENCOUNTER — Emergency Department (HOSPITAL_COMMUNITY)
Admission: EM | Admit: 2021-08-08 | Discharge: 2021-08-08 | Disposition: A | Discharge status: Home / Self Care | Payer: Medicare Other | Attending: Emergency Medicine | Admitting: Emergency Medicine

## 2021-08-08 ENCOUNTER — Other Ambulatory Visit: Payer: Self-pay

## 2021-08-08 DIAGNOSIS — Z7982 Long term (current) use of aspirin: Secondary | ICD-10-CM | POA: Insufficient documentation

## 2021-08-08 DIAGNOSIS — J449 Chronic obstructive pulmonary disease, unspecified: Secondary | ICD-10-CM | POA: Insufficient documentation

## 2021-08-08 DIAGNOSIS — R531 Weakness: Secondary | ICD-10-CM | POA: Insufficient documentation

## 2021-08-08 DIAGNOSIS — Z79899 Other long term (current) drug therapy: Secondary | ICD-10-CM | POA: Insufficient documentation

## 2021-08-08 DIAGNOSIS — T887XXA Unspecified adverse effect of drug or medicament, initial encounter: Secondary | ICD-10-CM

## 2021-08-08 DIAGNOSIS — Z87891 Personal history of nicotine dependence: Secondary | ICD-10-CM | POA: Insufficient documentation

## 2021-08-08 DIAGNOSIS — I10 Essential (primary) hypertension: Secondary | ICD-10-CM | POA: Diagnosis not present

## 2021-08-08 DIAGNOSIS — Z85118 Personal history of other malignant neoplasm of bronchus and lung: Secondary | ICD-10-CM | POA: Diagnosis not present

## 2021-08-08 LAB — CBC
HCT: 49.1 % (ref 39.0–52.0)
Hemoglobin: 15.7 g/dL (ref 13.0–17.0)
MCH: 29.7 pg (ref 26.0–34.0)
MCHC: 32 g/dL (ref 30.0–36.0)
MCV: 93 fL (ref 80.0–100.0)
Platelets: 237 10*3/uL (ref 150–400)
RBC: 5.28 MIL/uL (ref 4.22–5.81)
RDW: 15.4 % (ref 11.5–15.5)
WBC: 7.1 10*3/uL (ref 4.0–10.5)
nRBC: 0 % (ref 0.0–0.2)

## 2021-08-08 LAB — BASIC METABOLIC PANEL
Anion gap: 9 (ref 5–15)
BUN: 18 mg/dL (ref 8–23)
CO2: 23 mmol/L (ref 22–32)
Calcium: 9.9 mg/dL (ref 8.9–10.3)
Chloride: 106 mmol/L (ref 98–111)
Creatinine, Ser: 1.32 mg/dL — ABNORMAL HIGH (ref 0.61–1.24)
GFR, Estimated: 53 mL/min — ABNORMAL LOW (ref >60)
Glucose, Bld: 171 mg/dL — ABNORMAL HIGH (ref 70–99)
Potassium: 3.6 mmol/L (ref 3.5–5.1)
Sodium: 138 mmol/L (ref 135–145)

## 2021-08-08 LAB — CBG MONITORING, ED: Glucose-Capillary: 101 mg/dL — ABNORMAL HIGH (ref 70–99)

## 2021-08-08 MED ORDER — LEVETIRACETAM 250 MG PO TABS
250.0000 mg | ORAL_TABLET | Freq: Once | ORAL | Status: AC
  Administered 2021-08-08: 250 mg via ORAL
  Filled 2021-08-08: qty 1

## 2021-08-08 MED ORDER — SODIUM CHLORIDE 0.9 % IV BOLUS: 1000.0000 mL | Freq: Once | INTRAVENOUS | Status: DC

## 2021-08-08 NOTE — Discharge Instructions (Addendum)
I discussed case with neurology today.  You likely have side effects from [place].  Please decrease your dose to 250 mg twice daily.  If you are still feeling weak then you can cut it down to 250 mg daily.   Please keep your neurology appointment on Monday  Return to ER if you have another seizure, lethargy, worse weakness.

## 2021-08-08 NOTE — ED Triage Notes (Signed)
Daughter stated she witnessed a seizure like activity last Sunday. And was seen and prescribed w/ anti seizure meds, but concern for side effects of weakness and dizziness today.

## 2021-08-08 NOTE — ED Provider Notes (Addendum)
[facility] EMERGENCY DEPARTMENT Provider Note   CSN: [id number] Arrival date & time: 08/08/21  1406     History Chief Complaint  Patient presents with   Seizures   Weakness    Robert Fisher is a 87 y.o. male history of COPD, hypertension, seizures here presenting with weakness.  Patient had a seizure about 10 days ago.  Patient came to the ED 2 days later and had a CT that showed no bleed but there was previous stroke.  His labs were unremarkable at that time.  Patient was started on Keppra 500 mg twice daily.  Patient has been feeling weak since then.  He states that it is harder for him to walk.  However there is no further seizure activity.  They did call neurology and his appointment was moved to this coming Monday (in 4 days).  However he was told to come here for further evaluation and possibly see a neurologist  The history is provided by the patient.      Past Medical History:  Diagnosis Date   Borderline diabetes    takes Metformin   Cancer ([place])    lung 2020 per spouse LUL   COPD (chronic obstructive pulmonary disease) ([place])    DVT (deep venous thrombosis) ([place])    Full dentures    Hearing aid worn    B/L   HOH (hard of hearing)    Hypertension    Left upper lobe pulmonary nodule    Memory problem    Pneumonia    Schizo affective schizophrenia ([place])    Seizures ([place])     Patient Active Problem List   Diagnosis Date Noted   Hallux malleus 04/01/2021   Malignant neoplasm of upper lobe of left lung ([place]) 06/25/2020   ADVEF, DRUG/MEDICINAL/BIOLOGICAL SUBST NOS 08/30/2007   HYPERLIPIDEMIA NEC/NOS 06/08/2007   SCHIZOPHRENIA, PARANOID, UNSPECIFIED 03/19/2007   PERIPHERAL VASCULAR DISEASE 03/19/2007   SKIN CANCER, HX OF 03/19/2007   PULMONARY EMBOLISM, HX OF 03/19/2007    Past Surgical History:  Procedure Laterality Date   CATARACT EXTRACTION     COLONOSCOPY     FUDUCIAL PLACEMENT N/A 03/09/2019   Procedure: PLACEMENT OF FUDUCIAL;   Surgeon: [name], MD;  Location: [place];  Service: Thoracic;  Laterality: N/A;   THROMBECTOMY     VIDEO BRONCHOSCOPY WITH ENDOBRONCHIAL NAVIGATION N/A 03/09/2019   Procedure: VIDEO BRONCHOSCOPY WITH ENDOBRONCHIAL NAVIGATION;  Surgeon: [name], MD;  Location: [place];  Service: Thoracic;  Laterality: N/A;       No family history on file.  Social History   Tobacco Use   Smoking status: Former    Packs/day: 1.00    Years: 50.00    Pack years: 50.00    Types: Cigarettes, Pipe    Start date: 59    Quit date: 03/03/2015    Years since quitting: 6.4   Smokeless tobacco: Never   Tobacco comments:    30 yearsv cigarettes ; pipe 20 years  Vaping Use   Vaping Use: Never used  Substance Use Topics   Alcohol use: No   Drug use: No    Home Medications Prior to Admission medications   Medication Sig Start Date End Date Taking? Authorizing Provider  acetaminophen (TYLENOL) 500 MG tablet Take 500-1,000 mg by mouth every 6 (six) hours as needed (FOR PAIN.).    [provider]  aspirin EC 81 MG tablet Take 81 mg by mouth every evening.     [provider]  atorvastatin (LIPITOR) 40 MG tablet Take 40 mg by mouth every evening.  09/14/15   [provider]  buPROPion (WELLBUTRIN) 100 MG tablet Take 100 mg by mouth 2 (two) times daily.    [provider]  clonazePAM (KLONOPIN) 0.5 MG tablet Take 0.25 mg by mouth 2 (two) times daily.    [provider]  fenofibrate 160 MG tablet Take 160 mg by mouth daily.    [provider]  furosemide (LASIX) 20 MG tablet Take 20 mg by mouth See admin instructions. Take 1 tablet by mouth on Monday Wednesday and Friday    [provider]  hydrochlorothiazide (HYDRODIURIL) 12.5 MG tablet Take 12.5 mg by mouth daily.    [provider]  hydrocortisone cream 1 % Apply 1 application topically daily as needed for itching (For rash).    [provider]  levETIRAcetam  (KEPPRA) 500 MG tablet Take 1 tablet (500 mg total) by mouth 2 (two) times daily. 07/30/21   Elnora Morrison, MD  linagliptin (TRADJENTA) 5 MG TABS tablet Take 5 mg by mouth every other day.    [provider]  metFORMIN (GLUCOPHAGE) 500 MG tablet Take 500 mg by mouth in the morning, at noon, and at bedtime.     [provider]  metoprolol tartrate (LOPRESSOR) 25 MG tablet Take 25 mg by mouth daily.     [provider]  Multiple Vitamin (MULTIVITAMIN WITH MINERALS) TABS tablet Take 1 tablet by mouth daily.    [provider]  OLANZapine (ZYPREXA) 10 MG tablet Take 10 mg by mouth at bedtime.    [provider]  Omega-3 Fatty Acids (FISH OIL PO) Take 1 capsule by mouth daily.    [provider]  Polyethyl Glycol-Propyl Glycol (SYSTANE FREE OP) Place 1 drop into both eyes in the morning and at bedtime.    [provider]  potassium chloride SA (KLOR-CON) 20 MEQ tablet Take 10 mEq by mouth daily. Take one tablet (10mg ) by mouth Daily    [provider]  Probiotic Product (PROBIOTIC PO) Take 1 tablet by mouth every evening.    [provider]  warfarin (COUMADIN) 5 MG tablet Take 2.5-5 mg by mouth See admin instructions. Take 5mg  tablet by mouth on Mondays, Wednesdays, Fridays, and take 2.5mg  tablet by mouth  on Tuesday, Thursday, Saturday and Sunday    [provider]    Allergies    Haloperidol lactate and Sildenafil  Review of Systems   Review of Systems  Neurological:  Positive for seizures and weakness.  All other systems reviewed and are negative.  Physical Exam Updated Vital Signs BP 138/66   Pulse (!) 55   Temp (!) 97.5 F (36.4 C)   Resp 14   Ht 5\' 8"  (1.727 m)   Wt 79.4 kg   SpO2 95%   BMI 26.61 kg/m   Physical Exam Vitals and nursing note reviewed.  Constitutional:      Comments: Chronically ill  HENT:     Head: Normocephalic.     Nose: Nose normal.     Mouth/Throat:     Mouth:  Mucous membranes are moist.  Eyes:     Extraocular Movements: Extraocular movements intact.     Pupils: Pupils are equal, round, and reactive to light.  Cardiovascular:     Rate and Rhythm: Normal rate and regular rhythm.     Pulses: Normal pulses.     Heart sounds: Normal heart sounds. No murmur  heard. Pulmonary:     Effort: Pulmonary effort is normal.     Breath sounds: Normal breath sounds.  Abdominal:     General: Abdomen is flat.     Palpations: Abdomen is soft.  Musculoskeletal:        General: Normal range of motion.     Cervical back: Normal range of motion and neck supple.  Skin:    General: Skin is warm.     Capillary Refill: Capillary refill takes less than 2 seconds.  Neurological:     Comments: No obvious facial droop.  Patient has some resting tremors but no eye deviation.  Patient is able to ambulate with assistance.  Psychiatric:        Mood and Affect: Mood normal.        Behavior: Behavior normal.    ED Results / Procedures / Treatments   Labs (all labs ordered are listed, but only abnormal results are displayed) Labs Reviewed  BASIC METABOLIC PANEL - Abnormal; Notable for the following components:      Result Value   Glucose, Bld 171 (*)    Creatinine, Ser 1.32 (*)    GFR, Estimated 53 (*)    All other components within normal limits  CBG MONITORING, ED - Abnormal; Notable for the following components:   Glucose-Capillary 101 (*)    All other components within normal limits  CBC  URINALYSIS, ROUTINE W REFLEX MICROSCOPIC    EKG None  Radiology No results found.  Procedures Procedures   Medications Ordered in ED Medications  levETIRAcetam (KEPPRA) tablet 250 mg (has no administration in time range)    ED Course  I have reviewed the triage vital signs and the nursing notes.  Pertinent labs & imaging results that were available during my care of the patient were reviewed by me and considered in my medical decision making (see chart for  details).    MDM Rules/Calculators/A&P                           EPIFANIO LABRADOR is a 87 y.o. male here with weakness after taking Keppra.  I discussed case with Dr. Malen Gauze.  Since patient's creatinine is 1.3 and patient is 87 years old, he recommend decreasing the Keppra dose to 250 mg twice daily.  He thinks is likely side effect of Keppra.  Patient's lab work is unchanged from repeat CT since patient just had one done 2 days ago.  No need for MRI right now and I do not think he had a stroke since last ED visit.    Final Clinical Impression(s) / ED Diagnoses Final diagnoses:  None    Rx / DC Orders ED Discharge Orders     None        Drenda Freeze, MD 08/08/21 2246    Drenda Freeze, MD 08/08/21 2249

## 2021-08-08 NOTE — ED Provider Notes (Signed)
Emergency Medicine Provider Triage Evaluation Note  Robert Fisher , a 87 y.o. male  was evaluated in triage.  Pt complains of seizure like activity. Daughter states on Sunday night pt looked like he was having a seizure, went to ED on Tuesday and d/c with levetiracetam. CT head neg for stroke. Was told if side effects were unbearable to stop. Has been experiencing dizziness and weakness, no falls. No other seizures. Appt with neurologist on 1st, PCP said to come to ED to get neurology consult.   Review of Systems  Positive: Weakness, dizziness Negative: Seizures, headache  Physical Exam  BP 126/61 (BP Location: Right Arm)   Pulse (!) 58   Temp 97.6 F (36.4 C) (Oral)   Resp 16   Ht 5\' 8"  (1.727 m)   Wt 79.4 kg   SpO2 97%   BMI 26.61 kg/m  Gen:   Awake, no distress   Resp:  Normal effort  MSK:   Moves extremities without difficulty  Other:    Medical Decision Making  Medically screening exam initiated at 2:35 PM.  Appropriate orders placed.  Jodene Nam was informed that the remainder of the evaluation will be completed by another provider, this initial triage assessment does not replace that evaluation, and the importance of remaining in the ED until their evaluation is complete.     Estill Cotta 08/08/21 1443    Tegeler, Gwenyth Allegra, MD 08/08/21 [phone]

## 2021-08-12 ENCOUNTER — Ambulatory Visit: Payer: Medicare Other | Admitting: Neurology

## 2021-08-12 ENCOUNTER — Other Ambulatory Visit: Payer: Self-pay

## 2021-08-12 ENCOUNTER — Encounter: Payer: Self-pay | Admitting: Neurology

## 2021-08-12 VITALS — BP 120/75 | HR 66 | Ht 68.0 in | Wt 176.0 lb

## 2021-08-12 DIAGNOSIS — R251 Tremor, unspecified: Secondary | ICD-10-CM

## 2021-08-12 MED ORDER — LEVETIRACETAM 500 MG PO TABS: ORAL_TABLET | ORAL | 11 refills | Status: DC

## 2021-08-12 NOTE — Patient Instructions (Addendum)
Schedule MRI brain without contrast  2. Schedule 1-hour EEG  3. Continue Keppra 500mg : Take 1/2 tablet twice a day  4. Follow-up in 4 months, call for any changes   Seizure Precautions: 1. If medication has been prescribed for you to prevent seizures, take it exactly as directed.  Do not stop taking the medicine without talking to your doctor first, even if you have not had a seizure in a long time.   2. Avoid activities in which a seizure would cause danger to yourself or to others.  Don't operate dangerous machinery, swim alone, or climb in high or dangerous places, such as on ladders, roofs, or girders.  Do not drive unless your doctor says you may.  3. If you have any warning that you may have a seizure, lay down in a safe place where you can't hurt yourself.    4.  No driving for 6 months from last seizure, as per [facility].   Please refer to the following link on the [place] website for more information: [email or web address]/[email or web address]   5.  Maintain good sleep hygiene.  6.  Contact your doctor if you have any problems that may be related to the medicine you are taking.  7.  Call 911 and bring the patient back to the ED if:        A.  The seizure lasts longer than 5 minutes.       B.  The patient doesn't awaken shortly after the seizure  C.  The patient has new problems such as difficulty seeing, speaking or moving  D.  The patient was injured during the seizure  E.  The patient has a temperature over 102 F (39C)  F.  The patient vomited and now is having trouble breathing

## 2021-08-12 NOTE — Progress Notes (Signed)
NEUROLOGY CONSULTATION NOTE  [name] MRN: [id number] DOB: Apr 19, 1934  Referring provider: Dr. Elnora Morrison (ER) Primary care provider: Dr. Shon Baton  Reason for consult:  new onset seizure  Dear Dr Reather Converse:  Thank you for your kind referral of Robert Fisher for consultation of the above symptoms. Although his history is well known to you, please allow me to reiterate it for the purpose of our medical record. The patient was accompanied to the clinic by his daughter Robert Fisher who also provides collateral information. Records and images were personally reviewed where available.   HISTORY OF PRESENT ILLNESS: This is a pleasant 87 year old right-handed man with a history of hypertension, COPD, lung cancer, schizophrenia, presenting for evaluation of seizure. Robert Fisher reports that he had a seizure 20 years ago from "too much Tegretol." They were at a restaurant and he had full body shaking. His schizophrenia has been stable on medication for 25 years now. No further seizures until 07/28/21, when [facility] witnessed a 3-second episode of generalized shaking followed by falling asleep. Robert Fisher recalls they were watching [place] Ro when she saw his whole body shaking and eyes going in strange directions. She tapped him on the shoulder, his head was turned to one side (she cannot recall which side), this lasted 5 seconds then he told his daughter he was simply falling asleep and refused to go to the ER until 2 days later. She had checked his BP an hour later and it was normal. He had been otherwise fine the whole day and missing his wife who has been in rehab. In the ER, he reported having a "feeling in his head" 4-6 months prior where he has twitching in his head. CBC, BMP were normal. 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 They were unaware of any stroke symptoms in the past. He was discharged home on Levetiracetam 500mg  BID. He was back in the ER on 08/08/21 due to generalized weakness, he could hardly stand up.  Repeat bloodwork showed slightly worsened creatinine from 1.25 to 1.32. Levetiracetam was reduced to 250mg  BID, with no further weakness since then. He uses a walker at home. Robert Fisher denies any episodes of staring/unresponsiveness, he denies any olfactory/gustatory hallucinations, deja vu, rising epigastric sensation, focal numbness/tingling/weakness. He manages his own medications and finances without issues, denies missing clonazepam dose. He accidentally wrote a check recently, overdrawn by accident due to all his donations. He does not drive. He had a normal birth and early development.  There is no history of febrile convulsions, CNS infections such as meningitis/encephalitis, significant traumatic brain injury, neurosurgical procedures, or family history of seizures.   PAST MEDICAL HISTORY: Past Medical History:  Diagnosis Date   Borderline diabetes    takes Metformin   Cancer ([place])    lung 2020 per spouse LUL   COPD (chronic obstructive pulmonary disease) ([place])    DVT (deep venous thrombosis) ([facility])    Full dentures    Hearing aid worn    B/L   HOH (hard of hearing)    Hypertension    Left upper lobe pulmonary nodule    Memory problem    Pneumonia    Schizo affective schizophrenia ([place])    Seizures ([place])     PAST SURGICAL HISTORY: Past Surgical History:  Procedure Laterality Date   CATARACT  EXTRACTION     COLONOSCOPY     FUDUCIAL PLACEMENT N/A 03/09/2019   Procedure: PLACEMENT OF FUDUCIAL;  Surgeon: [name], MD;  Location: [facility];  Service: Thoracic;  Laterality: N/A;   THROMBECTOMY     VIDEO BRONCHOSCOPY WITH ENDOBRONCHIAL NAVIGATION N/A 03/09/2019   Procedure: VIDEO BRONCHOSCOPY WITH ENDOBRONCHIAL NAVIGATION;  Surgeon: [name], MD;  Location: [place];   Service: Thoracic;  Laterality: N/A;    MEDICATIONS: Current Outpatient Medications on File Prior to Visit  Medication Sig Dispense Refill   acetaminophen (TYLENOL) 500 MG tablet Take 500-1,000 mg by mouth every 6 (six) hours as needed (FOR PAIN.).     aspirin EC 81 MG tablet Take 81 mg by mouth every evening.      atorvastatin (LIPITOR) 40 MG tablet Take 40 mg by mouth every evening.   2   buPROPion (WELLBUTRIN) 100 MG tablet Take 100 mg by mouth 2 (two) times daily.     clonazePAM (KLONOPIN) 0.5 MG tablet Take 0.25 mg by mouth 2 (two) times daily.     fenofibrate 160 MG tablet Take 160 mg by mouth daily.     furosemide (LASIX) 20 MG tablet Take 20 mg by mouth See admin instructions. Take 1 tablet by mouth on Monday Wednesday and Friday     hydrochlorothiazide (HYDRODIURIL) 12.5 MG tablet Take 12.5 mg by mouth daily.     hydrocortisone cream 1 % Apply 1 application topically daily as needed for itching (For rash).     levETIRAcetam (KEPPRA) 500 MG tablet Take 1 tablet (500 mg total) by mouth 2 (two) times daily. 60 tablet 0   linagliptin (TRADJENTA) 5 MG TABS tablet Take 5 mg by mouth every other day.     metFORMIN (GLUCOPHAGE) 500 MG tablet Take 500 mg by mouth in the morning, at noon, and at bedtime.      metoprolol tartrate (LOPRESSOR) 25 MG tablet Take 25 mg by mouth daily.      Multiple Vitamin (MULTIVITAMIN WITH MINERALS) TABS tablet Take 1 tablet by mouth daily.     OLANZapine (ZYPREXA) 10 MG tablet Take 10 mg by mouth at bedtime.     Omega-3 Fatty Acids (FISH OIL PO) Take 1 capsule by mouth daily.     Polyethyl Glycol-Propyl Glycol (SYSTANE FREE OP) Place 1 drop into both eyes in the morning and at bedtime.     potassium chloride SA (KLOR-CON) 20 MEQ tablet Take 10 mEq by mouth daily. Take one tablet (10mg ) by mouth Daily     Probiotic Product (PROBIOTIC PO) Take 1 tablet by mouth every evening.     warfarin (COUMADIN) 5 MG tablet Take 2.5-5 mg by mouth See admin instructions. Take  5mg  tablet by mouth on Mondays, Wednesdays, Fridays, and take 2.5mg  tablet by mouth  on Tuesday, Thursday, Saturday and Sunday     No current facility-administered medications on file prior to visit.    ALLERGIES: Allergies  Allergen Reactions   Haloperidol Lactate Other (See Comments)    "made him climb the walls."   Sildenafil Other (See Comments)    "pain in right thigh."    FAMILY HISTORY: No family history on file.  SOCIAL HISTORY: Social History   Socioeconomic History   Marital status: Married    Spouse name: Not on file   Number of children: Not on file   Years of education: Not on file   Highest education level: Not on file  Occupational History   Not on file  Tobacco Use   Smoking status: Former    Packs/day: 1.00    Years: 50.00    Pack years: 50.00    Types: Cigarettes, Pipe    Start date: 42    Quit date: 03/03/2015    Years since quitting: 6.4   Smokeless tobacco: Never   Tobacco comments:    30 yearsv cigarettes ; pipe 20 years  Vaping Use   Vaping Use: Never used  Substance and Sexual Activity   Alcohol use: No   Drug use: No   Sexual activity: Not on file  Other Topics Concern   Not on file  Social History Narrative   Not on file   Social Determinants of Health   Financial Resource Strain: Not on file  Food Insecurity: Not on file  Transportation Needs: Not on file  Physical Activity: Not on file  Stress: Not on file  Social Connections: Not on file  Intimate Partner Violence: Not on file     PHYSICAL EXAM: Vitals:   08/12/21 1250  BP: 120/75  Pulse: 66  SpO2: 96%   General: No acute distress Head:  Normocephalic/atraumatic Skin/Extremities: No rash, no edema Neurological Exam: Mental status: alert and oriented to person, place, and time, no dysarthria or aphasia, Fund of knowledge is appropriate.  Recent and remote memory are impaired.  Attention and concentration are reduced.  Able to name objects and repeat phrases. MMSE  26/30 MMSE - Mini Mental State Exam 08/12/2021  Orientation to time 5  Orientation to Place 5  Registration 3  Attention/ Calculation 3  Recall 1  Language- name 2 objects 2  Language- repeat 1  Language- follow 3 step command 3  Language- read & follow direction 1  Write a sentence 1  Copy design 1  Total score 26    Cranial nerves: CN I: not tested CN II: pupils equal, round and reactive to light, visual fields intact CN III, IV, VI:  full range of motion, no nystagmus, no ptosis CN V: facial sensation intact CN VII: upper and lower face symmetric CN VIII: hard of hearing, required assistive hearing device Bulk & Tone: normal, no fasciculations. Motor: 5/5 throughout with no pronator drift. Sensation: intact to light touch, cold, pin, vibration and joint position sense.  No extinction to double simultaneous stimulation.  Romberg test negative Deep Tendon Reflexes: +1 throughout Cerebellar: no incoordination on finger to nose testing Gait: slow and cautious with walker, no ataxia Tremor: none   IMPRESSION: This is a pleasant 87 year old right-handed man with a history of hypertension, COPD, lung cancer, schizophrenia, presenting for evaluation of seizure. He report an episode 4-6 months ago of a "feeling in his head"/head twitch, then had a witnessed episode of generalized shaking that was quite brief last 07/28/21. Etiology unclear, MRI brain with and without contrast and 1-hour EEG will be ordered. He is taking Levetiracetam 250mg  BID, refills sent. He does not drive. Follow-up in 4 months, call for any changes.   Thank you for allowing me to participate in the care of this patient. Please do not hesitate to call for any questions or concerns.   Ellouise Newer, M.D.  CC: Dr. Reather Converse, Dr. Virgina Jock

## 2021-08-14 ENCOUNTER — Other Ambulatory Visit: Payer: Self-pay

## 2021-08-14 ENCOUNTER — Ambulatory Visit (INDEPENDENT_AMBULATORY_CARE_PROVIDER_SITE_OTHER): Payer: Medicare Other | Admitting: Neurology

## 2021-08-14 DIAGNOSIS — R251 Tremor, unspecified: Secondary | ICD-10-CM

## 2021-08-15 ENCOUNTER — Ambulatory Visit: Payer: Medicare Other | Admitting: Neurology

## 2021-08-19 NOTE — Procedures (Signed)
ELECTROENCEPHALOGRAM REPORT  Date of Study: 08/14/2021  Patient's Name: Robert Fisher MRN: [id number] Date of Birth: 1934-01-02  Referring Provider: Dr. Ellouise Newer  Clinical History: This is an 87 year old man with a history of seizure 20 years ago attributed to medication, with episodes of twitching in his head, body shaking. EEG for classification.  Medications: KEPPRA 500 MG tablet KLONOPIN 0.5 MG tablet TYLENOL 500 MG tablet ZYPREXA 10 MG tablet aspirin EC 81 MG tablet LIPITOR 40 MG tablet WELLBUTRIN 100 MG tablet fenofibrate 160 MG tablet LASIX 20 MG tablet HYDRODIURIL 12.5 MG tablet hydrocortisone cream 1 % TRADJENTA 5 MG TABS tablet GLUCOPHAGE 500 MG tablet LOPRESSOR 25 MG tablet MULTIVITAMIN WITH MINERALS TABS tablet FISH OIL PO SYSTANE FREE OP KLOR-CON 20 MEQ tablet PROBIOTIC PO COUMADIN 5 MG tablet  Technical Summary: A multichannel digital 1-hour EEG recording measured by the international 10-20 system with electrodes applied with paste and impedances below 5000 ohms performed in our laboratory with EKG monitoring in an awake and asleep patient.  Hyperventilation was not performed. Photic stimulation was performed.  The digital EEG was referentially recorded, reformatted, and digitally filtered in a variety of bipolar and referential montages for optimal display.    Description: The patient is awake and asleep during the recording.  During maximal wakefulness, there is no clear posterior dominant rhythm seen. The record is symmetric.  During drowsiness and sleep, there is an increase in theta slowing of the background.  Vertex waves and symmetric sleep spindles were seen.  Photic stimulation did not elicit any abnormalities.  There were no epileptiform discharges or electrographic seizures seen.    EKG lead was unremarkable.  Impression: This 1-hour awake and asleep EEG is within normal limits.  Clinical Correlation: A normal EEG does not exclude a  clinical diagnosis of epilepsy.  If further clinical questions remain, prolonged EEG may be helpful.  Clinical correlation is advised.   Ellouise Newer, M.D.

## 2021-08-25 ENCOUNTER — Ambulatory Visit
Admission: RE | Admit: 2021-08-25 | Discharge: 2021-08-25 | Disposition: A | Discharge status: Home / Self Care | Payer: Medicare Other | Source: Ambulatory Visit | Attending: Neurology | Admitting: Neurology

## 2021-08-25 ENCOUNTER — Other Ambulatory Visit: Payer: Self-pay

## 2021-08-25 DIAGNOSIS — R251 Tremor, unspecified: Secondary | ICD-10-CM

## 2021-08-26 ENCOUNTER — Telehealth: Payer: Self-pay | Admitting: Neurology

## 2021-08-26 DIAGNOSIS — R251 Tremor, unspecified: Secondary | ICD-10-CM

## 2021-08-26 NOTE — Telephone Encounter (Signed)
When pt calls back send to one of the nurses or MA's, need to ask pt questions about seizures and WE will let him know that Dr Delice Lesch would like to order a 24 hour EEG.

## 2021-08-26 NOTE — Telephone Encounter (Signed)
Called to get more information on pt seizures no answer left a voice mail to call the office back

## 2021-08-27 NOTE — Telephone Encounter (Signed)
Pt c/o: seizure Missed medications?  Yes.   Sleep deprived?  No. Alcohol intake?  No. Back to their usual baseline self?  No.. If no, advise go to ER Current medications prescribed by Dr. Delice Lesch: levetiracetam 250 mg BID  Pt daughter found 1/2 pill on the floor after pt had his 2nd seizure unsure if he has missed any others, pt daughter is also going to call his psychologist to see if they will check his bupropion level she has called them no call back. 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  She asking about results for pt EEG and MRI? Order for 24 hour eeg placed she is going to talk to him to see if he will do it,

## 2021-08-27 NOTE — Telephone Encounter (Signed)
Pt daughter called an informed that  Dr Tressie Stalker with psychiatry evaluation and ensuring he takes his medications as instructed. Pls let her know the EEG was normal, however it is not like a pregnancy test that is positive or negative, so if he continues to report seizures, would do the 24-hour EEG. MRI brain no tumor, stroke, or bleed, it showed age-related changes

## 2021-08-27 NOTE — Telephone Encounter (Signed)
Agree with psychiatry evaluation and ensuring he takes his medications as instructed. Pls let her know the EEG was normal, however it is not like a pregnancy test that is positive or negative, so if he continues to report seizures, would do the 24-hour EEG. MRI brain no tumor, stroke, or bleed, it showed age-related changes. Thanks

## 2021-08-27 NOTE — Telephone Encounter (Signed)
Pt called phone was hung up, unable to talk to anyone will try to call again later

## 2021-08-29 ENCOUNTER — Telehealth: Payer: Self-pay | Admitting: Neurology

## 2021-08-29 ENCOUNTER — Other Ambulatory Visit: Payer: Medicare Other

## 2021-08-29 NOTE — Telephone Encounter (Signed)
Passe daughter called in regarding her dad. His PCP wants him to be taken off of the bupropion. They need aquino's opinion about taking him off that. Robert Fisher is waiting on a call from his psychiatrist about taking him off. Since he didn't show signed of a bleed, stroke, the PCP thinks it could be that medicine causing it.

## 2021-08-30 NOTE — Telephone Encounter (Signed)
Spoke with pt daughter and informed her Bupropion can trigger seizures in some patients, if he can tolerate it from a psychiatric standpoint, that is fine, but I would defer to his psychiatrist on how much it helps him. She stated that he has been taken his seizure medication ans she is still trying to talk him into doing the 24 hour EEG

## 2021-08-30 NOTE — Telephone Encounter (Signed)
Bupropion can trigger seizures in some patients, if he can tolerate it from a psychiatric standpoint, that is fine, but I would defer to his psychiatrist on how much it helps him. Thanks

## 2021-09-03 ENCOUNTER — Telehealth: Payer: Self-pay | Admitting: Neurology

## 2021-09-03 NOTE — Telephone Encounter (Signed)
[name] called and said the patient's psychiatrist, Dr. Pauline Good, decreased the patient's bupropion to 100mg  one time a day.  She wants Dr. Delice Lesch to be aware of the change.

## 2021-09-27 ENCOUNTER — Encounter: Payer: Self-pay | Admitting: Podiatry

## 2021-09-27 ENCOUNTER — Other Ambulatory Visit: Payer: Self-pay

## 2021-09-27 ENCOUNTER — Ambulatory Visit: Payer: Medicare Other | Admitting: Podiatry

## 2021-09-27 DIAGNOSIS — M79675 Pain in left toe(s): Secondary | ICD-10-CM

## 2021-09-27 DIAGNOSIS — B351 Tinea unguium: Secondary | ICD-10-CM | POA: Diagnosis not present

## 2021-09-27 DIAGNOSIS — M79674 Pain in right toe(s): Secondary | ICD-10-CM

## 2021-09-27 DIAGNOSIS — M2031 Hallux varus (acquired), right foot: Secondary | ICD-10-CM

## 2021-09-27 DIAGNOSIS — I739 Peripheral vascular disease, unspecified: Secondary | ICD-10-CM

## 2021-09-27 NOTE — Progress Notes (Signed)
This patient returns to my office for at risk foot care.  This patient requires this care by a professional since this patient will be at risk due to having peripheral vascular disease and coagulation defect.  Patient is taking coumadin.  This patient is unable to cut nails himself since the patient cannot reach his nails.These nails are painful walking and wearing shoes.  This patient presents for at risk foot care today.  General Appearance  Alert, conversant and in no acute stress.  Vascular  Dorsalis pedis and posterior tibial  pulses are weakly  palpable  bilaterally.  Capillary return is within normal limits  bilaterally. Temperature is within normal limits  Bilaterally. Absent digital hair.  Neurologic  Senn-Weinstein monofilament wire test within normal limits  bilaterally. Muscle power within normal limits bilaterally.  Nails Thick disfigured discolored nails with subungual debris  from hallux to fifth toes bilaterally. No evidence of bacterial infection or drainage bilaterally.  Orthopedic  No limitations of motion  feet .  No crepitus or effusions noted.  No bony pathology or digital deformities noted.  Hallux malleus right foot.  Skin  normotropic skin with no porokeratosis noted bilaterally.  No signs of infections or ulcers noted.     Onychomycosis  Pain in right toes  Pain in left toes  Hallux malleus.  Consent was obtained for treatment procedures.   Mechanical debridement of nails 1-5  bilaterally performed with a nail nipper.  Filed with dremel without incident. Discussed hallux with this patient and padding was dispensed.   Return office visit   3 months                  Told patient to return for periodic foot care and evaluation due to potential at risk complications.   Gardiner Barefoot DPM

## 2021-10-23 ENCOUNTER — Telehealth: Payer: Self-pay | Admitting: *Deleted

## 2021-10-23 NOTE — Telephone Encounter (Signed)
CALLED PATIENT'S Robert Fisher TO INFORM OF LAB AND CT FOR 11-14-21 AND HIS TELEPHONE FU ON 11-18-21, SPOKE WITH PATIENT'S DAUGHTER - Robert Fisher AND SHE IS AWARE OF THESE APPTS.

## 2021-10-23 NOTE — Telephone Encounter (Signed)
CALLED PATIENT'S DAUGHTER- [name] JONES TO INFORM OF LAB APPT. FOR 11-14-21@ 12:15 PM @ [facility] TO FOLLOW ON 11-14-21- ARRIVAL TIME- 1:15 PM @ [facility], PATIENT TO HAVE WATER ONLY - 4 HRS. PRIOR TO TEST, PATIENT TO RECEIVE RESULTS FROM ALISON PERKINS ON 11-18-21 @ 1:30 PM VIA TELEPHONE, SPOKE WITH PATIENT'S DAUGHTER, BUT SHE IS BUSY AND I WILL CALL HER LATER TODAY

## 2021-11-14 ENCOUNTER — Other Ambulatory Visit: Payer: Self-pay

## 2021-11-14 ENCOUNTER — Ambulatory Visit
Admission: RE | Admit: 2021-11-14 | Discharge: 2021-11-14 | Disposition: A | Discharge status: Home / Self Care | Payer: Medicare Other | Source: Ambulatory Visit | Attending: Radiation Oncology | Admitting: Radiation Oncology

## 2021-11-14 ENCOUNTER — Ambulatory Visit (HOSPITAL_COMMUNITY)
Admission: RE | Admit: 2021-11-14 | Discharge: 2021-11-14 | Disposition: A | Discharge status: Home / Self Care | Payer: Medicare Other | Source: Ambulatory Visit | Attending: Radiation Oncology | Admitting: Radiation Oncology

## 2021-11-14 DIAGNOSIS — C3412 Malignant neoplasm of upper lobe, left bronchus or lung: Secondary | ICD-10-CM

## 2021-11-14 LAB — POCT I-STAT CREATININE: Creatinine, Ser: 1.3 mg/dL — ABNORMAL HIGH (ref 0.61–1.24)

## 2021-11-14 MED ORDER — SODIUM CHLORIDE (PF) 0.9 % IJ SOLN
INTRAMUSCULAR | Status: AC
  Filled 2021-11-14: qty 50

## 2021-11-14 MED ORDER — IOHEXOL 350 MG/ML SOLN
60.0000 mL | Freq: Once | INTRAVENOUS | Status: AC | PRN
  Administered 2021-11-14: 60 mL via INTRAVENOUS

## 2021-11-18 ENCOUNTER — Encounter: Payer: Self-pay | Admitting: Radiation Oncology

## 2021-11-18 ENCOUNTER — Ambulatory Visit
Admission: RE | Admit: 2021-11-18 | Discharge: 2021-11-18 | Disposition: A | Discharge status: Home / Self Care | Payer: Medicare Other | Source: Ambulatory Visit | Attending: Radiation Oncology | Admitting: Radiation Oncology

## 2021-11-18 DIAGNOSIS — K8689 Other specified diseases of pancreas: Secondary | ICD-10-CM

## 2021-11-18 DIAGNOSIS — C3412 Malignant neoplasm of upper lobe, left bronchus or lung: Secondary | ICD-10-CM

## 2021-11-18 NOTE — Progress Notes (Signed)
Radiation Oncology         [phone] ________________________________  Outpatient Follow Up- Conducted via telephone due to current COVID-19 concerns for limiting patient exposure  I spoke with the patient to conduct this consult visit via telephone to spare the patient unnecessary potential exposure in the healthcare setting during the current COVID-19 pandemic. The patient was notified in advance and was offered a [facility] meeting to allow for face to face communication but unfortunately reported that they did not have the appropriate resources/technology to support such a visit and instead preferred to proceed with a telephone visit.   Name: Robert Fisher MRN: [id number]  Date of Service: 11/18/2021  DOB: 31-Oct-1934   Diagnosis:  Stage IA, NSCLC,  squamous cell carcinoma of the left upper lobe  Interval Since Last Radiation: 2 years, 7 months  04/11/2019-04/18/2019 SBRT Treatement: The LUL was treated to 54 Gy in 3 fractions  Narrative:  In summary, this is a pleasant  87 y.o. gentleman with a  Stage IA, NSCLC, squamous cell carcinoma of the left upper lobe. Robert Fisher was noticing cough and weakness and on 12/06/2018 he underwent a CXR that showed mild patchy LUL opacity. He was treated with antibiotics and had a CT on 01/28/2019 that revealed a 15 x 18 x 22 mm solid spiculated mass in the LUL. He did have an 11 mm AP window node and a left hilar node measuring 9 mm. PET scan on 02/14/2019 revealed an SUV of 12.6 in the LUL nodule and no evidence of hypermetabolic mediastinal or hilar adenopathy. He had low level uptake in the left axilla but this was felt to be reactive. He did undergo bronchoscopy on 03/09/2019 which revealed a squamous cell carcinoma. He was not a good surgical candidate for resection.  Rather, he proceeded with 3 fractions of SBRT which were completed in May 2020.  Since completing treatment, he has had stable CT scans.   He continues to have gallstones, and a lesion in  the uncinate process of the pancreas measuring 1.5 cm that was felt to be stable from a CT scan that was performed in 2020, and this has been recommended to be followed in 2 years time with an abdominal CT with and without contrast at the time of a scan in the summer of 2021.   He recently had CT chest with contrast on 11/14/2021.  Again chronic changes consistent with masslike architectural distortion and volume loss in the left upper lobe surrounding the treated lesion were noted a stable left lower lobe pulmonary nodules also again seen measuring 5 mm without new evidence of pulmonary nodularity or concerns for active disease.again changes consistent with atherosclerotic disease were noted along with emphysematous changes and bronchial wall thickening consistent with COPD.  Hepatic steatosis and morphologic changes of the liver appear to be present consistent with features of underlying cirrhosis  On review of systems, the patient reports that he is doing okay overall. He is working with dermatology due to have a basal cell cancer removed from his hand and leg. He is having difficulty with lower extremity edema however. He does have some slowed pace with walking and uses a walker at home but still gets out some days and walks a little bit at a local park. His wife did not end up having her knee replacement due to a fall and he and his daughter and son in law are caring for her trying to get her well enough to proceed with that  knee replacement. He has had  recent seizures and is working with neurology regarding this. He is not having any shortness of breath or difficulty with cough. He continues to have difficulty with hearing. No other complaints are verbalized.     PAST MEDICAL HISTORY:  Past Medical History:  Diagnosis Date   Borderline diabetes    takes Metformin   Cancer ([place])    lung 2020 per spouse LUL   COPD (chronic obstructive pulmonary disease) ([place])    DVT (deep venous thrombosis)  ([facility])    Full dentures    Hearing aid worn    B/L   HOH (hard of hearing)    Hypertension    Left upper lobe pulmonary nodule    Memory problem    Pneumonia    Schizo affective schizophrenia ([place])    Seizures ([place])     PAST SURGICAL HISTORY: Past Surgical History:  Procedure Laterality Date   CATARACT EXTRACTION     COLONOSCOPY     FUDUCIAL PLACEMENT N/A 03/09/2019   Procedure: PLACEMENT OF FUDUCIAL;  Surgeon: [name], MD;  Location: [facility] [place];  Service: Thoracic;  Laterality: N/A;   THROMBECTOMY     VIDEO BRONCHOSCOPY WITH ENDOBRONCHIAL NAVIGATION N/A 03/09/2019   Procedure: VIDEO BRONCHOSCOPY WITH ENDOBRONCHIAL NAVIGATION;  Surgeon: [name], MD;  Location: [facility];  Service: Thoracic;  Laterality: N/A;    PAST SOCIAL HISTORY:  Social History   Socioeconomic History   Marital status: Married    Spouse name: Not on file   Number of children: Not on file   Years of education: Not on file   Highest education level: Not on file  Occupational History   Not on file  Tobacco Use   Smoking status: Former    Packs/day: 1.00    Years: 50.00    Pack years: 50.00    Types: Cigarettes, Pipe    Start date: 44    Quit date: 03/03/2015    Years since quitting: 6.7   Smokeless tobacco: Never   Tobacco comments:    30 yearsv cigarettes ; pipe 20 years  Vaping Use   Vaping Use: Never used  Substance and Sexual Activity   Alcohol use: No   Drug use: No   Sexual activity: Not on file  Other Topics Concern   Not on file  Social History Narrative   Right handed    Lives with wife daughter is with him right now    Social Determinants of Health   Financial Resource Strain: Not on file  Food Insecurity: Not on file  Transportation Needs: Not on file  Physical Activity: Not on file  Stress: Not on file  Social Connections: Not on file  Intimate Partner Violence: Not on file  The patient is married and lives in [place].    PAST FAMILY HISTORY:No family  history on file.  MEDICATIONS  Current Outpatient Medications  Medication Sig Dispense Refill   acetaminophen (TYLENOL) 500 MG tablet Take 500-1,000 mg by mouth every 6 (six) hours as needed (FOR PAIN.).     aspirin EC 81 MG tablet Take 81 mg by mouth every evening.      atorvastatin (LIPITOR) 40 MG tablet Take 40 mg by mouth every evening.   2   buPROPion (WELLBUTRIN) 100 MG tablet Take 100 mg by mouth 2 (two) times daily.     calcium citrate-vitamin D (CITRACAL+D) 315-200 MG-UNIT tablet Take 1 tablet by mouth 2 (two) times daily.  clonazePAM (KLONOPIN) 0.5 MG tablet Take 0.25 mg by mouth 2 (two) times daily.     fenofibrate 160 MG tablet Take 160 mg by mouth daily.     furosemide (LASIX) 20 MG tablet Take 20 mg by mouth See admin instructions. Take 1 tablet by mouth on Monday Wednesday and Friday     hydrochlorothiazide (HYDRODIURIL) 12.5 MG tablet Take 12.5 mg by mouth daily.     hydrocortisone cream 1 % Apply 1 application topically daily as needed for itching (For rash).     levETIRAcetam (KEPPRA) 500 MG tablet Take 1/2 tablet twice a day 30 tablet 11   linagliptin (TRADJENTA) 5 MG TABS tablet Take 5 mg by mouth every other day.     metFORMIN (GLUCOPHAGE) 500 MG tablet Take 500 mg by mouth in the morning, at noon, and at bedtime.      metoprolol tartrate (LOPRESSOR) 25 MG tablet Take 25 mg by mouth daily.      Multiple Vitamin (MULTIVITAMIN WITH MINERALS) TABS tablet Take 1 tablet by mouth daily.     OLANZapine (ZYPREXA) 10 MG tablet Take 10 mg by mouth at bedtime.     Omega-3 Fatty Acids (FISH OIL PO) Take 1 capsule by mouth daily.     Polyethyl Glycol-Propyl Glycol (SYSTANE FREE OP) Place 1 drop into both eyes in the morning and at bedtime.     potassium chloride SA (KLOR-CON) 20 MEQ tablet Take 10 mEq by mouth daily. Take one tablet (10mg ) by mouth Daily     Probiotic Product (PROBIOTIC PO) Take 1 tablet by mouth every evening.     warfarin (COUMADIN) 5 MG tablet Take 2.5-5 mg by  mouth See admin instructions. Take 5mg  tablet by mouth on Mondays, Wednesdays, Fridays, and take 2.5mg  tablet by mouth  on Tuesday, Thursday, Saturday and Sunday     No current facility-administered medications for this encounter.    ALLERGIES:  Allergies  Allergen Reactions   Haloperidol Lactate Other (See Comments)    "made him climb the walls."   Sildenafil Other (See Comments)    "pain in right thigh."   Physical Exam: Unable to assess due to encounter type.   Impression/Plan: 1. Stage IA, NSCLC,  squamous cell carcinoma of the left upper lobe.  The patient appears to be doing well radiographically in the lungs regarding his history of lung cancer.  As he still remains functional and has good performance, he is still a good candidate for surveillance.  We discussed continued follow-up at 84-month intervals with CT imaging.   2. Lesion in the uncinate process of the pancreas.  Again this was followed radiographically and he will be sent for a dedicated CT of the abdomen so this will be added to his order as this is due for this in 2023 with contrast as this has remained stable on prior imaging.  3. Radiographic concerns of cirrhosis. I don't see recent CMP results. I encouraged his daughter to follow up with his PCP as well to determine any clinical concerns as well that Dr. Virgina Jock may have.  4. Seizures. We will follow along with this expectantly as he works with neurology and psychiatry.     Given current concerns for patient exposure during the COVID-19 pandemic, this encounter was conducted via telephone.  The patient has provided two factor identification and has given verbal consent for this type of encounter and has been advised to only accept a meeting of this type in a secure network environment. The time  spent during this encounter was 30 minutes including preparation, discussion, and coordination of the patient's care. The attendants for this meeting include Hayden Pedro  and NOLYN SWAB and his daughter Dixon Boos. During the encounter,  Hayden Pedro was located at [facility] Radiation Oncology Department.  Jodene Nam was located at home with his daughter Dixon Boos.       Carola Rhine, PAC

## 2021-11-18 NOTE — Progress Notes (Signed)
Spoke w/ daughter "[name]" who is cleared to speak w/ [place] on patient's behalf and identified using 2 identifiers. She states "Patient is doing well". No symptoms reported at this time. Meaningful use complete.  [name] notified of patient's "Robert Fisher. Barro's 1:30pm 11/18/21 telephone appointment and verbalized understanding.  Patient preferred contact [phone]

## 2021-12-06 ENCOUNTER — Ambulatory Visit: Payer: Medicare Other | Admitting: Podiatry

## 2021-12-13 ENCOUNTER — Encounter: Payer: Self-pay | Admitting: Neurology

## 2021-12-13 ENCOUNTER — Ambulatory Visit: Payer: Medicare Other | Admitting: Neurology

## 2021-12-13 ENCOUNTER — Other Ambulatory Visit: Payer: Self-pay

## 2021-12-13 VITALS — BP 128/64 | HR 63 | Ht 68.0 in

## 2021-12-13 DIAGNOSIS — R251 Tremor, unspecified: Secondary | ICD-10-CM | POA: Diagnosis not present

## 2021-12-13 MED ORDER — LEVETIRACETAM 500 MG PO TABS: ORAL_TABLET | ORAL | 3 refills | Status: DC

## 2021-12-13 NOTE — Progress Notes (Signed)
NEUROLOGY FOLLOW UP OFFICE NOTE  Robert Fisher [id number] 1934-01-26  HISTORY OF PRESENT ILLNESS: I had the pleasure of seeing Robert Fisher in follow-up in the neurology clinic on 12/13/2021. He is again accompanied by his daughter [name] who helps supplement the history today. The patient was last seen 4 months ago for concern for seizure. He had an episode of whole body shaking on 07/28/21 and was started in the ER on Levetiracetam 500mg  BID. It caused generalized weakness and dose was reduced to 250mg  BID in 08/2021 with improvement in weakness. He had a brain MRI without contrast in 08/2021 which did not show any acute changes, there was moderate diffuse atrophy and mild chronic microvascular disease. His 1-hour wake and sleep EEG in 08/2021 was normal. His daughter called on 08/26/21 that he had another seizure and that she found 1/2 pill on the floor so she was not sure if he missed a pill. Their friend witnessed the episode and reported brief whole body shaking, no tongue bite or incontinence. She also reported that he has multiple personality disorder with 3 personalities, and the first seizure brought him to 2 personalities and the second seizure brought him back to 1 personality. His psychiatrist reduced Wellbutrin dose as well. They deny any further seizures or seizure-like symptoms since 08/2021. He is on low dose Levetiracetam 250mg  BID without side effects. When asked about the multiple personalities, he states "they don't change anymore, they kind of meld together." He denies any auditory hallucinations. He has mild dysarthria, no difficulty swallowing. He ambulates with a walker at home. They report that a month ago, his legs were so swollen, he was shuffling and could hardly walk. He was treated with triamcinolone for a month, then 2 months ago had a biopsy of a lesion on his left leg. He is doing a lot better with walking now. He denies any falls, then [name] reminds him he fell back on his  chair and he remembered it, stating it occurred 3-4 weeks ago.    History on Initial Assessment 08/12/2021: This is a pleasant 88 year old right-handed man with a history of hypertension, COPD, lung cancer, schizophrenia, presenting for evaluation of seizure. [name] reports that he had a seizure 20 years ago from "too much Tegretol." They were at a restaurant and he had full body shaking. His schizophrenia has been stable on medication for 25 years now. No further seizures until 07/28/21, when [facility] witnessed a 3-second episode of generalized shaking followed by falling asleep. [name] recalls they were watching Karsten Ro when she saw his whole body shaking and eyes going in strange directions. She tapped him on the shoulder, his head was turned to one side (she cannot recall which side), this lasted 5 seconds then he told his daughter he was simply falling asleep and refused to go to the ER until 2 days later. She had checked his BP an hour later and it was normal. He had been otherwise fine the whole day and missing his wife who has been in rehab. In the ER, he reported having a "feeling in his head" 4-6 months prior where he has twitching in his head. CBC, BMP were normal. 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 They were unaware of any stroke symptoms in the past. He was discharged home on Levetiracetam  500mg  BID. He was back in the ER on 08/08/21 due to generalized weakness, he could hardly stand up.  Repeat bloodwork showed slightly worsened creatinine from 1.25 to 1.32. Levetiracetam was reduced to 250mg  BID, with no further weakness since then. He uses a walker at home. [name] denies any episodes of staring/unresponsiveness, he denies any olfactory/gustatory hallucinations, deja vu, rising epigastric sensation, focal  numbness/tingling/weakness. He manages his own medications and finances without issues, denies missing clonazepam dose. He accidentally wrote a check recently, overdrawn by accident due to all his donations. He does not drive. He had a normal birth and early development.  There is no history of febrile convulsions, CNS infections such as meningitis/encephalitis, significant traumatic brain injury, neurosurgical procedures, or family history of seizures.   PAST MEDICAL HISTORY: Past Medical History:  Diagnosis Date   Borderline diabetes    takes Metformin   Cancer ([place])    lung 2020 per spouse LUL   COPD (chronic obstructive pulmonary disease) ([place])    DVT (deep venous thrombosis) ([facility])    Full dentures    Hearing aid worn    B/L   HOH (hard of hearing)    Hypertension    Left upper lobe pulmonary nodule    Memory problem    Pneumonia    Schizo affective schizophrenia ([place])    Seizures ([place])     MEDICATIONS: Current Outpatient Medications on File Prior to Visit  Medication Sig Dispense Refill   acetaminophen (TYLENOL) 500 MG tablet Take 500-1,000 mg by mouth every 6 (six) hours as needed (FOR PAIN.).     aspirin EC 81 MG tablet Take 81 mg by mouth every evening.      atorvastatin (LIPITOR) 40 MG tablet Take 40 mg by mouth every evening.   2   buPROPion (WELLBUTRIN) 100 MG tablet Take 100 mg by mouth 2 (two) times daily.     calcium citrate-vitamin D (CITRACAL+D) 315-200 MG-UNIT tablet Take 1 tablet by mouth 2 (two) times daily.     clonazePAM (KLONOPIN) 0.5 MG tablet Take 0.25 mg by mouth 2 (two) times daily.     fenofibrate 160 MG tablet Take 160 mg by mouth daily.     furosemide (LASIX) 20 MG tablet Take 20 mg by mouth See admin instructions. Take 1 tablet by mouth on Monday Wednesday and Friday     hydrochlorothiazide (HYDRODIURIL) 12.5 MG tablet Take 12.5 mg by mouth daily.     hydrocortisone cream 1 % Apply 1 application topically daily as needed for itching (For rash).      levETIRAcetam (KEPPRA) 500 MG tablet Take 1/2 tablet twice a day 30 tablet 11   linagliptin (TRADJENTA) 5 MG TABS tablet Take 5 mg by mouth every other day.     metFORMIN (GLUCOPHAGE) 500 MG tablet Take 500 mg by mouth in the morning, at noon, and at bedtime.      metoprolol tartrate (LOPRESSOR) 25 MG tablet Take 25 mg by mouth daily.      Multiple Vitamin (MULTIVITAMIN WITH MINERALS) TABS tablet Take 1 tablet by mouth daily.     OLANZapine (ZYPREXA) 10 MG tablet Take 10 mg by mouth at bedtime.     Omega-3 Fatty Acids (FISH OIL PO) Take 1 capsule by mouth daily.     Polyethyl Glycol-Propyl Glycol (SYSTANE FREE OP) Place 1 drop into both eyes in the morning and at bedtime.     potassium chloride SA (KLOR-CON) 20 MEQ tablet Take 10 mEq by mouth daily. Take one  tablet (10mg ) by mouth Daily     Probiotic Product (PROBIOTIC PO) Take 1 tablet by mouth every evening.     warfarin (COUMADIN) 5 MG tablet Take 2.5-5 mg by mouth See admin instructions. Take 5mg  tablet by mouth on Mondays, Wednesdays, Fridays, and take 2.5mg  tablet by mouth  on Tuesday, Thursday, Saturday and Sunday     No current facility-administered medications on file prior to visit.    ALLERGIES: Allergies  Allergen Reactions   Haloperidol Lactate Other (See Comments)    "made him climb the walls."   Sildenafil Other (See Comments)    "pain in right thigh."    FAMILY HISTORY: History reviewed. No pertinent family history.  SOCIAL HISTORY: Social History   Socioeconomic History   Marital status: Married    Spouse name: Not on file   Number of children: Not on file   Years of education: Not on file   Highest education level: Not on file  Occupational History   Not on file  Tobacco Use   Smoking status: Former    Packs/day: 1.00    Years: 50.00    Pack years: 50.00    Types: Cigarettes, Pipe    Start date: 1954    Quit date: 03/03/2015    Years since quitting: 6.7   Smokeless tobacco: Never   Tobacco comments:     30  yearsv cigarettes ; pipe 20 years  Vaping Use   Vaping Use: Never used  Substance and Sexual Activity   Alcohol use: No   Drug use: No   Sexual activity: Not on file  Other Topics Concern   Not on file  Social History Narrative   Right handed    Lives with wife daughter is with him right now    Social Determinants of Health   Financial Resource Strain: Not on file  Food Insecurity: Not on file  Transportation Needs: Not on file  Physical Activity: Not on file  Stress: Not on file  Social Connections: Not on file  Intimate Partner Violence: Not on file     PHYSICAL EXAM: Vitals:   12/13/21 1112  BP: 128/64  Pulse: 63  SpO2: 98%   General: No acute distress, sitting on wheelchair Head:  Normocephalic/atraumatic Skin/Extremities: No rash, no edema Neurological Exam: alert and awake. No aphasia. Mild dysarthria. Fund of knowledge is appropriate. Attention and concentration are normal.   Cranial nerves: Pupils equal, round. Extraocular movements intact with no nystagmus. Visual fields full.  No facial asymmetry.  Motor: Bulk and tone normal, muscle strength 5/5 throughout with no pronator drift.   Finger to nose testing intact.  Gait not tested, sitting on wheelchair   IMPRESSION: This is a pleasant 88 yo RH man with a history of hypertension, COPD, lung cancer, schizophrenia, with episodes of shaking. MRI without contrast in 08/2021 no acute changes, there was moderate diffuse atrophy and mild chronic microvascular disease. His 1-hour wake and sleep EEG in 08/2021 was normal. He has been event-free since 08/2021, continue low dose Levetiracetam 250mg  BID, refills sent. Continue follow-up with Psychiatry. If episodes recur, we will plan to proceed with prolonged ambulatory EEG for characterization.He does not drive. Follow-up in 6 months, call for any changes.    Thank you for allowing me to participate in his care.  Please do not hesitate to call for any questions or  concerns.    Ellouise Newer, M.D.   CC: Dr. Virgina Jock

## 2021-12-13 NOTE — Patient Instructions (Signed)
Good to see you.  Continue Levetiracetam 500mg : take 1/2 tablet twice a day  2. Continue follow-up with Psychiatry  3. Follow-up in 6 months, call for any changes   Seizure Precautions: 1. If medication has been prescribed for you to prevent seizures, take it exactly as directed.  Do not stop taking the medicine without talking to your doctor first, even if you have not had a seizure in a long time.   2. Avoid activities in which a seizure would cause danger to yourself or to others.  Don't operate dangerous machinery, swim alone, or climb in high or dangerous places, such as on ladders, roofs, or girders.  Do not drive unless your doctor says you may.  3. If you have any warning that you may have a seizure, lay down in a safe place where you can't hurt yourself.    4.  No driving for 6 months from last seizure, as per [facility].   Please refer to the following link on the [place] website for more information: http://www.epilepsyfoundation.org/[email or web address]/driving/drivingu.cfm   5.  Maintain good sleep hygiene. Avoid alcohol.  6.  Contact your doctor if you have any problems that may be related to the medicine you are taking.  7.  Call 911 and bring the patient back to the ED if:        A.  The seizure lasts longer than 5 minutes.       B.  The patient doesn't awaken shortly after the seizure  C.  The patient has new problems such as difficulty seeing, speaking or moving  D.  The patient was injured during the seizure  E.  The patient has a temperature over 102 F (39C)  F.  The patient vomited and now is having trouble breathing

## 2021-12-24 ENCOUNTER — Ambulatory Visit: Payer: Medicare Other | Admitting: Podiatry

## 2021-12-24 ENCOUNTER — Encounter: Payer: Self-pay | Admitting: Podiatry

## 2021-12-24 ENCOUNTER — Other Ambulatory Visit: Payer: Self-pay

## 2021-12-24 DIAGNOSIS — M79674 Pain in right toe(s): Secondary | ICD-10-CM

## 2021-12-24 DIAGNOSIS — B351 Tinea unguium: Secondary | ICD-10-CM

## 2021-12-24 DIAGNOSIS — M79675 Pain in left toe(s): Secondary | ICD-10-CM | POA: Diagnosis not present

## 2021-12-24 DIAGNOSIS — I739 Peripheral vascular disease, unspecified: Secondary | ICD-10-CM | POA: Diagnosis not present

## 2021-12-24 NOTE — Progress Notes (Signed)
This patient returns to my office for at risk foot care.  This patient requires this care by a professional since this patient will be at risk due to having peripheral vascular disease and coagulation defect.  Patient is taking coumadin.  This patient is unable to cut nails himself since the patient cannot reach his nails.These nails are painful walking and wearing shoes.  This patient presents for at risk foot care today.  General Appearance  Alert, conversant and in no acute stress.  Vascular  Dorsalis pedis and posterior tibial  pulses are weakly  palpable  bilaterally.  Capillary return is within normal limits  bilaterally. Temperature is within normal limits  Bilaterally. Absent digital hair.  Neurologic  Senn-Weinstein monofilament wire test within normal limits  bilaterally. Muscle power within normal limits bilaterally.  Nails Thick disfigured discolored nails with subungual debris  from hallux to fifth toes bilaterally. No evidence of bacterial infection or drainage bilaterally.  Orthopedic  No limitations of motion  feet .  No crepitus or effusions noted.  No bony pathology or digital deformities noted.  Hallux malleus right foot.  Skin  normotropic skin with no porokeratosis noted bilaterally.  No signs of infections or ulcers noted.     Onychomycosis  Pain in right toes  Pain in left toes  Hallux malleus.  Consent was obtained for treatment procedures.   Mechanical debridement of nails 1-5  bilaterally performed with a nail nipper.  Filed with dremel without incident. Discussed hallux with this patient and padding was dispensed.   Return office visit   3 months                  Told patient to return for periodic foot care and evaluation due to potential at risk complications.   Gardiner Barefoot DPM

## 2022-03-28 ENCOUNTER — Other Ambulatory Visit: Payer: Self-pay

## 2022-03-28 ENCOUNTER — Emergency Department (HOSPITAL_BASED_OUTPATIENT_CLINIC_OR_DEPARTMENT_OTHER): Payer: Medicare Other | Admitting: Radiology

## 2022-03-28 ENCOUNTER — Encounter (HOSPITAL_BASED_OUTPATIENT_CLINIC_OR_DEPARTMENT_OTHER): Payer: Self-pay | Admitting: Obstetrics and Gynecology

## 2022-03-28 ENCOUNTER — Emergency Department (HOSPITAL_BASED_OUTPATIENT_CLINIC_OR_DEPARTMENT_OTHER)
Admission: EM | Admit: 2022-03-28 | Discharge: 2022-03-28 | Disposition: A | Discharge status: Home / Self Care | Payer: Medicare Other | Attending: Emergency Medicine | Admitting: Emergency Medicine

## 2022-03-28 DIAGNOSIS — Z87891 Personal history of nicotine dependence: Secondary | ICD-10-CM | POA: Diagnosis not present

## 2022-03-28 DIAGNOSIS — J189 Pneumonia, unspecified organism: Secondary | ICD-10-CM

## 2022-03-28 DIAGNOSIS — Z7902 Long term (current) use of antithrombotics/antiplatelets: Secondary | ICD-10-CM | POA: Diagnosis not present

## 2022-03-28 DIAGNOSIS — M546 Pain in thoracic spine: Secondary | ICD-10-CM | POA: Diagnosis not present

## 2022-03-28 DIAGNOSIS — I509 Heart failure, unspecified: Secondary | ICD-10-CM | POA: Insufficient documentation

## 2022-03-28 DIAGNOSIS — E1122 Type 2 diabetes mellitus with diabetic chronic kidney disease: Secondary | ICD-10-CM | POA: Diagnosis not present

## 2022-03-28 DIAGNOSIS — N189 Chronic kidney disease, unspecified: Secondary | ICD-10-CM | POA: Diagnosis not present

## 2022-03-28 DIAGNOSIS — R790 Abnormal level of blood mineral: Secondary | ICD-10-CM | POA: Insufficient documentation

## 2022-03-28 DIAGNOSIS — Z7982 Long term (current) use of aspirin: Secondary | ICD-10-CM | POA: Insufficient documentation

## 2022-03-28 DIAGNOSIS — R2243 Localized swelling, mass and lump, lower limb, bilateral: Secondary | ICD-10-CM | POA: Insufficient documentation

## 2022-03-28 DIAGNOSIS — R531 Weakness: Secondary | ICD-10-CM | POA: Diagnosis not present

## 2022-03-28 DIAGNOSIS — R059 Cough, unspecified: Secondary | ICD-10-CM | POA: Diagnosis present

## 2022-03-28 DIAGNOSIS — Z7984 Long term (current) use of oral hypoglycemic drugs: Secondary | ICD-10-CM | POA: Insufficient documentation

## 2022-03-28 DIAGNOSIS — Z79899 Other long term (current) drug therapy: Secondary | ICD-10-CM | POA: Insufficient documentation

## 2022-03-28 DIAGNOSIS — M545 Low back pain, unspecified: Secondary | ICD-10-CM | POA: Diagnosis not present

## 2022-03-28 DIAGNOSIS — R791 Abnormal coagulation profile: Secondary | ICD-10-CM | POA: Diagnosis not present

## 2022-03-28 DIAGNOSIS — J181 Lobar pneumonia, unspecified organism: Secondary | ICD-10-CM | POA: Insufficient documentation

## 2022-03-28 HISTORY — DX: Type 2 diabetes mellitus without complications: E11.9

## 2022-03-28 LAB — CBC WITH DIFFERENTIAL/PLATELET
Abs Immature Granulocytes: 0.06 10*3/uL (ref 0.00–0.07)
Basophils Absolute: 0.1 10*3/uL (ref 0.0–0.1)
Basophils Relative: 1 %
Eosinophils Absolute: 0.2 10*3/uL (ref 0.0–0.5)
Eosinophils Relative: 3 %
HCT: 47.3 % (ref 39.0–52.0)
Hemoglobin: 14.9 g/dL (ref 13.0–17.0)
Immature Granulocytes: 1 %
Lymphocytes Relative: 22 %
Lymphs Abs: 1.9 10*3/uL (ref 0.7–4.0)
MCH: 29.5 pg (ref 26.0–34.0)
MCHC: 31.5 g/dL (ref 30.0–36.0)
MCV: 93.7 fL (ref 80.0–100.0)
Monocytes Absolute: 0.9 10*3/uL (ref 0.1–1.0)
Monocytes Relative: 11 %
Neutro Abs: 5.3 10*3/uL (ref 1.7–7.7)
Neutrophils Relative %: 62 %
Platelets: 284 10*3/uL (ref 150–400)
RBC: 5.05 MIL/uL (ref 4.22–5.81)
RDW: 14.9 % (ref 11.5–15.5)
WBC: 8.5 10*3/uL (ref 4.0–10.5)
nRBC: 0 % (ref 0.0–0.2)

## 2022-03-28 LAB — URINALYSIS, ROUTINE W REFLEX MICROSCOPIC
Bilirubin Urine: NEGATIVE
Glucose, UA: NEGATIVE mg/dL
Hgb urine dipstick: NEGATIVE
Ketones, ur: NEGATIVE mg/dL
Leukocytes,Ua: NEGATIVE
Nitrite: NEGATIVE
Specific Gravity, Urine: 1.028 (ref 1.005–1.030)
pH: 7 (ref 5.0–8.0)

## 2022-03-28 LAB — COMPREHENSIVE METABOLIC PANEL
ALT: 29 U/L (ref 0–44)
AST: 31 U/L (ref 15–41)
Albumin: 4.1 g/dL (ref 3.5–5.0)
Alkaline Phosphatase: 30 U/L — ABNORMAL LOW (ref 38–126)
Anion gap: 11 (ref 5–15)
BUN: 19 mg/dL (ref 8–23)
CO2: 26 mmol/L (ref 22–32)
Calcium: 10.3 mg/dL (ref 8.9–10.3)
Chloride: 103 mmol/L (ref 98–111)
Creatinine, Ser: 1.19 mg/dL (ref 0.61–1.24)
GFR, Estimated: 60 mL/min — ABNORMAL LOW (ref >60)
Glucose, Bld: 127 mg/dL — ABNORMAL HIGH (ref 70–99)
Potassium: 4 mmol/L (ref 3.5–5.1)
Sodium: 140 mmol/L (ref 135–145)
Total Bilirubin: 0.4 mg/dL (ref 0.3–1.2)
Total Protein: 6.9 g/dL (ref 6.5–8.1)

## 2022-03-28 LAB — PROTIME-INR
INR: 1.7 — ABNORMAL HIGH (ref 0.8–1.2)
Prothrombin Time: 20 seconds — ABNORMAL HIGH (ref 11.4–15.2)

## 2022-03-28 LAB — BRAIN NATRIURETIC PEPTIDE: B Natriuretic Peptide: 201 pg/mL — ABNORMAL HIGH (ref 0.0–100.0)

## 2022-03-28 MED ORDER — BACITRACIN ZINC 500 UNIT/GM EX OINT
TOPICAL_OINTMENT | CUTANEOUS | Status: AC
  Administered 2022-03-28: 1
  Filled 2022-03-28: qty 28.35

## 2022-03-28 NOTE — ED Notes (Signed)
Pt attempted to urinate with urinal, still unable to urinate. Pt was scanned with bladder scanner which showed 266 mL.  ?

## 2022-03-28 NOTE — ED Notes (Addendum)
Late entry -- Entered room to find pt awake and alert lying in bed; denies pain and sob at this time.  No coughing noted at this time.  Abdomen soft, round,  nontender- pt denies GI complaints of n/v/d.   Labs collected and sent  - UA pending; pt has attempted twice to void with assistance of staff but unable to; pt does state he voided when he first arrived to ED-- pt refusing in and out cath.  Will monitor for acute changes and maintain plan of care.   ?

## 2022-03-28 NOTE — ED Provider Notes (Incomplete)
Past Medical History:  ?Diagnosis Date  ? Borderline diabetes   ? takes Metformin  ? Cancer [facility])   ? lung 2020 per spouse LUL  ? COPD (chronic obstructive pulmonary disease) ([place])   ? Diabetes mellitus without complication ([place])   ? DVT (deep venous thrombosis) ([place])   ? Full dentures   ? Hearing aid worn   ? B/L  ? HOH (hard of hearing)   ? Hypertension   ? Left upper lobe pulmonary nodule   ? Memory problem   ? Pneumonia   ? Schizo affective schizophrenia ([place])   ? Seizures ([place])   ?  ?Physical Exam  ?BP (!) 137/58 (BP Location: Right Arm)   Pulse 72   Temp 98.3 ?F (36.8 ?C)   Resp 14   Ht 5\' 8"  (1.727 m)   Wt 80 kg   SpO2 96%   BMI 26.82 kg/m?  ? ?Physical Exam ? ?Procedures  ?Procedures ? ?ED Course / MDM  ?  ?Medical Decision Making ?Amount and/or Complexity of Data Reviewed ?Radiology: ordered. ? ? ?On antibiotics, thinks he is improving but he has been generally weak ?Prior to this illness, feels like was able to walk with walker to and from bathroom but last few days has needed assistance, sitting in chairs on way. Over the last few weeks 1.5  has been more weak and needed assistance. ?Went from 3-4 pills lasix /week ? ?*** ? ? ? ? ?

## 2022-03-28 NOTE — ED Provider Notes (Signed)
?[facility] DEPT ?Provider Note ? ? ?CSN: [id number] ?Arrival date & time: 03/28/22  1657 ? ?  ? ?History ? ?Chief Complaint  ?Patient presents with  ? Nasal Congestion  ? Cough  ? ? ?Robert Fisher is a 88 y.o. male history of CHF, type 2 diabetes with CKD, PVD and PE currently on warfarin accompanied by his daughter who presents to the ED for evaluation of sinus congestion with subsequent weakness has been ongoing for approximately 1 week.  Patient has been seen by [facility] and has been taking cefdinir with symptomatic improvement of his congestive and URI symptoms.  Also endorsing bilateral mid to lower back pain.  However, daughter is concerned as physical therapy came to the house earlier this week and expressed concern that patient seemed weaker and slightly more unstable.  They recommended that he come to the emergency department for evaluation, however this was a couple days ago and patient has improved since then.  Daughter also notices increased fluid on his legs and had to increase his Lasix from 3 times a week to 4 times a week per the recommendation of his doctor.  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  Patient denies chest pain, shortness of breath, abdominal pain, nausea, vomiting and diarrhea. ? ? ?Cough ? ?  ? ?Home Medications ?Prior to Admission medications   ?Medication Sig Start Date End Date Taking? Authorizing Provider  ?Acetaminophen (TYLENOL ARTHRITIS PAIN PO) Take by mouth 2 (two) times daily.    [provider]  ?aspirin EC 81 MG tablet Take 81 mg by mouth every evening.     [provider]  ?atorvastatin (LIPITOR) 40 MG tablet Take 40 mg by mouth every evening.  09/14/15   [provider]  ?buPROPion (WELLBUTRIN) 100 MG tablet Take 100 mg by mouth 2 (two) times daily. Taking 1 tablet every morning    [provider]  ?calcium citrate-vitamin D (CITRACAL+D) 315-200 MG-UNIT tablet Take 1 tablet by mouth 2 (two) times daily.    [provider]  ?clonazePAM (KLONOPIN) 0.5 MG tablet Take 0.25 mg by mouth 2 (two) times daily.    [provider]  ?fenofibrate 160 MG tablet Take 160 mg by mouth daily.    [provider]  ?furosemide (LASIX) 20 MG tablet Take 20 mg by mouth See admin instructions. Take 1 tablet by mouth on Monday Wednesday and Friday    [provider]  ?hydrochlorothiazide (HYDRODIURIL) 12.5 MG tablet Take 12.5 mg by mouth daily.    [provider]  ?hydrocortisone cream 1 % Apply 1 application topically daily as needed for itching (For rash).    [provider]  ?levETIRAcetam (KEPPRA) 500 MG tablet Take 1/2 tablet twice a day 12/13/21   Cameron Sprang, MD  ?linagliptin (TRADJENTA) 5 MG TABS tablet Take 5 mg by mouth every other day.    [provider]  ?metFORMIN (GLUCOPHAGE) 500 MG tablet Take 500 mg by mouth in the morning, at noon, and at bedtime.     [provider]  ?metoprolol tartrate (LOPRESSOR) 25 MG tablet Take 25 mg by mouth daily.     [provider]  ?Multiple Vitamin (MULTIVITAMIN WITH MINERALS) TABS tablet Take 1 tablet by mouth daily.    [provider]  ?OLANZapine (ZYPREXA) 10 MG tablet Take 15 mg by mouth at bedtime. 1.5 tablets at  night    [provider]  ?Omega-3 Fatty Acids (FISH OIL PO) Take 1 capsule by mouth daily.    [provider]  ?Polyethyl Glycol-Propyl Glycol (SYSTANE FREE OP) Place 1 drop into both eyes in the morning and at bedtime.    [provider]  ?potassium chloride SA (KLOR-CON) 20 MEQ tablet Take 10 mEq by mouth daily. Take one tablet (10mg ) by mouth Daily    [provider]  ?Probiotic Product (PROBIOTIC PO) Take 1 tablet by mouth every evening.    [provider]  ?warfarin (COUMADIN) 5 MG tablet Take 2.5-5 mg by mouth See  admin instructions. Take 5mg  tablet by mouth on Mondays, Wednesdays, Fridays, and take 2.5mg  tablet by mouth  on Tuesday, Thursday, Saturday and Sunday    [provider]  ?   ? ?Allergies    ?Haloperidol lactate and Sildenafil   ? ?Review of Systems   ?Review of Systems  ?Respiratory:  Positive for cough.   ? ?Physical Exam ?Updated Vital Signs ?BP (!) 142/56   Pulse 63   Temp 98.3 ?F (36.8 ?C)   Resp 16   Ht 5\' 8"  (1.727 m)   Wt 80 kg   SpO2 96%   BMI 26.82 kg/m?  ?Physical Exam ?Vitals and nursing note reviewed.  ?Constitutional:   ?   General: He is not in acute distress. ?   Appearance: He is not ill-appearing.  ?HENT:  ?   Head: Atraumatic.  ?Eyes:  ?   Conjunctiva/sclera: Conjunctivae normal.  ?Cardiovascular:  ?   Rate and Rhythm: Normal rate and regular rhythm.  ?   Pulses: Normal pulses.     ?     Radial pulses are 2+ on the right side and 2+ on the left side.  ?     Dorsalis pedis pulses are 2+ on the right side and 2+ on the left side.  ?   Heart sounds: No murmur heard. ?Pulmonary:  ?   Effort: Pulmonary effort is normal. No respiratory distress.  ?   Breath sounds: Normal breath sounds.  ?   Comments: Lung clear to ausculation bilaterally. No tachypnea, no accessory muscle use, no acute distress, no increased work of breathing, no decrease in air movement ? ?Abdominal:  ?   General: Abdomen is flat. There is no distension.  ?   Palpations: Abdomen is soft.  ?   Tenderness: There is no abdominal tenderness.  ?Musculoskeletal:     ?   General: Normal range of motion.  ?   Cervical back: Normal range of motion.  ?   Right lower leg: 1+ Pitting Edema present.  ?   Left lower leg: 1+ Pitting Edema present.  ?Skin: ?   General: Skin is warm and dry.  ?   Capillary Refill: Capillary refill takes less than 2 seconds.  ?Neurological:  ?   General: No focal deficit present.  ?   Mental Status: He is alert.  ?Psychiatric:     ?   Mood and Affect: Mood normal.  ? ? ?ED Results / Procedures /  Treatments   ?Labs ?(all labs ordered are listed, but only abnormal results are displayed) ?Labs Reviewed  ?COMPREHENSIVE METABOLIC PANEL - Abnormal; Notable for the following components:  ?    Result Value  ? Glucose, Bld 127 (*)   ? Alkaline Phosphatase 30 (*)   ? GFR, Estimated 60 (*)   ? All other components within normal limits  ?BRAIN NATRIURETIC  PEPTIDE - Abnormal; Notable for the following components:  ? B Natriuretic Peptide 201.0 (*)   ? All other components within normal limits  ?PROTIME-INR - Abnormal; Notable for the following components:  ? Prothrombin Time 20.0 (*)   ? INR 1.7 (*)   ? All other components within normal limits  ?CBC WITH DIFFERENTIAL/PLATELET  ?URINALYSIS, ROUTINE W REFLEX MICROSCOPIC  ? ? ?EKG ?EKG Interpretation ? ?Date/Time:  Friday March 28 2022 17:40:08 EDT ?Ventricular Rate:  73 ?PR Interval:  218 ?QRS Duration: 101 ?QT Interval:  387 ?QTC Calculation: 427 ?R Axis:   -21 ?Text Interpretation: Sinus or ectopic atrial rhythm Borderline prolonged PR interval Left anterior fascicular block Abnormal R-wave progression, late transition Abnormal T, consider ischemia, lateral leads No significant change since last tracing Confirmed by Gareth Morgan [phone]) on 03/28/2022 6:16:10 PM ? ?Radiology ?DG Chest 2 View ? ?Result Date: 03/28/2022 ?CLINICAL DATA:  Chest congestion, cough, cold symptoms EXAM: CHEST - 2 VIEW COMPARISON:  CT done on 11/14/2021 and chest radiograph done on 03/09/2019 FINDINGS: Transverse diameter of heart is increased. There are 3 fiduciary markers in the left upper lung fields in the parahilar region. There is faint radiopacity adjacent to the fiduciary markers, possibly postradiation changes in the left upper lobe. Small patchy infiltrates are seen in the medial left lower lung fields. There are no signs of alveolar pulmonary edema. There is no significant pleural effusion or pneumothorax. IMPRESSION: Cardiomegaly. There are no signs of pulmonary edema. Increased  markings in the medial left lower lung fields may suggest crowding of normal bronchovascular structures or atelectasis/pneumonia. There is faint 4 cm radiopacity in the left parahilar region adjacent to the fiduciary markers,

## 2022-03-28 NOTE — ED Triage Notes (Signed)
Patient reports to the ER for cold symptoms x5 days. Patient has been taking OTC medication, mucinex, and flonase.  ?

## 2022-03-28 NOTE — Discharge Instructions (Addendum)
Overall, Williams work-up today was very reassuring.  His chest x-ray does show that he may have a small bit of pneumonia in the left lung, however since he is on day 3 of 10 of antibiotics and is already feeling much better, we will continue to treat him with his current antibiotic therapy.  ? ?His x-ray does show that he has an enlarged heart, and he has slightly elevated lab values consistent with congestive heart failure.  I know that he does have a distant history of this, however since he does not have a cardiologist, I have provided you a referral so that they can schedule an appointment for an echocardiogram.  I would also recommend calling the rheumatologist that his wife uses and setting up an appointment for his bilateral knee pain. ? ?Please note that while his labs were very reassuring today, we were unable to obtain a urine sample.  Urinary tract infections can present very differently in elderly people causing weakness and altered mental status.  Please follow-up with his primary care doctor on Monday to provide a urine sample and make sure that this is double checked and they will be able to check on his progress with his pneumonia as well.  If at any point, he is doing poorly, please return immediately to the ED for evaluation. ?

## 2022-03-29 ENCOUNTER — Emergency Department (HOSPITAL_COMMUNITY): Payer: Medicare Other

## 2022-03-29 ENCOUNTER — Encounter (HOSPITAL_COMMUNITY): Payer: Self-pay | Admitting: Emergency Medicine

## 2022-03-29 ENCOUNTER — Emergency Department (HOSPITAL_COMMUNITY)
Admission: EM | Admit: 2022-03-29 | Discharge: 2022-03-29 | Disposition: A | Discharge status: Home / Self Care | Payer: Medicare Other | Attending: Emergency Medicine | Admitting: Emergency Medicine

## 2022-03-29 DIAGNOSIS — Z7982 Long term (current) use of aspirin: Secondary | ICD-10-CM | POA: Diagnosis not present

## 2022-03-29 DIAGNOSIS — S0101XA Laceration without foreign body of scalp, initial encounter: Secondary | ICD-10-CM | POA: Insufficient documentation

## 2022-03-29 DIAGNOSIS — Y9301 Activity, walking, marching and hiking: Secondary | ICD-10-CM | POA: Insufficient documentation

## 2022-03-29 DIAGNOSIS — Y9289 Other specified places as the place of occurrence of the external cause: Secondary | ICD-10-CM | POA: Insufficient documentation

## 2022-03-29 DIAGNOSIS — S0990XA Unspecified injury of head, initial encounter: Secondary | ICD-10-CM | POA: Diagnosis present

## 2022-03-29 DIAGNOSIS — Z7901 Long term (current) use of anticoagulants: Secondary | ICD-10-CM | POA: Diagnosis not present

## 2022-03-29 DIAGNOSIS — F0390 Unspecified dementia without behavioral disturbance: Secondary | ICD-10-CM | POA: Insufficient documentation

## 2022-03-29 DIAGNOSIS — W01198A Fall on same level from slipping, tripping and stumbling with subsequent striking against other object, initial encounter: Secondary | ICD-10-CM | POA: Diagnosis not present

## 2022-03-29 DIAGNOSIS — W19XXXA Unspecified fall, initial encounter: Secondary | ICD-10-CM

## 2022-03-29 LAB — CBC WITH DIFFERENTIAL/PLATELET
Abs Immature Granulocytes: 0.07 10*3/uL (ref 0.00–0.07)
Basophils Absolute: 0 10*3/uL (ref 0.0–0.1)
Basophils Relative: 1 %
Eosinophils Absolute: 0.2 10*3/uL (ref 0.0–0.5)
Eosinophils Relative: 3 %
HCT: 47.3 % (ref 39.0–52.0)
Hemoglobin: 15.1 g/dL (ref 13.0–17.0)
Immature Granulocytes: 1 %
Lymphocytes Relative: 21 %
Lymphs Abs: 1.4 10*3/uL (ref 0.7–4.0)
MCH: 30 pg (ref 26.0–34.0)
MCHC: 31.9 g/dL (ref 30.0–36.0)
MCV: 94 fL (ref 80.0–100.0)
Monocytes Absolute: 0.7 10*3/uL (ref 0.1–1.0)
Monocytes Relative: 10 %
Neutro Abs: 4.1 10*3/uL (ref 1.7–7.7)
Neutrophils Relative %: 64 %
Platelets: 245 10*3/uL (ref 150–400)
RBC: 5.03 MIL/uL (ref 4.22–5.81)
RDW: 14.9 % (ref 11.5–15.5)
WBC: 6.4 10*3/uL (ref 4.0–10.5)
nRBC: 0 % (ref 0.0–0.2)

## 2022-03-29 LAB — BASIC METABOLIC PANEL
Anion gap: 13 (ref 5–15)
BUN: 16 mg/dL (ref 8–23)
CO2: 21 mmol/L — ABNORMAL LOW (ref 22–32)
Calcium: 9.6 mg/dL (ref 8.9–10.3)
Chloride: 106 mmol/L (ref 98–111)
Creatinine, Ser: 1.14 mg/dL (ref 0.61–1.24)
GFR, Estimated: >60 mL/min (ref >60)
Glucose, Bld: 157 mg/dL — ABNORMAL HIGH (ref 70–99)
Potassium: 4.1 mmol/L (ref 3.5–5.1)
Sodium: 140 mmol/L (ref 135–145)

## 2022-03-29 NOTE — ED Notes (Signed)
Trauma Response Nurse Documentation ? ? ?Robert Fisher is a 88 y.o. male arriving to [facility] ED via EMS ? ?On warfarin daily. Trauma was activated as a Level 2 based on the following trauma criteria Elderly patients > 65 with head trauma on anti-coagulation (excluding ASA). Trauma team at the bedside on patient arrival. Patient cleared for CT by Dr. Pearline Cables. Patient to CT with team. GCS 15. ? ?Per report patient was coming through a doorway and tripped over the threshold falling backwards. He has a 2-3cm laceration to the back of his head, no loss of consciousness. Alert and oriented x4 on arrival. History of dementia from home. Takes coumadin for DVT. ? ?History  ? Past Medical History:  ?Diagnosis Date  ? Borderline diabetes   ? takes Metformin  ? Cancer [facility])   ? lung 2020 per spouse LUL  ? COPD (chronic obstructive pulmonary disease) ([place])   ? Diabetes mellitus without complication ([place])   ? DVT (deep venous thrombosis) ([place])   ? Full dentures   ? Hearing aid worn   ? B/L  ? HOH (hard of hearing)   ? Hypertension   ? Left upper lobe pulmonary nodule   ? Memory problem   ? Pneumonia   ? Schizo affective schizophrenia ([place])   ? Seizures ([place])   ?  ? Past Surgical History:  ?Procedure Laterality Date  ? cancer removal    ? legs  ? CATARACT EXTRACTION    ? COLONOSCOPY    ? FUDUCIAL PLACEMENT N/A 03/09/2019  ? Procedure: PLACEMENT OF FUDUCIAL;  Surgeon: [name], MD;  Location: [facility];  Service: Thoracic;  Laterality: N/A;  ? THROMBECTOMY    ? VIDEO BRONCHOSCOPY WITH ENDOBRONCHIAL NAVIGATION N/A 03/09/2019  ? Procedure: VIDEO BRONCHOSCOPY WITH ENDOBRONCHIAL NAVIGATION;  Surgeon: [name], MD;  Location: [facility];  Service: Thoracic;  Laterality: N/A;  ?  ? ?Initial Focused Assessment (If applicable, or please see trauma documentation): ?A&Ox4, GCS 15 ?History of dementia ?Laceration to back of head - bleeding controlled ?C-collar placed by EMS ?No other traumatic injuries identified ? ?CT's  Completed:   ?CT Head and CT C-Spine  ? ?Interventions:  ?IV, BMET/CBC ?CXR/PXR ?CT Head/Cspine ? ?Event Summary: ?Pending imaging ? ?Bedside handoff with ED RN Carlis Abbott.   ? ?Robert Fisher  ?Trauma Response RN ? ?Please call TRN at [phone] for further assistance. ?  ?

## 2022-03-29 NOTE — Progress Notes (Signed)
Chaplain responded to Fall on thinners. Pt not available.  RN will call as needed when family arrives.   ? ?Minus Liberty, Chaplain ?Pager: [phone] ? ? ? ? 03/29/22 1700  ?Clinical Encounter Type  ?Visited With Patient not available  ?Visit Type Initial;Trauma  ?Referral From Physician  ?Consult/Referral To Chaplain  ?Stress Factors  ?Patient Stress Factors Health changes  ? ? ?

## 2022-03-29 NOTE — ED Triage Notes (Signed)
[name] from home where he lives with wife, patient was walking through a doorway when he tripped and his the back of his head against the wall, breaking a hole through the drywall. No LOC, patient has history of dementia, takes warfarin. Patient is alert and in no apparent distress at this time. ?

## 2022-03-29 NOTE — Discharge Instructions (Signed)
Follow-up with your primary care physician or the 1 provided for staple removal in the next 7 to 10 days ?

## 2022-03-29 NOTE — ED Provider Notes (Signed)
?[facility] ?Provider Note ? ? ?CSN: [id number] ?Arrival date & time: 03/29/22  1700 ? ?  ? ?History ? ?Chief Complaint  ?Patient presents with  ? Fall  ? ? ?Robert Fisher is a 88 y.o. male. ? ?Patient is a 88 year old male with past medical history including dementia presenting for complaints of fall.  Patient states he tripped forward walking through the doorway today hitting the back of his head on the wall.  No LOC.  On warfarin.  Denies any pain at this time.  Duration to back of scalp. ? ?The history is provided by the patient. No language interpreter was used.  ?Fall ?Pertinent negatives include no chest pain, no abdominal pain and no shortness of breath.  ? ?  ? ?Home Medications ?Prior to Admission medications   ?Medication Sig Start Date End Date Taking? Authorizing Provider  ?Acetaminophen (TYLENOL ARTHRITIS PAIN PO) Take by mouth 2 (two) times daily.    [provider]  ?aspirin EC 81 MG tablet Take 81 mg by mouth every evening.     [provider]  ?atorvastatin (LIPITOR) 40 MG tablet Take 40 mg by mouth every evening.  09/14/15   [provider]  ?buPROPion (WELLBUTRIN) 100 MG tablet Take 100 mg by mouth 2 (two) times daily. Taking 1 tablet every morning    [provider]  ?calcium citrate-vitamin D (CITRACAL+D) 315-200 MG-UNIT tablet Take 1 tablet by mouth 2 (two) times daily.    [provider]  ?clonazePAM (KLONOPIN) 0.5 MG tablet Take 0.25 mg by mouth 2 (two) times daily.    [provider]  ?fenofibrate 160 MG tablet Take 160 mg by mouth daily.    [provider]  ?furosemide (LASIX) 20 MG tablet Take 20 mg by mouth See admin instructions. Take 1 tablet by mouth on Monday Wednesday and Friday    [provider]  ?hydrochlorothiazide (HYDRODIURIL) 12.5 MG tablet Take 12.5 mg by mouth daily.    [provider]  ?hydrocortisone cream 1 % Apply 1 application topically daily as  needed for itching (For rash).    [provider]  ?levETIRAcetam (KEPPRA) 500 MG tablet Take 1/2 tablet twice a day 12/13/21   Cameron Sprang, MD  ?linagliptin (TRADJENTA) 5 MG TABS tablet Take 5 mg by mouth every other day.    [provider]  ?metFORMIN (GLUCOPHAGE) 500 MG tablet Take 500 mg by mouth in the morning, at noon, and at bedtime.     [provider]  ?metoprolol tartrate (LOPRESSOR) 25 MG tablet Take 25 mg by mouth daily.     [provider]  ?Multiple Vitamin (MULTIVITAMIN WITH MINERALS) TABS tablet Take 1 tablet by mouth daily.    [provider]  ?OLANZapine (ZYPREXA) 10 MG tablet Take 15 mg by mouth at bedtime. 1.5 tablets at night    [provider]  ?Omega-3 Fatty Acids (FISH OIL PO) Take 1 capsule by mouth daily.    [provider]  ?Polyethyl Glycol-Propyl Glycol (SYSTANE FREE OP) Place 1 drop into both eyes in the morning and at bedtime.    [provider]  ?potassium chloride SA (KLOR-CON) 20 MEQ tablet Take 10 mEq by mouth daily. Take one tablet (10mg ) by mouth Daily    [provider]  ?Probiotic Product (PROBIOTIC PO) Take 1 tablet by mouth every evening.    [provider]  ?warfarin (COUMADIN) 5 MG tablet Take 2.5-5 mg by mouth See admin  instructions. Take 5mg  tablet by mouth on Mondays, Wednesdays, Fridays, and take 2.5mg  tablet by mouth  on Tuesday, Thursday, Saturday and Sunday    [provider]  ?   ? ?Allergies    ?Haloperidol lactate and Sildenafil   ? ?Review of Systems   ?Review of Systems  ?Constitutional:  Negative for chills and fever.  ?HENT:  Negative for ear pain and sore throat.   ?Eyes:  Negative for pain and visual disturbance.  ?Respiratory:  Negative for cough and shortness of breath.   ?Cardiovascular:  Negative for chest pain and palpitations.  ?Gastrointestinal:  Negative for abdominal pain and vomiting.  ?Genitourinary:  Negative for dysuria and hematuria.   ?Musculoskeletal:  Negative for arthralgias and back pain.  ?Skin:  Negative for color change and rash.  ?Neurological:  Negative for seizures and syncope.  ?All other systems reviewed and are negative. ? ?Physical Exam ?Updated Vital Signs ?BP (!) 150/70   Pulse 65   Temp 97.7 ?F (36.5 ?C) (Oral)   Resp 17   Ht 5\' 8"  (1.727 m)   Wt 79.8 kg   SpO2 96%   BMI 26.76 kg/m?  ?Physical Exam ?Vitals and nursing note reviewed.  ?Constitutional:   ?   General: He is not in acute distress. ?   Appearance: He is well-developed.  ?HENT:  ?   Head: Laceration present. No raccoon eyes, Battle's sign, contusion, right periorbital erythema or left periorbital erythema.  ? ?Eyes:  ?   General: Lids are normal.  ?   Conjunctiva/sclera: Conjunctivae normal.  ?   Pupils: Pupils are equal, round, and reactive to light.  ?Cardiovascular:  ?   Rate and Rhythm: Normal rate and regular rhythm.  ?   Heart sounds: No murmur heard. ?Pulmonary:  ?   Effort: Pulmonary effort is normal. No respiratory distress.  ?   Breath sounds: Normal breath sounds.  ?Abdominal:  ?   Palpations: Abdomen is soft.  ?   Tenderness: There is no abdominal tenderness.  ?Musculoskeletal:     ?   General: No swelling.  ?   Cervical back: Neck supple. No bony tenderness.  ?   Thoracic back: No bony tenderness.  ?   Lumbar back: No bony tenderness.  ?Skin: ?   General: Skin is warm and dry.  ?   Capillary Refill: Capillary refill takes less than 2 seconds.  ?Neurological:  ?   General: No focal deficit present.  ?   Mental Status: He is alert. He is disoriented.  ?   GCS: GCS eye subscore is 4. GCS verbal subscore is 5. GCS motor subscore is 6.  ?   Cranial Nerves: Cranial nerves 2-12 are intact.  ?   Sensory: Sensation is intact.  ?   Motor: Motor function is intact.  ?   Coordination: Coordination is intact.  ?Psychiatric:     ?   Mood and Affect: Mood normal.  ? ? ?ED Results / Procedures / Treatments   ?Labs ?(all labs ordered are listed, but only abnormal  results are displayed) ?Labs Reviewed  ?BASIC METABOLIC PANEL - Abnormal; Notable for the following components:  ?    Result Value  ? CO2 21 (*)   ? Glucose, Bld 157 (*)   ? All other components within normal limits  ?CBC WITH DIFFERENTIAL/PLATELET  ? ? ?EKG ?None ? ?Radiology ?DG Chest 2 View ? ?Result Date: 03/28/2022 ?CLINICAL DATA:  Chest congestion, cough, cold symptoms EXAM: CHEST - 2  VIEW COMPARISON:  CT done on 11/14/2021 and chest radiograph done on 03/09/2019 FINDINGS: Transverse diameter of heart is increased. There are 3 fiduciary markers in the left upper lung fields in the parahilar region. There is faint radiopacity adjacent to the fiduciary markers, possibly postradiation changes in the left upper lobe. Small patchy infiltrates are seen in the medial left lower lung fields. There are no signs of alveolar pulmonary edema. There is no significant pleural effusion or pneumothorax. IMPRESSION: Cardiomegaly. There are no signs of pulmonary edema. Increased markings in the medial left lower lung fields may suggest crowding of normal bronchovascular structures or atelectasis/pneumonia. There is faint 4 cm radiopacity in the left parahilar region adjacent to the fiduciary markers, possibly postradiation change. Electronically Signed   By: Elmer Picker M.D.   On: 03/28/2022 18:25  ? ?CT Head Wo Contrast ? ?Result Date: 03/29/2022 ?CLINICAL DATA:  Head trauma, tripped and fell striking wall EXAM: CT HEAD WITHOUT CONTRAST CT CERVICAL SPINE WITHOUT CONTRAST TECHNIQUE: Multidetector CT imaging of the head and cervical spine was performed following the standard protocol without intravenous contrast. Multiplanar CT image reconstructions of the cervical spine were also generated. RADIATION DOSE REDUCTION: This exam was performed according to the departmental dose-optimization program which includes automated exposure control, adjustment of the mA and/or kV according to patient size and/or use of iterative  reconstruction technique. COMPARISON:  CT head 07/30/2021 FINDINGS: CT HEAD FINDINGS Brain: Generalized atrophy. Normal ventricular morphology. No midline shift or mass effect. Small vessel chronic ischemic changes of deep ce

## 2022-03-29 NOTE — Progress Notes (Signed)
Orthopedic Tech Progress Note ?Patient Details:  ?Robert Fisher ?17-May-1934 ?[id number] ? ?Patient ID: Robert Fisher, male   DOB: November 29, 1934, 88 y.o.   MRN: [id number] ? ?Robert Fisher ?03/29/2022, 5:28 PMResponded to level 2 trauma. ? ?

## 2022-04-02 ENCOUNTER — Ambulatory Visit: Payer: Medicare Other | Admitting: Cardiovascular Disease

## 2022-04-02 ENCOUNTER — Encounter: Payer: Self-pay | Admitting: Cardiovascular Disease

## 2022-04-02 VITALS — BP 115/53 | HR 76 | Ht 68.0 in | Wt 185.0 lb

## 2022-04-02 DIAGNOSIS — I10 Essential (primary) hypertension: Secondary | ICD-10-CM | POA: Diagnosis not present

## 2022-04-02 DIAGNOSIS — R600 Localized edema: Secondary | ICD-10-CM | POA: Diagnosis not present

## 2022-04-02 DIAGNOSIS — R609 Edema, unspecified: Secondary | ICD-10-CM

## 2022-04-02 NOTE — Assessment & Plan Note (Signed)
History of hyperlipidemia on statin therapy followed by his PCP 

## 2022-04-02 NOTE — Assessment & Plan Note (Signed)
History of essential hypertension blood pressure measured today at 115/53.  He is on hydrochlorothiazide, metoprolol. ?

## 2022-04-02 NOTE — Progress Notes (Signed)
? ? ? ?04/02/2022 ?CHESKY HEYER   ?21-Jun-1934  ?[id number] ? ?Primary Physician Shon Baton, MD ?Primary Cardiologist: Lorretta Harp MD Lupe Carney, [place] ? ?HPI:  Robert Fisher is a 88 y.o. moderately overweight married Caucasian male father of 3 children, grandfather of 4 grandchildren who is accompanied by his daughter Robert Fisher who lives with the patient and his wife.  He was referred by the emergency room for evaluation of congestive heart failure.  His primary care provider is Dr. Shon Baton.  He is retired from being a Civil engineer, contracting and building bridges.  He has a long history of tobacco use having smoked over 100 pack years and stopped 6 years ago.  He has treated hypertension, diabetes and hyperlipidemia.  He has had a DVT and pulmonary embolism on warfarin oral anticoagulation Robert Fisher is never had a heart attack or stroke.  He is currently mostly wheelchair-bound but walks occasionally with a walker at home.  He is also had lung cancer.  He is recently seen in the emergency room with congestion and a question of congestive heart failure and was referred here for further evaluation. ? ? ?Current Meds  ?Medication Sig  ? Acetaminophen (TYLENOL ARTHRITIS PAIN PO) Take by mouth 2 (two) times daily.  ? aspirin EC 81 MG tablet Take 81 mg by mouth every evening.   ? atorvastatin (LIPITOR) 40 MG tablet Take 40 mg by mouth every evening.   ? buPROPion (WELLBUTRIN) 100 MG tablet Take 100 mg by mouth 2 (two) times daily. Taking 1 tablet every morning  ? calcium citrate-vitamin D (CITRACAL+D) 315-200 MG-UNIT tablet Take 1 tablet by mouth 2 (two) times daily.  ? clonazePAM (KLONOPIN) 0.5 MG tablet Take 0.25 mg by mouth 2 (two) times daily.  ? fenofibrate 160 MG tablet Take 160 mg by mouth daily.  ? furosemide (LASIX) 20 MG tablet Take 20 mg by mouth See admin instructions. Take 1 tablet by mouth on Monday Wednesday and Friday  ? hydrochlorothiazide (HYDRODIURIL) 12.5 MG tablet Take 12.5 mg by mouth daily.  ?  hydrocortisone cream 1 % Apply 1 application topically daily as needed for itching (For rash).  ? levETIRAcetam (KEPPRA) 500 MG tablet Take 1/2 tablet twice a day  ? linagliptin (TRADJENTA) 5 MG TABS tablet Take 5 mg by mouth every other day.  ? metFORMIN (GLUCOPHAGE) 500 MG tablet Take 500 mg by mouth in the morning, at noon, and at bedtime.   ? metoprolol tartrate (LOPRESSOR) 25 MG tablet Take 25 mg by mouth daily.   ? Multiple Vitamin (MULTIVITAMIN WITH MINERALS) TABS tablet Take 1 tablet by mouth daily.  ? OLANZapine (ZYPREXA) 10 MG tablet Take 15 mg by mouth at bedtime. 1.5 tablets at night  ? Omega-3 Fatty Acids (FISH OIL PO) Take 1 capsule by mouth daily.  ? Polyethyl Glycol-Propyl Glycol (SYSTANE FREE OP) Place 1 drop into both eyes in the morning and at bedtime.  ? potassium chloride SA (KLOR-CON) 20 MEQ tablet Take 10 mEq by mouth daily. Take one tablet (10mg ) by mouth Daily  ? Probiotic Product (PROBIOTIC PO) Take 1 tablet by mouth every evening.  ? warfarin (COUMADIN) 5 MG tablet Take 2.5-5 mg by mouth See admin instructions. Take 5mg  tablet by mouth on Mondays, Wednesdays, Fridays, and take 2.5mg  tablet by mouth  on Tuesday, Thursday, Saturday and Sunday  ?  ? ?Allergies  ?Allergen Reactions  ? Haloperidol Lactate Other (See Comments)  ?  "made him climb the walls."  ?  Sildenafil Other (See Comments)  ?  "pain in right thigh."  ? ? ?Social History  ? ?Socioeconomic History  ? Marital status: Married  ?  Spouse name: Not on file  ? Number of children: Not on file  ? Years of education: Not on file  ? Highest education level: Not on file  ?Occupational History  ? Not on file  ?Tobacco Use  ? Smoking status: Former  ?  Packs/day: 1.00  ?  Years: 50.00  ?  Pack years: 50.00  ?  Types: Cigarettes, Pipe  ?  Start date: 79  ?  Quit date: 03/03/2015  ?  Years since quitting: 7.0  ? Smokeless tobacco: Never  ? Tobacco comments:  ?  30 yearsv cigarettes ; pipe 20 years  ?Vaping Use  ? Vaping Use: Never used   ?Substance and Sexual Activity  ? Alcohol use: No  ? Drug use: No  ? Sexual activity: Not Currently  ?Other Topics Concern  ? Not on file  ?Social History Narrative  ? Right handed   ? Lives with wife daughter is with him right now   ? ?Social Determinants of Health  ? ?Financial Resource Strain: Not on file  ?Food Insecurity: Not on file  ?Transportation Needs: Not on file  ?Physical Activity: Not on file  ?Stress: Not on file  ?Social Connections: Not on file  ?Intimate Partner Violence: Not on file  ?  ? ?Review of Systems: ?General: negative for chills, fever, night sweats or weight changes.  ?Cardiovascular: negative for chest pain, dyspnea on exertion, edema, orthopnea, palpitations, paroxysmal nocturnal dyspnea or shortness of breath ?Dermatological: negative for rash ?Respiratory: negative for cough or wheezing ?Urologic: negative for hematuria ?Abdominal: negative for nausea, vomiting, diarrhea, bright red blood per rectum, melena, or hematemesis ?Neurologic: negative for visual changes, syncope, or dizziness ?All other systems reviewed and are otherwise negative except as noted above. ? ? ? ?Blood pressure (!) 115/53, pulse 76, height 5\' 8"  (1.727 m), weight 185 lb (83.9 kg), SpO2 92 %.  ?General appearance: alert and no distress ?Neck: no adenopathy, no carotid bruit, no JVD, supple, symmetrical, trachea midline, and thyroid not enlarged, symmetric, no tenderness/mass/nodules ?Lungs: clear to auscultation bilaterally ?Heart: regular rate and rhythm, S1, S2 normal, no murmur, click, rub or gallop ?Extremities: 1-2+ ankle edema bilaterally ?Pulses: 2+ and symmetric ?Skin: Skin color, texture, turgor normal. No rashes or lesions ?Neurologic: Grossly normal ? ?EKG not performed today ? ?ASSESSMENT AND PLAN:  ? ?HYPERLIPIDEMIA NEC/NOS ?History of hyperlipidemia on statin therapy followed by his PCP. ? ?PULMONARY EMBOLISM, HX OF ?History of pulmonary embolism and DVT in the past on Coumadin oral  anticoagulation. ? ?Essential hypertension ?History of essential hypertension blood pressure measured today at 115/53.  He is on hydrochlorothiazide, metoprolol. ? ?Lower extremity edema ?History of bilateral lower extremity edema on furosemide 4 times a week.  He does have 1+ ankle edema. ? ? ? ? ?Lorretta Harp MD FACP,FACC,FAHA, FSCAI ?04/02/2022 ?11:04 AM ?

## 2022-04-02 NOTE — Assessment & Plan Note (Signed)
History of bilateral lower extremity edema on furosemide 4 times a week.  He does have 1+ ankle edema. ?

## 2022-04-02 NOTE — Patient Instructions (Signed)
?  Testing/Procedures: ? ?Your physician has requested that you have an echocardiogram. Echocardiography is a painless test that uses sound waves to create images of your heart. It provides your doctor with information about the size and shape of your heart and how well your heart?s chambers and valves are working. This procedure takes approximately one hour. There are no restrictions for this procedure. [place]: ?At [facility], you and your health needs are our priority.  As part of our continuing mission to provide you with exceptional heart care, we have created designated Provider Care Teams.  These Care Teams include your primary Cardiologist (physician) and Advanced Practice Providers (APPs -  Physician Assistants and Nurse Practitioners) who all work together to provide you with the care you need, when you need it. ? ?We recommend signing up for the patient portal called "MyChart".  Sign up information is provided on this After Visit Summary.  MyChart is used to connect with patients for Virtual Visits (Telemedicine).  Patients are able to view lab/test results, encounter notes, upcoming appointments, etc.  Non-urgent messages can be sent to your provider as well.   ?To learn more about what you can do with MyChart, go to [email or web address].   ? ?Your next appointment:   ?3 month(s) ? ?The format for your next appointment:   ?In Person ? ?Provider:   ?APP-   Then, DR BERRY will plan to see you again in 6 month(s).  ? ? ? ? ?Important Information About Sugar ? ? ? ? ?  ?

## 2022-04-02 NOTE — Assessment & Plan Note (Signed)
History of pulmonary embolism and DVT in the past on Coumadin oral anticoagulation. ?

## 2022-04-03 ENCOUNTER — Emergency Department (HOSPITAL_BASED_OUTPATIENT_CLINIC_OR_DEPARTMENT_OTHER): Payer: Medicare Other

## 2022-04-03 ENCOUNTER — Emergency Department (HOSPITAL_BASED_OUTPATIENT_CLINIC_OR_DEPARTMENT_OTHER)
Admission: EM | Admit: 2022-04-03 | Discharge: 2022-04-03 | Disposition: A | Discharge status: Home / Self Care | Payer: Medicare Other | Attending: Emergency Medicine | Admitting: Emergency Medicine

## 2022-04-03 ENCOUNTER — Other Ambulatory Visit: Payer: Self-pay

## 2022-04-03 ENCOUNTER — Encounter (HOSPITAL_BASED_OUTPATIENT_CLINIC_OR_DEPARTMENT_OTHER): Payer: Self-pay | Admitting: Emergency Medicine

## 2022-04-03 DIAGNOSIS — Z79899 Other long term (current) drug therapy: Secondary | ICD-10-CM | POA: Diagnosis not present

## 2022-04-03 DIAGNOSIS — S0990XA Unspecified injury of head, initial encounter: Secondary | ICD-10-CM | POA: Diagnosis not present

## 2022-04-03 DIAGNOSIS — Z7901 Long term (current) use of anticoagulants: Secondary | ICD-10-CM | POA: Diagnosis not present

## 2022-04-03 DIAGNOSIS — W19XXXA Unspecified fall, initial encounter: Secondary | ICD-10-CM

## 2022-04-03 DIAGNOSIS — W01198A Fall on same level from slipping, tripping and stumbling with subsequent striking against other object, initial encounter: Secondary | ICD-10-CM | POA: Diagnosis not present

## 2022-04-03 DIAGNOSIS — Z043 Encounter for examination and observation following other accident: Secondary | ICD-10-CM | POA: Diagnosis present

## 2022-04-03 DIAGNOSIS — Z7982 Long term (current) use of aspirin: Secondary | ICD-10-CM | POA: Insufficient documentation

## 2022-04-03 LAB — CBC
HCT: 48 % (ref 39.0–52.0)
Hemoglobin: 15.2 g/dL (ref 13.0–17.0)
MCH: 29.3 pg (ref 26.0–34.0)
MCHC: 31.7 g/dL (ref 30.0–36.0)
MCV: 92.7 fL (ref 80.0–100.0)
Platelets: 293 10*3/uL (ref 150–400)
RBC: 5.18 MIL/uL (ref 4.22–5.81)
RDW: 14.8 % (ref 11.5–15.5)
WBC: 9.8 10*3/uL (ref 4.0–10.5)
nRBC: 0 % (ref 0.0–0.2)

## 2022-04-03 LAB — COMPREHENSIVE METABOLIC PANEL
ALT: 27 U/L (ref 0–44)
AST: 23 U/L (ref 15–41)
Albumin: 4.3 g/dL (ref 3.5–5.0)
Alkaline Phosphatase: 35 U/L — ABNORMAL LOW (ref 38–126)
Anion gap: 12 (ref 5–15)
BUN: 24 mg/dL — ABNORMAL HIGH (ref 8–23)
CO2: 27 mmol/L (ref 22–32)
Calcium: 10.1 mg/dL (ref 8.9–10.3)
Chloride: 101 mmol/L (ref 98–111)
Creatinine, Ser: 1.22 mg/dL (ref 0.61–1.24)
GFR, Estimated: 58 mL/min — ABNORMAL LOW (ref >60)
Glucose, Bld: 296 mg/dL — ABNORMAL HIGH (ref 70–99)
Potassium: 3.5 mmol/L (ref 3.5–5.1)
Sodium: 140 mmol/L (ref 135–145)
Total Bilirubin: 0.5 mg/dL (ref 0.3–1.2)
Total Protein: 7 g/dL (ref 6.5–8.1)

## 2022-04-03 LAB — PROTIME-INR
INR: 1.8 — ABNORMAL HIGH (ref 0.8–1.2)
Prothrombin Time: 20.4 seconds — ABNORMAL HIGH (ref 11.4–15.2)

## 2022-04-03 NOTE — ED Triage Notes (Signed)
Pt arrives to ED with c/o fall. Pt was seen on 4/28 for pneumonia and 4/29 for fall required staples in scalp. Pt reports he fell backwards while getting on his indoor scooter, slipping the n falling backwards hitting his tailbone and the posterior aspect of his head. Pt reports he is currently taking daily Warfarin. Pt does report feeling better after finishing prescribed antibiotics for pneumonia yesterday.  ?

## 2022-04-03 NOTE — Discharge Instructions (Signed)
As we discussed, your work-up in the ER today was reassuring for acute abnormalities.  I have given you a referral for home health which should give you some assistance at home.  They should be calling you in the next few days to help set this up. ? ?Return if development of any new or worsening symptoms. ?

## 2022-04-03 NOTE — ED Notes (Signed)
Pt daughter states that he has been having frequent falls. He fell again this morning. He takes blood thinners. Pt denies pain from the fall. Reports ongoing weakness and recently treated for pneumonia. Pt in soiled underwear. Pt cleaned and given a urinal. Pressure injury noticed to R buttock.  ?

## 2022-04-03 NOTE — ED Triage Notes (Incomplete)
Pt arrives to ED with c/o fall. Was seen on 4/28 for pneumonia and 4/29 for fall. Pt states that he fell backwards while getting on his scooter in his home, slipping, landing on his tailbone then hitting the posterior aspect of his head. Pt reports he is currently taking Warfarin. Pt does report that he does feel better from the pneumonia after finishing his antibiotics yesterday.  ?

## 2022-04-03 NOTE — ED Provider Notes (Signed)
?[place] EMERGENCY DEPT ?Provider Note ? ? ?CSN: [id number] ?Arrival date & time: 04/03/22  1056 ? ?  ? ?History ? ?Chief Complaint  ?Patient presents with  ? Fall  ? ? ?Robert Fisher is a 88 y.o. male. ? ?Patient with history of hyperlipidemia, schizophrenia, PE on Warfarin, presents today with complaints of fall. States that same occurred earlier today when he was transitioning from his walker to his motorized wheelchair after using the bathroom. He states that he lost his footing and fell backwards striking the back of his head on the ground. He denies any loss of consciousness, states that he is not hurting anywhere and has no complaints, however presents today out of concern given he is on Warfarin. He has been ambulatory since the fall with assistance per baseline. He lives at home with his wife and daughter and ambulates minimally throughout the house with a walker, however is mostly wheelchair bound with a motorized scooter as well. ? ?The history is provided by the patient and a relative. No language interpreter was used.  ?Fall ?Pertinent negatives include no headaches.  ? ?  ? ?Home Medications ?Prior to Admission medications   ?Medication Sig Start Date End Date Taking? Authorizing Provider  ?Acetaminophen (TYLENOL ARTHRITIS PAIN PO) Take by mouth 2 (two) times daily.    [provider]  ?aspirin EC 81 MG tablet Take 81 mg by mouth every evening.     [provider]  ?atorvastatin (LIPITOR) 40 MG tablet Take 40 mg by mouth every evening.  09/14/15   [provider]  ?buPROPion (WELLBUTRIN) 100 MG tablet Take 100 mg by mouth 2 (two) times daily. Taking 1 tablet every morning    [provider]  ?calcium citrate-vitamin D (CITRACAL+D) 315-200 MG-UNIT tablet Take 1 tablet by mouth 2 (two) times daily.    [provider]  ?cefdinir (OMNICEF) 300 MG capsule Take by mouth. 03/24/22   [provider]  ?clonazePAM (KLONOPIN) 0.5 MG tablet  Take 0.25 mg by mouth 2 (two) times daily.    [provider]  ?fenofibrate 160 MG tablet Take 160 mg by mouth daily.    [provider]  ?furosemide (LASIX) 20 MG tablet Take 20 mg by mouth See admin instructions. Take 1 tablet by mouth on Monday Wednesday and Friday    [provider]  ?hydrochlorothiazide (HYDRODIURIL) 12.5 MG tablet Take 12.5 mg by mouth daily.    [provider]  ?hydrocortisone cream 1 % Apply 1 application topically daily as needed for itching (For rash).    [provider]  ?levETIRAcetam (KEPPRA) 500 MG tablet Take 1/2 tablet twice a day 12/13/21   Cameron Sprang, MD  ?linagliptin (TRADJENTA) 5 MG TABS tablet Take 5 mg by mouth every other day.    [provider]  ?metFORMIN (GLUCOPHAGE) 500 MG tablet Take 500 mg by mouth in the morning, at noon, and at bedtime.     [provider]  ?metoprolol tartrate (LOPRESSOR) 25 MG tablet Take 25 mg by mouth daily.     [provider]  ?Multiple Vitamin (MULTIVITAMIN WITH MINERALS) TABS tablet Take 1 tablet by mouth daily.    [provider]  ?OLANZapine (ZYPREXA) 10 MG tablet Take 15 mg by mouth at bedtime. 1.5 tablets at night    [provider]  ?Omega-3 Fatty Acids (FISH OIL PO) Take 1 capsule by mouth daily.    [provider]  ?Polyethyl Glycol-Propyl Glycol (SYSTANE FREE OP) Place  1 drop into both eyes in the morning and at bedtime.    [provider]  ?potassium chloride SA (KLOR-CON) 20 MEQ tablet Take 10 mEq by mouth daily. Take one tablet (10mg ) by mouth Daily    [provider]  ?Probiotic Product (PROBIOTIC PO) Take 1 tablet by mouth every evening.    [provider]  ?warfarin (COUMADIN) 5 MG tablet Take 2.5-5 mg by mouth See admin instructions. Take 5mg  tablet by mouth on Mondays, Wednesdays, Fridays, and take 2.5mg  tablet by mouth  on Tuesday, Thursday, Saturday and Sunday    [provider]  ?    ? ?Allergies    ?Haloperidol lactate and Sildenafil   ? ?Review of Systems   ?Review of Systems  ?Neurological:  Negative for dizziness, tremors, seizures, syncope, facial asymmetry, speech difficulty, weakness, light-headedness, numbness and headaches.  ?All other systems reviewed and are negative. ? ?Physical Exam ?Updated Vital Signs ?BP (!) 147/64   Pulse 67   Temp 97.9 ?F (36.6 ?C) (Temporal)   Resp 16   SpO2 93%  ?Physical Exam ?Vitals and nursing note reviewed.  ?Constitutional:   ?   General: He is not in acute distress. ?   Appearance: Normal appearance. He is normal weight. He is not ill-appearing, toxic-appearing or diaphoretic.  ?HENT:  ?   Head: Normocephalic and atraumatic.  ?   Comments: 2 staples intact to posterior head wound 2 days prior, wound is well healing. No new wound or hematoma visualized ?Eyes:  ?   Extraocular Movements: Extraocular movements intact.  ?   Pupils: Pupils are equal, round, and reactive to light.  ?Cardiovascular:  ?   Rate and Rhythm: Normal rate and regular rhythm.  ?   Heart sounds: Normal heart sounds.  ?Pulmonary:  ?   Effort: Pulmonary effort is normal. No respiratory distress.  ?   Breath sounds: Normal breath sounds.  ?Abdominal:  ?   General: Abdomen is flat.  ?   Palpations: Abdomen is soft.  ?Musculoskeletal:     ?   General: Normal range of motion.  ?   Cervical back: Normal range of motion.  ?   Comments: No tenderness to palpation of cervical, thoracic, or lumbar spine.  No step-offs or deformities.  Patient moving all extremities equally.  ?Skin: ?   General: Skin is warm and dry.  ?Neurological:  ?   General: No focal deficit present.  ?   Mental Status: He is alert.  ?Psychiatric:     ?   Mood and Affect: Mood normal.     ?   Behavior: Behavior normal.  ? ? ?ED Results / Procedures / Treatments   ?Labs ?(all labs ordered are listed, but only abnormal results are displayed) ?Labs Reviewed  ?COMPREHENSIVE METABOLIC PANEL - Abnormal; Notable for the  following components:  ?    Result Value  ? Glucose, Bld 296 (*)   ? BUN 24 (*)   ? Alkaline Phosphatase 35 (*)   ? GFR, Estimated 58 (*)   ? All other components within normal limits  ?PROTIME-INR - Abnormal; Notable for the following components:  ? Prothrombin Time 20.4 (*)   ? INR 1.8 (*)   ? All other components within normal limits  ?CBC  ?URINALYSIS, ROUTINE W REFLEX MICROSCOPIC  ? ? ?EKG ?None ? ?Radiology ?CT HEAD WO CONTRAST ? ?Result Date: 04/03/2022 ?CLINICAL DATA:  Head trauma, minor (Age >= 65y); Facial trauma, blunt EXAM: CT HEAD WITHOUT CONTRAST CT  CERVICAL SPINE WITHOUT CONTRAST TECHNIQUE: Multidetector CT imaging of the head and cervical spine was performed following the standard protocol without intravenous contrast. Multiplanar CT image reconstructions of the cervical spine were also generated. RADIATION DOSE REDUCTION: This exam was performed according to the departmental dose-optimization program which includes automated exposure control, adjustment of the mA and/or kV according to patient size and/or use of iterative reconstruction technique. COMPARISON:  CT head and cervical spine March 29, 2022. FINDINGS: CT HEAD FINDINGS Brain: No evidence of acute infarction, hemorrhage, hydrocephalus, extra-axial collection or mass lesion/mass effect. Cerebral atrophy and chronic microvascular ischemic disease. Vascular: No hyperdense vessel identified. Calcific intracranial atherosclerosis. Skull: No acute fracture.  Left posterior scalp skin staples. Sinuses/Orbits: Mild paranasal sinus mucosal thickening. No acute orbital findings. Other: No mastoid effusions. CT CERVICAL SPINE FINDINGS Alignment: Mild retrolisthesis C5 on C6 and C6 on C7, probably degenerative given marked degenerative change at these levels. Skull base and vertebrae: No acute fracture. Vertebral body heights are maintained. Soft tissues and spinal canal: No prevertebral fluid or swelling. No visible canal hematoma. Disc levels:  Multilevel facet uncovertebral hypertrophy with varying degrees of neural foraminal stenosis, potentially severe on the left at C5-C6 and bilaterally at C6-C7. Upper chest: Left upper lobe atelectasis/scar. Otherwise, clear vis

## 2022-04-03 NOTE — ED Notes (Signed)
Pt aware of urine specimen needed.  ?

## 2022-04-04 ENCOUNTER — Ambulatory Visit: Payer: Self-pay | Admitting: *Deleted

## 2022-04-04 DIAGNOSIS — M79672 Pain in left foot: Secondary | ICD-10-CM | POA: Insufficient documentation

## 2022-04-04 NOTE — Telephone Encounter (Signed)
Been to ED 3 times in last 5 days.   His ankles are hurting.   Did they do x rays last night? ?Reason for Disposition ? Health Information question, no triage required and triager able to answer question ? ?Answer Assessment - Initial Assessment Questions ?1. REASON FOR CALL or QUESTION: "What is your reason for calling today?" or "How can I best help you?" or "What question do you have that I can help answer?" ?    Did they do x rays of his ankles last night in the ED?  [name], daughter calling in asking. ? ?Protocols used: Information Only Call - No Triage-A-AH ? ?

## 2022-04-04 NOTE — Telephone Encounter (Signed)
?  Chief Complaint: Daughter calling in on community line to see if x rays of her father's ankles were done last night in the ED.   I let her know I did not see any imaging of his ankles.   She thanked me for my help ?Symptoms:  ?Frequency:  ?Pertinent Negatives: Patient denies  ?Disposition: [] ED /[] Urgent Care (no appt availability in office) / [] Appointment(In office/virtual)/ []  [facility]/ [x] Home Care/ [] Refused Recommended Disposition /[] [facility] [facility]/ []  Follow-up with PCP ?Additional Notes:   ?

## 2022-04-10 ENCOUNTER — Telehealth: Payer: Self-pay

## 2022-04-10 NOTE — Telephone Encounter (Signed)
Spoke with [name] and scheduled a Mychart Palliative Consult for 04/21/22 @ 2 PM.  ? ?Consent obtained; updated Netsmart, Team List and Epic.  ? ?

## 2022-04-17 ENCOUNTER — Ambulatory Visit (HOSPITAL_COMMUNITY): Payer: Medicare Other | Attending: Cardiovascular Disease

## 2022-04-17 DIAGNOSIS — R609 Edema, unspecified: Secondary | ICD-10-CM | POA: Diagnosis present

## 2022-04-17 DIAGNOSIS — E785 Hyperlipidemia, unspecified: Secondary | ICD-10-CM | POA: Diagnosis not present

## 2022-04-17 DIAGNOSIS — I119 Hypertensive heart disease without heart failure: Secondary | ICD-10-CM | POA: Insufficient documentation

## 2022-04-17 LAB — ECHOCARDIOGRAM COMPLETE
Area-P 1/2: 3.58 cm2
S' Lateral: 2.5 cm

## 2022-04-21 ENCOUNTER — Telehealth: Payer: Medicare Other | Admitting: Internal Medicine

## 2022-04-21 VITALS — Ht 68.0 in | Wt 185.0 lb

## 2022-04-21 DIAGNOSIS — Z86718 Personal history of other venous thrombosis and embolism: Secondary | ICD-10-CM

## 2022-04-21 DIAGNOSIS — Z515 Encounter for palliative care: Secondary | ICD-10-CM

## 2022-04-21 DIAGNOSIS — C3412 Malignant neoplasm of upper lobe, left bronchus or lung: Secondary | ICD-10-CM

## 2022-04-21 DIAGNOSIS — M4802 Spinal stenosis, cervical region: Secondary | ICD-10-CM

## 2022-04-21 DIAGNOSIS — F200 Paranoid schizophrenia: Secondary | ICD-10-CM
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
## 2022-04-23 ENCOUNTER — Encounter: Payer: Self-pay | Admitting: Podiatry

## 2022-04-23 ENCOUNTER — Ambulatory Visit: Payer: Medicare Other | Admitting: Podiatry

## 2022-04-23 DIAGNOSIS — B351 Tinea unguium: Secondary | ICD-10-CM | POA: Diagnosis not present

## 2022-04-23 DIAGNOSIS — M79674 Pain in right toe(s): Secondary | ICD-10-CM

## 2022-04-23 DIAGNOSIS — I739 Peripheral vascular disease, unspecified: Secondary | ICD-10-CM | POA: Diagnosis not present

## 2022-04-23 DIAGNOSIS — M2031 Hallux varus (acquired), right foot: Secondary | ICD-10-CM | POA: Diagnosis not present

## 2022-04-23 DIAGNOSIS — M79675 Pain in left toe(s): Secondary | ICD-10-CM

## 2022-04-23 NOTE — Progress Notes (Signed)
This patient returns to my office for at risk foot care.  This patient requires this care by a professional since this patient will be at risk due to having peripheral vascular disease and coagulation defect.  Patient is taking coumadin.  This patient is unable to cut nails himself since the patient cannot reach his nails.These nails are painful walking and wearing shoes.  This patient presents for at risk foot care today.  General Appearance  Alert, conversant and in no acute stress.  Vascular  Dorsalis pedis and posterior tibial  pulses are weakly  palpable  bilaterally.  Capillary return is within normal limits  bilaterally. Temperature is within normal limits  Bilaterally. Absent digital hair.  Neurologic  Senn-Weinstein monofilament wire test within normal limits  bilaterally. Muscle power within normal limits bilaterally.  Nails Thick disfigured discolored nails with subungual debris  from hallux to fifth toes bilaterally. No evidence of bacterial infection or drainage bilaterally.  Orthopedic  No limitations of motion  feet .  No crepitus or effusions noted.  No bony pathology or digital deformities noted.  Hallux malleus right foot.  Skin  normotropic skin with no porokeratosis noted bilaterally.  No signs of infections or ulcers noted.     Onychomycosis  Pain in right toes  Pain in left toes  Hallux malleus.  Consent was obtained for treatment procedures.   Mechanical debridement of nails 1-5  bilaterally performed with a nail nipper.  Filed with dremel without incident. Discussed hallux with this patient and padding was dispensed.   Return office visit   3 months                  Told patient to return for periodic foot care and evaluation due to potential at risk complications.   Gardiner Barefoot DPM

## 2022-04-28 ENCOUNTER — Encounter: Payer: Self-pay | Admitting: Internal Medicine

## 2022-04-29 ENCOUNTER — Telehealth: Payer: Self-pay | Admitting: *Deleted

## 2022-04-29 NOTE — Telephone Encounter (Signed)
RETURNED  PATIENT'S DAUGHTER"S  PHONE [place], SPOKE [place]

## 2022-04-30 ENCOUNTER — Telehealth: Payer: Self-pay | Admitting: *Deleted

## 2022-04-30 NOTE — Telephone Encounter (Signed)
RETURNED PATIENT'S DAUGHTER'S PHONE CALL ([place]), SPOKE [place]

## 2022-05-01 ENCOUNTER — Telehealth: Payer: Self-pay | Admitting: *Deleted

## 2022-05-01 NOTE — Telephone Encounter (Signed)
CALLED PATIENT'S DAUGHTER- MEREDITH JONES AND INFORMED OF CT FOR 05-19-22, ARRIVAL TIME- 1 PM , PATIENT TO BE NPO- 4 HRS. PRIOR TO TEST, PATIENT TO PICK-UP CONTRAST BEFORE SCAN AND PATIENT TO RECEIVE RESULTS FROM ALISON PERKINS ON 05-20-22 @ 8:30 AM  VIA TELEPHONE , SPOKE WITH PATIENT'S DAUGHTER, MEREDITH JONES AND SHE IS AWARE OF THESE APPTS. AND THE INSTRUCTIONS

## 2022-05-19 ENCOUNTER — Other Ambulatory Visit: Payer: Self-pay

## 2022-05-19 ENCOUNTER — Encounter (HOSPITAL_COMMUNITY): Payer: Self-pay

## 2022-05-19 ENCOUNTER — Emergency Department (HOSPITAL_COMMUNITY): Payer: Medicare Other

## 2022-05-19 ENCOUNTER — Ambulatory Visit (HOSPITAL_COMMUNITY): Payer: Medicare Other

## 2022-05-19 ENCOUNTER — Inpatient Hospital Stay (HOSPITAL_COMMUNITY)
Admission: EM | Admit: 2022-05-19 | Discharge: 2022-05-27 | DRG: 445 | Disposition: A | Discharge status: Home Health | Payer: Medicare Other | Attending: Internal Medicine | Admitting: Internal Medicine

## 2022-05-19 DIAGNOSIS — Z7901 Long term (current) use of anticoagulants: Secondary | ICD-10-CM

## 2022-05-19 DIAGNOSIS — E860 Dehydration: Secondary | ICD-10-CM | POA: Diagnosis present

## 2022-05-19 DIAGNOSIS — R600 Localized edema: Secondary | ICD-10-CM

## 2022-05-19 DIAGNOSIS — F200 Paranoid schizophrenia: Secondary | ICD-10-CM | POA: Diagnosis present

## 2022-05-19 DIAGNOSIS — Z7985 Long-term (current) use of injectable non-insulin antidiabetic drugs: Secondary | ICD-10-CM

## 2022-05-19 DIAGNOSIS — Z7984 Long term (current) use of oral hypoglycemic drugs: Secondary | ICD-10-CM | POA: Diagnosis not present

## 2022-05-19 DIAGNOSIS — G40909 Epilepsy, unspecified, not intractable, without status epilepticus: Secondary | ICD-10-CM | POA: Diagnosis present

## 2022-05-19 DIAGNOSIS — I5032 Chronic diastolic (congestive) heart failure: Secondary | ICD-10-CM | POA: Diagnosis present

## 2022-05-19 DIAGNOSIS — K5900 Constipation, unspecified: Secondary | ICD-10-CM | POA: Diagnosis present

## 2022-05-19 DIAGNOSIS — E1165 Type 2 diabetes mellitus with hyperglycemia: Secondary | ICD-10-CM | POA: Diagnosis present

## 2022-05-19 DIAGNOSIS — Z79899 Other long term (current) drug therapy: Secondary | ICD-10-CM

## 2022-05-19 DIAGNOSIS — R7989 Other specified abnormal findings of blood chemistry: Principal | ICD-10-CM

## 2022-05-19 DIAGNOSIS — L89311 Pressure ulcer of right buttock, stage 1: Secondary | ICD-10-CM | POA: Diagnosis present

## 2022-05-19 DIAGNOSIS — N182 Chronic kidney disease, stage 2 (mild): Secondary | ICD-10-CM | POA: Diagnosis present

## 2022-05-19 DIAGNOSIS — J449 Chronic obstructive pulmonary disease, unspecified: Secondary | ICD-10-CM | POA: Diagnosis present

## 2022-05-19 DIAGNOSIS — K810 Acute cholecystitis: Secondary | ICD-10-CM | POA: Diagnosis present

## 2022-05-19 DIAGNOSIS — W050XXA Fall from non-moving wheelchair, initial encounter: Secondary | ICD-10-CM | POA: Diagnosis present

## 2022-05-19 DIAGNOSIS — W19XXXA Unspecified fall, initial encounter: Secondary | ICD-10-CM

## 2022-05-19 DIAGNOSIS — E785 Hyperlipidemia, unspecified: Secondary | ICD-10-CM | POA: Diagnosis present

## 2022-05-19 DIAGNOSIS — R54 Age-related physical debility: Secondary | ICD-10-CM | POA: Diagnosis present

## 2022-05-19 DIAGNOSIS — L24A0 Irritant contact dermatitis due to friction or contact with body fluids, unspecified: Secondary | ICD-10-CM | POA: Diagnosis present

## 2022-05-19 DIAGNOSIS — K8001 Calculus of gallbladder with acute cholecystitis with obstruction: Secondary | ICD-10-CM | POA: Diagnosis present

## 2022-05-19 DIAGNOSIS — R791 Abnormal coagulation profile: Secondary | ICD-10-CM | POA: Diagnosis present

## 2022-05-19 DIAGNOSIS — R569 Unspecified convulsions: Secondary | ICD-10-CM

## 2022-05-19 DIAGNOSIS — I10 Essential (primary) hypertension: Secondary | ICD-10-CM | POA: Diagnosis present

## 2022-05-19 DIAGNOSIS — Z9181 History of falling: Secondary | ICD-10-CM

## 2022-05-19 DIAGNOSIS — E119 Type 2 diabetes mellitus without complications: Secondary | ICD-10-CM

## 2022-05-19 DIAGNOSIS — R269 Unspecified abnormalities of gait and mobility: Secondary | ICD-10-CM | POA: Diagnosis present

## 2022-05-19 DIAGNOSIS — R7401 Elevation of levels of liver transaminase levels: Secondary | ICD-10-CM | POA: Diagnosis present

## 2022-05-19 DIAGNOSIS — Z87891 Personal history of nicotine dependence: Secondary | ICD-10-CM

## 2022-05-19 DIAGNOSIS — I130 Hypertensive heart and chronic kidney disease with heart failure and stage 1 through stage 4 chronic kidney disease, or unspecified chronic kidney disease: Secondary | ICD-10-CM | POA: Diagnosis present

## 2022-05-19 DIAGNOSIS — Z7982 Long term (current) use of aspirin: Secondary | ICD-10-CM | POA: Diagnosis not present

## 2022-05-19 DIAGNOSIS — Z888 Allergy status to other drugs, medicaments and biological substances status: Secondary | ICD-10-CM

## 2022-05-19 DIAGNOSIS — Z86711 Personal history of pulmonary embolism: Secondary | ICD-10-CM

## 2022-05-19 DIAGNOSIS — E1122 Type 2 diabetes mellitus with diabetic chronic kidney disease: Secondary | ICD-10-CM | POA: Diagnosis present

## 2022-05-19 DIAGNOSIS — Z85118 Personal history of other malignant neoplasm of bronchus and lung: Secondary | ICD-10-CM

## 2022-05-19 DIAGNOSIS — H9193 Unspecified hearing loss, bilateral: Secondary | ICD-10-CM | POA: Diagnosis present

## 2022-05-19 DIAGNOSIS — Z974 Presence of external hearing-aid: Secondary | ICD-10-CM

## 2022-05-19 DIAGNOSIS — R296 Repeated falls: Secondary | ICD-10-CM | POA: Diagnosis present

## 2022-05-19 DIAGNOSIS — Z86718 Personal history of other venous thrombosis and embolism: Secondary | ICD-10-CM

## 2022-05-19 DIAGNOSIS — E876 Hypokalemia: Secondary | ICD-10-CM | POA: Diagnosis not present

## 2022-05-19 LAB — CBC WITH DIFFERENTIAL/PLATELET
Abs Immature Granulocytes: 0.04 10*3/uL (ref 0.00–0.07)
Basophils Absolute: 0 10*3/uL (ref 0.0–0.1)
Basophils Relative: 0 %
Eosinophils Absolute: 0.1 10*3/uL (ref 0.0–0.5)
Eosinophils Relative: 1 %
HCT: 45.8 % (ref 39.0–52.0)
Hemoglobin: 14.8 g/dL (ref 13.0–17.0)
Immature Granulocytes: 0 %
Lymphocytes Relative: 12 %
Lymphs Abs: 1.2 10*3/uL (ref 0.7–4.0)
MCH: 30.5 pg (ref 26.0–34.0)
MCHC: 32.3 g/dL (ref 30.0–36.0)
MCV: 94.4 fL (ref 80.0–100.0)
Monocytes Absolute: 1.2 10*3/uL — ABNORMAL HIGH (ref 0.1–1.0)
Monocytes Relative: 12 %
Neutro Abs: 7.4 10*3/uL (ref 1.7–7.7)
Neutrophils Relative %: 75 %
Platelets: 259 10*3/uL (ref 150–400)
RBC: 4.85 MIL/uL (ref 4.22–5.81)
RDW: 14.9 % (ref 11.5–15.5)
WBC: 9.9 10*3/uL (ref 4.0–10.5)
nRBC: 0 % (ref 0.0–0.2)

## 2022-05-19 LAB — URINALYSIS, ROUTINE W REFLEX MICROSCOPIC
Bacteria, UA: NONE SEEN
Bilirubin Urine: NEGATIVE
Glucose, UA: NEGATIVE mg/dL
Hgb urine dipstick: NEGATIVE
Ketones, ur: NEGATIVE mg/dL
Leukocytes,Ua: NEGATIVE
Nitrite: NEGATIVE
Protein, ur: 30 mg/dL — AB
Specific Gravity, Urine: 1.018 (ref 1.005–1.030)
pH: 5 (ref 5.0–8.0)

## 2022-05-19 LAB — COMPREHENSIVE METABOLIC PANEL
ALT: 151 U/L — ABNORMAL HIGH (ref 0–44)
AST: 166 U/L — ABNORMAL HIGH (ref 15–41)
Albumin: 2.9 g/dL — ABNORMAL LOW (ref 3.5–5.0)
Alkaline Phosphatase: 57 U/L (ref 38–126)
Anion gap: 11 (ref 5–15)
BUN: 17 mg/dL (ref 8–23)
CO2: 26 mmol/L (ref 22–32)
Calcium: 9.6 mg/dL (ref 8.9–10.3)
Chloride: 102 mmol/L (ref 98–111)
Creatinine, Ser: 1.36 mg/dL — ABNORMAL HIGH (ref 0.61–1.24)
GFR, Estimated: 51 mL/min — ABNORMAL LOW (ref >60)
Glucose, Bld: 179 mg/dL — ABNORMAL HIGH (ref 70–99)
Potassium: 3.6 mmol/L (ref 3.5–5.1)
Sodium: 139 mmol/L (ref 135–145)
Total Bilirubin: 0.7 mg/dL (ref 0.3–1.2)
Total Protein: 6.6 g/dL (ref 6.5–8.1)

## 2022-05-19 LAB — PROTIME-INR
INR: 5.5 (ref 0.8–1.2)
Prothrombin Time: 49.3 seconds — ABNORMAL HIGH (ref 11.4–15.2)

## 2022-05-19 LAB — CK: Total CK: 137 U/L (ref 49–397)

## 2022-05-19 LAB — CBG MONITORING, ED: Glucose-Capillary: 180 mg/dL — ABNORMAL HIGH (ref 70–99)

## 2022-05-19 LAB — LIPASE, BLOOD: Lipase: 29 U/L (ref 11–51)

## 2022-05-19 LAB — BRAIN NATRIURETIC PEPTIDE: B Natriuretic Peptide: 134.3 pg/mL — ABNORMAL HIGH (ref 0.0–100.0)

## 2022-05-19 MED ORDER — OXYCODONE HCL 5 MG PO TABS
5.0000 mg | ORAL_TABLET | Freq: Four times a day (QID) | ORAL | Status: DC | PRN
  Administered 2022-05-23 – 2022-05-25 (×2): 5 mg via ORAL
  Filled 2022-05-19 (×2): qty 1

## 2022-05-19 MED ORDER — CLONAZEPAM 0.25 MG PO TBDP
0.2500 mg | ORAL_TABLET | Freq: Two times a day (BID) | ORAL | Status: DC
  Administered 2022-05-19 – 2022-05-27 (×16): 0.25 mg via ORAL
  Filled 2022-05-19: qty 2
  Filled 2022-05-19 (×3): qty 1
  Filled 2022-05-19: qty 2
  Filled 2022-05-19 (×11): qty 1

## 2022-05-19 MED ORDER — LORAZEPAM 2 MG/ML IJ SOLN: 2.0000 mg | Freq: Four times a day (QID) | INTRAMUSCULAR | Status: DC | PRN

## 2022-05-19 MED ORDER — WARFARIN - PHARMACIST DOSING INPATIENT: Freq: Every day | Status: DC

## 2022-05-19 MED ORDER — SENNOSIDES-DOCUSATE SODIUM 8.6-50 MG PO TABS
1.0000 | ORAL_TABLET | Freq: Every day | ORAL | Status: DC
Start: 2022-05-19 — End: 2022-05-23
  Administered 2022-05-19 – 2022-05-22 (×4): 1 via ORAL
  Filled 2022-05-19 (×4): qty 1

## 2022-05-19 MED ORDER — PIPERACILLIN-TAZOBACTAM 3.375 G IVPB 30 MIN
3.3750 g | Freq: Once | INTRAVENOUS | Status: AC
  Administered 2022-05-19: 3.375 g via INTRAVENOUS
  Filled 2022-05-19: qty 50

## 2022-05-19 MED ORDER — HYDROMORPHONE HCL 1 MG/ML IJ SOLN: 0.5000 mg | INTRAMUSCULAR | Status: DC | PRN

## 2022-05-19 MED ORDER — LEVETIRACETAM 250 MG PO TABS
250.0000 mg | ORAL_TABLET | Freq: Two times a day (BID) | ORAL | Status: DC
  Administered 2022-05-19 – 2022-05-26 (×13): 250 mg via ORAL
  Filled 2022-05-19 (×14): qty 1

## 2022-05-19 MED ORDER — PIPERACILLIN-TAZOBACTAM 3.375 G IVPB
3.3750 g | Freq: Three times a day (TID) | INTRAVENOUS | Status: DC
Start: 2022-05-19 — End: 2022-05-25
  Administered 2022-05-20 – 2022-05-25 (×16): 3.375 g via INTRAVENOUS
  Filled 2022-05-19 (×17): qty 50

## 2022-05-19 MED ORDER — OLANZAPINE 5 MG PO TABS
20.0000 mg | ORAL_TABLET | Freq: Every day | ORAL | Status: DC
  Administered 2022-05-19 – 2022-05-26 (×8): 20 mg via ORAL
  Filled 2022-05-19 (×2): qty 4
  Filled 2022-05-19: qty 2
  Filled 2022-05-19 (×5): qty 4
  Filled 2022-05-19: qty 2

## 2022-05-19 MED ORDER — MELATONIN 5 MG PO TABS
5.0000 mg | ORAL_TABLET | Freq: Every evening | ORAL | Status: DC | PRN
  Administered 2022-05-23: 5 mg via ORAL
  Filled 2022-05-19: qty 1

## 2022-05-19 MED ORDER — POLYETHYLENE GLYCOL 3350 17 G PO PACK
17.0000 g | PACK | Freq: Every day | ORAL | Status: DC | PRN
  Administered 2022-05-27: 17 g via ORAL

## 2022-05-19 MED ORDER — INSULIN ASPART 100 UNIT/ML IJ SOLN
0.0000 [IU] | Freq: Three times a day (TID) | INTRAMUSCULAR | Status: DC
  Administered 2022-05-20 – 2022-05-21 (×5): 2 [IU] via SUBCUTANEOUS
  Administered 2022-05-22: 5 [IU] via SUBCUTANEOUS
  Administered 2022-05-22: 1 [IU] via SUBCUTANEOUS
  Administered 2022-05-23: 3 [IU] via SUBCUTANEOUS
  Administered 2022-05-24: 1 [IU] via SUBCUTANEOUS
  Administered 2022-05-24: 3 [IU] via SUBCUTANEOUS
  Administered 2022-05-24 – 2022-05-25 (×2): 2 [IU] via SUBCUTANEOUS
  Administered 2022-05-25: 3 [IU] via SUBCUTANEOUS
  Administered 2022-05-26: 5 [IU] via SUBCUTANEOUS
  Administered 2022-05-26 – 2022-05-27 (×4): 2 [IU] via SUBCUTANEOUS

## 2022-05-19 MED ORDER — INSULIN ASPART 100 UNIT/ML IJ SOLN
0.0000 [IU] | Freq: Every day | INTRAMUSCULAR | Status: DC
  Administered 2022-05-22 – 2022-05-25 (×3): 2 [IU] via SUBCUTANEOUS

## 2022-05-19 MED ORDER — ACETAMINOPHEN 325 MG PO TABS
650.0000 mg | ORAL_TABLET | Freq: Four times a day (QID) | ORAL | Status: DC | PRN
  Administered 2022-05-24: 650 mg via ORAL
  Filled 2022-05-19: qty 2

## 2022-05-19 MED ORDER — IOHEXOL 300 MG/ML  SOLN
100.0000 mL | Freq: Once | INTRAMUSCULAR | Status: AC | PRN
  Administered 2022-05-19: 100 mL via INTRAVENOUS

## 2022-05-19 NOTE — ED Triage Notes (Signed)
Patient here for leg swelling. Patient on blood thinners.  Patient reports he fell out of bed but denies hitting his head  Was evaluated by EMS at [facility].  Daughter is concerned for leg swelling.

## 2022-05-19 NOTE — Consult Note (Signed)
XZAYVION VAETH Feb 04, 1934  [id number].    Requesting MD: Dr. Jodi Mourning Chief Complaint/Reason for Consult: cholecystitis  HPI:  Mr. Batch is an 88 yo male who presented to the ED this evening with complaints of leg swelling for the last few days. On further questioning he also endorsed pain on the right side of his abdomen, as well as constipation for the last week.  At the time of my exam the patient was mildly confused and not able to provide an accurate history, however his daughter was present at bedside and was able to provide a history.  She reports that the patient lives at home with his wife and usually uses a walker to ambulate.  He has not endorsed any abdominal pain at home in the last few days.  In the ED, labs are significant for mild elevation in transaminases and an INR of 5.  Bilirubin and alk phos are normal.  The patient is on Coumadin at home for history of DVT.  His daughter reports he was previously having issues with supratherapeutic INR a few months ago.  CT scan in the ED was consistent with cholecystitis.  General surgery was consulted.  ROS: Review of Systems  Constitutional:  Negative for chills and fever.  Cardiovascular:  Positive for leg swelling.  Gastrointestinal:  Positive for abdominal pain and constipation. Negative for nausea and vomiting.    History reviewed. No pertinent family history.  Past Medical History:  Diagnosis Date   Borderline diabetes    takes Metformin   Cancer (HCC)    lung 2020 per spouse LUL   COPD (chronic obstructive pulmonary disease) (HCC)    Diabetes mellitus without complication (HCC)    DVT (deep venous thrombosis) (HCC)    Full dentures    Hearing aid worn    B/L   HOH (hard of hearing)    Hypertension    Left upper lobe pulmonary nodule    Memory problem    Pneumonia    Schizo affective schizophrenia (HCC)    Seizures (HCC)     Past Surgical History:  Procedure Laterality Date   cancer removal     legs    CATARACT EXTRACTION     COLONOSCOPY     FUDUCIAL PLACEMENT N/A 03/09/2019   Procedure: PLACEMENT OF FUDUCIAL;  Surgeon: Loreli Slot, MD;  Location: [facility] OR;  Service: Thoracic;  Laterality: N/A;   THROMBECTOMY     VIDEO BRONCHOSCOPY WITH ENDOBRONCHIAL NAVIGATION N/A 03/09/2019   Procedure: VIDEO BRONCHOSCOPY WITH ENDOBRONCHIAL NAVIGATION;  Surgeon: Loreli Slot, MD;  Location: [facility] OR;  Service: Thoracic;  Laterality: N/A;    Social History:  reports that he quit smoking about 7 years ago. His smoking use included cigarettes and pipe. He started smoking about 69 years ago. He has a 50.00 pack-year smoking history. He has never used smokeless tobacco. He reports that he does not drink alcohol and does not use drugs.  Allergies:  Allergies  Allergen Reactions   Haloperidol Lactate Other (See Comments)    "Made him climb the walls."   Sildenafil Other (See Comments)    "pain in right thigh."    (Not in a hospital admission)    Physical Exam: Blood pressure 124/73, pulse 80, temperature 97.9 F (36.6 C), temperature source Oral, resp. rate 17, height 5\' 8"  (1.727 m), weight 83.9 kg, SpO2 96 %. General: resting comfortably, appears stated age, no apparent distress Neurological: alert and oriented to person but not place HEENT: normocephalic,  atraumatic, oropharynx clear, no scleral icterus CV: regular rate and rhythm, extremities warm and well-perfused Respiratory: normal work of breathing, symmetric chest wall expansion Abdomen: soft, nondistended, focally tender to palpation in the right upper quadrant. Extremities: warm and well-perfused, no deformities, moving all extremities spontaneously Psychiatric: normal mood and affect Skin: warm and dry, no jaundice, no rashes or lesions   Results for orders placed or performed during the hospital encounter of 05/19/22 (from the past 48 hour(s))  Comprehensive metabolic panel     Status: Abnormal   Collection Time:  05/19/22  1:43 PM  Result Value Ref Range   Sodium 139 135 - 145 mmol/L   Potassium 3.6 3.5 - 5.1 mmol/L   Chloride 102 98 - 111 mmol/L   CO2 26 22 - 32 mmol/L   Glucose, Bld 179 (H) 70 - 99 mg/dL    Comment: Glucose reference range applies only to samples taken after fasting for at least 8 hours.   BUN 17 8 - 23 mg/dL   Creatinine, Ser 1.36 (H) 0.61 - 1.24 mg/dL   Calcium 9.6 8.9 - 10.3 mg/dL   Total Protein 6.6 6.5 - 8.1 g/dL   Albumin 2.9 (L) 3.5 - 5.0 g/dL   AST 166 (H) 15 - 41 U/L   ALT 151 (H) 0 - 44 U/L   Alkaline Phosphatase 57 38 - 126 U/L   Total Bilirubin 0.7 0.3 - 1.2 mg/dL   GFR, Estimated 51 (L) >60 mL/min    Comment: (NOTE) Calculated using the CKD-EPI Creatinine Equation (2021)    Anion gap 11 5 - 15    Comment: Performed at [facility] [address], [place], [place] [address]  CBC with Differential     Status: Abnormal   Collection Time: 05/19/22  1:43 PM  Result Value Ref Range   WBC 9.9 4.0 - 10.5 K/uL   RBC 4.85 4.22 - 5.81 MIL/uL   Hemoglobin 14.8 13.0 - 17.0 g/dL   HCT 45.8 39.0 - 52.0 %   MCV 94.4 80.0 - 100.0 fL   MCH 30.5 26.0 - 34.0 pg   MCHC 32.3 30.0 - 36.0 g/dL   RDW 14.9 11.5 - 15.5 %   Platelets 259 150 - 400 K/uL   nRBC 0.0 0.0 - 0.2 %   Neutrophils Relative % 75 %   Neutro Abs 7.4 1.7 - 7.7 K/uL   Lymphocytes Relative 12 %   Lymphs Abs 1.2 0.7 - 4.0 K/uL   Monocytes Relative 12 %   Monocytes Absolute 1.2 (H) 0.1 - 1.0 K/uL   Eosinophils Relative 1 %   Eosinophils Absolute 0.1 0.0 - 0.5 K/uL   Basophils Relative 0 %   Basophils Absolute 0.0 0.0 - 0.1 K/uL   Immature Granulocytes 0 %   Abs Immature Granulocytes 0.04 0.00 - 0.07 K/uL    Comment: Performed at [facility] [address], [place], [place] [address]  Lipase, blood     Status: None   Collection Time: 05/19/22  1:43 PM  Result Value Ref Range   Lipase 29 11 - 51 U/L    Comment: Performed at [facility] [address], [place], [place] [address]   Protime-INR     Status: Abnormal   Collection Time: 05/19/22  1:43 PM  Result Value Ref Range   Prothrombin Time 49.3 (H) 11.4 - 15.2 seconds   INR 5.5 (HH) 0.8 - 1.2    Comment: REPEATED TO VERIFY CRITICAL RESULT  CALLED TO, READ BACK BY AND VERIFIED WITH: T.GLOSSON RN [id number] 05/19/22 MCCORMICK K (NOTE) INR goal varies based on device and disease states. Performed at [facility], [place] [address] [place], [place] [address]   Brain natriuretic peptide     Status: Abnormal   Collection Time: 05/19/22  1:43 PM  Result Value Ref Range   B Natriuretic Peptide 134.3 (H) 0.0 - 100.0 pg/mL    Comment: Performed at [facility] [address], [place], [place] [address]  CK     Status: None   Collection Time: 05/19/22  1:43 PM  Result Value Ref Range   Total CK 137 49 - 397 U/L    Comment: Performed at [facility], [place] [address], [place], [place] [address]  Urinalysis, Routine w reflex microscopic Urine, Clean Catch     Status: Abnormal   Collection Time: 05/19/22  6:44 PM  Result Value Ref Range   Color, Urine YELLOW YELLOW   APPearance HAZY (A) CLEAR   Specific Gravity, Urine 1.018 1.005 - 1.030   pH 5.0 5.0 - 8.0   Glucose, UA NEGATIVE NEGATIVE mg/dL   Hgb urine dipstick NEGATIVE NEGATIVE   Bilirubin Urine NEGATIVE NEGATIVE   Ketones, ur NEGATIVE NEGATIVE mg/dL   Protein, ur 30 (A) NEGATIVE mg/dL   Nitrite NEGATIVE NEGATIVE   Leukocytes,Ua NEGATIVE NEGATIVE   RBC / HPF 0-5 0 - 5 RBC/hpf   WBC, UA 0-5 0 - 5 WBC/hpf   Bacteria, UA NONE SEEN NONE SEEN   Squamous Epithelial / LPF 0-5 0 - 5   Mucus PRESENT    Hyaline Casts, UA PRESENT     Comment: Performed at [facility], [place] [address], [place], [place] [address]   CT CHEST ABDOMEN PELVIS W CONTRAST  Result Date: 05/19/2022 CLINICAL DATA:  RIGHT lower quadrant pain EXAM: CT CHEST, ABDOMEN, AND PELVIS WITH CONTRAST TECHNIQUE: Multidetector CT imaging of the chest, abdomen and pelvis was performed  following the standard protocol during bolus administration of intravenous contrast. RADIATION DOSE REDUCTION: This exam was performed according to the departmental dose-optimization program which includes automated exposure control, adjustment of the mA and/or kV according to patient size and/or use of iterative reconstruction technique. CONTRAST:  137mL OMNIPAQUE IOHEXOL 300 MG/ML  SOLN COMPARISON:  Chest CT 11/14/2021 FINDINGS: CT CHEST FINDINGS Cardiovascular: No significant vascular findings. Normal heart size. No pericardial effusion. Mediastinum/Nodes: No axillary or supraclavicular adenopathy. No mediastinal or hilar adenopathy. No pericardial fluid. Esophagus normal. Lungs/Pleura: Angular band of dense atelectasis in the LEFT upper lobe is not changed. No suspicious pulmonary nodules. Musculoskeletal: No aggressive osseous lesion. CT ABDOMEN AND PELVIS FINDINGS Hepatobiliary: Liver is normal. The gallbladder is distended to 5.3 cm. Gallbladder wall is thickened and there is inflammatory stranding surrounding the gallbladder. There are several dense gallstones towards the neck of the gallbladder. Common bile duct normal caliber. No intrahepatic duct dilatation. Pancreas: No pancreatic inflammation.  Pancreatic duct normal. Spleen: Normal spleen Adrenals/urinary tract: Adrenal glands are normal. Kidneys, ureters and bladder normal. Stomach/Bowel: Stomach, small bowel, appendix, and cecum are normal. The colon and rectosigmoid colon are normal. Vascular/Lymphatic: Abdominal aorta is normal caliber. There is no retroperitoneal or periportal lymphadenopathy. No pelvic lymphadenopathy. Reproductive: Prostate unremarkable Other: Fall amount of fluid along the RIGHT pericolic gutter related to the presumed gallbladder inflammation. Musculoskeletal: No aggressive osseous lesion. IMPRESSION: Chest Impression: 1. No acute findings in chest. 2. Stable consolidation /  atelectasis in the LEFT upper  lobe. Abdomen /  Pelvis Impression: 1. CT findings consistent with acute cholecystitis. 2. No bowel obstruction or ureteral obstruction. Electronically Signed   By: Suzy Bouchard M.D.   On: 05/19/2022 18:43   DG Foot 2 Views Left  Result Date: 05/19/2022 CLINICAL DATA:  Bilateral foot pain for 1 day after falling out of bed. EXAM: LEFT FOOT - 2 VIEW COMPARISON:  None Available. FINDINGS: There is no evidence of fracture or dislocation. There is no evidence of arthropathy or other focal bone abnormality. There is dorsal soft tissue swelling overlying the metacarpal bones. IMPRESSION: 1. No acute bone abnormality. 2. Dorsal soft tissue swelling. Electronically Signed   By: Kerby Moors M.D.   On: 05/19/2022 17:49   DG Foot 2 Views Right  Result Date: 05/19/2022 CLINICAL DATA:  Fall. Pain. Bilateral leg and foot pain. History of prior right leg surgery. EXAM: RIGHT FOOT - 2 VIEW COMPARISON:  None available FINDINGS: Mildly decreased bone mineralization. Small plantar calcaneal heel spur. Mild hammertoe angulation of all 5 digits. Mild great toe metatarsophalangeal joint space narrowing. No acute fracture is seen. No dislocation. Mild vascular calcifications. IMPRESSION: Mild great toe metatarsophalangeal joint osteoarthritis. Small plantar calcaneal heel spur. Electronically Signed   By: Yvonne Kendall M.D.   On: 05/19/2022 17:48   DG Tibia/Fibula Left  Result Date: 05/19/2022 CLINICAL DATA:  fall, pain EXAM: LEFT TIBIA AND FIBULA - 2 VIEW COMPARISON:  None available FINDINGS: There is no evidence of fracture or other focal bone lesions. 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 Visualized ankle grossly unremarkable. Soft tissues are unremarkable. Vascular calcifications. IMPRESSION: 1. No acute displaced fracture or dislocation. 2. 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 Electronically Signed   By: Iven Finn M.D.   On: 05/19/2022 17:46   DG Tibia/Fibula  Right  Result Date: 05/19/2022 CLINICAL DATA:  fall, pain EXAM: RIGHT TIBIA AND FIBULA - 2 VIEW COMPARISON:  None Available. FINDINGS: There is no evidence of fracture or other focal bone lesions. Soft tissues are unremarkable. Tricompartmental severe degenerative changes of the visualized knee. Partially visualized ankle grossly unremarkable. Vascular clips overlie small leg. IMPRESSION: 1. No acute displaced fracture or dislocation. 2. Tricompartmental severe degenerative changes of the visualized knee. Electronically Signed   By: Iven Finn M.D.   On: 05/19/2022 17:45   CT HEAD WO CONTRAST (5MM)  Result Date: 05/19/2022 CLINICAL DATA:  Trauma EXAM: CT HEAD WITHOUT CONTRAST TECHNIQUE: Contiguous axial images were obtained from the base of the skull through the vertex without intravenous contrast. RADIATION DOSE REDUCTION: This exam was performed according to the departmental dose-optimization program which includes automated exposure control, adjustment of the mA and/or kV according to patient size and/or use of iterative reconstruction technique. COMPARISON:  04/03/2022 FINDINGS: Brain: No acute findings are seen in noncontrast CT brain. There are no signs of bleeding within the cranium. Cortical sulci are prominent. There is decreased density in the periventricular and subcortical white matter. Vascular: There are scattered arterial calcifications. Skull: Unremarkable. Sinuses/Orbits: Unremarkable. Other: No significant interval changes are noted. IMPRESSION: No acute intracranial findings are seen in noncontrast CT brain. Atrophy. Small-vessel disease. Electronically Signed   By: Elmer Picker M.D.   On: 05/19/2022 14:51   DG Chest 2 View  Result Date: 05/19/2022 CLINICAL DATA:  Lower extremity edema.  History of lung cancer. EXAM: CHEST - 2 VIEW COMPARISON:  Chest radiograph 03/29/2022 FINDINGS: The cardiomediastinal silhouette is within normal limits. Left upper lobe fiducial markers and  surrounding parenchymal opacity are unchanged and related to previously treated lung cancer. There is associated left upper lobe volume loss. The right lung is clear. No sizable pleural effusion or pneumothorax is identified. No acute osseous abnormality is seen. IMPRESSION: No evidence of active cardiopulmonary disease. Electronically Signed   By: Logan Bores M.D.   On: 05/19/2022 14:14      Assessment/Plan 88 year old male presenting with leg swelling and CT findings of acute cholecystitis.  I personally reviewed his CT scan, which shows gallbladder wall thickening and pericholecystic fluid, as well as gallstones.  Patient denied pain at the time of my exam, however he was focally tender in the right upper quadrant.  He has multiple medical comorbidities and is somewhat frail.  I discussed the possibility of cholecystectomy with him and his daughter, however his INR will first need to normalize.  Would also consider PTC given his comorbidities.  For tonight, recommend IV antibiotics.  He may have clear liquids until midnight, after which time he should remain n.p.o. in case of procedure tomorrow.  Please hold anticoagulation and repeat INR in a.m.  Michaelle Birks, [facility], Hepatobiliary and Pancreatic Surgery 05/19/22 8:58 PM

## 2022-05-19 NOTE — ED Notes (Signed)
Patient transported to X-ray 

## 2022-05-19 NOTE — ED Provider Triage Note (Signed)
Emergency Medicine Provider Triage Evaluation Note  Robert Fisher , a 88 y.o. male  was evaluated in triage.  Pt complains of bilateral lower extremity edema.  Daughter noticed this a few days ago.  Also concerned that he may be constipated.  Apparently this morning around 1 AM had a fall while he was in his home.  Daughter believes that because he has a bar close to his bed that he was able to stand himself up.  He was evaluated by EMS this morning and was told to come to the ER.  He is on Coumadin as well as Lasix 3 times a week.  Review of Systems  Positive: Constipation, edema Negative: Headache, chest pain  Physical Exam  BP (!) 107/59 (BP Location: Left Arm)   Pulse 80   Temp 97.9 F (36.6 C) (Oral)   Resp 17   Ht 5\' 8"  (1.727 m)   Wt 83.9 kg   SpO2 92%   BMI 28.13 kg/m  Gen:   Awake, no distress   Resp:  Normal effort  MSK:   Moves extremities without difficulty  Other:  Pitting edema noted to bilateral lower extremities that appear symmetrical.  Equal strength and sensation of bilateral upper and lower extremities.  Alert  Medical Decision Making  Medically screening exam initiated at 1:40 PM.  Appropriate orders placed.  Jodene Nam was informed that the remainder of the evaluation will be completed by another provider, this initial triage assessment does not replace that evaluation, and the importance of remaining in the ED until their evaluation is complete.  Work-up initiated   Delia Heady, [place] 05/19/22 1342

## 2022-05-19 NOTE — ED Provider Notes (Signed)
[facility] EMERGENCY DEPARTMENT Provider Note   CSN: [id number] Arrival date & time: 05/19/22  1248     History  Chief Complaint  Patient presents with   Fall   Leg Swelling    Robert Fisher is a 88 y.o. male.  Patient presents with multiple different concerns.  Primarily family has noticed patient's had worsening leg edema bilateral, patient has been compliant with Lasix 20 mg 4 times a day.  No significant shortness of breath or new cough.  No fevers or chills.  In addition patient reported he had a fall from his wheelchair without significant injury earlier this morning.  Unsure exactly how long he was on the ground but says not too long.  Patient requires walker and wheelchair for mobility.  Family has cameras and follows them closely but he does live at home with his wife.  Patient denies significant head injury with the fall.  No neck or back pain.  Family notes he is generally weaker than normal the past few days.  Patient's had intermittent right abdominal pain for which they thought might be constipation.  Patient was supposed to have CT scans done today with history of lung abnormality/nodule however due to worsening other signs and symptoms sent to the ER.  Patient is on Coumadin and takes 2 mg daily and then on specific days takes 2.5 mg.       Home Medications Prior to Admission medications   Medication Sig Start Date End Date Taking? Authorizing Provider  Acetaminophen (TYLENOL ARTHRITIS PAIN PO) Take by mouth 2 (two) times daily.    [provider]  aspirin EC 81 MG tablet Take 81 mg by mouth every evening.     [provider]  atorvastatin (LIPITOR) 40 MG tablet Take 40 mg by mouth every evening.  09/14/15   [provider]  buPROPion (WELLBUTRIN) 100 MG tablet Take 100 mg by mouth 2 (two) times daily. Taking 1 tablet every morning    [provider]  calcium citrate-vitamin D (CITRACAL+D) 315-200 MG-UNIT tablet  Take 1 tablet by mouth 2 (two) times daily.    [provider]  cefdinir (OMNICEF) 300 MG capsule Take by mouth. 03/24/22   [provider]  clonazePAM (KLONOPIN) 0.5 MG tablet Take 0.25 mg by mouth 2 (two) times daily.    [provider]  fenofibrate 160 MG tablet Take 160 mg by mouth daily.    [provider]  furosemide (LASIX) 20 MG tablet Take 20 mg by mouth See admin instructions. Take 1 tablet by mouth on Monday Wednesday and Friday    [provider]  hydrochlorothiazide (HYDRODIURIL) 12.5 MG tablet Take 12.5 mg by mouth daily.    [provider]  hydrocortisone cream 1 % Apply 1 application topically daily as needed for itching (For rash).    [provider]  levETIRAcetam (KEPPRA) 500 MG tablet Take 1/2 tablet twice a day 12/13/21   Cameron Sprang, MD  linagliptin (TRADJENTA) 5 MG TABS tablet Take 5 mg by mouth every other day.    [provider]  metFORMIN (GLUCOPHAGE) 500 MG tablet Take 500 mg by mouth in the morning, at noon, and at bedtime.     [provider]  metoprolol tartrate (LOPRESSOR) 25 MG tablet Take 25 mg by mouth daily.     [provider]  Multiple Vitamin (MULTIVITAMIN WITH MINERALS) TABS tablet Take 1 tablet by mouth daily.    [provider]  OLANZapine (ZYPREXA) 10 MG tablet Take 15 mg by mouth at bedtime. 1.5 tablets at night    [provider]  Omega-3 Fatty Acids (FISH OIL PO) Take 1 capsule by mouth daily.    [provider]  Polyethyl Glycol-Propyl Glycol (SYSTANE FREE OP) Place 1 drop into both eyes in the morning and at bedtime.    [provider]  potassium chloride SA (KLOR-CON) 20 MEQ tablet Take 10 mEq by mouth daily. Take one tablet (10mg ) by mouth Daily    [provider]  Probiotic Product (PROBIOTIC PO) Take 1 tablet by mouth every evening.    [provider]  warfarin (COUMADIN) 5 MG tablet Take 2.5-5 mg by mouth  See admin instructions. Take 5mg  tablet by mouth on Mondays, Wednesdays, Fridays, and take 2.5mg  tablet by mouth  on Tuesday, Thursday, Saturday and Sunday    [provider]      Allergies    Haloperidol lactate and Sildenafil    Review of Systems   Review of Systems  Unable to perform ROS: Age    Physical Exam Updated Vital Signs BP (!) 146/65   Pulse 87   Temp 97.9 F (36.6 C) (Oral)   Resp 17   Ht 5\' 8"  (1.727 m)   Wt 83.9 kg   SpO2 97%   BMI 28.13 kg/m  Physical Exam Vitals and nursing note reviewed.  Constitutional:      General: He is not in acute distress.    Appearance: He is well-developed.  HENT:     Head: Normocephalic and atraumatic.     Mouth/Throat:     Mouth: Mucous membranes are moist.  Eyes:     General:        Right eye: No discharge.        Left eye: No discharge.     Conjunctiva/sclera: Conjunctivae normal.  Neck:     Trachea: No tracheal deviation.  Cardiovascular:     Rate and Rhythm: Normal rate.  Pulmonary:     Effort: Pulmonary effort is normal.     Breath sounds: Normal breath sounds.  Abdominal:     General: There is no distension.     Palpations: Abdomen is soft.     Tenderness: There is abdominal tenderness (RUQ). There is no guarding.  Musculoskeletal:        General: Swelling (bilateral LE pitting) present.     Cervical back: Normal range of motion and neck supple. No rigidity.  Skin:    General: Skin is warm.     Capillary Refill: Capillary refill takes less than 2 seconds.     Findings: No rash.  Neurological:     General: No focal deficit present.     Mental Status: He is alert.     Cranial Nerves: No cranial nerve deficit.     Comments: Generally weak, needs assistance to sit up.  Equal strength bilateral extremities.  Follows discussion.  Psychiatric:     Comments: Tired and sleepy, awakens to loud verbal discussion.     ED Results / Procedures / Treatments   Labs (all labs ordered are listed, but only  abnormal results are displayed) Labs Reviewed  COMPREHENSIVE METABOLIC PANEL - Abnormal; Notable for the following components:      Result Value   Glucose, Bld 179 (*)    Creatinine, Ser 1.36 (*)    Albumin 2.9 (*)    AST 166 (*)    ALT 151 (*)    GFR, Estimated  51 (*)    All other components within normal limits  CBC WITH DIFFERENTIAL/PLATELET - Abnormal; Notable for the following components:   Monocytes Absolute 1.2 (*)    All other components within normal limits  PROTIME-INR - Abnormal; Notable for the following components:   Prothrombin Time 49.3 (*)    INR 5.5 (*)    All other components within normal limits  BRAIN NATRIURETIC PEPTIDE - Abnormal; Notable for the following components:   B Natriuretic Peptide 134.3 (*)    All other components within normal limits  URINALYSIS, ROUTINE W REFLEX MICROSCOPIC - Abnormal; Notable for the following components:   APPearance HAZY (*)    Protein, ur 30 (*)    All other components within normal limits  LIPASE, BLOOD  CK    EKG EKG Interpretation  Date/Time:  Monday May 19 2022 13:16:24 EDT Ventricular Rate:  80 PR Interval:  246 QRS Duration: 90 QT Interval:  350 QTC Calculation: 403 R Axis:   -71 Text Interpretation: Sinus rhythm with sinus arrhythmia with 1st degree A-V block Left anterior fascicular block Abnormal QRS-T angle, consider primary T wave abnormality Abnormal ECG When compared with ECG of 29-Mar-2022 17:08, PREVIOUS ECG IS PRESENT Confirmed by Elnora Morrison [phone]) on 05/19/2022 4:25:38 PM  Radiology CT CHEST ABDOMEN PELVIS W CONTRAST  Result Date: 05/19/2022 CLINICAL DATA:  RIGHT lower quadrant pain EXAM: CT CHEST, ABDOMEN, AND PELVIS WITH CONTRAST TECHNIQUE: Multidetector CT imaging of the chest, abdomen and pelvis was performed following the standard protocol during bolus administration of intravenous contrast. RADIATION DOSE REDUCTION: This exam was performed according to the departmental dose-optimization  program which includes automated exposure control, adjustment of the mA and/or kV according to patient size and/or use of iterative reconstruction technique. CONTRAST:  172mL OMNIPAQUE IOHEXOL 300 MG/ML  SOLN COMPARISON:  Chest CT 11/14/2021 FINDINGS: CT CHEST FINDINGS Cardiovascular: No significant vascular findings. Normal heart size. No pericardial effusion. Mediastinum/Nodes: No axillary or supraclavicular adenopathy. No mediastinal or hilar adenopathy. No pericardial fluid. Esophagus normal. Lungs/Pleura: Angular band of dense atelectasis in the LEFT upper lobe is not changed. No suspicious pulmonary nodules. Musculoskeletal: No aggressive osseous lesion. CT ABDOMEN AND PELVIS FINDINGS Hepatobiliary: Liver is normal. The gallbladder is distended to 5.3 cm. Gallbladder wall is thickened and there is inflammatory stranding surrounding the gallbladder. There are several dense gallstones towards the neck of the gallbladder. Common bile duct normal caliber. No intrahepatic duct dilatation. Pancreas: No pancreatic inflammation.  Pancreatic duct normal. Spleen: Normal spleen Adrenals/urinary tract: Adrenal glands are normal. Kidneys, ureters and bladder normal. Stomach/Bowel: Stomach, small bowel, appendix, and cecum are normal. The colon and rectosigmoid colon are normal. Vascular/Lymphatic: Abdominal aorta is normal caliber. There is no retroperitoneal or periportal lymphadenopathy. No pelvic lymphadenopathy. Reproductive: Prostate unremarkable Other: Fall amount of fluid along the RIGHT pericolic gutter related to the presumed gallbladder inflammation. Musculoskeletal: No aggressive osseous lesion. IMPRESSION: Chest Impression: 1. No acute findings in chest. 2. Stable consolidation /  atelectasis in the LEFT upper lobe. Abdomen / Pelvis Impression: 1. CT findings consistent with acute cholecystitis. 2. No bowel obstruction or ureteral obstruction. Electronically Signed   By: Suzy Bouchard M.D.   On: 05/19/2022  18:43   DG Foot 2 Views Left  Result Date: 05/19/2022 CLINICAL DATA:  Bilateral foot pain for 1 day after falling out of bed. EXAM: LEFT FOOT - 2 VIEW COMPARISON:  None Available. FINDINGS: There is no evidence of fracture or dislocation. There is no evidence of arthropathy or other  focal bone abnormality. There is dorsal soft tissue swelling overlying the metacarpal bones. IMPRESSION: 1. No acute bone abnormality. 2. Dorsal soft tissue swelling. Electronically Signed   By: Kerby Moors M.D.   On: 05/19/2022 17:49   DG Foot 2 Views Right  Result Date: 05/19/2022 CLINICAL DATA:  Fall. Pain. Bilateral leg and foot pain. History of prior right leg surgery. EXAM: RIGHT FOOT - 2 VIEW COMPARISON:  None available FINDINGS: Mildly decreased bone mineralization. Small plantar calcaneal heel spur. Mild hammertoe angulation of all 5 digits. Mild great toe metatarsophalangeal joint space narrowing. No acute fracture is seen. No dislocation. Mild vascular calcifications. IMPRESSION: Mild great toe metatarsophalangeal joint osteoarthritis. Small plantar calcaneal heel spur. Electronically Signed   By: Yvonne Kendall M.D.   On: 05/19/2022 17:48   DG Tibia/Fibula Left  Result Date: 05/19/2022 CLINICAL DATA:  fall, pain EXAM: LEFT TIBIA AND FIBULA - 2 VIEW COMPARISON:  None available FINDINGS: There is no evidence of fracture or other focal bone lesions. 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 Visualized ankle grossly unremarkable. Soft tissues are unremarkable. Vascular calcifications. IMPRESSION: 1. No acute displaced fracture or dislocation. 2. 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 Electronically Signed   By: Iven Finn M.D.   On: 05/19/2022 17:46   DG Tibia/Fibula Right  Result Date: 05/19/2022 CLINICAL DATA:  fall, pain EXAM: RIGHT TIBIA AND FIBULA - 2 VIEW COMPARISON:  None Available. FINDINGS: There is no evidence of fracture or other focal bone  lesions. Soft tissues are unremarkable. Tricompartmental severe degenerative changes of the visualized knee. Partially visualized ankle grossly unremarkable. Vascular clips overlie small leg. IMPRESSION: 1. No acute displaced fracture or dislocation. 2. Tricompartmental severe degenerative changes of the visualized knee. Electronically Signed   By: Iven Finn M.D.   On: 05/19/2022 17:45   CT HEAD WO CONTRAST (5MM)  Result Date: 05/19/2022 CLINICAL DATA:  Trauma EXAM: CT HEAD WITHOUT CONTRAST TECHNIQUE: Contiguous axial images were obtained from the base of the skull through the vertex without intravenous contrast. RADIATION DOSE REDUCTION: This exam was performed according to the departmental dose-optimization program which includes automated exposure control, adjustment of the mA and/or kV according to patient size and/or use of iterative reconstruction technique. COMPARISON:  04/03/2022 FINDINGS: Brain: No acute findings are seen in noncontrast CT brain. There are no signs of bleeding within the cranium. Cortical sulci are prominent. There is decreased density in the periventricular and subcortical white matter. Vascular: There are scattered arterial calcifications. Skull: Unremarkable. Sinuses/Orbits: Unremarkable. Other: No significant interval changes are noted. IMPRESSION: No acute intracranial findings are seen in noncontrast CT brain. Atrophy. Small-vessel disease. Electronically Signed   By: Elmer Picker M.D.   On: 05/19/2022 14:51   DG Chest 2 View  Result Date: 05/19/2022 CLINICAL DATA:  Lower extremity edema.  History of lung cancer. EXAM: CHEST - 2 VIEW COMPARISON:  Chest radiograph 03/29/2022 FINDINGS: The cardiomediastinal silhouette is within normal limits. Left upper lobe fiducial markers and surrounding parenchymal opacity are unchanged and related to previously treated lung cancer. There is associated left upper lobe volume loss. The right lung is clear. No sizable pleural  effusion or pneumothorax is identified. No acute osseous abnormality is seen. IMPRESSION: No evidence of active cardiopulmonary disease. Electronically Signed   By: Logan Bores M.D.   On: 05/19/2022 14:14    Procedures Procedures    Medications Ordered in ED Medications  piperacillin-tazobactam (ZOSYN) IVPB 3.375 g (has no administration in time range)  iohexol (OMNIPAQUE) 300 MG/ML solution 100 mL (100 mLs Intravenous Contrast Given 05/19/22 1819)    ED Course/ Medical Decision Making/ A&P                           Medical Decision Making Amount and/or Complexity of Data Reviewed Labs: ordered. Radiology: ordered.  Risk Prescription drug management. Decision regarding hospitalization.   Patient presents with general weakness, leg edema and fall earlier this morning.  Broad differential for weakness including metabolic such as hypoglycemia, hyponatremia, infectious such as urine or gallbladder with right flank pain, intracranial from the fall such as intracranial hemorrhage, heart failure, medication related, liver hemorrhage/hematoma from elevated INR, other.  Reviewed medication list updated with daughter and patient: furosemide 20 mg 4 times per week, coumadin HCTZ 12.5 mg daily, coumadin 2 mg every day but Tue/Th/Sat 2.5 mg total.  Patient's blood work ordered and independently reviewed showing acute on chronic renal insufficiency 1.36 creatinine, liver function elevated 166 and 151 respectively more than last labs obtained to compare, INR significantly elevated 5.5 patient Coumadin, BNP 134 mild elevated.  Patient not requiring oxygen.  CT abdomen pelvis ordered and added CT chest this patient was supposed to have 1 today and since he will need contrast.  Discussed ice chips only.  Discussed with CT for timely CT scan.  CT scan results reviewed no acute findings in the lung however patient has signs of acute cholecystitis which would explain recurrent pain on the right side and  general weakness.  Patient discussed with general surgery plan for medicine admission.  IV Zosyn ordered.  Surgery evaluated patient in the emergency room as did internal medicine for admission.        Final Clinical Impression(s) / ED Diagnoses Final diagnoses:  LFT elevation  Acute cholecystitis  Fall, initial encounter  Elevated INR  Bilateral leg edema    Rx / DC Orders ED Discharge Orders     None         Elnora Morrison, MD 05/19/22 2343

## 2022-05-19 NOTE — H&P (Addendum)
History and Physical  Robert Fisher:[id number] DOB: 10/18/1934 DOA: 05/19/2022  Referring physician: Dr. Reather Converse, [place]  PCP: Shon Baton, MD  Outpatient Specialists:  Patient coming from: Palliative care, podiatry, cardiology, neurology  Chief Complaint: Leg swelling and right upper quadrant abdominal pain.  HPI: Robert Fisher is a 88 y.o. male with medical history significant for hypertension, type 2 diabetes, hyperlipidemia, DVT/PE on Coumadin, chronic bilateral lower extremity edema on furosemide 4 times a week and HCTZ daily, former tobacco user 100 pack years, quit 6 years ago, COPD, lung cancer, paranoid schizophrenia, seizure disorder followed by neurology, who presented to [facility] ED from home due to progressively worsening bilateral lower extremity edema despite taking home diuretics.  Associated with progressive global weakness and a fall from his wheelchair this morning.  Endorses intermittent right upper quadrant abdominal pain.  No nausea.  No vomiting.    In the ED, work-up revealed acute calculous cholecystitis seen on CT scan, elevated liver chemistries, normal total bilirubin, no common bile duct dilatation, supratherapeutic INR 5.5 on Coumadin.  The patient was started on IV antibiotics empirically, Zosyn in the ED.  EDP discussed the case with general surgery who will see in consultation.  The patient was admitted by the hospitalist service, [facility].  ED Course: Tmax 97.9.  BP 140/66, pulse 83, respiratory 17, O2 saturation 96% on room air.  Lab studies remarkable for serum glucose 179, BUN 17, creatinine 1.36, albumin 2.9, AST 166, ALT 151, GFR 51 from baseline GFR greater than 60 and baseline creatinine of 1.14.  BNP 134.  CBC essentially unremarkable.  Review of Systems: Review of systems as noted in the HPI. All other systems reviewed and are negative.   Past Medical History:  Diagnosis Date   Borderline diabetes    takes Metformin   Cancer ([place])    lung 2020 per  spouse LUL   COPD (chronic obstructive pulmonary disease) ([place])    Diabetes mellitus without complication ([place])    DVT (deep venous thrombosis) ([place])    Full dentures    Hearing aid worn    B/L   HOH (hard of hearing)    Hypertension    Left upper lobe pulmonary nodule    Memory problem    Pneumonia    Schizo affective schizophrenia ([place])    Seizures ([place])    Past Surgical History:  Procedure Laterality Date   cancer removal     legs   CATARACT EXTRACTION     COLONOSCOPY     FUDUCIAL PLACEMENT N/A 03/09/2019   Procedure: PLACEMENT OF FUDUCIAL;  Surgeon: [name], MD;  Location: [place];  Service: Thoracic;  Laterality: N/A;   THROMBECTOMY     VIDEO BRONCHOSCOPY WITH ENDOBRONCHIAL NAVIGATION N/A 03/09/2019   Procedure: VIDEO BRONCHOSCOPY WITH ENDOBRONCHIAL NAVIGATION;  Surgeon: [name], MD;  Location: [place];  Service: Thoracic;  Laterality: N/A;    Social History:  reports that he quit smoking about 7 years ago. His smoking use included cigarettes and pipe. He started smoking about 69 years ago. He has a 50.00 pack-year smoking history. He has never used smokeless tobacco. He reports that he does not drink alcohol and does not use drugs.   Allergies  Allergen Reactions   Haloperidol Lactate Other (See Comments)    "made him climb the walls."   Sildenafil Other (See Comments)    "pain in right thigh."   Family history: The patient and his sister were adopted Sister with history of  cancer, unspecified.  Prior to Admission medications   Medication Sig Start Date End Date Taking? Authorizing Provider  Acetaminophen (TYLENOL ARTHRITIS PAIN PO) Take by mouth 2 (two) times daily.    [provider]  aspirin EC 81 MG tablet Take 81 mg by mouth every evening.     [provider]  atorvastatin (LIPITOR) 40 MG tablet Take 40 mg by mouth every evening.  09/14/15   [provider]  buPROPion (WELLBUTRIN) 100 MG tablet Take 100 mg by  mouth 2 (two) times daily. Taking 1 tablet every morning    [provider]  calcium citrate-vitamin D (CITRACAL+D) 315-200 MG-UNIT tablet Take 1 tablet by mouth 2 (two) times daily.    [provider]  cefdinir (OMNICEF) 300 MG capsule Take by mouth. 03/24/22   [provider]  clonazePAM (KLONOPIN) 0.5 MG tablet Take 0.25 mg by mouth 2 (two) times daily.    [provider]  fenofibrate 160 MG tablet Take 160 mg by mouth daily.    [provider]  furosemide (LASIX) 20 MG tablet Take 20 mg by mouth See admin instructions. Take 1 tablet by mouth on Monday Wednesday and Friday    [provider]  hydrochlorothiazide (HYDRODIURIL) 12.5 MG tablet Take 12.5 mg by mouth daily.    [provider]  hydrocortisone cream 1 % Apply 1 application topically daily as needed for itching (For rash).    [provider]  levETIRAcetam (KEPPRA) 500 MG tablet Take 1/2 tablet twice a day 12/13/21   Cameron Sprang, MD  linagliptin (TRADJENTA) 5 MG TABS tablet Take 5 mg by mouth every other day.    [provider]  metFORMIN (GLUCOPHAGE) 500 MG tablet Take 500 mg by mouth in the morning, at noon, and at bedtime.     [provider]  metoprolol tartrate (LOPRESSOR) 25 MG tablet Take 25 mg by mouth daily.     [provider]  Multiple Vitamin (MULTIVITAMIN WITH MINERALS) TABS tablet Take 1 tablet by mouth daily.    [provider]  OLANZapine (ZYPREXA) 10 MG tablet Take 15 mg by mouth at bedtime. 1.5 tablets at night    [provider]  Omega-3 Fatty Acids (FISH OIL PO) Take 1 capsule by mouth daily.    [provider]  Polyethyl Glycol-Propyl Glycol (SYSTANE FREE OP) Place 1 drop into both eyes in the morning and at bedtime.    [provider]  potassium chloride SA (KLOR-CON) 20 MEQ tablet Take 10 mEq by mouth daily. Take one tablet (10mg ) by mouth Daily    [provider]   Probiotic Product (PROBIOTIC PO) Take 1 tablet by mouth every evening.    [provider]  warfarin (COUMADIN) 5 MG tablet Take 2.5-5 mg by mouth See admin instructions. Take 5mg  tablet by mouth on Mondays, Wednesdays, Fridays, and take 2.5mg  tablet by mouth  on Tuesday, Thursday, Saturday and Sunday    [provider]    Physical Exam: BP (!) 157/72   Pulse 94   Temp 97.9 F (36.6 C) (Oral)   Resp 17   Ht 5\' 8"  (1.727 m)   Wt 83.9 kg   SpO2 97%   BMI 28.13 kg/m   General: 88 y.o. year-old male well developed well nourished in no acute distress.  Alert and interactive. Cardiovascular: Regular rate and rhythm with no rubs or gallops.  No thyromegaly or JVD noted.  1+ pitting edema in lower extremity bilaterally.  Respiratory: Clear to auscultation with no wheezes or rales.  Poor inspiratory effort. Abdomen: Obese, right upper quadrant tenderness with moderate palpation.  Bowel sounds present.   Muskuloskeletal: No cyanosis or clubbing.  1+ pitting edema noted bilaterally Neuro: CN II-XII intact, strength, sensation, reflexes Skin: Sacral decubitus pressure wound. Psychiatry: Judgement and insight appear normal. Mood is appropriate for condition and setting          Labs on Admission:  Basic Metabolic Panel: Recent Labs  Lab 05/19/22 1343  NA 139  K 3.6  CL 102  CO2 26  GLUCOSE 179*  BUN 17  CREATININE 1.36*  CALCIUM 9.6   Liver Function Tests: Recent Labs  Lab 05/19/22 1343  AST 166*  ALT 151*  ALKPHOS 57  BILITOT 0.7  PROT 6.6  ALBUMIN 2.9*   Recent Labs  Lab 05/19/22 1343  LIPASE 29   No results for input(s): "AMMONIA" in the last 168 hours. CBC: Recent Labs  Lab 05/19/22 1343  WBC 9.9  NEUTROABS 7.4  HGB 14.8  HCT 45.8  MCV 94.4  PLT 259   Cardiac Enzymes: Recent Labs  Lab 05/19/22 1343  CKTOTAL 137    BNP (last 3 results) Recent Labs    03/28/22 1906 05/19/22 1343  BNP 201.0* 134.3*    ProBNP (last 3  results) No results for input(s): "PROBNP" in the last 8760 hours.  CBG: No results for input(s): "GLUCAP" in the last 168 hours.  Radiological Exams on Admission: CT CHEST ABDOMEN PELVIS W CONTRAST  Result Date: 05/19/2022 CLINICAL DATA:  RIGHT lower quadrant pain EXAM: CT CHEST, ABDOMEN, AND PELVIS WITH CONTRAST TECHNIQUE: Multidetector CT imaging of the chest, abdomen and pelvis was performed following the standard protocol during bolus administration of intravenous contrast. RADIATION DOSE REDUCTION: This exam was performed according to the departmental dose-optimization program which includes automated exposure control, adjustment of the mA and/or kV according to patient size and/or use of iterative reconstruction technique. CONTRAST:  123mL OMNIPAQUE IOHEXOL 300 MG/ML  SOLN COMPARISON:  Chest CT 11/14/2021 FINDINGS: CT CHEST FINDINGS Cardiovascular: No significant vascular findings. Normal heart size. No pericardial effusion. Mediastinum/Nodes: No axillary or supraclavicular adenopathy. No mediastinal or hilar adenopathy. No pericardial fluid. Esophagus normal. Lungs/Pleura: Angular band of dense atelectasis in the LEFT upper lobe is not changed. No suspicious pulmonary nodules. Musculoskeletal: No aggressive osseous lesion. CT ABDOMEN AND PELVIS FINDINGS Hepatobiliary: Liver is normal. The gallbladder is distended to 5.3 cm. Gallbladder wall is thickened and there is inflammatory stranding surrounding the gallbladder. There are several dense gallstones towards the neck of the gallbladder. Common bile duct normal caliber. No intrahepatic duct dilatation. Pancreas: No pancreatic inflammation.  Pancreatic duct normal. Spleen: Normal spleen Adrenals/urinary tract: Adrenal glands are normal. Kidneys, ureters and bladder normal. Stomach/Bowel: Stomach, small bowel, appendix, and cecum are normal. The colon and rectosigmoid colon are normal. Vascular/Lymphatic: Abdominal aorta is normal caliber. There is no  retroperitoneal or periportal lymphadenopathy. No pelvic lymphadenopathy. Reproductive: Prostate unremarkable Other: Fall amount of fluid along the RIGHT pericolic gutter related to the presumed gallbladder inflammation. Musculoskeletal: No aggressive osseous lesion. IMPRESSION: Chest Impression: 1. No acute findings in chest. 2. Stable consolidation /  atelectasis in the LEFT upper lobe. Abdomen / Pelvis Impression: 1. CT findings consistent with acute cholecystitis. 2. No bowel obstruction or ureteral obstruction. Electronically Signed   By: Suzy Bouchard M.D.   On: 05/19/2022 18:43   DG Foot 2 Views Left  Result Date: 05/19/2022 CLINICAL DATA:  Bilateral foot pain  for 1 day after falling out of bed. EXAM: LEFT FOOT - 2 VIEW COMPARISON:  None Available. FINDINGS: There is no evidence of fracture or dislocation. There is no evidence of arthropathy or other focal bone abnormality. There is dorsal soft tissue swelling overlying the metacarpal bones. IMPRESSION: 1. No acute bone abnormality. 2. Dorsal soft tissue swelling. Electronically Signed   By: Kerby Moors M.D.   On: 05/19/2022 17:49   DG Foot 2 Views Right  Result Date: 05/19/2022 CLINICAL DATA:  Fall. Pain. Bilateral leg and foot pain. History of prior right leg surgery. EXAM: RIGHT FOOT - 2 VIEW COMPARISON:  None available FINDINGS: Mildly decreased bone mineralization. Small plantar calcaneal heel spur. Mild hammertoe angulation of all 5 digits. Mild great toe metatarsophalangeal joint space narrowing. No acute fracture is seen. No dislocation. Mild vascular calcifications. IMPRESSION: Mild great toe metatarsophalangeal joint osteoarthritis. Small plantar calcaneal heel spur. Electronically Signed   By: Yvonne Kendall M.D.   On: 05/19/2022 17:48   DG Tibia/Fibula Left  Result Date: 05/19/2022 CLINICAL DATA:  fall, pain EXAM: LEFT TIBIA AND FIBULA - 2 VIEW COMPARISON:  None available FINDINGS: There is no evidence of fracture or other focal  bone lesions. 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 Visualized ankle grossly unremarkable. Soft tissues are unremarkable. Vascular calcifications. IMPRESSION: 1. No acute displaced fracture or dislocation. 2. 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 Electronically Signed   By: Iven Finn M.D.   On: 05/19/2022 17:46   DG Tibia/Fibula Right  Result Date: 05/19/2022 CLINICAL DATA:  fall, pain EXAM: RIGHT TIBIA AND FIBULA - 2 VIEW COMPARISON:  None Available. FINDINGS: There is no evidence of fracture or other focal bone lesions. Soft tissues are unremarkable. Tricompartmental severe degenerative changes of the visualized knee. Partially visualized ankle grossly unremarkable. Vascular clips overlie small leg. IMPRESSION: 1. No acute displaced fracture or dislocation. 2. Tricompartmental severe degenerative changes of the visualized knee. Electronically Signed   By: Iven Finn M.D.   On: 05/19/2022 17:45   CT HEAD WO CONTRAST (5MM)  Result Date: 05/19/2022 CLINICAL DATA:  Trauma EXAM: CT HEAD WITHOUT CONTRAST TECHNIQUE: Contiguous axial images were obtained from the base of the skull through the vertex without intravenous contrast. RADIATION DOSE REDUCTION: This exam was performed according to the departmental dose-optimization program which includes automated exposure control, adjustment of the mA and/or kV according to patient size and/or use of iterative reconstruction technique. COMPARISON:  04/03/2022 FINDINGS: Brain: No acute findings are seen in noncontrast CT brain. There are no signs of bleeding within the cranium. Cortical sulci are prominent. There is decreased density in the periventricular and subcortical white matter. Vascular: There are scattered arterial calcifications. Skull: Unremarkable. Sinuses/Orbits: Unremarkable. Other: No significant interval changes are noted. IMPRESSION: No acute intracranial findings are seen in  noncontrast CT brain. Atrophy. Small-vessel disease. Electronically Signed   By: Elmer Picker M.D.   On: 05/19/2022 14:51   DG Chest 2 View  Result Date: 05/19/2022 CLINICAL DATA:  Lower extremity edema.  History of lung cancer. EXAM: CHEST - 2 VIEW COMPARISON:  Chest radiograph 03/29/2022 FINDINGS: The cardiomediastinal silhouette is within normal limits. Left upper lobe fiducial markers and surrounding parenchymal opacity are unchanged and related to previously treated lung cancer. There is associated left upper lobe volume loss. The right lung is clear. No sizable pleural effusion or pneumothorax is identified. No acute osseous abnormality is seen. IMPRESSION: No evidence of active cardiopulmonary disease. Electronically Signed  By: Logan Bores M.D.   On: 05/19/2022 14:14    EKG: I independently viewed the EKG done and my findings are as followed: Sinus rhythm with sinus arrhythmia with first-degree AV block rate of 80.  QTc 403.  Assessment/Plan Present on Admission:  Acute cholecystitis  Principal Problem:   Acute cholecystitis  Acute calculous cholecystitis seen on CT scan Right upper quadrant abdominal pain Afebrile with no leukocytosis Elevated liver transaminases No common bile duct dilatation seen on CT scan Total bilirubin within normal range.   Started on broad-spectrum IV antibiotics, Zosyn in the ED, continue General surgery consulted by EDP, will see in consultation Analgesics as needed Ideally INR goal less than 1.5 or less than 2.0 prior to surgery  Chronic bilateral lower extremity edema The patient is on 20 mg furosemide 4 times a week and on HCTZ 12.5 mg daily. No cough, no dyspnea, not hypoxic Hold off home diuretics for now to avoid hypotension Elevate lower extremities Implement low salt diet less than 2 g/day Resume home Lasix when clinically stable.  Seizure disorder Resume home AEDs Seizure precautions  Type 2 diabetes with hyperglycemia Serum  glucose on presentation 179. Obtain hemoglobin A1c Start insulin sliding scale Hold off home oral hypoglycemics  CKD 2 Baseline creatinine appears to be 1.2 with GFR greater than 58 Presented with creatinine of 1.36 with GFR of 51 Avoid nephrotoxic agents and hypotension Monitor urine output with strict I's and O's  Hyperlipidemia Hold off home Lipitor and fenofibrate for now due to elevated liver chemistries Resume home Lipitor and fenofibrate when clinically stable  Paranoid schizophrenia Resume home regimen  History of DVT/PE on Coumadin Supratherapeutic INR 5.5 Hold off Coumadin Repeat INR in the morning  Supratherapeutic INR INR 5.5, goal INR between 2 and 3. Hold home Coumadin Appreciate pharmacy's assistance.  Constipation Reported no bowel movement for days Bowel regimen in place  Sacral decubitus pressure ulcer, POA Wound care specialist consulted Continue local wound care  Ambulatory dysfunction, uses a wheelchair and a cane intermittently PT OT evaluation when clinically stable Fall precautions    Critical care time: 65 minutes.    DVT prophylaxis: Supratherapeutic INR on Coumadin prior to admission  Code Status: Limited code, per the patient and his daughter at bedside, no CPR.  Okay for intubation.  Family Communication: His daughter at bedside  Disposition Plan: Admitted to telemetry surgical unit  Consults called: General surgery consulted by EDP, wound care specialist.  Admission status: Inpatient status.   Status is: Inpatient The patient will require at least 2 midnights for further evaluation and treatment of present condition.   Kayleen Memos MD [facility] Pager [phone]  If 7PM-7AM, please contact night-coverage [email or web address] Password [facility]  05/19/2022, 7:59 PM

## 2022-05-19 NOTE — Progress Notes (Signed)
ANTICOAGULATION CONSULT NOTE - Initial Consult  Pharmacy Consult for warfarin Indication: Hx VTE  Allergies  Allergen Reactions   Haloperidol Lactate Other (See Comments)    "made him climb the walls."   Sildenafil Other (See Comments)    "pain in right thigh."    Patient Measurements: Height: 5\' 8"  (172.7 cm) Weight: 83.9 kg (185 lb) IBW/kg (Calculated) : 68.4  Vital Signs: Temp: 97.9 F (36.6 C) (06/19 1311) Temp Source: Oral (06/19 1311) BP: 157/72 (06/19 1945) Pulse Rate: 94 (06/19 1945)  Labs: Recent Labs    05/19/22 1343  HGB 14.8  HCT 45.8  PLT 259  LABPROT 49.3*  INR 5.5*  CREATININE 1.36*  CKTOTAL 137    Estimated Creatinine Clearance: 41.9 mL/min (A) (by C-G formula based on SCr of 1.36 mg/dL (H)).   Medical History: Past Medical History:  Diagnosis Date   Borderline diabetes    takes Metformin   Cancer ([place])    lung 2020 per spouse LUL   COPD (chronic obstructive pulmonary disease) ([place])    Diabetes mellitus without complication ([place])    DVT (deep venous thrombosis) ([place])    Full dentures    Hearing aid worn    B/L   HOH (hard of hearing)    Hypertension    Left upper lobe pulmonary nodule    Memory problem    Pneumonia    Schizo affective schizophrenia ([place])    Seizures ([place])    Assessment: 25 YOM presenting with leg swelling and fall, hx of PE/DVT on warfarin PTA, INR on admission supratherapeutic at 5.5.  CT with cholecystitis   Goal of Therapy:  INR 2-3 Monitor platelets by anticoagulation protocol: Yes   Plan:  Hold warfarin tonight Daily INR, s/s bleeding  Bertis Ruddy, PharmD Clinical Pharmacist ED Pharmacist Phone # [phone] 05/19/2022 8:05 PM

## 2022-05-20 ENCOUNTER — Encounter: Payer: Self-pay | Admitting: Radiation Oncology

## 2022-05-20 ENCOUNTER — Ambulatory Visit: Payer: Medicare Other | Admitting: Radiation Oncology

## 2022-05-20 ENCOUNTER — Other Ambulatory Visit: Payer: Self-pay | Admitting: Radiation Oncology

## 2022-05-20 ENCOUNTER — Ambulatory Visit
Admit: 2022-05-20 | Discharge: 2022-05-20 | Disposition: A | Discharge status: Home / Self Care | Payer: Medicare Other | Attending: Radiation Oncology | Admitting: Radiation Oncology

## 2022-05-20 DIAGNOSIS — C3412 Malignant neoplasm of upper lobe, left bronchus or lung: Secondary | ICD-10-CM

## 2022-05-20 DIAGNOSIS — K810 Acute cholecystitis: Secondary | ICD-10-CM | POA: Diagnosis not present

## 2022-05-20 LAB — CBC WITH DIFFERENTIAL/PLATELET
Abs Immature Granulocytes: 0.06 10*3/uL (ref 0.00–0.07)
Basophils Absolute: 0 10*3/uL (ref 0.0–0.1)
Basophils Relative: 0 %
Eosinophils Absolute: 0 10*3/uL (ref 0.0–0.5)
Eosinophils Relative: 0 %
HCT: 44.8 % (ref 39.0–52.0)
Hemoglobin: 14.2 g/dL (ref 13.0–17.0)
Immature Granulocytes: 1 %
Lymphocytes Relative: 11 %
Lymphs Abs: 1 10*3/uL (ref 0.7–4.0)
MCH: 29.3 pg (ref 26.0–34.0)
MCHC: 31.7 g/dL (ref 30.0–36.0)
MCV: 92.6 fL (ref 80.0–100.0)
Monocytes Absolute: 0.9 10*3/uL (ref 0.1–1.0)
Monocytes Relative: 10 %
Neutro Abs: 7 10*3/uL (ref 1.7–7.7)
Neutrophils Relative %: 78 %
Platelets: 247 10*3/uL (ref 150–400)
RBC: 4.84 MIL/uL (ref 4.22–5.81)
RDW: 15 % (ref 11.5–15.5)
WBC: 9 10*3/uL (ref 4.0–10.5)
nRBC: 0 % (ref 0.0–0.2)

## 2022-05-20 LAB — COMPREHENSIVE METABOLIC PANEL
ALT: 163 U/L — ABNORMAL HIGH (ref 0–44)
AST: 155 U/L — ABNORMAL HIGH (ref 15–41)
Albumin: 2.8 g/dL — ABNORMAL LOW (ref 3.5–5.0)
Alkaline Phosphatase: 61 U/L (ref 38–126)
Anion gap: 11 (ref 5–15)
BUN: 13 mg/dL (ref 8–23)
CO2: 25 mmol/L (ref 22–32)
Calcium: 9.3 mg/dL (ref 8.9–10.3)
Chloride: 101 mmol/L (ref 98–111)
Creatinine, Ser: 1.11 mg/dL (ref 0.61–1.24)
GFR, Estimated: >60 mL/min (ref >60)
Glucose, Bld: 197 mg/dL — ABNORMAL HIGH (ref 70–99)
Potassium: 3.4 mmol/L — ABNORMAL LOW (ref 3.5–5.1)
Sodium: 137 mmol/L (ref 135–145)
Total Bilirubin: 0.9 mg/dL (ref 0.3–1.2)
Total Protein: 7 g/dL (ref 6.5–8.1)

## 2022-05-20 LAB — PROTIME-INR
INR: 6.3 (ref 0.8–1.2)
Prothrombin Time: 54.8 seconds — ABNORMAL HIGH (ref 11.4–15.2)

## 2022-05-20 LAB — CBG MONITORING, ED
Glucose-Capillary: 143 mg/dL — ABNORMAL HIGH (ref 70–99)
Glucose-Capillary: 197 mg/dL — ABNORMAL HIGH (ref 70–99)

## 2022-05-20 LAB — GLUCOSE, CAPILLARY
Glucose-Capillary: 189 mg/dL — ABNORMAL HIGH (ref 70–99)
Glucose-Capillary: 194 mg/dL — ABNORMAL HIGH (ref 70–99)

## 2022-05-20 LAB — MAGNESIUM: Magnesium: 2 mg/dL (ref 1.7–2.4)

## 2022-05-20 LAB — HEMOGLOBIN A1C
Hgb A1c MFr Bld: 6.8 % — ABNORMAL HIGH (ref 4.8–5.6)
Mean Plasma Glucose: 148.46 mg/dL

## 2022-05-20 LAB — PHOSPHORUS: Phosphorus: 2.5 mg/dL (ref 2.5–4.6)

## 2022-05-20 MED ORDER — METOPROLOL TARTRATE 12.5 MG HALF TABLET
12.5000 mg | ORAL_TABLET | Freq: Two times a day (BID) | ORAL | Status: DC
  Administered 2022-05-20 – 2022-05-27 (×15): 12.5 mg via ORAL
  Filled 2022-05-20 (×15): qty 1

## 2022-05-20 MED ORDER — FUROSEMIDE 20 MG PO TABS
20.0000 mg | ORAL_TABLET | Freq: Every day | ORAL | Status: DC
  Administered 2022-05-20 – 2022-05-21 (×2): 20 mg via ORAL
  Filled 2022-05-20 (×2): qty 1

## 2022-05-20 MED ORDER — LINAGLIPTIN 5 MG PO TABS
5.0000 mg | ORAL_TABLET | ORAL | Status: DC
  Administered 2022-05-21 – 2022-05-27 (×4): 5 mg via ORAL
  Filled 2022-05-20 (×4): qty 1

## 2022-05-20 NOTE — Addendum Note (Signed)
Encounter addended by: Hayden Pedro, PA-C on: 05/20/2022 10:45 AM  Actions taken: Clinical Note Signed, Follow-up modified

## 2022-05-20 NOTE — ED Notes (Signed)
Received pt from [facility] [place], RN= pt is awake/alert - denies pain at present-- requests some water= will check orders.

## 2022-05-20 NOTE — Progress Notes (Signed)
Telephone appointment. I spoke to [name], verified her identity and began nursing interview. Patient is currently in [facility] emergency dept for unrelated issues. No complaints of chest pains, or SOB. Patient has an occasional, mild cough, but this is not a new issue. No other lung related issues reported by patient's daughter at this time.  Meaningful use complete.  Reminded patient's daughter of his 8:30am-05/20/22 telephone appointment w/ Shona Simpson PA-C. I left my extension [phone] in case patient needs anything. Daughter expressed verbal understanding.  Patient contact [phone]

## 2022-05-20 NOTE — Addendum Note (Signed)
Encounter addended by: Hayden Pedro, PA-C on: 05/20/2022 9:54 AM  Actions taken: Pend clinical note

## 2022-05-20 NOTE — Progress Notes (Signed)
[employer] for warfarin Indication: Hx VTE  Allergies  Allergen Reactions   Haloperidol Lactate Other (See Comments)    "Made him climb the walls."   Sildenafil Other (See Comments)    "pain in right thigh."    Patient Measurements: Height: 5\' 8"  (172.7 cm) Weight: 83.9 kg (185 lb) IBW/kg (Calculated) : 68.4  Vital Signs: BP: 133/63 (06/20 0615) Pulse Rate: 85 (06/20 0615)  Labs: Recent Labs    05/19/22 1343 05/20/22 0404  HGB 14.8 14.2  HCT 45.8 44.8  PLT 259 247  LABPROT 49.3* 54.8*  INR 5.5* 6.3*  CREATININE 1.36* 1.11  CKTOTAL 137  --      Estimated Creatinine Clearance: 51.3 mL/min (by C-G formula based on SCr of 1.11 mg/dL).   Medical History: Past Medical History:  Diagnosis Date   Borderline diabetes    takes Metformin   Cancer ([place])    lung 2020 per spouse LUL   COPD (chronic obstructive pulmonary disease) ([place])    Diabetes mellitus without complication ([place])    DVT (deep venous thrombosis) ([place])    Full dentures    Hearing aid worn    B/L   HOH (hard of hearing)    Hypertension    Left upper lobe pulmonary nodule    Memory problem    Pneumonia    Schizo affective schizophrenia ([place])    Seizures ([place])    Assessment: 89 YOM presenting with leg swelling and fall, hx of PE/DVT on warfarin PTA, INR on admission supratherapeutic at 5.5.  CT with cholecystitis.   INR supratherapeutic at 6.3 on 6/20 AM after holding warfarin 6/19. CBC stable. No bleeding noted.   PTA regimen: 2 mg Sun/M/W/F; 3 mg T/Th/S   Goal of Therapy:  INR 2-3 Monitor platelets by anticoagulation protocol: Yes   Plan:  Hold warfarin 6/20 PM  Daily INR, s/s bleeding  Adria Dill, PharmD PGY-1 Acute Care Resident  05/20/2022 7:12 AM

## 2022-05-20 NOTE — ED Notes (Signed)
Assisted WOC RN to roll and place mepilex of coccyx-

## 2022-05-20 NOTE — Consult Note (Addendum)
[place] Nurse Consult Note: Reason for Consult: Consult requested for buttocks.  Pt is in the ED on a stretcher and is very difficult to turn and resists the 2 person assist. Inner buttock near gluteal fold with red moist partial thickness wound related to moisture associated skin damage, not pressure.   ICD-10 CM Codes for Irritant Dermatitis L24A0 - Due to friction or contact with body fluids; unspecified areas is approx 3X3X.1cm  Dressing procedure/placement/frequency: Topical treatment orders provided for bedside nurses to perform to protect from further injury: Foam dressing to bilat buttocks, change Q 3 days or PRN soiling Please re-consult if further assistance is needed.  Thank-you,  Julien Girt MSN, [place], [place], [place], [place]

## 2022-05-20 NOTE — Progress Notes (Signed)
PT Cancellation Note  Patient Details Name: Robert Fisher MRN: [id number] DOB: [birth year]   Cancelled Treatment:    Reason Eval/Treat Not Completed: Other (comment).  Pt is getting meds wth nursing, has INR of 5 and per nsg not ready for PT to move.  Follow up tomorrow.   Ramond Dial 05/20/2022, 11:09 AM  Mee Hives, PT PhD Acute Rehab Dept. Number: [place]

## 2022-05-20 NOTE — Progress Notes (Signed)
Radiation Oncology         [phone] ________________________________  Outpatient Follow Up - Conducted via telephone at patient request.  I spoke with the patient to conduct this consult visit via telephone to spare the patient unnecessary potential exposure in the healthcare setting during the current COVID-19 pandemic. The patient was notified in advance and was offered an in person or telemedicine meeting to allow for face to face communication but instead preferred to proceed with a telephone consult.    Name: Robert Fisher MRN: [id number]  Date of Service: 05/20/2022  DOB: 25-Dec-1933   Diagnosis:  Stage IA, NSCLC,  squamous cell carcinoma of the left upper lobe  Prior Radiation:    04/11/2019-04/18/2019 SBRT Treatement: The LUL was treated to 54 Gy in 3 fractions  Narrative:  In summary, this is a pleasant  88 y.o. gentleman with a  Stage IA, NSCLC, squamous cell carcinoma of the left upper lobe. Robert Fisher was noticing cough and weakness and on 12/06/2018 he underwent a CXR that showed mild patchy LUL opacity. He was treated with antibiotics and had a CT on 01/28/2019 that revealed a 15 x 18 x 22 mm solid spiculated mass in the LUL. He did have an 11 mm AP window node and a left hilar node measuring 9 mm. PET scan on 02/14/2019 revealed an SUV of 12.6 in the LUL nodule and no evidence of hypermetabolic mediastinal or hilar adenopathy. He had low level uptake in the left axilla but this was felt to be reactive. He did undergo bronchoscopy on 03/09/2019 which revealed a squamous cell carcinoma. He was not a good surgical candidate for resection.  Rather, he proceeded with 3 fractions of SBRT which were completed in May 2020.  Since completing treatment, he has had stable CT scans.   He continues to have gallstones, and a lesion in the uncinate process of the pancreas measuring 1.5 cm that was felt to be stable from a CT scan that was performed in 2020, and this has been recommended to be  followed in 2 years time with an abdominal CT with and without contrast at the time of a scan in the summer of 2021.  He is scheduled for CT scan yesterday however presented to the emergency department tonight complaining of a fall and subsequently was noted to have abdominal he did actually have a CT scan of the chest abdomen pelvis.  Interestingly he has features of cholecystitis but no noticeable changes on the uncinate process which we were following up on.  No evidence of concerns in the left lung are noted just stable postradiation effect, no adenopathy.  He is anticipating cholecystectomy with Dr. Zenia Resides but is awaiting his INR to result this morning to make decisions about how to proceed.  Given that his CT scan was already done no I did call his daughter to review this.  On review of systems, the patient has been having pain in his upper right abdomen, but is unavailable during the call to review this himself. His daughter states he has not had any complaints of chest pain, trouble breathing or fevers.  No other complaints are noted.    PAST MEDICAL HISTORY:  Past Medical History:  Diagnosis Date   Borderline diabetes    takes Metformin   Cancer ([place])    lung 2020 per spouse LUL   COPD (chronic obstructive pulmonary disease) ([place])    Diabetes mellitus without complication ([place])    DVT (deep venous thrombosis) (  [place])    Full dentures    Hearing aid worn    B/L   HOH (hard of hearing)    Hypertension    Left upper lobe pulmonary nodule    Memory problem    Pneumonia    Schizo affective schizophrenia ([place])    Seizures ([place])     PAST SURGICAL HISTORY: Past Surgical History:  Procedure Laterality Date   cancer removal     legs   CATARACT EXTRACTION     COLONOSCOPY     FUDUCIAL PLACEMENT N/A 03/09/2019   Procedure: PLACEMENT OF FUDUCIAL;  Surgeon: [name], MD;  Location: [facility];  Service: Thoracic;  Laterality: N/A;   THROMBECTOMY     VIDEO BRONCHOSCOPY WITH  ENDOBRONCHIAL NAVIGATION N/A 03/09/2019   Procedure: VIDEO BRONCHOSCOPY WITH ENDOBRONCHIAL NAVIGATION;  Surgeon: [name], MD;  Location: [facility];  Service: Thoracic;  Laterality: N/A;    PAST SOCIAL HISTORY:  Social History   Socioeconomic History   Marital status: Married    Spouse name: Not on file   Number of children: Not on file   Years of education: Not on file   Highest education level: Not on file  Occupational History   Not on file  Tobacco Use   Smoking status: Former    Packs/day: 1.00    Years: 50.00    Total pack years: 50.00    Types: Cigarettes, Pipe    Start date: 75    Quit date: 03/03/2015    Years since quitting: 7.2   Smokeless tobacco: Never   Tobacco comments:    30 yearsv cigarettes ; pipe 20 years  Vaping Use   Vaping Use: Never used  Substance and Sexual Activity   Alcohol use: No   Drug use: No   Sexual activity: Not Currently  Other Topics Concern   Not on file  Social History Narrative   Right handed    Lives with wife daughter is with him right now    Social Determinants of Health   Financial Resource Strain: Not on file  Food Insecurity: Not on file  Transportation Needs: Not on file  Physical Activity: Not on file  Stress: Not on file  Social Connections: Not on file  Intimate Partner Violence: Not on file  The patient is married and lives in [place].    PAST FAMILY HISTORY:History reviewed. No pertinent family history.  MEDICATIONS  No current facility-administered medications for this encounter.   Current Outpatient Medications  Medication Sig Dispense Refill   aspirin EC 81 MG tablet Take 81 mg by mouth at bedtime.     atorvastatin (LIPITOR) 40 MG tablet Take 40 mg by mouth at bedtime.  2   buPROPion (WELLBUTRIN) 100 MG tablet Take 100 mg by mouth in the morning.     clonazePAM (KLONOPIN) 0.5 MG tablet Take 0.25 mg by mouth in the morning and at bedtime.     Emollient (CERAVE MOISTURIZING EX) Apply 1  application  topically See admin instructions. Apply to bilateral toes-to-calves daily     EX-LAX 15 MG CHEW Chew 15 mg by mouth daily as needed (for constipation).     fenofibrate 160 MG tablet Take 160 mg by mouth daily.     furosemide (LASIX) 20 MG tablet Take 20 mg by mouth See admin instructions. Take 20 mg by mouth in the morning on Mon/Tues/Wed/Fri     hydrochlorothiazide (HYDRODIURIL) 12.5 MG tablet Take 12.5 mg by mouth daily.  hydrocortisone cream 1 % Apply 1 application  topically daily as needed (for red patches on the body).     levETIRAcetam (KEPPRA) 500 MG tablet Take 1/2 tablet twice a day (Patient taking differently: Take 250 mg by mouth in the morning and at bedtime.) 90 tablet 3   linagliptin (TRADJENTA) 5 MG TABS tablet Take 5 mg by mouth every other day.     metFORMIN (GLUCOPHAGE) 500 MG tablet Take 500 mg by mouth in the morning and at bedtime.     metoprolol tartrate (LOPRESSOR) 25 MG tablet Take 25 mg by mouth daily.      Multiple Vitamins-Minerals (MULTIVITAMIN GUMMIES MENS) CHEW Chew 1 tablet by mouth daily.     NON FORMULARY Take 1 tablet by mouth See admin instructions. Calcium 500 mg + vitamin D-3 1,000 units tablets- Take 1 tablet by mouth once a day     OLANZapine (ZYPREXA) 10 MG tablet Take 20 mg by mouth at bedtime.     Omega-3 Fatty Acids (FISH OIL PO) Take 1,400 mg by mouth daily.     Polyethyl Glycol-Propyl Glycol (SYSTANE FREE OP) Place 1 drop into both eyes 3 (three) times daily as needed (for dryness).     potassium chloride SA (KLOR-CON) 20 MEQ tablet Take 20 mEq by mouth See admin instructions. Take TWO HALVES of one 20 mEq tablet by mouth every morning to equal a total dose of 20 mEq     Probiotic Product (PROBIOTIC PO) Take 1 capsule by mouth in the morning.     TYLENOL 8 HOUR ARTHRITIS PAIN 650 MG CR tablet Take 1,300 mg by mouth in the morning and at bedtime.     warfarin (COUMADIN) 2 MG tablet Take 2-3 mg by mouth See admin instructions. Take 2 mg by  mouth at bedtime on Sun/Mon/Wed/Fri and 3 mg on Tues/Thurs/Sat     Facility-Administered Medications Ordered in Other Encounters  Medication Dose Route Frequency Provider Last Rate Last Admin   acetaminophen (TYLENOL) tablet 650 mg  650 mg Oral Q6H PRN [place] Crane, Carole N, DO       clonazepam (KLONOPIN) disintegrating tablet 0.25 mg  0.25 mg Oral BID Hall, Carole N, DO   0.25 mg at 05/19/22 2244   HYDROmorphone (DILAUDID) injection 0.5 mg  0.5 mg Intravenous Q4H PRN Irene Pap N, DO       insulin aspart (novoLOG) injection 0-5 Units  0-5 Units Subcutaneous QHS Hall, Carole N, DO       insulin aspart (novoLOG) injection 0-9 Units  0-9 Units Subcutaneous TID WC Hall, Carole N, DO       levETIRAcetam (KEPPRA) tablet 250 mg  250 mg Oral BID Irene Pap N, DO   250 mg at 05/19/22 2243   LORazepam (ATIVAN) injection 2 mg  2 mg Intravenous Q6H PRN Irene Pap N, DO       melatonin tablet 5 mg  5 mg Oral QHS PRN Irene Pap N, DO       OLANZapine (ZYPREXA) tablet 20 mg  20 mg Oral QHS Hall, Carole N, DO   20 mg at 05/19/22 2243   oxyCODONE (Oxy IR/ROXICODONE) immediate release tablet 5 mg  5 mg Oral Q6H PRN Irene Pap N, DO       piperacillin-tazobactam (ZOSYN) IVPB 3.375 g  3.375 g Intravenous Q8H Hall, Carole N, DO   Stopped at 05/20/22 0735   polyethylene glycol (MIRALAX / GLYCOLAX) packet 17 g  17 g Oral Daily PRN Kayleen Memos, DO  senna-docusate (Senokot-S) tablet 1 tablet  1 tablet Oral QHS Irene Pap N, DO   1 tablet at 05/19/22 2244   Warfarin - Pharmacist Dosing Inpatient   Does not apply q1600 Bertis Ruddy, [facility]        ALLERGIES:  Allergies  Allergen Reactions   Haloperidol Lactate Other (See Comments)    "Made him climb the walls."   Sildenafil Other (See Comments)    "pain in right thigh."   Physical Exam: Unable to assess due to encounter type.   Impression/Plan: 1. Stage IA, NSCLC,  squamous cell carcinoma of the left upper lobe.  The patient appears to be doing  well radiographically in the lungs regarding his history of lung cancer.  As he still remains functional and has good performance, he is still a good candidate for surveillance.  We discussed continued follow-up at 75-month intervals with CT imaging.   2. Lesion in the uncinate process of the pancreas. This was not noted during his CT scan from yesterday evening but I messaged Dr. Zenia Resides who's involved in his care as he may have cholecystectomy.  Given that this was not noted on his scan however we will only repeat imaging of the abdomen if he becomes symptomatic. 3. Cholecystitis. He will continue under the care of his inpatient care team and surgical team.   This encounter was conducted via telephone.  The patient's daughter has provided two factor identification and has given verbal consent for this type of encounter and has been advised to only accept a meeting of this type in a secure network environment. The time spent during this encounter was 30 minutes including preparation, discussion, and coordination of the patient's care. The attendants for this meeting include  Robert Fisher  and Robert Fisher's daughter Robert Fisher.  During the encounter,  Robert Fisher was located at [facility] Radiation Oncology Department.  Robert Fisher was located at [facility], but his daughter Robert Fisher whom I spoke with was at home.       Carola Rhine, PAC

## 2022-05-20 NOTE — ED Notes (Signed)
Patient pulled out IV. RN made aware. No bleeding was noted.

## 2022-05-20 NOTE — Progress Notes (Signed)
Pt admitted to 6N13 from ED via stretcher. Skin OK, foam on sacral area which is reddened stage 1. [facility] nurse Julien Girt has seen it.  Pt has upper and lower dentures, sleepy at present.

## 2022-05-20 NOTE — Progress Notes (Signed)
Patient ID: Robert Fisher, male   DOB: July 16, 1934, 88 y.o.   MRN: [id number] [facility] Main Surgery Progress Note     Subjective: CC-  Having mild RUQ pain. Denies n/v.  WBC 9, VSS. Transaminases stable elevated. INR up to 6.3  Objective: Vital signs in last 24 hours: Temp:  [97.9 F (36.6 C)-98.7 F (37.1 C)] 98.7 F (37.1 C) (06/20 1217) Pulse Rate:  [75-99] 93 (06/20 1217) Resp:  [15-22] 22 (06/20 1217) BP: (107-160)/(50-88) 121/73 (06/20 1217) SpO2:  [91 %-98 %] 95 % (06/20 1217) Weight:  [83.9 kg] 83.9 kg (06/19 1312)    Intake/Output from previous day: No intake/output data recorded. Intake/Output this shift: Total I/O In: 50 [IV Piggyback:50] Out: -   PE: Gen:  Alert, NAD, pleasant Card: irregular Pulm:  rate and effort normal on room air Abd: Soft, mild RUQ TTP without rebound or guarding  Lab Results:  Recent Labs    05/19/22 1343 05/20/22 0404  WBC 9.9 9.0  HGB 14.8 14.2  HCT 45.8 44.8  PLT 259 247   BMET Recent Labs    05/19/22 1343 05/20/22 0404  NA 139 137  K 3.6 3.4*  CL 102 101  CO2 26 25  GLUCOSE 179* 197*  BUN 17 13  CREATININE 1.36* 1.11  CALCIUM 9.6 9.3   PT/INR Recent Labs    05/19/22 1343 05/20/22 0404  LABPROT 49.3* 54.8*  INR 5.5* 6.3*   CMP     Component Value Date/Time   NA 137 05/20/2022 0404   K 3.4 (L) 05/20/2022 0404   CL 101 05/20/2022 0404   CO2 25 05/20/2022 0404   GLUCOSE 197 (H) 05/20/2022 0404   BUN 13 05/20/2022 0404   CREATININE 1.11 05/20/2022 0404   CREATININE 1.26 (H) 07/11/2021 1412   CALCIUM 9.3 05/20/2022 0404   PROT 7.0 05/20/2022 0404   ALBUMIN 2.8 (L) 05/20/2022 0404   AST 155 (H) 05/20/2022 0404   ALT 163 (H) 05/20/2022 0404   ALKPHOS 61 05/20/2022 0404   BILITOT 0.9 05/20/2022 0404   GFRNONAA >60 05/20/2022 0404   GFRNONAA 56 (L) 07/11/2021 1412   GFRAA 58 (L) 06/22/2020 1355   Lipase     Component Value Date/Time   LIPASE 29 05/19/2022 1343        Studies/Results: CT CHEST ABDOMEN PELVIS W CONTRAST  Result Date: 05/19/2022 CLINICAL DATA:  RIGHT lower quadrant pain EXAM: CT CHEST, ABDOMEN, AND PELVIS WITH CONTRAST TECHNIQUE: Multidetector CT imaging of the chest, abdomen and pelvis was performed following the standard protocol during bolus administration of intravenous contrast. RADIATION DOSE REDUCTION: This exam was performed according to the departmental dose-optimization program which includes automated exposure control, adjustment of the mA and/or kV according to patient size and/or use of iterative reconstruction technique. CONTRAST:  144mL OMNIPAQUE IOHEXOL 300 MG/ML  SOLN COMPARISON:  Chest CT 11/14/2021 FINDINGS: CT CHEST FINDINGS Cardiovascular: No significant vascular findings. Normal heart size. No pericardial effusion. Mediastinum/Nodes: No axillary or supraclavicular adenopathy. No mediastinal or hilar adenopathy. No pericardial fluid. Esophagus normal. Lungs/Pleura: Angular band of dense atelectasis in the LEFT upper lobe is not changed. No suspicious pulmonary nodules. Musculoskeletal: No aggressive osseous lesion. CT ABDOMEN AND PELVIS FINDINGS Hepatobiliary: Liver is normal. The gallbladder is distended to 5.3 cm. Gallbladder wall is thickened and there is inflammatory stranding surrounding the gallbladder. There are several dense gallstones towards the neck of the gallbladder. Common bile duct normal caliber. No intrahepatic duct dilatation. Pancreas: No pancreatic inflammation.  Pancreatic duct  normal. Spleen: Normal spleen Adrenals/urinary tract: Adrenal glands are normal. Kidneys, ureters and bladder normal. Stomach/Bowel: Stomach, small bowel, appendix, and cecum are normal. The colon and rectosigmoid colon are normal. Vascular/Lymphatic: Abdominal aorta is normal caliber. There is no retroperitoneal or periportal lymphadenopathy. No pelvic lymphadenopathy. Reproductive: Prostate unremarkable Other: Fall amount of fluid  along the RIGHT pericolic gutter related to the presumed gallbladder inflammation. Musculoskeletal: No aggressive osseous lesion. IMPRESSION: Chest Impression: 1. No acute findings in chest. 2. Stable consolidation /  atelectasis in the LEFT upper lobe. Abdomen / Pelvis Impression: 1. CT findings consistent with acute cholecystitis. 2. No bowel obstruction or ureteral obstruction. Electronically Signed   By: Suzy Bouchard M.D.   On: 05/19/2022 18:43   DG Foot 2 Views Left  Result Date: 05/19/2022 CLINICAL DATA:  Bilateral foot pain for 1 day after falling out of bed. EXAM: LEFT FOOT - 2 VIEW COMPARISON:  None Available. FINDINGS: There is no evidence of fracture or dislocation. There is no evidence of arthropathy or other focal bone abnormality. There is dorsal soft tissue swelling overlying the metacarpal bones. IMPRESSION: 1. No acute bone abnormality. 2. Dorsal soft tissue swelling. Electronically Signed   By: Kerby Moors M.D.   On: 05/19/2022 17:49   DG Foot 2 Views Right  Result Date: 05/19/2022 CLINICAL DATA:  Fall. Pain. Bilateral leg and foot pain. History of prior right leg surgery. EXAM: RIGHT FOOT - 2 VIEW COMPARISON:  None available FINDINGS: Mildly decreased bone mineralization. Small plantar calcaneal heel spur. Mild hammertoe angulation of all 5 digits. Mild great toe metatarsophalangeal joint space narrowing. No acute fracture is seen. No dislocation. Mild vascular calcifications. IMPRESSION: Mild great toe metatarsophalangeal joint osteoarthritis. Small plantar calcaneal heel spur. Electronically Signed   By: Yvonne Kendall M.D.   On: 05/19/2022 17:48   DG Tibia/Fibula Left  Result Date: 05/19/2022 CLINICAL DATA:  fall, pain EXAM: LEFT TIBIA AND FIBULA - 2 VIEW COMPARISON:  None available FINDINGS: There is no evidence of fracture or other focal bone lesions. 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 Visualized ankle grossly unremarkable. Soft tissues are  unremarkable. Vascular calcifications. IMPRESSION: 1. No acute displaced fracture or dislocation. 2. 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 Electronically Signed   By: Iven Finn M.D.   On: 05/19/2022 17:46   DG Tibia/Fibula Right  Result Date: 05/19/2022 CLINICAL DATA:  fall, pain EXAM: RIGHT TIBIA AND FIBULA - 2 VIEW COMPARISON:  None Available. FINDINGS: There is no evidence of fracture or other focal bone lesions. Soft tissues are unremarkable. Tricompartmental severe degenerative changes of the visualized knee. Partially visualized ankle grossly unremarkable. Vascular clips overlie small leg. IMPRESSION: 1. No acute displaced fracture or dislocation. 2. Tricompartmental severe degenerative changes of the visualized knee. Electronically Signed   By: Iven Finn M.D.   On: 05/19/2022 17:45   CT HEAD WO CONTRAST (5MM)  Result Date: 05/19/2022 CLINICAL DATA:  Trauma EXAM: CT HEAD WITHOUT CONTRAST TECHNIQUE: Contiguous axial images were obtained from the base of the skull through the vertex without intravenous contrast. RADIATION DOSE REDUCTION: This exam was performed according to the departmental dose-optimization program which includes automated exposure control, adjustment of the mA and/or kV according to patient size and/or use of iterative reconstruction technique. COMPARISON:  04/03/2022 FINDINGS: Brain: No acute findings are seen in noncontrast CT brain. There are no signs of bleeding within the cranium. Cortical sulci are prominent. There is decreased density in the periventricular and subcortical white matter.  Vascular: There are scattered arterial calcifications. Skull: Unremarkable. Sinuses/Orbits: Unremarkable. Other: No significant interval changes are noted. IMPRESSION: No acute intracranial findings are seen in noncontrast CT brain. Atrophy. Small-vessel disease. Electronically Signed   By: Elmer Picker M.D.   On: 05/19/2022 14:51   DG Chest 2  View  Result Date: 05/19/2022 CLINICAL DATA:  Lower extremity edema.  History of lung cancer. EXAM: CHEST - 2 VIEW COMPARISON:  Chest radiograph 03/29/2022 FINDINGS: The cardiomediastinal silhouette is within normal limits. Left upper lobe fiducial markers and surrounding parenchymal opacity are unchanged and related to previously treated lung cancer. There is associated left upper lobe volume loss. The right lung is clear. No sizable pleural effusion or pneumothorax is identified. No acute osseous abnormality is seen. IMPRESSION: No evidence of active cardiopulmonary disease. Electronically Signed   By: Logan Bores M.D.   On: 05/19/2022 14:14    Anti-infectives: Anti-infectives (From admission, onward)    Start     Dose/Rate Route Frequency Ordered Stop   05/19/22 2200  piperacillin-tazobactam (ZOSYN) IVPB 3.375 g        3.375 g 12.5 mL/hr over 240 Minutes Intravenous Every 8 hours 05/19/22 2016     05/19/22 1930  piperacillin-tazobactam (ZOSYN) IVPB 3.375 g        3.375 g 100 mL/hr over 30 Minutes Intravenous  Once 05/19/22 1919 05/19/22 2018        Assessment/Plan  Acute cholecystitis  - Patient with signs of acute cholecystitis on imaging and stable elevated transaminases. Given his age, fraility, and multiple comorbidities recommending IR consult for percutaneous cholecystostomy tube placement. His INR is supratherapeutic and will likely need to be corrected prior to any procedure. Continue IV antibiotics.  ID - zosyn 6/19>> FEN - regular diet VTE - SCDs, INR supratherapeutic, hold coumadin Foley - none  HTN CHF Hx DVT/PE on coumadin DM CKD2 HLD Seizure disorder Paranoid schizophrenia  I reviewed hospitalist notes, last 24 h vitals and pain scores, last 48 h intake and output, last 24 h labs and trends, and last 24 h imaging results   LOS: 1 day    Wellington Hampshire, [facility] 05/20/2022, 12:41 PM Please see Amion for pager number during day hours  7:00am-4:30pm

## 2022-05-20 NOTE — ED Notes (Signed)
MD [place] Crane notified of critical INR value

## 2022-05-20 NOTE — ED Notes (Signed)
Family at bedside. 

## 2022-05-20 NOTE — Progress Notes (Addendum)
PROGRESS NOTE  BOSTON COOKSON  DOB: [birth year]  PCP: Shon Baton, MD RZN:[id number]  DOA: 05/19/2022  LOS: 1 day  Hospital Day: 2  Brief narrative: Robert Fisher is a 88 y.o. male with PMH significant for DM2, HTN, HLD, DVT/PE on Coumadin, chronic bilateral lower extremity edema on furosemide 4 times a week and HCTZ daily, former tobacco user 100 pack years, quit 6 years ago, COPD, lung cancer, paranoid schizophrenia, seizure disorder followed by neurology. Patient was brought to the ED on 6/19 with complaint of progressively worsening bilateral lower extremity edema despite compliance with diuretics at home.  He also had progressive global weakness leading to a fall from his wheelchair on the morning of presentation.  Also endorsed intermittent right upper quadrant abdominal pain without nausea or vomiting.  In the ED, patient was afebrile, hemodynamically stable Labs with CBC unremarkable, BUN/creatinine 70/1.36, slightly elevated liver enzymes. CT abdomen showed gallstones, acute cholecystitis without bile duct dilatation. INR supratherapeutic at 5.5 on Coumadin Patient was started on IV Zosyn Admitted to hospitalist service General surgery consulted.  Subjective: Patient was seen and examined this morning.  Pleasant elderly Caucasian male.  Lying down in bed.  Mostly somnolent.  Opens eyes in intervals to answer simple questions.  Oriented to place.  Daughter and another family member at bedside. Per family, patient has been progressively declining physically and mentally for last several weeks.  He is now in wheelchair mostly but was able to propel it by moving his legs until recently. Chart reviewed Hemodynamically stable Labs this morning with potassium low at 3.4, INR further elevated at 6.3, A1c 6.8  Principal Problem:   Acute cholecystitis    Assessment and plan: Acute cholecystitis and cholelithiasis -Presented with right upper quadrant abdominal pain  -No fever,  no leukocytes but elevated liver enzymes without CBD dilation  -Currently on IV antibiotics  -General surgery consulted.  Recommended cholecystostomy tube placement by IR. -Continue pain meds   Elevated liver enzymes -AST/ALT elevated.  Alk phos and bilirubin normal. -CT scan with normal liver morphology. -Obtain acute hepatitis panel for completion of study -Keep statin on hold. Recent Labs  Lab 05/19/22 1343 05/20/22 0404  AST 166* 155*  ALT 151* 163*  ALKPHOS 57 61  BILITOT 0.7 0.9  PROT 6.6 7.0  ALBUMIN 2.9* 2.8*    Chronic bilateral lower extremity edema Essential hypertension Chronic diastolic CHF -Patient reports grossly worsening bilateral lower extremity swelling despite compliance to diuretics.   -No respiratory symptoms.   -PTA on furosemide 20 mg 4 times a week and on HCTZ 12.5 mg daily, Lopressor 25 mg daily -Last echo from May 2023 with normal LV function, and grade 2 diastolic dysfunction, normal pulmonary artery systolic pressure -Currently diuretics on hold.  Blood pressure is in normal range.  Creatinine is better this morning.  I would resume patient on Lopressor 12.5 mg twice daily and Lasix 20 mg daily at this time. 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  It sounds like he is having frequent falls at home.  I would recommend against resuming Coumadin.  Family agreeable. -INR supratherapeutic at presentation.  Continue to monitor. -Continue monitor Recent Labs  Lab 05/19/22 1343 05/20/22 0404  INR 5.5* 6.3*   Type 2 diabetes mellitus -A1c 6.8 on 05/20/2022 -PTA on Tradjenta 5 mg daily, metformin 500 mg twice daily, -  Currently on sliding scale insulin with Accu-Cheks -Resume Tradjenta.  Keep metformin on hold. Recent Labs  Lab 05/19/22 2133 05/20/22 0723 05/20/22 1057  GLUCAP 180* 143* 197*     CKD 2 -Baseline creatinine appears to be less than 1.3.  Slightly elevated at presentation.  Better this morning.  -Continue to monitor Recent Labs    07/11/21 1412 07/30/21 1349 08/08/21 1445 11/14/21 1418 03/28/22 1906 03/29/22 1721 04/03/22 1129 05/19/22 1343 05/20/22 0404  BUN $Re'19 23 18  'AxL$ --  19 16 24* 17 13  CREATININE 1.26* 1.25* 1.32* 1.30* 1.19 1.14 1.22 1.36* 1.11    Hyperlipidemia -PTA on Lipitor and fenofibrate.  Currently on hold because of elevated liver enzymes  -Keep aspirin on hold while waiting for procedure.  Seizure disorder -Continue Keppra to 50 mg twice daily -Seizure precautions   Paranoid schizophrenia -PTA on Wellbutrin 100 mg daily, Klonopin 0.25 mg twice daily, Zyprexa 20 mg daily at bedtime -Currently on hold..  Can resume if needed based on mental status changes.    Constipation -Reported no bowel movement for days -Bowel regimen in place.   Sacral decubitus pressure ulcer, POA -Wound care specialist consulted -Continue local wound care   Ambulatory dysfunction, uses a wheelchair and a cane intermittently -PT OT evaluation when clinically stable -Fall precautions    Goals of care   Code Status: Partial Code  We discussed about this with patient's daughter at bedside who became tearful with the question and is unable to make a decision at this time.  I would continue partial CODE STATUS at this time  Mobility: Needs PT eval  Skin assessment:     Nutritional status:  Body mass index is 28.13 kg/m.          Diet:  Diet Order             Diet regular Room service appropriate? Yes; Fluid consistency: Thin  Diet effective now                   DVT prophylaxis:  Place and maintain sequential compression device Start: 05/20/22 1155 SCDs Start: 05/19/22 1955   Antimicrobials: Zosyn, Flagyl Fluid: None Consultants: General surgery Family Communication: Daughter at bedside  Status is: Inpatient  Continue in-hospital  care because: Requiring IV antibiotics, may need cholecystectomy versus cholecystostomy Level of care: Telemetry Surgical   Dispo: The patient is from: Home              Anticipated d/c is to: Pending clinical course              Patient currently is not medically stable to d/c.   Difficult to place patient No     Infusions:   piperacillin-tazobactam Stopped (05/20/22 0735)    Scheduled Meds:  clonazepam  0.25 mg Oral BID   furosemide  20 mg Oral Daily   insulin aspart  0-5 Units Subcutaneous QHS   insulin aspart  0-9 Units Subcutaneous TID WC   levETIRAcetam  250 mg Oral BID   [START ON 05/21/2022] linagliptin  5 mg Oral QODAY   metoprolol tartrate  12.5 mg Oral BID   OLANZapine  20 mg Oral QHS   senna-docusate  1 tablet Oral QHS   Warfarin - Pharmacist Dosing Inpatient   Does not apply q1600    PRN meds: acetaminophen, HYDROmorphone (DILAUDID) injection, LORazepam, melatonin, oxyCODONE, polyethylene glycol   Antimicrobials: Anti-infectives (From admission, onward)    Start     Dose/Rate Route Frequency Ordered Stop  05/19/22 2200  piperacillin-tazobactam (ZOSYN) IVPB 3.375 g        3.375 g 12.5 mL/hr over 240 Minutes Intravenous Every 8 hours 05/19/22 2016     05/19/22 1930  piperacillin-tazobactam (ZOSYN) IVPB 3.375 g        3.375 g 100 mL/hr over 30 Minutes Intravenous  Once 05/19/22 1919 05/19/22 2018       Objective: Vitals:   05/20/22 1217 05/20/22 1330  BP: 121/73 138/69  Pulse: 93 88  Resp: (!) 22 17  Temp: 98.7 F (37.1 C) 98.2 F (36.8 C)  SpO2: 95% 95%    Intake/Output Summary (Last 24 hours) at 05/20/2022 1405 Last data filed at 05/20/2022 0735 Gross per 24 hour  Intake 50 ml  Output --  Net 50 ml   Filed Weights   05/19/22 1312  Weight: 83.9 kg   Weight change:  Body mass index is 28.13 kg/m.   Physical Exam: General exam: Pleasant elderly Caucasian male.  Not in pain Skin: No rashes, lesions or ulcers. HEENT: Atraumatic,  normocephalic, no obvious bleeding Lungs: Clear to auscultation bilaterally CVS: Regular rate and rhythm, no murmur GI/Abd soft, mild tenderness in right upper quadrant.  Bowel sound present CNS: Mostly sleepy, opens eyes and verbal, answers simple questions Psychiatry: Mood appropriate Extremities: Pedal edema 1+ bilaterally  Data Review: I have personally reviewed the laboratory data and studies available.  F/u labs ordered Unresulted Labs (From admission, onward)     Start     Ordered   05/21/22 0500  Hepatitis panel, acute  Tomorrow morning,   R        05/20/22 1152   05/21/22 7673  Basic metabolic panel  Daily,   R      05/20/22 1154   05/21/22 0500  CBC with Differential/Platelet  Daily,   R      05/20/22 1154   05/20/22 0500  Protime-INR  Daily,   R      05/19/22 2008            SignedTerrilee Croak, MD [facility] 05/20/2022

## 2022-05-20 NOTE — ED Notes (Signed)
Report given to Sharee Pimple, RN on 6N

## 2022-05-20 NOTE — ED Notes (Signed)
Lanney Gins Daughter--- [phone] Lois Huxley -- Son in law -- [phone]  Please call with any concerns  Pt will need assistance with feeding if daughter is not at the bedside.

## 2022-05-21 ENCOUNTER — Encounter (HOSPITAL_COMMUNITY): Payer: Self-pay | Admitting: Internal Medicine

## 2022-05-21 DIAGNOSIS — R791 Abnormal coagulation profile: Secondary | ICD-10-CM

## 2022-05-21 DIAGNOSIS — I10 Essential (primary) hypertension: Secondary | ICD-10-CM

## 2022-05-21 DIAGNOSIS — E119 Type 2 diabetes mellitus without complications: Secondary | ICD-10-CM

## 2022-05-21 DIAGNOSIS — K810 Acute cholecystitis: Secondary | ICD-10-CM | POA: Diagnosis not present

## 2022-05-21 DIAGNOSIS — R7989 Other specified abnormal findings of blood chemistry: Secondary | ICD-10-CM | POA: Diagnosis not present

## 2022-05-21 DIAGNOSIS — G40909 Epilepsy, unspecified, not intractable, without status epilepticus: Secondary | ICD-10-CM

## 2022-05-21 DIAGNOSIS — N182 Chronic kidney disease, stage 2 (mild): Secondary | ICD-10-CM

## 2022-05-21 DIAGNOSIS — E1122 Type 2 diabetes mellitus with diabetic chronic kidney disease: Secondary | ICD-10-CM

## 2022-05-21 DIAGNOSIS — R600 Localized edema: Secondary | ICD-10-CM | POA: Diagnosis not present

## 2022-05-21 DIAGNOSIS — R569 Unspecified convulsions: Secondary | ICD-10-CM

## 2022-05-21 DIAGNOSIS — K5900 Constipation, unspecified: Secondary | ICD-10-CM

## 2022-05-21 HISTORY — DX: Unspecified convulsions: R56.9

## 2022-05-21 LAB — CBC WITH DIFFERENTIAL/PLATELET
Abs Immature Granulocytes: 0.04 10*3/uL (ref 0.00–0.07)
Basophils Absolute: 0 10*3/uL (ref 0.0–0.1)
Basophils Relative: 0 %
Eosinophils Absolute: 0.2 10*3/uL (ref 0.0–0.5)
Eosinophils Relative: 2 %
HCT: 46.5 % (ref 39.0–52.0)
Hemoglobin: 15.2 g/dL (ref 13.0–17.0)
Immature Granulocytes: 0 %
Lymphocytes Relative: 12 %
Lymphs Abs: 1.1 10*3/uL (ref 0.7–4.0)
MCH: 29.9 pg (ref 26.0–34.0)
MCHC: 32.7 g/dL (ref 30.0–36.0)
MCV: 91.5 fL (ref 80.0–100.0)
Monocytes Absolute: 0.9 10*3/uL (ref 0.1–1.0)
Monocytes Relative: 10 %
Neutro Abs: 6.9 10*3/uL (ref 1.7–7.7)
Neutrophils Relative %: 76 %
Platelets: 252 10*3/uL (ref 150–400)
RBC: 5.08 MIL/uL (ref 4.22–5.81)
RDW: 15.2 % (ref 11.5–15.5)
WBC: 9.1 10*3/uL (ref 4.0–10.5)
nRBC: 0 % (ref 0.0–0.2)

## 2022-05-21 LAB — BASIC METABOLIC PANEL
Anion gap: 11 (ref 5–15)
BUN: 12 mg/dL (ref 8–23)
CO2: 27 mmol/L (ref 22–32)
Calcium: 9.1 mg/dL (ref 8.9–10.3)
Chloride: 100 mmol/L (ref 98–111)
Creatinine, Ser: 1.14 mg/dL (ref 0.61–1.24)
GFR, Estimated: >60 mL/min (ref >60)
Glucose, Bld: 150 mg/dL — ABNORMAL HIGH (ref 70–99)
Potassium: 3.4 mmol/L — ABNORMAL LOW (ref 3.5–5.1)
Sodium: 138 mmol/L (ref 135–145)

## 2022-05-21 LAB — HEPATITIS PANEL, ACUTE
HCV Ab: NONREACTIVE
Hep A IgM: NONREACTIVE
Hep B C IgM: NONREACTIVE
Hepatitis B Surface Ag: NONREACTIVE

## 2022-05-21 LAB — GLUCOSE, CAPILLARY
Glucose-Capillary: 177 mg/dL — ABNORMAL HIGH (ref 70–99)
Glucose-Capillary: 151 mg/dL — ABNORMAL HIGH (ref 70–99)
Glucose-Capillary: 164 mg/dL — ABNORMAL HIGH (ref 70–99)
Glucose-Capillary: 158 mg/dL — ABNORMAL HIGH (ref 70–99)

## 2022-05-21 LAB — PROTIME-INR
INR: 6.7 (ref 0.8–1.2)
Prothrombin Time: 57.5 seconds — ABNORMAL HIGH (ref 11.4–15.2)

## 2022-05-21 MED ORDER — HYDROCORTISONE 1 % EX CREA
1.0000 | TOPICAL_CREAM | Freq: Every day | CUTANEOUS | Status: DC | PRN
Start: 2022-05-21 — End: 2022-05-27

## 2022-05-21 MED ORDER — BUPROPION HCL 100 MG PO TABS
100.0000 mg | ORAL_TABLET | Freq: Every morning | ORAL | Status: DC
  Administered 2022-05-22 – 2022-05-27 (×6): 100 mg via ORAL
  Filled 2022-05-21 (×8): qty 1

## 2022-05-21 MED ORDER — SODIUM CHLORIDE 0.9 % IV SOLN: INTRAVENOUS | Status: DC

## 2022-05-21 MED ORDER — POTASSIUM CHLORIDE CRYS ER 10 MEQ PO TBCR
40.0000 meq | EXTENDED_RELEASE_TABLET | Freq: Once | ORAL | Status: AC
  Administered 2022-05-21: 40 meq via ORAL
  Filled 2022-05-21: qty 4

## 2022-05-21 MED ORDER — FUROSEMIDE 20 MG PO TABS
20.0000 mg | ORAL_TABLET | Freq: Every day | ORAL | Status: DC
  Administered 2022-05-23 – 2022-05-27 (×5): 20 mg via ORAL
  Filled 2022-05-21 (×5): qty 1

## 2022-05-21 NOTE — Plan of Care (Signed)
  Problem: Metabolic: Goal: Ability to maintain appropriate glucose levels will improve Outcome: Progressing   Problem: Activity: Goal: Risk for activity intolerance will decrease Outcome: Not Progressing   Problem: Safety: Goal: Ability to remain free from injury will improve Outcome: Progressing

## 2022-05-21 NOTE — Evaluation (Addendum)
Physical Therapy Evaluation Patient Details Name: Robert Fisher MRN: [id number] DOB: 1934/07/03 Today's Date: 05/21/2022  History of Present Illness  Pt is 88 y/o male who presented to [facility] ED from home due to progressively worsening bilateral lower extremity edema despite taking home diuretics, also reports fall from w/c. PMH includes: HTN, type 2 diabetes, hyperlipidemia, DVT/PE on Coumadin, chronic bilateral lower extremity edema on furosemide 4 times a week and HCTZ daily, COPD, CKD 2, lung cancer, paranoid schizophrenia, and seizure disorder followed by neurology.  Clinical Impression  Pt agreeable to physical therapy evaluation/treatment session. Pt cleared for mobility by MD and nurse (high INR present). Pt's son-in-law present during evaluation and provided history due to pt's limited ability to communicate. Pt requiring moderate assistance x 2 for bed mobility (transfers deferred today). Pt is at a high risk for decline in functional mobility and his independence due to multiple factors including his age, comorbidities, and lack of activity. Pt currently presents with functional limitations secondary to impairments listed in PT problem list. Pt to benefit from skilled, acute care physical therapy interventions to maximize pt's independence level and quality of life.       Recommendations for follow up therapy are one component of a multi-disciplinary discharge planning process, led by the attending physician.  Recommendations may be updated based on patient status, additional functional criteria and insurance authorization.  Follow Up Recommendations Skilled nursing-short term rehab (<3 hours/day) Can patient physically be transported by private vehicle: No    Assistance Recommended at Discharge Frequent or constant Supervision/Assistance  Patient can return home with the following  Two people to help with walking and/or transfers;A lot of help with bathing/dressing/bathroom;Assistance  with cooking/housework;Assistance with feeding;Assist for transportation;Direct supervision/assist for financial management;Direct supervision/assist for medications management;Help with stairs or ramp for entrance    Equipment Recommendations Wheelchair (measurements PT);Wheelchair cushion (measurements PT) (if to go home with 24/7 assist)  Recommendations for Other Services       Functional Status Assessment Patient has had a recent decline in their functional status and/or demonstrates limited ability to make significant improvements in function in a reasonable and predictable amount of time     Precautions / Restrictions Precautions Precautions: Fall Precaution Comments: h/o seizure 8-9 months ago per son in law; partial-thickness wound to buttocks; high INR Restrictions Weight Bearing Restrictions: No      Mobility  Bed Mobility Overal bed mobility: Needs Assistance Bed Mobility: Supine to Sit, Sit to Supine, Rolling Rolling: Min assist, +2 for physical assistance   Supine to sit: Mod assist, +2 for physical assistance, HOB elevated Sit to supine: Mod assist, +2 for physical assistance   General bed mobility comments: Pt required assist to advance BLE and pivot hips to full EOB position, truncal support (pt was able to initiate scooting towards EOB and to [facility] with cues although sling pad utilized). Pt maintained static sitting balance for several minutes.    Transfers                   General transfer comment: deferred 2/2 high INR and high fall risk    Ambulation/Gait               General Gait Details: deferred 2/2 high INR and high fall risk  Stairs            Wheelchair Mobility    Modified Rankin (Stroke Patients Only)       Balance Overall balance assessment: Needs assistance, History of Falls Sitting-balance  support: No upper extremity supported, Feet supported Sitting balance-Leahy Scale: Fair Sitting balance - Comments: Pt able to  hold himself up sitting EOB without UE support when instructed (utilized arms initially).                                     Pertinent Vitals/Pain Pain Assessment Faces Pain Scale: Hurts little more Pain Location: grimacing with bed mobility Pain Descriptors / Indicators: Grimacing    Home Living Family/patient expects to be discharged to:: Private residence Living Arrangements: Children Available Help at Discharge: Family;Personal care attendant;Available 24 hours/day;Available PRN/intermittently (not full 24/7 but family has routine worked out.) Type of Home: House Home Access: Chicken: One level [place]: Conservation officer, nature (2 wheels);BSC/3in1;Shower seat - built in;Rollator (4 wheels) (lift chair) Additional Comments: lives with spouse who is disabled. Daughter and son in law are caregivers for both. HH aide 1x week assists with bathing, meal prep, and shaving. feeds and dresses himself with setup.  1-2x wk for [facility] OT/PT. SIL endorses pt is asleep most of the time.    Prior Function Prior Level of Function : Needs assist             Mobility Comments: Pt able to ambulate short distances independently to bathroom using rollator at baseline. Pt also able to perform squat pivot transfer from wheelchair <> lift chair. Pt was able to scoot himself in wheelchair prior but did not help getting into house (ramp access). ADLs Comments: assist with bathing, shaving, sometimes need assists for transfers (recently), assist with meal prep.     Hand Dominance        Extremity/Trunk Assessment   Upper Extremity Assessment Upper Extremity Assessment: Defer to OT evaluation    Lower Extremity Assessment Lower Extremity Assessment:  (3-/5 strength in bilateral quads and hip flexors; significant weakness evident overall)       Communication   Communication: HOH;Expressive difficulties (baseline expressive difficulties (slurred speech)  since last seizure)  Cognition Arousal/Alertness: Lethargic (more alert sitting EOB and once moving) Behavior During Therapy: Flat affect Overall Cognitive Status: Difficult to assess                                 General Comments: able to follow one step commands with increased time, some decreased initiation. speech is difficult to understand (baseline per SIL)        General Comments      Exercises     Assessment/Plan    PT Assessment Patient needs continued PT services  PT Problem List Decreased strength;Decreased activity tolerance;Decreased balance;Decreased mobility;Decreased safety awareness;Decreased knowledge of precautions;Decreased skin integrity;Decreased knowledge of use of DME;Decreased cognition       PT Treatment Interventions DME instruction;Gait training;Therapeutic activities;Therapeutic exercise;Patient/family education;Wheelchair mobility training;Balance training;Neuromuscular re-education;Functional mobility training    PT Goals (Current goals can be found in the Care Plan section)  Acute Rehab PT Goals Patient Stated Goal: Unable to verbalize any goals. PT Goal Formulation: With patient/family Time For Goal Achievement: 06/04/22 Potential to Achieve Goals: guarded    Frequency Min 3X/week     Co-evaluation   Reason for Co-Treatment: For patient/therapist safety   OT goals addressed during session: ADL's and self-care       AM-PAC PT "6 Clicks" Mobility  Outcome Measure Help needed turning  from your back to your side while in a flat bed without using bedrails?: A Little Help needed moving from lying on your back to sitting on the side of a flat bed without using bedrails?: A Lot Help needed moving to and from a bed to a chair (including a wheelchair)?: Total Help needed standing up from a chair using your arms (e.g., wheelchair or bedside chair)?: Total Help needed to walk in hospital room?: Total Help needed climbing 3-5  steps with a railing? : Total 6 Click Score: 9    End of Session   Activity Tolerance:  (Patient tolerated treatment fair.) Patient left: in bed;with bed alarm set;with family/visitor present Nurse Communication: Mobility status PT Visit Diagnosis: Muscle weakness (generalized) (M62.81);History of falling (Z91.81);Adult, failure to thrive (R62.7)    Time: 7628-3151 PT Time Calculation (min) (ACUTE ONLY): 40 min   Charges:   PT Evaluation $PT Eval Moderate Complexity: 1 Mod PT Treatments $Therapeutic Activity: 8-22 mins        Donna Bernard, PT   [facility] 05/21/2022, 4:16 PM

## 2022-05-21 NOTE — Progress Notes (Signed)
Patient ID: Robert Fisher, male   DOB: 1934/01/25, 88 y.o.   MRN: [id number] [facility] Surgery Progress Note     Subjective: CC-  Resting comfortably this morning. Denies abdominal pain, nausea, vomiting. Tolerated diet yesterday without recurrence of symptoms. BM yesterday.   Objective: Vital signs in last 24 hours: Temp:  [98.1 F (36.7 C)-98.7 F (37.1 C)] 98.3 F (36.8 C) (06/21 0757) Pulse Rate:  [61-93] 77 (06/21 0757) Resp:  [15-22] 17 (06/21 0757) BP: (121-148)/(58-82) 138/61 (06/21 0757) SpO2:  [91 %-97 %] 94 % (06/21 0757) Last BM Date : 05/14/22  Intake/Output from previous day: 06/20 0701 - 06/21 0700 In: 50 [IV Piggyback:50] Out: -  Intake/Output this shift: No intake/output data recorded.  PE: Gen:  Alert, NAD, does not open eyes but answers questions Abd: soft, ND, NT to light and deep palpation  Lab Results:  Recent Labs    05/19/22 1343 05/20/22 0404  WBC 9.9 9.0  HGB 14.8 14.2  HCT 45.8 44.8  PLT 259 247   BMET Recent Labs    05/19/22 1343 05/20/22 0404  NA 139 137  K 3.6 3.4*  CL 102 101  CO2 26 25  GLUCOSE 179* 197*  BUN 17 13  CREATININE 1.36* 1.11  CALCIUM 9.6 9.3   PT/INR Recent Labs    05/19/22 1343 05/20/22 0404  LABPROT 49.3* 54.8*  INR 5.5* 6.3*   CMP     Component Value Date/Time   NA 137 05/20/2022 0404   K 3.4 (L) 05/20/2022 0404   CL 101 05/20/2022 0404   CO2 25 05/20/2022 0404   GLUCOSE 197 (H) 05/20/2022 0404   BUN 13 05/20/2022 0404   CREATININE 1.11 05/20/2022 0404   CREATININE 1.26 (H) 07/11/2021 1412   CALCIUM 9.3 05/20/2022 0404   PROT 7.0 05/20/2022 0404   ALBUMIN 2.8 (L) 05/20/2022 0404   AST 155 (H) 05/20/2022 0404   ALT 163 (H) 05/20/2022 0404   ALKPHOS 61 05/20/2022 0404   BILITOT 0.9 05/20/2022 0404   GFRNONAA >60 05/20/2022 0404   GFRNONAA 56 (L) 07/11/2021 1412   GFRAA 58 (L) 06/22/2020 1355   Lipase     Component Value Date/Time   LIPASE 29 05/19/2022 1343        Studies/Results: CT CHEST ABDOMEN PELVIS W CONTRAST  Result Date: 05/19/2022 CLINICAL DATA:  RIGHT lower quadrant pain EXAM: CT CHEST, ABDOMEN, AND PELVIS WITH CONTRAST TECHNIQUE: Multidetector CT imaging of the chest, abdomen and pelvis was performed following the standard protocol during bolus administration of intravenous contrast. RADIATION DOSE REDUCTION: This exam was performed according to the departmental dose-optimization program which includes automated exposure control, adjustment of the mA and/or kV according to patient size and/or use of iterative reconstruction technique. CONTRAST:  145mL OMNIPAQUE IOHEXOL 300 MG/ML  SOLN COMPARISON:  Chest CT 11/14/2021 FINDINGS: CT CHEST FINDINGS Cardiovascular: No significant vascular findings. Normal heart size. No pericardial effusion. Mediastinum/Nodes: No axillary or supraclavicular adenopathy. No mediastinal or hilar adenopathy. No pericardial fluid. Esophagus normal. Lungs/Pleura: Angular band of dense atelectasis in the LEFT upper lobe is not changed. No suspicious pulmonary nodules. Musculoskeletal: No aggressive osseous lesion. CT ABDOMEN AND PELVIS FINDINGS Hepatobiliary: Liver is normal. The gallbladder is distended to 5.3 cm. Gallbladder wall is thickened and there is inflammatory stranding surrounding the gallbladder. There are several dense gallstones towards the neck of the gallbladder. Common bile duct normal caliber. No intrahepatic duct dilatation. Pancreas: No pancreatic inflammation.  Pancreatic duct normal. Spleen: Normal spleen Adrenals/urinary  tract: Adrenal glands are normal. Kidneys, ureters and bladder normal. Stomach/Bowel: Stomach, small bowel, appendix, and cecum are normal. The colon and rectosigmoid colon are normal. Vascular/Lymphatic: Abdominal aorta is normal caliber. There is no retroperitoneal or periportal lymphadenopathy. No pelvic lymphadenopathy. Reproductive: Prostate unremarkable Other: Fall amount of fluid  along the RIGHT pericolic gutter related to the presumed gallbladder inflammation. Musculoskeletal: No aggressive osseous lesion. IMPRESSION: Chest Impression: 1. No acute findings in chest. 2. Stable consolidation /  atelectasis in the LEFT upper lobe. Abdomen / Pelvis Impression: 1. CT findings consistent with acute cholecystitis. 2. No bowel obstruction or ureteral obstruction. Electronically Signed   By: Suzy Bouchard M.D.   On: 05/19/2022 18:43   DG Foot 2 Views Left  Result Date: 05/19/2022 CLINICAL DATA:  Bilateral foot pain for 1 day after falling out of bed. EXAM: LEFT FOOT - 2 VIEW COMPARISON:  None Available. FINDINGS: There is no evidence of fracture or dislocation. There is no evidence of arthropathy or other focal bone abnormality. There is dorsal soft tissue swelling overlying the metacarpal bones. IMPRESSION: 1. No acute bone abnormality. 2. Dorsal soft tissue swelling. Electronically Signed   By: Kerby Moors M.D.   On: 05/19/2022 17:49   DG Foot 2 Views Right  Result Date: 05/19/2022 CLINICAL DATA:  Fall. Pain. Bilateral leg and foot pain. History of prior right leg surgery. EXAM: RIGHT FOOT - 2 VIEW COMPARISON:  None available FINDINGS: Mildly decreased bone mineralization. Small plantar calcaneal heel spur. Mild hammertoe angulation of all 5 digits. Mild great toe metatarsophalangeal joint space narrowing. No acute fracture is seen. No dislocation. Mild vascular calcifications. IMPRESSION: Mild great toe metatarsophalangeal joint osteoarthritis. Small plantar calcaneal heel spur. Electronically Signed   By: Yvonne Kendall M.D.   On: 05/19/2022 17:48   DG Tibia/Fibula Left  Result Date: 05/19/2022 CLINICAL DATA:  fall, pain EXAM: LEFT TIBIA AND FIBULA - 2 VIEW COMPARISON:  None available FINDINGS: There is no evidence of fracture or other focal bone lesions. 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 Visualized ankle grossly unremarkable. Soft tissues are  unremarkable. Vascular calcifications. IMPRESSION: 1. No acute displaced fracture or dislocation. 2. 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 Electronically Signed   By: Iven Finn M.D.   On: 05/19/2022 17:46   DG Tibia/Fibula Right  Result Date: 05/19/2022 CLINICAL DATA:  fall, pain EXAM: RIGHT TIBIA AND FIBULA - 2 VIEW COMPARISON:  None Available. FINDINGS: There is no evidence of fracture or other focal bone lesions. Soft tissues are unremarkable. Tricompartmental severe degenerative changes of the visualized knee. Partially visualized ankle grossly unremarkable. Vascular clips overlie small leg. IMPRESSION: 1. No acute displaced fracture or dislocation. 2. Tricompartmental severe degenerative changes of the visualized knee. Electronically Signed   By: Iven Finn M.D.   On: 05/19/2022 17:45   CT HEAD WO CONTRAST (5MM)  Result Date: 05/19/2022 CLINICAL DATA:  Trauma EXAM: CT HEAD WITHOUT CONTRAST TECHNIQUE: Contiguous axial images were obtained from the base of the skull through the vertex without intravenous contrast. RADIATION DOSE REDUCTION: This exam was performed according to the departmental dose-optimization program which includes automated exposure control, adjustment of the mA and/or kV according to patient size and/or use of iterative reconstruction technique. COMPARISON:  04/03/2022 FINDINGS: Brain: No acute findings are seen in noncontrast CT brain. There are no signs of bleeding within the cranium. Cortical sulci are prominent. There is decreased density in the periventricular and subcortical white matter. Vascular: There are scattered arterial  calcifications. Skull: Unremarkable. Sinuses/Orbits: Unremarkable. Other: No significant interval changes are noted. IMPRESSION: No acute intracranial findings are seen in noncontrast CT brain. Atrophy. Small-vessel disease. Electronically Signed   By: Elmer Picker M.D.   On: 05/19/2022 14:51   DG Chest 2  View  Result Date: 05/19/2022 CLINICAL DATA:  Lower extremity edema.  History of lung cancer. EXAM: CHEST - 2 VIEW COMPARISON:  Chest radiograph 03/29/2022 FINDINGS: The cardiomediastinal silhouette is within normal limits. Left upper lobe fiducial markers and surrounding parenchymal opacity are unchanged and related to previously treated lung cancer. There is associated left upper lobe volume loss. The right lung is clear. No sizable pleural effusion or pneumothorax is identified. No acute osseous abnormality is seen. IMPRESSION: No evidence of active cardiopulmonary disease. Electronically Signed   By: Logan Bores M.D.   On: 05/19/2022 14:14    Anti-infectives: Anti-infectives (From admission, onward)    Start     Dose/Rate Route Frequency Ordered Stop   05/19/22 2200  piperacillin-tazobactam (ZOSYN) IVPB 3.375 g        3.375 g 12.5 mL/hr over 240 Minutes Intravenous Every 8 hours 05/19/22 2016     05/19/22 1930  piperacillin-tazobactam (ZOSYN) IVPB 3.375 g        3.375 g 100 mL/hr over 30 Minutes Intravenous  Once 05/19/22 1919 05/19/22 2018        Assessment/Plan Acute cholecystitis  - labs are pending this morning - Patient is nontender on abdominal exam today and he tolerated a diet yesterday without recurrence of symptoms. Continue IV antibiotics and monitor. If symptoms return would recommend percutaneous cholecystostomy tube placement once INR <2, but for now would just continue antibiotics.    ID - zosyn 6/19>> FEN - regular diet VTE - SCDs, hold coumadin Foley - none   HTN CHF Hx DVT/PE on coumadin DM CKD2 HLD Seizure disorder Paranoid schizophrenia   I reviewed hospitalist notes, last 24 h vitals and pain scores, last 48 h intake and output, last 24 h labs and trends, and last 24 h imaging results    LOS: 2 days    Wellington Hampshire, [facility] Surgery 05/21/2022, 8:22 AM Please see Amion for pager number during day hours 7:00am-4:30pm

## 2022-05-21 NOTE — Progress Notes (Addendum)
PROGRESS NOTE    Robert Fisher  XBD:[id number] DOB: 05-25-1934 DOA: 05/19/2022 PCP: Shon Baton, MD   Chief Complaint  Patient presents with   Fall   Leg Swelling    Brief Narrative:  Robert Fisher is a 88 y.o. male with PMH significant for DM2, HTN, HLD, DVT/PE on Coumadin, chronic bilateral lower extremity edema on furosemide 4 times a week and HCTZ daily, former tobacco user 100 pack years, quit 6 years ago, COPD, lung cancer, paranoid schizophrenia, seizure disorder followed by neurology. Patient was brought to the ED on 6/19 with complaint of progressively worsening bilateral lower extremity edema despite compliance with diuretics at home.  He also had progressive global weakness leading to a fall from his wheelchair on the morning of presentation.  Also endorsed intermittent right upper quadrant abdominal pain without nausea or vomiting.   In the ED, patient was afebrile, hemodynamically stable Labs with CBC unremarkable, BUN/creatinine 70/1.36, slightly elevated liver enzymes. CT abdomen showed gallstones, acute cholecystitis without bile duct dilatation. INR supratherapeutic at 5.5 on Coumadin Patient was started on IV Zosyn Admitted to hospitalist service General surgery consulted.   Assessment & Plan:   Principal Problem:   Acute cholecystitis Active Problems:   Paranoid schizophrenia in remission ([place])   PULMONARY EMBOLISM, HX OF   Essential hypertension   Seizure (HCC)   CKD (chronic kidney disease) stage 2, GFR 60-89 ml/min   DM (diabetes mellitus), type 2 (HCC)   Constipation   Bilateral leg edema   Elevated INR   LFT elevation  #1 acute cholecystitis/cholelithiasis -Patient presented right upper quadrant abdominal pain. -Patient noted to be afebrile, no leukocytosis but elevated LFTs without CBD dilatation. -CT abdomen and pelvis done with CT findings consistent with acute cholecystitis, no bowel obstruction or ureteral obstruction. -Patient with  some clinical improvement on IV antibiotics. -Patient seen in consultation by general surgery who recommended cholecystostomy tube placement however unable to be done at this time due to supratherapeutic INR. -General surgery following and recommending continuation of IV antibiotics and if patient's symptoms return we will recommend percutaneous cholecystostomy tube placement once INR < 2.  -General surgery following and appreciate input and recommendations.  2.  Transaminitis -Likely secondary to problem #1. -Acute hepatitis panel negative. -CT abdomen and pelvis with normal liver morphology. -Continue to hold statin.  3.  Chronic bilateral lower extremity edema/essential hypertension/chronic diastolic CHF -Patient with some reports of grossly worsening bilateral lower extremity edema despite compliance with diuretics. -Patient with no respiratory symptoms. -Patient noted to be on furosemide 20 mg 4 times a week as well as HCTZ 12.5 mg daily, and Lopressor 25 mg daily. -2D echo from 03/2022 with normal LV function, grade 2 diastolic dysfunction, normal pulmonary artery systolic pressure. -Diuretics initially on hold however has been resumed, BP normal. -Continue Lopressor.Continue -We will hold Lasix for another day as patient looks clinically dehydrated and gentle hydration. -Compression stockings. -Keep lower extremity elevated.  4.  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 -Patient noted to have been having frequent falls at home, Dr. Pietro Cassis recommended against resuming Coumadin and family noted to be agreeable. -INR supratherapeutic currently at 6.7. -Patient with no bleeding. -Monitor. -If INR continues to worsen in the next 24 hours or patient starts to bleed we will give vitamin K.  5.  Type 2 diabetes mellitus -Hemoglobin A1c 6.8 on 05/20/2022. -Patient noted to be on Tradjenta  5  mg daily, metformin 500 twice daily prior to admission. -CBG 177 -Continue Tradjenta. -Continue to hold metformin. -SSI.  6.  Paranoid schizophrenia -Noted to be on Klonopin, Zyprexa prior to admission. -Continue Zyprexa. -Klonopin.  7.  Dehydration -Hold Lasix for another 24 hours. -Gentle hydration for the next 1 to 2 days.  8.  CKD stage II -Stable.  9.  Hyperlipidemia -Holding Lipitor and fenofibrate due to transaminitis. -Continue to hold aspirin.  10.  Seizure disorder -Stable. -Continue Keppra. -Seizure precautions.  11.  Constipation -Continue current bowel regimen.  12.  Ambulatory dysfunction, uses wheelchair and cane intermittently -PT /OT following and recommending SNF. -Fall precautions  12.  Sacral decubitus pressure ulcer, POA -Continue current wound care. -Wound care consulted. Pressure Injury 04/03/22 Buttocks Right Stage 1 -  Intact skin with non-blanchable redness of a localized area usually over a bony prominence. partial thickness skin loss related to moisture associated skin damage, Not pressure (Active)  04/03/22 1236  Location: Buttocks  Location Orientation: Right  Staging: Stage 1 -  Intact skin with non-blanchable redness of a localized area usually over a bony prominence.  Wound Description (Comments): partial thickness skin loss related to moisture associated skin damage, Not pressure  Present on Admission: Yes (daughter states they have been putting ointment on it.)         DVT prophylaxis: Supratherapeutic INR. Code Status: Partial Family Communication: Updated patient.  No family at bedside. Disposition: TBD  Status is: Inpatient Remains inpatient appropriate because: Severity of illness   Consultants:  Wound care RN Julien Girt 05/20/2022 General surgery: Dr. Zenia Resides 05/19/2022  Procedures:  CT chest/abdomen and pelvis 05/19/2022 Plan films of the left foot 05/19/2022 Plain films of the right foot 05/19/2022 Plain films of  bilateral tib-fib 05/19/2022   Antimicrobials:  IV Zosyn 05/19/2022>>>>>   Subjective: Sitting up in bed with mittens on.  No chest pain.  No shortness of breath.  Denies any abdominal pain.  Tolerated oral intake however poor oral intake as patient eating approximately <25% of meal.  No overt bleeding  Objective: Vitals:   05/20/22 2135 05/21/22 0526 05/21/22 0757 05/21/22 1531  BP: 133/82 (!) 148/58 138/61 121/72  Pulse: 90 61 77 74  Resp: 18 20 17 17   Temp: 98.2 F (36.8 C) 98.1 F (36.7 C) 98.3 F (36.8 C) 98.6 F (37 C)  TempSrc:  Oral  Oral  SpO2: 91% 97% 94% 95%  Weight:      Height:        Intake/Output Summary (Last 24 hours) at 05/21/2022 1643 Last data filed at 05/21/2022 1632 Gross per 24 hour  Intake 382.78 ml  Output --  Net 382.78 ml   Filed Weights   05/19/22 1312  Weight: 83.9 kg    Examination:  General exam: Appears calm and comfortable.  Extremely dry mucous membranes. Respiratory system: CTA B anterior lung fields.  No wheezes, no crackles, no rhonchi.  Fair air movement.   Cardiovascular system: S1 & S2 heard, RRR. No JVD, murmurs, rubs, gallops or clicks. No pedal edema. Gastrointestinal system: Abdomen is nondistended, soft and nontender. No organomegaly or masses felt. Normal bowel sounds heard. Central nervous system: Alert and oriented. No focal neurological deficits. Extremities: Symmetric 5 x 5 power. Skin: No rashes, lesions or ulcers Psychiatry: Judgement and insight appear normal. Mood & affect appropriate.     Data Reviewed: I have personally reviewed following labs and imaging studies  CBC: Recent Labs  Lab 05/19/22 1343 05/20/22  0404 05/21/22 1055  WBC 9.9 9.0 9.1  NEUTROABS 7.4 7.0 6.9  HGB 14.8 14.2 15.2  HCT 45.8 44.8 46.5  MCV 94.4 92.6 91.5  PLT 259 247 884    Basic Metabolic Panel: Recent Labs  Lab 05/19/22 1343 05/20/22 0404 05/21/22 1055  NA 139 137 138  K 3.6 3.4* 3.4*  CL 102 101 100  CO2 26 25 27    GLUCOSE 179* 197* 150*  BUN 17 13 12   CREATININE 1.36* 1.11 1.14  CALCIUM 9.6 9.3 9.1  MG  --  2.0  --   PHOS  --  2.5  --     GFR: Estimated Creatinine Clearance: 50 mL/min (by C-G formula based on SCr of 1.14 mg/dL).  Liver Function Tests: Recent Labs  Lab 05/19/22 1343 05/20/22 0404  AST 166* 155*  ALT 151* 163*  ALKPHOS 57 61  BILITOT 0.7 0.9  PROT 6.6 7.0  ALBUMIN 2.9* 2.8*    CBG: Recent Labs  Lab 05/20/22 1057 05/20/22 1731 05/20/22 2137 05/21/22 0759 05/21/22 1123  GLUCAP 197* 189* 194* 177* 151*     No results found for this or any previous visit (from the past 240 hour(s)).       Radiology Studies: CT CHEST ABDOMEN PELVIS W CONTRAST  Result Date: 05/19/2022 CLINICAL DATA:  RIGHT lower quadrant pain EXAM: CT CHEST, ABDOMEN, AND PELVIS WITH CONTRAST TECHNIQUE: Multidetector CT imaging of the chest, abdomen and pelvis was performed following the standard protocol during bolus administration of intravenous contrast. RADIATION DOSE REDUCTION: This exam was performed according to the departmental dose-optimization program which includes automated exposure control, adjustment of the mA and/or kV according to patient size and/or use of iterative reconstruction technique. CONTRAST:  163mL OMNIPAQUE IOHEXOL 300 MG/ML  SOLN COMPARISON:  Chest CT 11/14/2021 FINDINGS: CT CHEST FINDINGS Cardiovascular: No significant vascular findings. Normal heart size. No pericardial effusion. Mediastinum/Nodes: No axillary or supraclavicular adenopathy. No mediastinal or hilar adenopathy. No pericardial fluid. Esophagus normal. Lungs/Pleura: Angular band of dense atelectasis in the LEFT upper lobe is not changed. No suspicious pulmonary nodules. Musculoskeletal: No aggressive osseous lesion. CT ABDOMEN AND PELVIS FINDINGS Hepatobiliary: Liver is normal. The gallbladder is distended to 5.3 cm. Gallbladder wall is thickened and there is inflammatory stranding surrounding the gallbladder.  There are several dense gallstones towards the neck of the gallbladder. Common bile duct normal caliber. No intrahepatic duct dilatation. Pancreas: No pancreatic inflammation.  Pancreatic duct normal. Spleen: Normal spleen Adrenals/urinary tract: Adrenal glands are normal. Kidneys, ureters and bladder normal. Stomach/Bowel: Stomach, small bowel, appendix, and cecum are normal. The colon and rectosigmoid colon are normal. Vascular/Lymphatic: Abdominal aorta is normal caliber. There is no retroperitoneal or periportal lymphadenopathy. No pelvic lymphadenopathy. Reproductive: Prostate unremarkable Other: Fall amount of fluid along the RIGHT pericolic gutter related to the presumed gallbladder inflammation. Musculoskeletal: No aggressive osseous lesion. IMPRESSION: Chest Impression: 1. No acute findings in chest. 2. Stable consolidation /  atelectasis in the LEFT upper lobe. Abdomen / Pelvis Impression: 1. CT findings consistent with acute cholecystitis. 2. No bowel obstruction or ureteral obstruction. Electronically Signed   By: Suzy Bouchard M.D.   On: 05/19/2022 18:43   DG Foot 2 Views Left  Result Date: 05/19/2022 CLINICAL DATA:  Bilateral foot pain for 1 day after falling out of bed. EXAM: LEFT FOOT - 2 VIEW COMPARISON:  None Available. FINDINGS: There is no evidence of fracture or dislocation. There is no evidence of arthropathy or other focal bone abnormality. There is dorsal soft  tissue swelling overlying the metacarpal bones. IMPRESSION: 1. No acute bone abnormality. 2. Dorsal soft tissue swelling. Electronically Signed   By: Kerby Moors M.D.   On: 05/19/2022 17:49   DG Foot 2 Views Right  Result Date: 05/19/2022 CLINICAL DATA:  Fall. Pain. Bilateral leg and foot pain. History of prior right leg surgery. EXAM: RIGHT FOOT - 2 VIEW COMPARISON:  None available FINDINGS: Mildly decreased bone mineralization. Small plantar calcaneal heel spur. Mild hammertoe angulation of all 5 digits. Mild great toe  metatarsophalangeal joint space narrowing. No acute fracture is seen. No dislocation. Mild vascular calcifications. IMPRESSION: Mild great toe metatarsophalangeal joint osteoarthritis. Small plantar calcaneal heel spur. Electronically Signed   By: Yvonne Kendall M.D.   On: 05/19/2022 17:48   DG Tibia/Fibula Left  Result Date: 05/19/2022 CLINICAL DATA:  fall, pain EXAM: LEFT TIBIA AND FIBULA - 2 VIEW COMPARISON:  None available FINDINGS: There is no evidence of fracture or other focal bone lesions. 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 Visualized ankle grossly unremarkable. Soft tissues are unremarkable. Vascular calcifications. IMPRESSION: 1. No acute displaced fracture or dislocation. 2. 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 Electronically Signed   By: Iven Finn M.D.   On: 05/19/2022 17:46   DG Tibia/Fibula Right  Result Date: 05/19/2022 CLINICAL DATA:  fall, pain EXAM: RIGHT TIBIA AND FIBULA - 2 VIEW COMPARISON:  None Available. FINDINGS: There is no evidence of fracture or other focal bone lesions. Soft tissues are unremarkable. Tricompartmental severe degenerative changes of the visualized knee. Partially visualized ankle grossly unremarkable. Vascular clips overlie small leg. IMPRESSION: 1. No acute displaced fracture or dislocation. 2. Tricompartmental severe degenerative changes of the visualized knee. Electronically Signed   By: Iven Finn M.D.   On: 05/19/2022 17:45        Scheduled Meds:  clonazePAM  0.25 mg Oral BID   [START ON 05/23/2022] furosemide  20 mg Oral Daily   insulin aspart  0-5 Units Subcutaneous QHS   insulin aspart  0-9 Units Subcutaneous TID WC   levETIRAcetam  250 mg Oral BID   linagliptin  5 mg Oral QODAY   metoprolol tartrate  12.5 mg Oral BID   OLANZapine  20 mg Oral QHS   senna-docusate  1 tablet Oral QHS   Continuous Infusions:  sodium chloride 100 mL/hr at 05/21/22 1240    piperacillin-tazobactam 3.375 g (05/21/22 1353)     LOS: 2 days    Time spent: 40 minutes    Irine Seal, MD [facility]   To contact the attending provider between 7A-7P or the covering provider during after hours 7P-7A, please log into the web site [email or web address] and access using universal [place] password for that web site. If you do not have the password, please call the hospital operator.  05/21/2022, 4:43 PM

## 2022-05-21 NOTE — Evaluation (Signed)
Occupational Therapy Evaluation Patient Details Name: Robert Fisher MRN: [id number] DOB: [birth year] Today's Date: 05/21/2022   History of Present Illness Pt is 88 yo male who presented to [facility] ED from home due to progressively worsening bilateral lower extremity edema despite taking home diuretics, also reports fall from w/c. PMH includes: HTN, type 2 diabetes, hyperlipidemia, DVT/PE on Coumadin, chronic bilateral lower extremity edema on furosemide 4 times a week and HCTZ daily, COPD, lung cancer, paranoid schizophrenia, seizure disorder followed by neurology.   Clinical Impression   Pt admitted with the above diagnoses and presents with below problem list. Pt will benefit from continued acute OT to address the below listed deficits and maximize independence with basic ADLs prior to d/c to venue below. Son-in-law present on eval and provided PLOF and home setup as pt's speech is difficult to understand (baseline since last seizure). Pt and wife live with daughter and son-in-law and both require assists ADLs. Pt at baseline can pivot into manual w/c and use his feet to propel though family reports that pt spends the vast majority of his time sleeping. Today, pt needs +2 mod A with bed mobility, up to max A +2 with LB ADLs, up to mod A with UB ADLs in seated position. Family to consider recommendation for ST SNF for continued rehab at d/c.        Recommendations for follow up therapy are one component of a multi-disciplinary discharge planning process, led by the attending physician.  Recommendations may be updated based on patient status, additional functional criteria and insurance authorization.   Follow Up Recommendations  Skilled nursing-short term rehab (<3 hours/day)    Assistance Recommended at Discharge Frequent or constant Supervision/Assistance  Patient can return home with the following Two people to help with walking and/or transfers;Two people to help with  bathing/dressing/bathroom;Assistance with feeding;Assistance with cooking/housework;Direct supervision/assist for medications management;Direct supervision/assist for financial management;Assist for transportation    Functional Status Assessment  Patient has had a recent decline in their functional status and demonstrates the ability to make significant improvements in function in a reasonable and predictable amount of time.  Equipment Recommendations  Other (comment) (defer to next venue)    Recommendations for Other Services       Precautions / Restrictions Precautions Precautions: Fall Precaution Comments: h/o seizure 8-9 months ago per SIL Restrictions Weight Bearing Restrictions: No      Mobility Bed Mobility Overal bed mobility: Needs Assistance Bed Mobility: Supine to Sit, Sit to Supine     Supine to sit: Mod assist, +2 for physical assistance, HOB elevated Sit to supine: Mod assist, +2 for physical assistance   General bed mobility comments: assist to advance BLE and pivot hips to full EOB position, truncal support.    Transfers                   General transfer comment: deferred      Balance Overall balance assessment: Needs assistance, History of Falls Sitting-balance support: No upper extremity supported, Feet supported Sitting balance-Leahy Scale: Fair                                     ADL either performed or assessed with clinical judgement   ADL Overall ADL's : Needs assistance/impaired Eating/Feeding: Moderate assistance;Bed level   Grooming: Bed level;Moderate assistance   Upper Body Bathing: Maximal assistance;Sitting   Lower Body Bathing: Moderate assistance;+2 for physical  assistance;Maximal assistance;Sitting/lateral leans   Upper Body Dressing : Moderate assistance;Sitting   Lower Body Dressing: Maximal assistance;Moderate assistance;+2 for physical assistance;Sitting/lateral leans                 General  ADL Comments: Pt completed bed mobility, sat EOB about 5 minutes. lethargic (baseline per SIL), more alert while sitting EOB.     Vision         Perception     Praxis      Pertinent Vitals/Pain Pain Assessment Pain Assessment: Faces Faces Pain Scale: Hurts little more Pain Location: grimacing with bed mobility Pain Descriptors / Indicators: Grimacing Pain Intervention(s): Monitored during session, Limited activity within patient's tolerance, Repositioned     Hand Dominance     Extremity/Trunk Assessment Upper Extremity Assessment Upper Extremity Assessment: Generalized weakness   Lower Extremity Assessment Lower Extremity Assessment: Defer to PT evaluation       Communication Communication Communication: HOH;Expressive difficulties (baseline expressive difficulties (slurred speech) since last seizure)   Cognition Arousal/Alertness: Lethargic (more alert sitting EOB) Behavior During Therapy: Flat affect Overall Cognitive Status: Difficult to assess                                 General Comments: able to follow one step commands with increased time, some decreased initiation.  speech is difficult to understand (baseline per SIL)     General Comments       Exercises     Shoulder Instructions      Home Living Family/patient expects to be discharged to:: Private residence Living Arrangements: Children Available Help at Discharge: Family;Personal care attendant;Available 24 hours/day;Available PRN/intermittently (not full 24/7 but family has routine worked out.) Type of Home: House Home Access: [place]: One level     Bathroom Shower/Tub: [place]: Conservation officer, nature (2 wheels);Standard Walker;BSC/3in1;Shower seat - built in   Additional Comments: lives with spouse who is disabled. Daughter and son in law are caregivers for both. HH aide 1x week assists with bathing, meal prep, and shaving.  feeds and dresses himself with setup.  1-2x wk for [facility] OT/PT. SIL endorses pt is asleep most of the time.      Prior Functioning/Environment Prior Level of Function : Needs assist             Mobility Comments: squat-pivtol w/c.sleeps in lift chair ADLs Comments: assist with bathing, shaving, sometimesneed assists for transfers( recently), assist with meal prep.        OT Problem List: Decreased strength;Decreased activity tolerance;Impaired balance (sitting and/or standing);Decreased cognition;Decreased knowledge of use of DME or AE;Decreased knowledge of precautions;Pain      OT Treatment/Interventions: Self-care/ADL training;Therapeutic exercise;Energy conservation;DME and/or AE instruction;Therapeutic activities;Patient/family education;Balance training    OT Goals(Current goals can be found in the care plan section) Acute Rehab OT Goals Patient Stated Goal: not stated OT Goal Formulation: With patient/family Time For Goal Achievement: 06/04/22 Potential to Achieve Goals: Good ADL Goals Pt Will Perform Grooming: with set-up;sitting;with supervision Pt Will Perform Upper Body Dressing: sitting;with min guard assist;with set-up Pt Will Perform Lower Body Dressing: with set-up;with min assist;sitting/lateral leans Pt Will Transfer to Toilet: with mod assist;stand pivot transfer;bedside commode Pt Will Perform Toileting - Clothing Manipulation and hygiene: with mod assist;sitting/lateral leans Additional ADL Goal #1: Pt will complete bed mobility at mod A +1 level to prepare  for EOB/OOB ADLs.  OT Frequency: Min 2X/week    Co-evaluation PT/OT/SLP Co-Evaluation/Treatment: Yes Reason for Co-Treatment: For patient/therapist safety   OT goals addressed during session: ADL's and self-care      AM-PAC OT "6 Clicks" Daily Activity     Outcome Measure Help from another person eating meals?: A Little Help from another person taking care of personal grooming?: A Little Help from  another person toileting, which includes using toliet, bedpan, or urinal?: Total Help from another person bathing (including washing, rinsing, drying)?: Total Help from another person to put on and taking off regular upper body clothing?: A Little Help from another person to put on and taking off regular lower body clothing?: Total 6 Click Score: 12   End of Session Nurse Communication: Mobility status;Precautions  Activity Tolerance: Patient tolerated treatment well;Patient limited by lethargy Patient left: in bed;with call bell/phone within reach;with family/visitor present  OT Visit Diagnosis: Unsteadiness on feet (R26.81);Muscle weakness (generalized) (M62.81);Pain;Cognitive communication deficit (R41.841);Other symptoms and signs involving cognitive function;Other symptoms and signs involving the nervous system (R29.898);History of falling (Z91.81)                Time: 2585-2778 OT Time Calculation (min): 33 min Charges:  OT General Charges $OT Visit: 1 Visit OT Evaluation $OT Eval Moderate Complexity: [place], OT Acute Rehabilitation Services Office: [phone]   Hortencia Pilar 05/21/2022, 2:27 PM

## 2022-05-21 NOTE — Progress Notes (Signed)
Critical INR at 6.7, notified MD. Will continue to monitor.

## 2022-05-22 ENCOUNTER — Inpatient Hospital Stay (HOSPITAL_COMMUNITY): Payer: Medicare Other

## 2022-05-22 DIAGNOSIS — R600 Localized edema: Secondary | ICD-10-CM | POA: Diagnosis not present

## 2022-05-22 DIAGNOSIS — N182 Chronic kidney disease, stage 2 (mild): Secondary | ICD-10-CM | POA: Diagnosis not present

## 2022-05-22 DIAGNOSIS — K810 Acute cholecystitis: Secondary | ICD-10-CM | POA: Diagnosis not present

## 2022-05-22 DIAGNOSIS — R7989 Other specified abnormal findings of blood chemistry: Secondary | ICD-10-CM | POA: Diagnosis not present

## 2022-05-22 LAB — GLUCOSE, CAPILLARY
Glucose-Capillary: 147 mg/dL — ABNORMAL HIGH (ref 70–99)
Glucose-Capillary: 99 mg/dL (ref 70–99)
Glucose-Capillary: 264 mg/dL — ABNORMAL HIGH (ref 70–99)
Glucose-Capillary: 214 mg/dL — ABNORMAL HIGH (ref 70–99)

## 2022-05-22 LAB — PROTIME-INR
INR: 6.9 (ref 0.8–1.2)
INR: 2.1 — ABNORMAL HIGH (ref 0.8–1.2)
Prothrombin Time: 59.4 seconds — ABNORMAL HIGH (ref 11.4–15.2)
Prothrombin Time: 23.2 seconds — ABNORMAL HIGH (ref 11.4–15.2)

## 2022-05-22 LAB — COMPREHENSIVE METABOLIC PANEL
ALT: 264 U/L — ABNORMAL HIGH (ref 0–44)
AST: 290 U/L — ABNORMAL HIGH (ref 15–41)
Albumin: 2.3 g/dL — ABNORMAL LOW (ref 3.5–5.0)
Alkaline Phosphatase: 78 U/L (ref 38–126)
Anion gap: 8 (ref 5–15)
BUN: 12 mg/dL (ref 8–23)
CO2: 24 mmol/L (ref 22–32)
Calcium: 8.4 mg/dL — ABNORMAL LOW (ref 8.9–10.3)
Chloride: 102 mmol/L (ref 98–111)
Creatinine, Ser: 1.24 mg/dL (ref 0.61–1.24)
GFR, Estimated: 57 mL/min — ABNORMAL LOW (ref >60)
Glucose, Bld: 160 mg/dL — ABNORMAL HIGH (ref 70–99)
Potassium: 3.7 mmol/L (ref 3.5–5.1)
Sodium: 134 mmol/L — ABNORMAL LOW (ref 135–145)
Total Bilirubin: 0.5 mg/dL (ref 0.3–1.2)
Total Protein: 6 g/dL — ABNORMAL LOW (ref 6.5–8.1)

## 2022-05-22 LAB — CBC WITH DIFFERENTIAL/PLATELET
Abs Immature Granulocytes: 0.06 10*3/uL (ref 0.00–0.07)
Basophils Absolute: 0 10*3/uL (ref 0.0–0.1)
Basophils Relative: 0 %
Eosinophils Absolute: 0.3 10*3/uL (ref 0.0–0.5)
Eosinophils Relative: 4 %
HCT: 43.8 % (ref 39.0–52.0)
Hemoglobin: 13.9 g/dL (ref 13.0–17.0)
Immature Granulocytes: 1 %
Lymphocytes Relative: 15 %
Lymphs Abs: 1.2 10*3/uL (ref 0.7–4.0)
MCH: 29.3 pg (ref 26.0–34.0)
MCHC: 31.7 g/dL (ref 30.0–36.0)
MCV: 92.4 fL (ref 80.0–100.0)
Monocytes Absolute: 1 10*3/uL (ref 0.1–1.0)
Monocytes Relative: 12 %
Neutro Abs: 5.8 10*3/uL (ref 1.7–7.7)
Neutrophils Relative %: 68 %
Platelets: 268 10*3/uL (ref 150–400)
RBC: 4.74 MIL/uL (ref 4.22–5.81)
RDW: 15.7 % — ABNORMAL HIGH (ref 11.5–15.5)
WBC: 8.4 10*3/uL (ref 4.0–10.5)
nRBC: 0 % (ref 0.0–0.2)

## 2022-05-22 MED ORDER — VITAMIN K1 10 MG/ML IJ SOLN
5.0000 mg | Freq: Once | INTRAMUSCULAR | Status: AC
  Administered 2022-05-22: 5 mg via INTRAVENOUS
  Filled 2022-05-22: qty 0.5

## 2022-05-22 MED ORDER — TECHNETIUM TC 99M MEBROFENIN IV KIT
5.1000 | PACK | Freq: Once | INTRAVENOUS | Status: AC | PRN
  Administered 2022-05-22: 5.1 via INTRAVENOUS

## 2022-05-22 MED ORDER — MORPHINE SULFATE (PF) 4 MG/ML IV SOLN
3.0000 mg | Freq: Once | INTRAVENOUS | Status: AC
  Administered 2022-05-22: 3 mg via INTRAVENOUS

## 2022-05-22 MED ORDER — MORPHINE SULFATE (PF) 4 MG/ML IV SOLN
INTRAVENOUS | Status: AC
  Filled 2022-05-22: qty 1

## 2022-05-22 NOTE — Progress Notes (Signed)
Patient ID: Robert Fisher, male   DOB: 03/13/1934, 88 y.o.   MRN: [id number] [facility] Surgery Progress Note     Subjective: CC-  Continues to feel well, denies abdominal pain or nausea.  Objective: Vital signs in last 24 hours: Temp:  [97.6 F (36.4 C)-98.6 F (37 C)] 97.9 F (36.6 C) (06/22 0756) Pulse Rate:  [60-74] 65 (06/22 0756) Resp:  [16-18] 16 (06/22 0756) BP: (121-135)/(56-72) 128/59 (06/22 0756) SpO2:  [93 %-99 %] 99 % (06/22 0756) Last BM Date : 05/19/22  Intake/Output from previous day: 06/21 0701 - 06/22 0700 In: 382.8 [P.O.:60; I.V.:122.8; IV Piggyback:200] Out: -  Intake/Output this shift: No intake/output data recorded.  PE: Gen:  Alert, NAD, does not open eyes but answers questions Abd: soft, ND, NT to light and deep palpation  Lab Results:  Recent Labs    05/21/22 1055 05/22/22 0131  WBC 9.1 8.4  HGB 15.2 13.9  HCT 46.5 43.8  PLT 252 268    BMET Recent Labs    05/21/22 1055 05/22/22 0131  NA 138 134*  K 3.4* 3.7  CL 100 102  CO2 27 24  GLUCOSE 150* 160*  BUN 12 12  CREATININE 1.14 1.24  CALCIUM 9.1 8.4*    PT/INR Recent Labs    05/21/22 1055 05/22/22 0131  LABPROT 57.5* 59.4*  INR 6.7* 6.9*    CMP     Component Value Date/Time   NA 134 (L) 05/22/2022 0131   K 3.7 05/22/2022 0131   CL 102 05/22/2022 0131   CO2 24 05/22/2022 0131   GLUCOSE 160 (H) 05/22/2022 0131   BUN 12 05/22/2022 0131   CREATININE 1.24 05/22/2022 0131   CREATININE 1.26 (H) 07/11/2021 1412   CALCIUM 8.4 (L) 05/22/2022 0131   PROT 6.0 (L) 05/22/2022 0131   ALBUMIN 2.3 (L) 05/22/2022 0131   AST 290 (H) 05/22/2022 0131   ALT 264 (H) 05/22/2022 0131   ALKPHOS 78 05/22/2022 0131   BILITOT 0.5 05/22/2022 0131   GFRNONAA 57 (L) 05/22/2022 0131   GFRNONAA 56 (L) 07/11/2021 1412   GFRAA 58 (L) 06/22/2020 1355   Lipase     Component Value Date/Time   LIPASE 29 05/19/2022 1343       Studies/Results: No results  found.  Anti-infectives: Anti-infectives (From admission, onward)    Start     Dose/Rate Route Frequency Ordered Stop   05/19/22 2200  piperacillin-tazobactam (ZOSYN) IVPB 3.375 g        3.375 g 12.5 mL/hr over 240 Minutes Intravenous Every 8 hours 05/19/22 2016     05/19/22 1930  piperacillin-tazobactam (ZOSYN) IVPB 3.375 g        3.375 g 100 mL/hr over 30 Minutes Intravenous  Once 05/19/22 1919 05/19/22 2018        Assessment/Plan ??Acute cholecystitis  - Patient remains completely asymptomatic but liver enzymes (AST and ALT are increasing, and INR persistently elevated now to 6.2.  Continue IV antibiotics and monitor. Will proceed with HIDA, if positive would recommend percutaneous cholecystostomy tube placement once INR <2, but for now would just continue antibiotics.    ID - zosyn 6/19>> FEN - regular diet VTE - SCDs, hold coumadin Foley - none   HTN CHF Hx DVT/PE on coumadin DM CKD2 HLD Seizure disorder Paranoid schizophrenia   I reviewed hospitalist notes, last 24 h vitals and pain scores, last 48 h intake and output, last 24 h labs and trends, and last 24 h imaging results  LOS: 3 days    Berna Bue, MD [facility] Surgery 05/22/2022, 10:13 AM Please see Amion for pager number during day hours 7:00am-4:30pm

## 2022-05-22 NOTE — TOC Initial Note (Signed)
Transition of Care [facility]) - Initial/Assessment Note    Patient Details  Name: Robert Fisher MRN: [id number] Date of Birth: 12-Oct-1934  [facility] CM/SW Contact:    Tresa Endo Phone Number: 05/22/2022, 3:50 PM  Clinical Narrative:                 CSW received SNF consult. CSW met with pt daughter via phone, pt is only oriented to self. CSW introduced self and explained role at the hospital. Pt reports that PTA pt lived at home with daughter . PT reports pt has functional limitations and requires modA X2 for bed mobility.   CSW reviewed PT/OT recommendations for SNF. Pt wants to return home and is not sure about going to a SNF. Pt daughter stated if pt's wound is not worse she will take him home bc he does not really want to go to a SNF. Pt daughter gave CSW permission to fax out to facilities in the area as a back up option for DC pending wound care. Pt has no preference of facility at this time.   CSW will continue to follow.    Expected Discharge Plan: Skilled Nursing Facility Barriers to Discharge: Continued Medical Work up   Patient Goals and CMS Choice Patient states their goals for this hospitalization and ongoing recovery are:: Rehab CMS Medicare.gov Compare Post Acute Care list provided to:: Patient Choice offered to / list presented to : Patient  Expected Discharge Plan and Services Expected Discharge Plan: [place] In-house Referral: Clinical Social Work   Post Acute Care Choice: [place] Living arrangements for the past 2 months: [place]                                      Prior Living Arrangements/Services Living arrangements for the past 2 months: [place] Lives with:: Adult Children Patient language and need for interpreter reviewed:: Yes Do you feel safe going back to the place where you live?: Yes      Need for Family Participation in Patient Care: Yes  (Comment) Care giver support system in place?: Yes (comment)   Criminal Activity/Legal Involvement Pertinent to Current Situation/Hospitalization: No - Comment as needed  Activities of Daily Living      Permission Sought/Granted Permission sought to share information with : Family Supports, Chartered certified accountant granted to share information with : Yes, Verbal Permission Granted  Share Information with NAME: Robert Fisher (Daughter)   [phone]  Permission granted to share info w AGENCY: SNF's  Permission granted to share info w Relationship: Robert Fisher (Daughter)   [phone]  Permission granted to share info w Contact Information: Robert Fisher (Daughter)   [phone]  Emotional Assessment Appearance:: Appears stated age Attitude/Demeanor/Rapport: Unable to Assess Affect (typically observed): Unable to Assess Orientation: : Oriented to Self Alcohol / Substance Use: Not Applicable Psych Involvement: No (comment)  Admission diagnosis:  Acute cholecystitis [K81.0] Elevated INR [R79.1] Bilateral leg edema [R60.0] LFT elevation [R79.89] Fall, initial encounter [W19.XXXA] Patient Active Problem List   Diagnosis Date Noted   Seizure ([place]) 05/21/2022   CKD (chronic kidney disease) stage 2, GFR 60-89 ml/min 05/21/2022   DM (diabetes mellitus), type 2 ([place]) 05/21/2022   Constipation 05/21/2022   Bilateral leg edema    Elevated INR    LFT elevation    Acute cholecystitis 05/19/2022   Essential hypertension 04/02/2022  Lower extremity edema 04/02/2022   Hallux malleus 04/01/2021   Malignant neoplasm of upper lobe of left lung ([place]) 06/25/2020   Dermatochalasis of both lower eyelids 10/06/2017   Dermatochalasis of both upper eyelids 10/06/2017   Melanoma of back ([place]) 10/18/2013   ADVEF, DRUG/MEDICINAL/BIOLOGICAL SUBST NOS 08/30/2007   HYPERLIPIDEMIA NEC/NOS 06/08/2007   Paranoid schizophrenia in remission ([place]) 03/19/2007   PERIPHERAL VASCULAR  DISEASE 03/19/2007   SKIN CANCER, HX OF 03/19/2007   PULMONARY EMBOLISM, HX OF 03/19/2007   PCP:  Shon Baton, MD Pharmacy:   [facility] [place], [place] [address] Phone: [phone] Fax: [phone]  [facility] [phone] [place], [place] [address] Phone: [phone] Fax: [phone]     Social Determinants of Health (SDOH) Interventions    Readmission Risk Interventions     No data to display

## 2022-05-22 NOTE — Progress Notes (Addendum)
Critical lab value INR 6.9, the hospitalist on call was notified.

## 2022-05-22 NOTE — Plan of Care (Signed)
  Problem: Coping: Goal: Ability to adjust to condition or change in health will improve Outcome: Progressing   Problem: Fluid Volume: Goal: Ability to maintain a balanced intake and output will improve Outcome: Progressing   Problem: Metabolic: Goal: Ability to maintain appropriate glucose levels will improve Outcome: Progressing   Problem: Nutritional: Goal: Maintenance of adequate nutrition will improve Outcome: Progressing Goal: Progress toward achieving an optimal weight will improve Outcome: Progressing   Problem: Tissue Perfusion: Goal: Adequacy of tissue perfusion will improve Outcome: Progressing   Problem: Activity: Goal: Risk for activity intolerance will decrease Outcome: Progressing   Problem: Nutrition: Goal: Adequate nutrition will be maintained Outcome: Progressing   Problem: Coping: Goal: Level of anxiety will decrease Outcome: Progressing   Problem: Pain Managment: Goal: General experience of comfort will improve Outcome: Progressing   Problem: Safety: Goal: Ability to remain free from injury will improve Outcome: Progressing   Problem: Skin Integrity: Goal: Risk for impaired skin integrity will decrease Outcome: Progressing

## 2022-05-22 NOTE — Progress Notes (Signed)
PROGRESS NOTE    Robert Fisher  ZOX:[id number] DOB: Jan 30, 1934 DOA: 05/19/2022 PCP: Shon Baton, MD   Chief Complaint  Patient presents with   Fall   Leg Swelling    Brief Narrative:  Robert Fisher is a 88 y.o. male with PMH significant for DM2, HTN, HLD, DVT/PE on Coumadin, chronic bilateral lower extremity edema on furosemide 4 times a week and HCTZ daily, former tobacco user 100 pack years, quit 6 years ago, COPD, lung cancer, paranoid schizophrenia, seizure disorder followed by neurology. Patient was brought to the ED on 6/19 with complaint of progressively worsening bilateral lower extremity edema despite compliance with diuretics at home.  He also had progressive global weakness leading to a fall from his wheelchair on the morning of presentation.  Also endorsed intermittent right upper quadrant abdominal pain without nausea or vomiting.   In the ED, patient was afebrile, hemodynamically stable Labs with CBC unremarkable, BUN/creatinine 70/1.36, slightly elevated liver enzymes. CT abdomen showed gallstones, acute cholecystitis without bile duct dilatation. INR supratherapeutic at 5.5 on Coumadin Patient was started on IV Zosyn Admitted to hospitalist service General surgery consulted.   Assessment & Plan:   Principal Problem:   Acute cholecystitis Active Problems:   Paranoid schizophrenia in remission ([place])   PULMONARY EMBOLISM, HX OF   Essential hypertension   Seizure (HCC)   CKD (chronic kidney disease) stage 2, GFR 60-89 ml/min   DM (diabetes mellitus), type 2 (HCC)   Constipation   Bilateral leg edema   Elevated INR   LFT elevation  #1 acute cholecystitis/cholelithiasis -Patient presented right upper quadrant abdominal pain. -Patient noted to be afebrile, no leukocytosis but elevated LFTs without CBD dilatation. -CT abdomen and pelvis done with CT findings consistent with acute cholecystitis, no bowel obstruction or ureteral obstruction. -Patient with  some clinical improvement on IV antibiotics. -Patient seen in consultation by general surgery who recommended cholecystostomy tube placement however unable to be done at this time due to supratherapeutic INR. -General surgery following and recommending continuation of IV antibiotics and if patient's symptoms return we will recommend percutaneous cholecystostomy tube placement once INR < 2.  -Patient with a worsening transaminitis, HIDA scan ordered.  General surgery to assess. -With increasing transaminitis general surgery recommending percutaneous cholecystostomy.. -We will give vitamin K 5 mg IV x1 as INR currently at 6.9 with repeat INR 2.1. -General surgery following and appreciate input and recommendations.  2.  Transaminitis -Likely secondary to problem #1. -Acute hepatitis panel negative. -Transaminitis trending up. -CT abdomen and pelvis with normal liver morphology. -HIDA scan ordered this morning and pending. -Continue to hold statin.  3.  Chronic bilateral lower extremity edema/essential hypertension/chronic diastolic CHF -Patient with some reports of grossly worsening bilateral lower extremity edema despite compliance with diuretics. -Patient with no respiratory symptoms. -Patient noted to be on furosemide 20 mg 4 times a week as well as HCTZ 12.5 mg daily, and Lopressor 25 mg daily. -2D echo from 03/2022 with normal LV function, grade 2 diastolic dysfunction, normal pulmonary artery systolic pressure. -Diuretics initially on hold however has been resumed, BP normal. -Continue Lopressor.Continue -Continue to hold diuretics another 24 hours.   -Compression stockings.   -Keep lower extremities elevated.  4.  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 -Patient noted to have been having frequent falls at home, Dr. Pietro Cassis recommended against resuming Coumadin and family noted to be  agreeable. -INR supratherapeutic  currently at 6.9. -Patient with no bleeding. -Vitamin K 5 mg IV x1. -Repeat INR this afternoon down to 2.1. -Check coags in the AM.  5.  Type 2 diabetes mellitus -Hemoglobin A1c 6.8 on 05/20/2022. -Patient noted to be on Tradjenta 5 mg daily, metformin 500 twice daily prior to admission. -CBG 147 this morning. -Continue Tradjenta. -Continue to hold metformin. -SSI.  6.  Paranoid schizophrenia -Noted to be on Klonopin, Zyprexa prior to admission. -Continue Zyprexa. -Klonopin.  7.  Dehydration -Continue to hold Lasix.   -Gentle hydration.   8.  CKD stage II -Stable.  9.  Hyperlipidemia -Holding Lipitor and fenofibrate due to transaminitis. -Continue to hold aspirin.  10.  Seizure disorder -Stable. -Continue Keppra.   -Seizure precautions.   11.  Constipation -Continue current bowel regimen.  12.  Ambulatory dysfunction, uses wheelchair and cane intermittently -PT /OT following and recommending SNF. -Fall precautions  12.  Sacral decubitus pressure ulcer, POA -Continue current wound care. -Wound care consulted. Pressure Injury 04/03/22 Buttocks Right Stage 1 -  Intact skin with non-blanchable redness of a localized area usually over a bony prominence. partial thickness skin loss related to moisture associated skin damage, Not pressure (Active)  04/03/22 1236  Location: Buttocks  Location Orientation: Right  Staging: Stage 1 -  Intact skin with non-blanchable redness of a localized area usually over a bony prominence.  Wound Description (Comments): partial thickness skin loss related to moisture associated skin damage, Not pressure  Present on Admission: Yes (daughter states they have been putting ointment on it.)         DVT prophylaxis: Supratherapeutic INR. Code Status: Partial Family Communication: Updated patient.  No family at bedside. Disposition: TBD  Status is: Inpatient Remains inpatient appropriate because:  Severity of illness   Consultants:  Wound care RN Julien Girt 05/20/2022 General surgery: Dr. Zenia Resides 05/19/2022  Procedures:  CT chest/abdomen and pelvis 05/19/2022 Plan films of the left foot 05/19/2022 Plain films of the right foot 05/19/2022 Plain films of bilateral tib-fib 05/19/2022 HIDA scan 05/22/2022   Antimicrobials:  IV Zosyn 05/19/2022>>>>>   Subjective: Patient just returning from HIDA scan.  No chest pain.  No shortness of breath.  No abdominal pain.  Tolerating current diet.  Wife at bedside.   Objective: Vitals:   05/21/22 1531 05/21/22 2020 05/22/22 0426 05/22/22 0756  BP: 121/72 (!) 127/56 135/64 (!) 128/59  Pulse: 74 65 60 65  Resp: 17 18 18 16   Temp: 98.6 F (37 C) 97.6 F (36.4 C) 97.6 F (36.4 C) 97.9 F (36.6 C)  TempSrc: Oral Oral Oral Oral  SpO2: 95% 93% 99% 99%  Weight:      Height:        Intake/Output Summary (Last 24 hours) at 05/22/2022 1306 Last data filed at 05/21/2022 1632 Gross per 24 hour  Intake 322.78 ml  Output --  Net 322.78 ml    Filed Weights   05/19/22 1312  Weight: 83.9 kg    Examination:  General exam: Appears calm and comfortable.  Extremely dry mucous membranes. Respiratory system: Lungs clear to auscultation bilaterally.  No wheezes, no crackles, no rhonchi.  Fair air movement.  Cardiovascular system: Regular rate rhythm no murmurs rubs or gallops.  No JVD.  No lower extremity edema. Gastrointestinal system: Abdomen is soft, nontender, nondistended, positive bowel sounds.  No rebound.  No guarding.   Central nervous system: Alert and oriented. No focal neurological deficits. Extremities: Symmetric 5 x 5 power. Skin: No rashes, lesions or  ulcers Psychiatry: Judgement and insight appear normal. Mood & affect appropriate.     Data Reviewed: I have personally reviewed following labs and imaging studies  CBC: Recent Labs  Lab 05/19/22 1343 05/20/22 0404 05/21/22 1055 05/22/22 0131  WBC 9.9 9.0 9.1 8.4  NEUTROABS  7.4 7.0 6.9 5.8  HGB 14.8 14.2 15.2 13.9  HCT 45.8 44.8 46.5 43.8  MCV 94.4 92.6 91.5 92.4  PLT 259 247 252 268     Basic Metabolic Panel: Recent Labs  Lab 05/19/22 1343 05/20/22 0404 05/21/22 1055 05/22/22 0131  NA 139 137 138 134*  K 3.6 3.4* 3.4* 3.7  CL 102 101 100 102  CO2 26 25 27 24   GLUCOSE 179* 197* 150* 160*  BUN 17 13 12 12   CREATININE 1.36* 1.11 1.14 1.24  CALCIUM 9.6 9.3 9.1 8.4*  MG  --  2.0  --   --   PHOS  --  2.5  --   --      GFR: Estimated Creatinine Clearance: 46 mL/min (by C-G formula based on SCr of 1.24 mg/dL).  Liver Function Tests: Recent Labs  Lab 05/19/22 1343 05/20/22 0404 05/22/22 0131  AST 166* 155* 290*  ALT 151* 163* 264*  ALKPHOS 57 61 78  BILITOT 0.7 0.9 0.5  PROT 6.6 7.0 6.0*  ALBUMIN 2.9* 2.8* 2.3*     CBG: Recent Labs  Lab 05/21/22 1123 05/21/22 1704 05/21/22 2153 05/22/22 0811 05/22/22 1235  GLUCAP 151* 164* 158* 147* 99      No results found for this or any previous visit (from the past 240 hour(s)).       Radiology Studies: NM Hepatobiliary Liver Func  Result Date: 05/22/2022 CLINICAL DATA:  HIDA scan for suspected acute cholecystitis. EXAM: NUCLEAR MEDICINE HEPATOBILIARY IMAGING TECHNIQUE: Sequential images of the abdomen were obtained out to 60 minutes following intravenous administration of radiopharmaceutical. Additional images were acquired after administration of 3 mg of morphine per protocol due to nonvisualization of the gallbladder. RADIOPHARMACEUTICALS:  5.1 mCi Tc-84m  Choletec IV COMPARISON:  None Available. FINDINGS: Propped activity is noted in the liver with biliary excretion and prompt excretion into small bowel. Non filling of the gallbladder is noted at 60 minutes. Subsequent administration of 3 mg of IV morphine was performed on the floor. Repeat imaging while limited by motion artifact shows no filling of the gallbladder. IMPRESSION: Obstructed cystic duct and findings of cholecystitis  correlating well with CT images which were acquired on May 19, 2022. Patent cystic duct. Electronically Signed   By: Zetta Bills M.D.   On: 05/22/2022 11:26        Scheduled Meds:  buPROPion  100 mg Oral q AM   clonazePAM  0.25 mg Oral BID   [START ON 05/23/2022] furosemide  20 mg Oral Daily   insulin aspart  0-5 Units Subcutaneous QHS   insulin aspart  0-9 Units Subcutaneous TID WC   levETIRAcetam  250 mg Oral BID   linagliptin  5 mg Oral QODAY   metoprolol tartrate  12.5 mg Oral BID   morphine (PF)       OLANZapine  20 mg Oral QHS   senna-docusate  1 tablet Oral QHS   Continuous Infusions:  sodium chloride Stopped (05/22/22 1014)   piperacillin-tazobactam 3.375 g (05/22/22 0623)     LOS: 3 days    Time spent: 40 minutes    Irine Seal, MD [facility]   To contact the attending provider between 7A-7P or the covering  provider during after hours 7P-7A, please log into the web site [email or web address] and access using universal [place] password for that web site. If you do not have the password, please call the hospital operator.  05/22/2022, 1:06 PM

## 2022-05-22 NOTE — Progress Notes (Signed)
  RE:  Robert Fisher       Date of Birth: June 14, 2034      Date:   05/22/22       To Whom It May Concern:  Please be advised that the above-named patient will require a short-term nursing home stay - anticipated 30 days or less for rehabilitation and strengthening.  The plan is for return home.                 MD signature                Date

## 2022-05-23 ENCOUNTER — Other Ambulatory Visit (HOSPITAL_COMMUNITY): Payer: Self-pay | Admitting: Radiology

## 2022-05-23 ENCOUNTER — Inpatient Hospital Stay (HOSPITAL_COMMUNITY): Payer: Medicare Other

## 2022-05-23 DIAGNOSIS — R7989 Other specified abnormal findings of blood chemistry: Secondary | ICD-10-CM | POA: Diagnosis not present

## 2022-05-23 DIAGNOSIS — K810 Acute cholecystitis: Secondary | ICD-10-CM

## 2022-05-23 DIAGNOSIS — R600 Localized edema: Secondary | ICD-10-CM | POA: Diagnosis not present

## 2022-05-23 DIAGNOSIS — N182 Chronic kidney disease, stage 2 (mild): Secondary | ICD-10-CM | POA: Diagnosis not present

## 2022-05-23 HISTORY — PX: IR PERC CHOLECYSTOSTOMY: IMG2326

## 2022-05-23 LAB — CBC WITH DIFFERENTIAL/PLATELET
Abs Immature Granulocytes: 0.06 10*3/uL (ref 0.00–0.07)
Basophils Absolute: 0 10*3/uL (ref 0.0–0.1)
Basophils Relative: 0 %
Eosinophils Absolute: 0.2 10*3/uL (ref 0.0–0.5)
Eosinophils Relative: 3 %
HCT: 45.4 % (ref 39.0–52.0)
Hemoglobin: 14.1 g/dL (ref 13.0–17.0)
Immature Granulocytes: 1 %
Lymphocytes Relative: 18 %
Lymphs Abs: 1.4 10*3/uL (ref 0.7–4.0)
MCH: 28.9 pg (ref 26.0–34.0)
MCHC: 31.1 g/dL (ref 30.0–36.0)
MCV: 93 fL (ref 80.0–100.0)
Monocytes Absolute: 1 10*3/uL (ref 0.1–1.0)
Monocytes Relative: 12 %
Neutro Abs: 5.3 10*3/uL (ref 1.7–7.7)
Neutrophils Relative %: 66 %
Platelets: 260 10*3/uL (ref 150–400)
RBC: 4.88 MIL/uL (ref 4.22–5.81)
RDW: 15.8 % — ABNORMAL HIGH (ref 11.5–15.5)
WBC: 8.1 10*3/uL (ref 4.0–10.5)
nRBC: 0 % (ref 0.0–0.2)

## 2022-05-23 LAB — PROTIME-INR
INR: 1.6 — ABNORMAL HIGH (ref 0.8–1.2)
Prothrombin Time: 18.5 seconds — ABNORMAL HIGH (ref 11.4–15.2)

## 2022-05-23 LAB — COMPREHENSIVE METABOLIC PANEL
ALT: 235 U/L — ABNORMAL HIGH (ref 0–44)
AST: 219 U/L — ABNORMAL HIGH (ref 15–41)
Albumin: 2.3 g/dL — ABNORMAL LOW (ref 3.5–5.0)
Alkaline Phosphatase: 83 U/L (ref 38–126)
Anion gap: 9 (ref 5–15)
BUN: 13 mg/dL (ref 8–23)
CO2: 25 mmol/L (ref 22–32)
Calcium: 8.9 mg/dL (ref 8.9–10.3)
Chloride: 104 mmol/L (ref 98–111)
Creatinine, Ser: 1.17 mg/dL (ref 0.61–1.24)
GFR, Estimated: >60 mL/min (ref >60)
Glucose, Bld: 192 mg/dL — ABNORMAL HIGH (ref 70–99)
Potassium: 4 mmol/L (ref 3.5–5.1)
Sodium: 138 mmol/L (ref 135–145)
Total Bilirubin: 0.5 mg/dL (ref 0.3–1.2)
Total Protein: 5.9 g/dL — ABNORMAL LOW (ref 6.5–8.1)

## 2022-05-23 LAB — GLUCOSE, CAPILLARY
Glucose-Capillary: 166 mg/dL — ABNORMAL HIGH (ref 70–99)
Glucose-Capillary: 165 mg/dL — ABNORMAL HIGH (ref 70–99)
Glucose-Capillary: 220 mg/dL — ABNORMAL HIGH (ref 70–99)
Glucose-Capillary: 164 mg/dL — ABNORMAL HIGH (ref 70–99)

## 2022-05-23 MED ORDER — POLYETHYLENE GLYCOL 3350 17 G PO PACK
17.0000 g | PACK | Freq: Every day | ORAL | Status: DC
  Administered 2022-05-23 – 2022-05-26 (×4): 17 g via ORAL
  Filled 2022-05-23 (×5): qty 1

## 2022-05-23 MED ORDER — MIDAZOLAM HCL 2 MG/2ML IJ SOLN
INTRAMUSCULAR | Status: AC
  Filled 2022-05-23: qty 2

## 2022-05-23 MED ORDER — IOHEXOL 300 MG/ML  SOLN
100.0000 mL | Freq: Once | INTRAMUSCULAR | Status: AC | PRN
  Administered 2022-05-23: 15 mL

## 2022-05-23 MED ORDER — LIDOCAINE HCL 1 % IJ SOLN
INTRAMUSCULAR | Status: AC
  Filled 2022-05-23: qty 20

## 2022-05-23 MED ORDER — SENNOSIDES-DOCUSATE SODIUM 8.6-50 MG PO TABS
1.0000 | ORAL_TABLET | Freq: Two times a day (BID) | ORAL | Status: DC
  Administered 2022-05-23 – 2022-05-26 (×7): 1 via ORAL
  Filled 2022-05-23 (×8): qty 1

## 2022-05-23 MED ORDER — CEFAZOLIN SODIUM-DEXTROSE 2-4 GM/100ML-% IV SOLN
2.0000 g | Freq: Once | INTRAVENOUS | Status: AC
Start: 2022-05-23 — End: 2022-05-23

## 2022-05-23 MED ORDER — FENTANYL CITRATE (PF) 100 MCG/2ML IJ SOLN
INTRAMUSCULAR | Status: AC | PRN
  Administered 2022-05-23 (×2): 50 ug via INTRAVENOUS

## 2022-05-23 MED ORDER — MIDAZOLAM HCL 2 MG/2ML IJ SOLN
INTRAMUSCULAR | Status: AC | PRN
  Administered 2022-05-23: 1 mg via INTRAVENOUS

## 2022-05-23 MED ORDER — LIDOCAINE HCL 1 % IJ SOLN
INTRAMUSCULAR | Status: AC | PRN
  Administered 2022-05-23: 10 mL via INTRADERMAL

## 2022-05-23 MED ORDER — CEFAZOLIN SODIUM-DEXTROSE 2-4 GM/100ML-% IV SOLN
INTRAVENOUS | Status: AC
  Filled 2022-05-23: qty 100

## 2022-05-23 MED ORDER — FENTANYL CITRATE (PF) 100 MCG/2ML IJ SOLN
INTRAMUSCULAR | Status: AC
  Filled 2022-05-23: qty 2

## 2022-05-23 MED ORDER — CEFAZOLIN SODIUM-DEXTROSE 1-4 GM/50ML-% IV SOLN: 1.0000 g | Freq: Once | INTRAVENOUS | Status: DC

## 2022-05-23 MED ORDER — SODIUM CHLORIDE 0.9% FLUSH
5.0000 mL | Freq: Three times a day (TID) | INTRAVENOUS | Status: DC
  Administered 2022-05-23 – 2022-05-27 (×12): 5 mL

## 2022-05-23 MED ORDER — CEFAZOLIN SODIUM-DEXTROSE 2-4 GM/100ML-% IV SOLN
INTRAVENOUS | Status: AC | PRN
  Administered 2022-05-23: 2 g via INTRAVENOUS

## 2022-05-23 NOTE — Plan of Care (Signed)
Patient ID: RICO WECKESSER, male   DOB: 1934-01-07, 88 y.o.   MRN: [id number]  Problem: Education: Goal: Ability to describe self-care measures that may prevent or decrease complications (Diabetes Survival Skills Education) will improve Outcome: Progressing Goal: Individualized Educational Video(s) Outcome: Progressing   Problem: Coping: Goal: Ability to adjust to condition or change in health will improve Outcome: Progressing   Problem: Fluid Volume: Goal: Ability to maintain a balanced intake and output will improve Outcome: Progressing   Problem: Health Behavior/Discharge Planning: Goal: Ability to identify and utilize available resources and services will improve Outcome: Progressing Goal: Ability to manage health-related needs will improve Outcome: Progressing   Problem: Metabolic: Goal: Ability to maintain appropriate glucose levels will improve Outcome: Progressing   Problem: Nutritional: Goal: Maintenance of adequate nutrition will improve Outcome: Progressing Goal: Progress toward achieving an optimal weight will improve Outcome: Progressing   Problem: Skin Integrity: Goal: Risk for impaired skin integrity will decrease Outcome: Progressing   Problem: Tissue Perfusion: Goal: Adequacy of tissue perfusion will improve Outcome: Progressing   Problem: Education: Goal: Knowledge of General Education information will improve Description: Including pain rating scale, medication(s)/side effects and non-pharmacologic comfort measures Outcome: Progressing   Problem: Health Behavior/Discharge Planning: Goal: Ability to manage health-related needs will improve Outcome: Progressing   Problem: Clinical Measurements: Goal: Ability to maintain clinical measurements within normal limits will improve Outcome: Progressing Goal: Will remain free from infection Outcome: Progressing Goal: Diagnostic test results will improve Outcome: Progressing Goal: Respiratory  complications will improve Outcome: Progressing Goal: Cardiovascular complication will be avoided Outcome: Progressing   Problem: Activity: Goal: Risk for activity intolerance will decrease Outcome: Progressing   Problem: Nutrition: Goal: Adequate nutrition will be maintained Outcome: Progressing   Problem: Coping: Goal: Level of anxiety will decrease Outcome: Progressing   Problem: Elimination: Goal: Will not experience complications related to bowel motility Outcome: Progressing Goal: Will not experience complications related to urinary retention Outcome: Progressing   Problem: Pain Managment: Goal: General experience of comfort will improve Outcome: Progressing   Problem: Safety: Goal: Ability to remain free from injury will improve Outcome: Progressing   Problem: Skin Integrity: Goal: Risk for impaired skin integrity will decrease Outcome: Progressing    Lidia Collum, RN

## 2022-05-23 NOTE — Progress Notes (Signed)
Mobility Specialist Progress Note   05/23/22 1550  Mobility  Activity Transferred from chair to bed  Level of Assistance +2 (takes two people)  Press photographer wheel walker  Distance Ambulated (ft) 2 ft  Activity Response Tolerated fair  $Mobility charge 1 Mobility   RN requesting assistance to get pt back to bed. +2 modA throughout transfer d/t gen weakness + dec endurance. Requiring verbal cues throughout on posture, foot & hand placement and safety. Pt fatiguing quickly and sitting on EOB prematurely. Reiterated proper and safe sitting mechanics before laying pt supine. No fault or incident observed during tx. Left w/ RN present in room.  Frederico Hamman Mobility Specialist Phone Number [phone]

## 2022-05-24 DIAGNOSIS — K810 Acute cholecystitis: Secondary | ICD-10-CM | POA: Diagnosis not present

## 2022-05-24 DIAGNOSIS — R600 Localized edema: Secondary | ICD-10-CM | POA: Diagnosis not present

## 2022-05-24 DIAGNOSIS — N182 Chronic kidney disease, stage 2 (mild): Secondary | ICD-10-CM | POA: Diagnosis not present

## 2022-05-24 DIAGNOSIS — R7989 Other specified abnormal findings of blood chemistry: Secondary | ICD-10-CM | POA: Diagnosis not present

## 2022-05-24 LAB — GLUCOSE, CAPILLARY
Glucose-Capillary: 151 mg/dL — ABNORMAL HIGH (ref 70–99)
Glucose-Capillary: 127 mg/dL — ABNORMAL HIGH (ref 70–99)
Glucose-Capillary: 218 mg/dL — ABNORMAL HIGH (ref 70–99)
Glucose-Capillary: 224 mg/dL — ABNORMAL HIGH (ref 70–99)

## 2022-05-24 LAB — COMPREHENSIVE METABOLIC PANEL
ALT: 152 U/L — ABNORMAL HIGH (ref 0–44)
AST: 116 U/L — ABNORMAL HIGH (ref 15–41)
Albumin: 2.3 g/dL — ABNORMAL LOW (ref 3.5–5.0)
Alkaline Phosphatase: 70 U/L (ref 38–126)
Anion gap: 10 (ref 5–15)
BUN: 9 mg/dL (ref 8–23)
CO2: 23 mmol/L (ref 22–32)
Calcium: 8.7 mg/dL — ABNORMAL LOW (ref 8.9–10.3)
Chloride: 105 mmol/L (ref 98–111)
Creatinine, Ser: 1.09 mg/dL (ref 0.61–1.24)
GFR, Estimated: >60 mL/min (ref >60)
Glucose, Bld: 187 mg/dL — ABNORMAL HIGH (ref 70–99)
Potassium: 3.9 mmol/L (ref 3.5–5.1)
Sodium: 138 mmol/L (ref 135–145)
Total Bilirubin: 0.7 mg/dL (ref 0.3–1.2)
Total Protein: 5.5 g/dL — ABNORMAL LOW (ref 6.5–8.1)

## 2022-05-24 LAB — PROTIME-INR
INR: 1.4 — ABNORMAL HIGH (ref 0.8–1.2)
Prothrombin Time: 17 seconds — ABNORMAL HIGH (ref 11.4–15.2)

## 2022-05-24 LAB — CBC
HCT: 42.3 % (ref 39.0–52.0)
Hemoglobin: 13.2 g/dL (ref 13.0–17.0)
MCH: 28.9 pg (ref 26.0–34.0)
MCHC: 31.2 g/dL (ref 30.0–36.0)
MCV: 92.6 fL (ref 80.0–100.0)
Platelets: 298 10*3/uL (ref 150–400)
RBC: 4.57 MIL/uL (ref 4.22–5.81)
RDW: 15.6 % — ABNORMAL HIGH (ref 11.5–15.5)
WBC: 6.5 10*3/uL (ref 4.0–10.5)
nRBC: 0 % (ref 0.0–0.2)

## 2022-05-24 MED ORDER — ONDANSETRON HCL 4 MG/2ML IJ SOLN
4.0000 mg | Freq: Four times a day (QID) | INTRAMUSCULAR | Status: DC | PRN
  Administered 2022-05-25 – 2022-05-26 (×2): 4 mg via INTRAVENOUS
  Filled 2022-05-24 (×2): qty 2

## 2022-05-24 MED ORDER — SODIUM CHLORIDE 0.9 % IV SOLN
INTRAVENOUS | Status: DC | PRN
  Administered 2022-05-24: 200 mL via INTRAVENOUS

## 2022-05-25 DIAGNOSIS — R7989 Other specified abnormal findings of blood chemistry: Secondary | ICD-10-CM | POA: Diagnosis not present

## 2022-05-25 DIAGNOSIS — R600 Localized edema: Secondary | ICD-10-CM | POA: Diagnosis not present

## 2022-05-25 DIAGNOSIS — N182 Chronic kidney disease, stage 2 (mild): Secondary | ICD-10-CM | POA: Diagnosis not present

## 2022-05-25 DIAGNOSIS — K810 Acute cholecystitis: Secondary | ICD-10-CM | POA: Diagnosis not present

## 2022-05-25 LAB — COMPREHENSIVE METABOLIC PANEL
ALT: 113 U/L — ABNORMAL HIGH (ref 0–44)
AST: 78 U/L — ABNORMAL HIGH (ref 15–41)
Albumin: 2.4 g/dL — ABNORMAL LOW (ref 3.5–5.0)
Alkaline Phosphatase: 65 U/L (ref 38–126)
Anion gap: 7 (ref 5–15)
BUN: 9 mg/dL (ref 8–23)
CO2: 26 mmol/L (ref 22–32)
Calcium: 9.1 mg/dL (ref 8.9–10.3)
Chloride: 107 mmol/L (ref 98–111)
Creatinine, Ser: 1.1 mg/dL (ref 0.61–1.24)
GFR, Estimated: >60 mL/min (ref >60)
Glucose, Bld: 129 mg/dL — ABNORMAL HIGH (ref 70–99)
Potassium: 3.4 mmol/L — ABNORMAL LOW (ref 3.5–5.1)
Sodium: 140 mmol/L (ref 135–145)
Total Bilirubin: 0.5 mg/dL (ref 0.3–1.2)
Total Protein: 5.8 g/dL — ABNORMAL LOW (ref 6.5–8.1)

## 2022-05-25 LAB — CBC WITH DIFFERENTIAL/PLATELET
Abs Immature Granulocytes: 0.05 10*3/uL (ref 0.00–0.07)
Basophils Absolute: 0 10*3/uL (ref 0.0–0.1)
Basophils Relative: 1 %
Eosinophils Absolute: 0.3 10*3/uL (ref 0.0–0.5)
Eosinophils Relative: 5 %
HCT: 43.3 % (ref 39.0–52.0)
Hemoglobin: 13.6 g/dL (ref 13.0–17.0)
Immature Granulocytes: 1 %
Lymphocytes Relative: 25 %
Lymphs Abs: 1.6 10*3/uL (ref 0.7–4.0)
MCH: 28.9 pg (ref 26.0–34.0)
MCHC: 31.4 g/dL (ref 30.0–36.0)
MCV: 92.1 fL (ref 80.0–100.0)
Monocytes Absolute: 0.7 10*3/uL (ref 0.1–1.0)
Monocytes Relative: 11 %
Neutro Abs: 3.6 10*3/uL (ref 1.7–7.7)
Neutrophils Relative %: 57 %
Platelets: 343 10*3/uL (ref 150–400)
RBC: 4.7 MIL/uL (ref 4.22–5.81)
RDW: 15.6 % — ABNORMAL HIGH (ref 11.5–15.5)
WBC: 6.3 10*3/uL (ref 4.0–10.5)
nRBC: 0 % (ref 0.0–0.2)

## 2022-05-25 LAB — GLUCOSE, CAPILLARY
Glucose-Capillary: 160 mg/dL — ABNORMAL HIGH (ref 70–99)
Glucose-Capillary: 156 mg/dL — ABNORMAL HIGH (ref 70–99)
Glucose-Capillary: 215 mg/dL — ABNORMAL HIGH (ref 70–99)
Glucose-Capillary: 205 mg/dL — ABNORMAL HIGH (ref 70–99)

## 2022-05-25 LAB — PROTIME-INR
INR: 1.3 — ABNORMAL HIGH (ref 0.8–1.2)
Prothrombin Time: 16.4 seconds — ABNORMAL HIGH (ref 11.4–15.2)

## 2022-05-25 LAB — MAGNESIUM: Magnesium: 2.2 mg/dL (ref 1.7–2.4)

## 2022-05-25 MED ORDER — AMOXICILLIN-POT CLAVULANATE 875-125 MG PO TABS
1.0000 | ORAL_TABLET | Freq: Two times a day (BID) | ORAL | Status: DC
  Administered 2022-05-25 – 2022-05-27 (×4): 1 via ORAL
  Filled 2022-05-25 (×4): qty 1

## 2022-05-25 MED ORDER — POTASSIUM CHLORIDE CRYS ER 20 MEQ PO TBCR
20.0000 meq | EXTENDED_RELEASE_TABLET | Freq: Once | ORAL | Status: AC
Start: 2022-05-25 — End: 2022-05-25
  Administered 2022-05-25: 20 meq via ORAL
  Filled 2022-05-25: qty 1

## 2022-05-25 MED ORDER — AMOXICILLIN-POT CLAVULANATE 875-125 MG PO TABS
1.0000 | ORAL_TABLET | Freq: Two times a day (BID) | ORAL | Status: DC
  Filled 2022-05-25: qty 1

## 2022-05-25 MED ORDER — POTASSIUM CHLORIDE 10 MEQ/100ML IV SOLN
10.0000 meq | Freq: Once | INTRAVENOUS | Status: AC
  Administered 2022-05-25: 10 meq via INTRAVENOUS
  Filled 2022-05-25: qty 100

## 2022-05-25 MED ORDER — POTASSIUM CHLORIDE CRYS ER 10 MEQ PO TBCR
40.0000 meq | EXTENDED_RELEASE_TABLET | Freq: Once | ORAL | Status: AC
  Administered 2022-05-25: 40 meq via ORAL
  Filled 2022-05-25: qty 4

## 2022-05-25 NOTE — Plan of Care (Signed)
  Problem: Safety: Goal: Ability to remain free from injury will improve Outcome: Not Progressing   Problem: Skin Integrity: Goal: Risk for impaired skin integrity will decrease Outcome: Not Progressing   Problem: Elimination: Goal: Will not experience complications related to bowel motility Outcome: Not Progressing   Problem: Elimination: Goal: Will not experience complications related to urinary retention Outcome: Not Progressing

## 2022-05-25 NOTE — Progress Notes (Signed)
DR. Antionette Char updated of different Cardiac rhythm switching from Sinus Bradycardia, AIVR and junctional. Patient had several run of Vtach as well all through the shift  that Dr. Antionette Char has been made aware. EKG done earlier shhows Sinus bradycardia. Of note patient is asleep and resting with all the episodes.

## 2022-05-25 NOTE — Progress Notes (Signed)
Patient ID: Robert Fisher, male   DOB: [birth year], 88 y.o.   MRN: [id number] [facility] Surgery Progress Note     Subjective: No acute changes. Denies abdominal pain. Afebrile, WBC remains normal. LFTs continue to downtrend.  Objective: Vital signs in last 24 hours: Temp:  [97.3 F (36.3 C)-98.3 F (36.8 C)] 97.9 F (36.6 C) (06/25 0931) Pulse Rate:  [49-78] 72 (06/25 0931) Resp:  [18] 18 (06/25 0931) BP: (101-148)/(46-74) 148/74 (06/25 0931) SpO2:  [95 %-99 %] 97 % (06/25 0931) Weight:  [84.7 kg] 84.7 kg (06/25 0312) Last BM Date : 05/25/22  Intake/Output from previous day: 06/24 0701 - 06/25 0700 In: 2110.9 [P.O.:1906; I.V.:0.5; IV Piggyback:184.4] Out: 1115 [Urine:1050; Drains:65] Intake/Output this shift: Total I/O In: 240 [P.O.:240] Out: 300 [Urine:300]  PE: Gen:  Alert, NAD, more alert today Abd: soft, nondistended, nontender. RUQ perc chole with turbid serosanguinous fluid, nonbilious.  Lab Results:  Recent Labs    05/24/22 0134 05/25/22 0118  WBC 6.5 6.3  HGB 13.2 13.6  HCT 42.3 43.3  PLT 298 343   BMET Recent Labs    05/24/22 0134 05/25/22 0118  NA 138 140  K 3.9 3.4*  CL 105 107  CO2 23 26  GLUCOSE 187* 129*  BUN 9 9  CREATININE 1.09 1.10  CALCIUM 8.7* 9.1   PT/INR Recent Labs    05/24/22 0134 05/25/22 0118  LABPROT 17.0* 16.4*  INR 1.4* 1.3*   CMP     Component Value Date/Time   NA 140 05/25/2022 0118   K 3.4 (L) 05/25/2022 0118   CL 107 05/25/2022 0118   CO2 26 05/25/2022 0118   GLUCOSE 129 (H) 05/25/2022 0118   BUN 9 05/25/2022 0118   CREATININE 1.10 05/25/2022 0118   CREATININE 1.26 (H) 07/11/2021 1412   CALCIUM 9.1 05/25/2022 0118   PROT 5.8 (L) 05/25/2022 0118   ALBUMIN 2.4 (L) 05/25/2022 0118   AST 78 (H) 05/25/2022 0118   ALT 113 (H) 05/25/2022 0118   ALKPHOS 65 05/25/2022 0118   BILITOT 0.5 05/25/2022 0118   GFRNONAA >60 05/25/2022 0118   GFRNONAA 56 (L) 07/11/2021 1412   GFRAA 58 (L) 06/22/2020 1355    Lipase     Component Value Date/Time   LIPASE 29 05/19/2022 1343       Studies/Results: No results found.  Anti-infectives: Anti-infectives (From admission, onward)    Start     Dose/Rate Route Frequency Ordered Stop   05/25/22 1330  amoxicillin-clavulanate (AUGMENTIN) 875-125 MG per tablet 1 tablet        1 tablet Oral Every 12 hours 05/25/22 1240     05/23/22 1045  ceFAZolin (ANCEF) IVPB 2g/100 mL premix        2 g 200 mL/hr over 30 Minutes Intravenous  Once 05/23/22 0951 05/23/22 1054   05/23/22 1030  ceFAZolin (ANCEF) IVPB 1 g/50 mL premix  Status:  Discontinued        1 g 100 mL/hr over 30 Minutes Intravenous  Once 05/23/22 0943 05/23/22 0951   05/23/22 1017  ceFAZolin (ANCEF) IVPB 2g/100 mL premix        over 30 Minutes Intravenous Continuous PRN 05/23/22 1018 05/23/22 1017   05/19/22 2200  piperacillin-tazobactam (ZOSYN) IVPB 3.375 g  Status:  Discontinued        3.375 g 12.5 mL/hr over 240 Minutes Intravenous Every 8 hours 05/19/22 2016 05/25/22 1240   05/19/22 1930  piperacillin-tazobactam (ZOSYN) IVPB 3.375 g  3.375 g 100 mL/hr over 30 Minutes Intravenous  Once 05/19/22 1919 05/19/22 2018        Assessment/Plan Acute cholecystitis  - S/p percutaneous cholecystostomy 6/23. - LFTs downtrending - ID - Zosyn started 6/19. Transition to oral Augmentin today, plan for a 7-day course of antibiotics after placement of perc chole. FEN - Advance diet as tolerated VTE - SCDs, anticoagulation per primary team  I reviewed hospitalist notes, last 24 h vitals and pain scores, last 48 h intake and output, last 24 h labs and trends, and last 24 h imaging results    LOS: 6 days    Fritzi Mandes, MD [facility] Surgery 05/25/2022, 12:40 PM Please see Amion for pager number during day hours 7:00am-4:30pm

## 2022-05-26 DIAGNOSIS — R7989 Other specified abnormal findings of blood chemistry: Secondary | ICD-10-CM | POA: Diagnosis not present

## 2022-05-26 DIAGNOSIS — K810 Acute cholecystitis: Secondary | ICD-10-CM | POA: Diagnosis not present

## 2022-05-26 DIAGNOSIS — W19XXXA Unspecified fall, initial encounter: Secondary | ICD-10-CM

## 2022-05-26 DIAGNOSIS — R600 Localized edema: Secondary | ICD-10-CM | POA: Diagnosis not present

## 2022-05-26 DIAGNOSIS — F200 Paranoid schizophrenia: Secondary | ICD-10-CM

## 2022-05-26 DIAGNOSIS — N182 Chronic kidney disease, stage 2 (mild): Secondary | ICD-10-CM | POA: Diagnosis not present

## 2022-05-26 LAB — COMPREHENSIVE METABOLIC PANEL
ALT: 92 U/L — ABNORMAL HIGH (ref 0–44)
AST: 56 U/L — ABNORMAL HIGH (ref 15–41)
Albumin: 2.6 g/dL — ABNORMAL LOW (ref 3.5–5.0)
Alkaline Phosphatase: 66 U/L (ref 38–126)
Anion gap: 7 (ref 5–15)
BUN: 8 mg/dL (ref 8–23)
CO2: 26 mmol/L (ref 22–32)
Calcium: 9.1 mg/dL (ref 8.9–10.3)
Chloride: 105 mmol/L (ref 98–111)
Creatinine, Ser: 1.11 mg/dL (ref 0.61–1.24)
GFR, Estimated: >60 mL/min (ref >60)
Glucose, Bld: 177 mg/dL — ABNORMAL HIGH (ref 70–99)
Potassium: 4 mmol/L (ref 3.5–5.1)
Sodium: 138 mmol/L (ref 135–145)
Total Bilirubin: 0.5 mg/dL (ref 0.3–1.2)
Total Protein: 6.2 g/dL — ABNORMAL LOW (ref 6.5–8.1)

## 2022-05-26 LAB — CBC WITH DIFFERENTIAL/PLATELET
Abs Immature Granulocytes: 0.11 10*3/uL — ABNORMAL HIGH (ref 0.00–0.07)
Basophils Absolute: 0.1 10*3/uL (ref 0.0–0.1)
Basophils Relative: 1 %
Eosinophils Absolute: 0.2 10*3/uL (ref 0.0–0.5)
Eosinophils Relative: 2 %
HCT: 47 % (ref 39.0–52.0)
Hemoglobin: 14.8 g/dL (ref 13.0–17.0)
Immature Granulocytes: 1 %
Lymphocytes Relative: 17 %
Lymphs Abs: 1.3 10*3/uL (ref 0.7–4.0)
MCH: 29.2 pg (ref 26.0–34.0)
MCHC: 31.5 g/dL (ref 30.0–36.0)
MCV: 92.9 fL (ref 80.0–100.0)
Monocytes Absolute: 0.8 10*3/uL (ref 0.1–1.0)
Monocytes Relative: 10 %
Neutro Abs: 5.5 10*3/uL (ref 1.7–7.7)
Neutrophils Relative %: 69 %
Platelets: 365 10*3/uL (ref 150–400)
RBC: 5.06 MIL/uL (ref 4.22–5.81)
RDW: 15.7 % — ABNORMAL HIGH (ref 11.5–15.5)
WBC: 7.9 10*3/uL (ref 4.0–10.5)
nRBC: 0 % (ref 0.0–0.2)

## 2022-05-26 LAB — GLUCOSE, CAPILLARY
Glucose-Capillary: 161 mg/dL — ABNORMAL HIGH (ref 70–99)
Glucose-Capillary: 298 mg/dL — ABNORMAL HIGH (ref 70–99)
Glucose-Capillary: 181 mg/dL — ABNORMAL HIGH (ref 70–99)

## 2022-05-26 LAB — PROTIME-INR
INR: 1.3 — ABNORMAL HIGH (ref 0.8–1.2)
Prothrombin Time: 16 seconds — ABNORMAL HIGH (ref 11.4–15.2)

## 2022-05-26 LAB — MAGNESIUM: Magnesium: 2.2 mg/dL (ref 1.7–2.4)

## 2022-05-26 MED ORDER — LEVETIRACETAM 250 MG PO TABS
250.0000 mg | ORAL_TABLET | Freq: Two times a day (BID) | ORAL | Status: DC
  Administered 2022-05-26 – 2022-05-27 (×2): 250 mg via ORAL
  Filled 2022-05-26 (×4): qty 1

## 2022-05-26 NOTE — Progress Notes (Signed)
Physical Therapy Treatment Patient Details Name: Robert Fisher MRN: [id number] DOB: 1934/09/21 Today's Date: 05/26/2022   History of Present Illness Pt is 88 y/o male who presented to [facility] ED from home due to progressively worsening bilateral lower extremity edema despite taking home diuretics, also reports fall from w/c. PMH includes: HTN, type 2 diabetes, hyperlipidemia, DVT/PE on Coumadin, chronic bilateral lower extremity edema on furosemide 4 times a week and HCTZ daily, COPD, CKD 2, lung cancer, paranoid schizophrenia, and seizure disorder followed by neurology. Placement of cholecystostomy tube on 06/23.    PT Comments    Pt instructed in and performed therapeutic activities and exercises. Pt with improved ability to generate LE force during sit to stands but struggles to maintain upright during standing with RW. Plan to progress to gait training with RW and chair follow.   Recommendations for follow up therapy are one component of a multi-disciplinary discharge planning process, led by the attending physician.  Recommendations may be updated based on patient status, additional functional criteria and insurance authorization.  Follow Up Recommendations  Skilled nursing-short term rehab (<3 hours/day) Can patient physically be transported by private vehicle: Yes   Assistance Recommended at Discharge Frequent or constant Supervision/Assistance  Patient can return home with the following A lot of help with bathing/dressing/bathroom;Assistance with cooking/housework;Assistance with feeding;Assist for transportation;Direct supervision/assist for financial management;Direct supervision/assist for medications management;Help with stairs or ramp for entrance;A lot of help with walking and/or transfers   Equipment Recommendations  Wheelchair (measurements PT);Wheelchair cushion (measurements PT) (if to go home with 24/7 assist)    Recommendations for Other Services       Precautions /  Restrictions Precautions Precautions: Fall Precaution Comments: h/o seizure 8-9 months ago per son in law; partial-thickness wound to buttocks; high INR Restrictions Weight Bearing Restrictions: No     Mobility  Bed Mobility Overal bed mobility: Needs Assistance Bed Mobility: Supine to Sit     Supine to sit: Mod assist, HOB elevated     General bed mobility comments: Pt utilized HHA and bed rail on R to perform supine to sit transfer. Pt able to sit up at EOB and maintain static sitting balance without assistance.    Transfers Overall transfer level: Needs assistance Equipment used: Rolling walker (2 wheels) Transfers: Sit to/from Stand, Bed to chair/wheelchair/BSC Sit to Stand: Min assist Stand pivot transfers: Mod assist         General transfer comment: Pt performed four sit <> stands with RW (short rest breaks between each). Pt with flexed posture and only able to minimally improve upon positioning with cues. Pt also performed stand pivot transfer from bed to chair while holding on therapist's elbows; pt able to stand more erect during this transfer although unsteadiness present.    Ambulation/Gait               General Gait Details: did not perform   Stairs             Wheelchair Mobility    Modified Rankin (Stroke Patients Only)       Balance Overall balance assessment: Needs assistance, History of Falls Sitting-balance support: No upper extremity supported, Feet supported Sitting balance-Leahy Scale: Fair Sitting balance - Comments: Pt able to hold himself up sitting EOB without UE support when instructed (utilized arms initially).   Standing balance support: Bilateral upper extremity supported Standing balance-Leahy Scale: Poor  Cognition Arousal/Alertness: Awake/alert Behavior During Therapy: Flat affect Overall Cognitive Status: History of cognitive impairments - at baseline (Improved compared to  evaluation as pt more conversational)                                 General Comments: able to follow one step commands with increased time, some decreased initiation. speech is difficult to understand (baseline per SIL)        Exercises General Exercises - Lower Extremity Heel Slides: Both, 10 reps, Supine Hip ABduction/ADduction: Both, 10 reps, Supine (pillow adduction squeezes) Straight Leg Raises: Both, 10 reps, Supine    General Comments        Pertinent Vitals/Pain Pain Assessment Pain Assessment: No/denies pain    Home Living                          Prior Function            PT Goals (current goals can now be found in the care plan section) Acute Rehab PT Goals Patient Stated Goal: Unable to verbalize any goals. PT Goal Formulation: With patient/family Time For Goal Achievement: 06/04/22 Potential to Achieve Goals: Fair Progress towards PT goals: Progressing toward goals    Frequency    Min 3X/week      PT Plan Current plan remains appropriate    Co-evaluation              AM-PAC PT "6 Clicks" Mobility   Outcome Measure  Help needed turning from your back to your side while in a flat bed without using bedrails?: A Little Help needed moving from lying on your back to sitting on the side of a flat bed without using bedrails?: A Lot Help needed moving to and from a bed to a chair (including a wheelchair)?: A Lot Help needed standing up from a chair using your arms (e.g., wheelchair or bedside chair)?: A Little Help needed to walk in hospital room?: Total Help needed climbing 3-5 steps with a railing? : Total 6 Click Score: 12    End of Session Equipment Utilized During Treatment: Gait belt Activity Tolerance: Patient tolerated treatment well;Patient limited by fatigue (Patient tolerated treatment fair.) Patient left: in chair;with call bell/phone within reach;with chair alarm set;with family/visitor present Nurse  Communication: Mobility status;Precautions PT Visit Diagnosis: Muscle weakness (generalized) (M62.81);History of falling (Z91.81);Adult, failure to thrive (R62.7)     Time: 0981-1914 PT Time Calculation (min) (ACUTE ONLY): 28 min  Charges:  $Therapeutic Exercise: 8-22 mins $Therapeutic Activity: 8-22 mins                     Tana Coast, PT    [employer] 05/26/2022, 1:52 PM

## 2022-05-26 NOTE — TOC CM/SW Note (Signed)
    Durable Medical Equipment  (From admission, onward)           Start     Ordered   05/26/22 1335  For home use only DME standard manual wheelchair with seat cushion  Once       Comments: Patient suffers from  weakness and S/p percutaneous cholecystostomy 6/23. which impairs their ability to perform daily activities like ambulating  in the home.  A cane  will not resolve issue with performing activities of daily living. A wheelchair will allow patient to safely perform daily activities. Patient can safely propel the wheelchair in the home or has a caregiver who can provide assistance. Length of need lifetime . Accessories: elevating leg rests (ELRs), wheel locks, extensions and anti-tippers.  Seat and back cushions   05/26/22 1335

## 2022-05-27 ENCOUNTER — Other Ambulatory Visit (HOSPITAL_COMMUNITY): Payer: Self-pay

## 2022-05-27 DIAGNOSIS — R600 Localized edema: Secondary | ICD-10-CM | POA: Diagnosis not present

## 2022-05-27 DIAGNOSIS — N182 Chronic kidney disease, stage 2 (mild): Secondary | ICD-10-CM | POA: Diagnosis not present

## 2022-05-27 DIAGNOSIS — R7989 Other specified abnormal findings of blood chemistry: Secondary | ICD-10-CM | POA: Diagnosis not present

## 2022-05-27 DIAGNOSIS — K810 Acute cholecystitis: Secondary | ICD-10-CM | POA: Diagnosis not present

## 2022-05-27 LAB — COMPREHENSIVE METABOLIC PANEL
ALT: 72 U/L — ABNORMAL HIGH (ref 0–44)
AST: 51 U/L — ABNORMAL HIGH (ref 15–41)
Albumin: 2.4 g/dL — ABNORMAL LOW (ref 3.5–5.0)
Alkaline Phosphatase: 60 U/L (ref 38–126)
Anion gap: 5 (ref 5–15)
BUN: 8 mg/dL (ref 8–23)
CO2: 25 mmol/L (ref 22–32)
Calcium: 8.8 mg/dL — ABNORMAL LOW (ref 8.9–10.3)
Chloride: 107 mmol/L (ref 98–111)
Creatinine, Ser: 0.94 mg/dL (ref 0.61–1.24)
GFR, Estimated: >60 mL/min (ref >60)
Glucose, Bld: 169 mg/dL — ABNORMAL HIGH (ref 70–99)
Potassium: 3.8 mmol/L (ref 3.5–5.1)
Sodium: 137 mmol/L (ref 135–145)
Total Bilirubin: 0.6 mg/dL (ref 0.3–1.2)
Total Protein: 5.5 g/dL — ABNORMAL LOW (ref 6.5–8.1)

## 2022-05-27 LAB — GLUCOSE, CAPILLARY
Glucose-Capillary: 179 mg/dL — ABNORMAL HIGH (ref 70–99)
Glucose-Capillary: 176 mg/dL — ABNORMAL HIGH (ref 70–99)
Glucose-Capillary: 188 mg/dL — ABNORMAL HIGH (ref 70–99)
Glucose-Capillary: 219 mg/dL — ABNORMAL HIGH (ref 70–99)

## 2022-05-27 LAB — PROTIME-INR
INR: 1.3 — ABNORMAL HIGH (ref 0.8–1.2)
Prothrombin Time: 15.9 seconds — ABNORMAL HIGH (ref 11.4–15.2)

## 2022-05-27 MED ORDER — AMOXICILLIN-POT CLAVULANATE 875-125 MG PO TABS
1.0000 | ORAL_TABLET | Freq: Two times a day (BID) | ORAL | 0 refills | Status: AC
  Filled 2022-05-27: qty 8, 4d supply, fill #0

## 2022-05-27 MED ORDER — ATORVASTATIN CALCIUM 40 MG PO TABS: 40.0000 mg | ORAL_TABLET | Freq: Every day | ORAL | 2 refills | Status: DC

## 2022-05-27 MED ORDER — OXYCODONE HCL 5 MG PO TABS
5.0000 mg | ORAL_TABLET | Freq: Four times a day (QID) | ORAL | 0 refills | Status: DC | PRN
Start: 2022-05-27 — End: 2022-08-30
  Filled 2022-05-27: qty 12, 3d supply, fill #0

## 2022-05-28 LAB — AEROBIC/ANAEROBIC CULTURE W GRAM STAIN (SURGICAL/DEEP WOUND)
Culture: NO GROWTH
Gram Stain: NONE SEEN

## 2022-06-06 ENCOUNTER — Other Ambulatory Visit: Payer: Medicare Other | Admitting: *Deleted

## 2022-06-06 DIAGNOSIS — Z515 Encounter for palliative care: Secondary | ICD-10-CM

## 2022-06-06 NOTE — Progress Notes (Signed)
[facility] COMMUNITY PALLIATIVE CARE RN NOTE  PATIENT NAME: ALGIE WESTRY DOB: 1934/06/10 MRN: [id number]  PRIMARY CARE PROVIDER: Shon Baton, MD  RESPONSIBLE PARTY: [name] (daughter) Acct ID - Guarantor Home Phone Work Phone Relationship Acct Type  [id number] JAHEL, [phone]  Self P/F     [place], [place], [place] [address]   RN telephonic encounter completed with patient's nurse practitioner [facility] with [facility] (with [facility]). She reports recently doing a home visit with patient and daughter is very overwhelmed and feels that she needs more resources for patient. He is falling more at home. He had a caregiver in place but that didn't work out well. He was denied VA benefits. Daughter is staying with patient and trying to work as well. Melissa feels that patient is on the border between palliative vs hospice care. He has a visit already scheduled with Dr. Mariea Clonts on 06/19/22 but Lenna Sciara feels he could benefit from an earlier visit. Palliative team will reach out to daughter and schedule a visit for next week.    Daryl Eastern, RN BSN

## 2022-06-09 ENCOUNTER — Encounter: Payer: Self-pay | Admitting: Neurology

## 2022-06-09 ENCOUNTER — Other Ambulatory Visit: Payer: Medicare Other | Admitting: *Deleted

## 2022-06-09 DIAGNOSIS — Z515 Encounter for palliative care: Secondary | ICD-10-CM

## 2022-06-09 NOTE — Progress Notes (Signed)
[facility] COMMUNITY PALLIATIVE CARE RN NOTE  PATIENT NAME: Robert Fisher DOB: 02-05-1934 MRN: [id number]  PRIMARY CARE PROVIDER: Shon Baton, MD  RESPONSIBLE PARTY: [name] (daughter) Acct ID - Guarantor Home Phone Work Phone Relationship Acct Type  [id number] DOUGLAS, [phone]  Self P/F     [place], [place], [place] [address]   RN telephonic encounter completed with [name]. Spoke with Lenna Sciara NP with [facility] with [facility] who felt that patient would need an earlier visit than what is currently scheduled with Dr. Mariea Clonts on 06/19/22. She says that today patient seems to be ok and has a lot of appointments scheduled this week. He met with a SW today, PT evaluation is tomorrow with [facility] and Wednesday he is going to PCP office to meet with Dr. Keane Police assistant regarding blood sugars and then to his Psychiatrist. She feels that he can keep the 06/19/22 appointment and knows she can contact palliative care with questions/concerns. We did discuss the differences between palliative care and hospice. At this time daughter wishes to continue with palliative care as patient is to start PT/OT and she wants to see how this helps him. She also says his gall bladder infection has cleared. She also says patient now has a motorized chair and has been using this for the past 4 days and likes this. It makes it easier for daughter not to have to push him in a wheelchair to the bathroom.  The main issue he is experiencing is constipation. Today is Day 4 of no BM. He has been taking Miralax for the past 3 days along with Ex-lax, eating prunes and drinking prune juice. He has had good fluid intake. She is getting concerned and wants to know what else she could try for him.   I discussed this with Dr. Mariea Clonts who suggests he try something rectally such as Dulcolax suppositories and he can also increase his Miralax to twice daily until he has results. Communication sent to  daughter with this information.    Daryl Eastern, RN BSN

## 2022-06-10 ENCOUNTER — Telehealth: Payer: Self-pay | Admitting: Internal Medicine

## 2022-06-10 ENCOUNTER — Other Ambulatory Visit: Payer: Self-pay | Admitting: Internal Medicine

## 2022-06-10 ENCOUNTER — Other Ambulatory Visit (HOSPITAL_COMMUNITY): Payer: Self-pay

## 2022-06-10 MED ORDER — SODIUM CHLORIDE FLUSH 0.9 % IV SOLN
10.0000 mL | INTRAVENOUS | 2 refills | Status: AC | PRN
  Filled 2022-06-10: qty 300, 30d supply, fill #0

## 2022-06-10 NOTE — Progress Notes (Signed)
Pt daughter called and requested saline flushes for her father's drain. Pt daughter advised that prescription for flushes would be sent to [facility] pharmacy. She was provided with phone number and location of the pharmacy.     Narda Rutherford, AGNP-BC 06/10/2022, 12:32 PM

## 2022-06-11 ENCOUNTER — Other Ambulatory Visit (HOSPITAL_COMMUNITY): Payer: Self-pay

## 2022-06-12 ENCOUNTER — Telehealth: Payer: Medicare Other | Admitting: Internal Medicine

## 2022-06-13 ENCOUNTER — Ambulatory Visit: Payer: Medicare Other | Admitting: Podiatry

## 2022-06-18 ENCOUNTER — Ambulatory Visit: Payer: Medicare Other | Admitting: Podiatry

## 2022-06-19 ENCOUNTER — Other Ambulatory Visit: Payer: Medicare Other | Admitting: Internal Medicine

## 2022-06-19 DIAGNOSIS — K5904 Chronic idiopathic constipation: Secondary | ICD-10-CM

## 2022-06-19 DIAGNOSIS — F200 Paranoid schizophrenia: Secondary | ICD-10-CM

## 2022-06-19 DIAGNOSIS — M4802 Spinal stenosis, cervical region: Secondary | ICD-10-CM

## 2022-06-19 DIAGNOSIS — K810 Acute cholecystitis: Secondary | ICD-10-CM

## 2022-06-19 DIAGNOSIS — C3412 Malignant neoplasm of upper lobe, left bronchus or lung: Secondary | ICD-10-CM

## 2022-06-19 DIAGNOSIS — Z515 Encounter for palliative care: Secondary | ICD-10-CM

## 2022-06-19 NOTE — Progress Notes (Signed)
[facility] Pall[facility] Care Follow-Up Visit Telephone: [phone]  Fax: [phone]   Date of encounter: 06/19/22 7:40 AM PATIENT NAME: Robert Fisher [place] [address]   [phone] (home)  DOB: [birth year] MRN: [id number] PRIMARY CARE PROVIDER:    Shon Baton, MD,  [place] [address] [phone]  REFERRING PROVIDER:   Shon Baton, [place],   [address] [phone]  RESPONSIBLE PARTY:    Contact Information     Name Relation Home Work [place] Daughter   [phone]   Atha, Mcbain [phone]   Tipps,debbie Daughter [phone]  [phone]        I met face to face with patient and family in his home. Palliative Care was asked to follow this patient by consultation request of  Shon Baton, MD to address advance care planning and complex medical decision making. This is follow-up visit.                                     ASSESSMENT AND PLAN / RECOMMENDATIONS:   Advance Care Planning/Goals of Care: Goals include to maximize quality of life and symptom management. Patient/health care surrogate gave his/her permission to discuss.Our advance care planning conversation included a discussion about:    The value and importance of advance care planning  Experiences with loved ones who have been seriously ill or have died  Exploration of personal, cultural or spiritual beliefs that might influence medical decisions  Exploration of goals of care in the event of a sudden injury or illness  Identification  of a healthcare agent--wife and dtr Review and updating or creation of an  advance directive document . Decision not to resuscitate or to de-escalate disease focused treatments due to poor prognosis. CODE STATUS:  DNR, MOST completed today--apparently did not properly upload to onedrive to be added to vynca so will need to  upload next visit (?internet issue)   Symptom Management/Plan: 1. Paranoid schizophrenia in remission ([place]) -no current concerns for many years in this realm, his daughter and SIL primarily help with decision-making but pt does seem to have good insight into his declining health and was able to contribute and sign MOST today--he seems to be accepting of hospice; however, his family was not ready  2. Cervical spinal stenosis -affecting mobility  -continues with primarily use of wheelchair, some struggles getting out of bed but often uses recliner instead of sleeping in bedroom, does have grab bar on side of bed in case he does rest in there, had episode of falling out of bed which impacted this -continue tylenol primarily with occasional oxycodone for severe pain per primary team  3. Malignant neoplasm of upper lobe of left lung ([place]) -noted incidentally during cholecystitis admission -continue to monitor--pt and family elected not to pursue any eval and not recommended with his comorbidities  4. Acute cholecystitis -has drain in place--infection has cleared for time being at least  5. Chronic idiopathic constipation -doing better after we recommended daily miralax   6. Palliative care encounter -continue to follow -will check INRs for pt if Dr. Virgina Jock and Ladona Horns are on board with this as pt's mobility is very minimal now and his daughter is trying to avoid him having to go out often--on warfarin for PE, family also asking about hba1c but not known if truly due at this  time -Merrie was going to reach out to primary care office -also pt and family plan some renovations in the home to help his mobility with the wheelchair -continue 24x7 supervision in addition to his wife (as she is too frail to help physically)    Follow up Palliative Care Visit: Palliative care will continue to follow for complex medical decision making, advance care planning, and clarification of goals. Return 08/14/2022  and prn.  This visit was coded based on medical decision making (MDM).28 mins spent on ACP, MOST.  PPS: 40%  HOSPICE ELIGIBILITY/DIAGNOSIS: Yes/lung cancer   Chief Complaint: Follow-up palliative visit  HISTORY OF PRESENT ILLNESS:  Robert Fisher is a 88 y.o. year old male  with lung mass, cholecystitis with drain, venous insufficiency, cervical spinal stenosis, schizoaffective schizophrenia in remission, DMII with CKD3b among others seen in palliative f/u at home along with his wife and daughter in the home.   He's had difficulty with constipation for which Merrie called our nurse and we provided recommendations--doing better now.    He had some discomfort at his gallbladder drain that improved after flushing.    He's more mobile in the home with some therapy.  His daughter wants him to work on getting stronger though he's not very enthusiastic about this and seems to want to be left alone.  She worries she won't be able to care for him at home if he's not as strong.  He is accepting of hospice, but she's not ready.    He had a bedsore that's doing better with cleansing and desitin after bms.    He's only taken 1/2 an oxycodone once so far.Used tramadol for his buttocks pain twice.  Usually uses tylenol b/c the others make him a little groggy.   History obtained from review of EMR, discussion with primary team, and interview with family, facility staff/caregiver and/or Robert Fisher.  I reviewed available labs, medications, imaging, studies and related documents from the EMR.  Records reviewed and summarized above.   ROS Review of Systems see hpi  Physical Exam: There were no vitals filed for this visit. There is no height or weight on file to calculate BMI. Wt Readings from Last 500 Encounters:  05/27/22 192 lb 10.9 oz (87.4 kg)  04/21/22 185 lb (83.9 kg)  04/02/22 185 lb (83.9 kg)  03/29/22 176 lb (79.8 kg)  03/28/22 176 lb 5.9 oz (80 kg)  08/12/21 176 lb (79.8 kg)   08/08/21 175 lb (79.4 kg)  07/30/21 175 lb (79.4 kg)  04/23/20 201 lb 1 oz (91.2 kg)  03/03/19 201 lb 1.6 oz (91.2 kg)  03/03/19 201 lb 1.6 oz (91.2 kg)  01/25/13 205 lb (93 kg)   Physical Exam Constitutional:      Appearance: Normal appearance. He is obese.  HENT:     Head: Normocephalic and atraumatic.  Cardiovascular:     Rate and Rhythm: Normal rate and regular rhythm.  Pulmonary:     Effort: Pulmonary effort is normal.     Breath sounds: Normal breath sounds. No wheezing, rhonchi or rales.  Abdominal:     General: Bowel sounds are normal. There is no distension.     Palpations: Abdomen is soft.     Tenderness: There is no abdominal tenderness.     Comments: Drain in place with dark gold fluid  Musculoskeletal:        General: Normal range of motion.     Right lower leg: Edema present.  Left lower leg: Edema present.  Skin:    General: Skin is warm and dry.     Comments: Mild erythema of shins  Neurological:     General: No focal deficit present.     Mental Status: He is alert and oriented to person, place, and time.     Motor: Weakness present.  Psychiatric:        Mood and Affect: Mood normal.     CURRENT PROBLEM LIST:  Patient Active Problem List   Diagnosis Date Noted   Seizure ([place]) 05/21/2022   CKD (chronic kidney disease) stage 2, GFR 60-89 ml/min 05/21/2022   DM (diabetes mellitus), type 2 ([place]) 05/21/2022   Constipation 05/21/2022   Bilateral leg edema    Elevated INR    LFT elevation    Acute cholecystitis 05/19/2022   Essential hypertension 04/02/2022   Lower extremity edema 04/02/2022   Hallux malleus 04/01/2021   Malignant neoplasm of upper lobe of left lung ([place]) 06/25/2020   Dermatochalasis of both lower eyelids 10/06/2017   Dermatochalasis of both upper eyelids 10/06/2017   Melanoma of back ([place]) 10/18/2013   ADVEF, DRUG/MEDICINAL/BIOLOGICAL SUBST NOS 08/30/2007   HYPERLIPIDEMIA NEC/NOS 06/08/2007   Paranoid schizophrenia in remission  ([place]) 03/19/2007   PERIPHERAL VASCULAR DISEASE 03/19/2007   SKIN CANCER, HX OF 03/19/2007   PULMONARY EMBOLISM, HX OF 03/19/2007    PAST MEDICAL HISTORY:  Active Ambulatory Problems    Diagnosis Date Noted   HYPERLIPIDEMIA NEC/NOS 06/08/2007   Paranoid schizophrenia in remission ([place]) 03/19/2007   PERIPHERAL VASCULAR DISEASE 03/19/2007   ADVEF, DRUG/MEDICINAL/BIOLOGICAL SUBST NOS 08/30/2007   SKIN CANCER, HX OF 03/19/2007   PULMONARY EMBOLISM, HX OF 03/19/2007   Malignant neoplasm of upper lobe of left lung ([place]) 06/25/2020   Hallux malleus 04/01/2021   Essential hypertension 04/02/2022   Lower extremity edema 04/02/2022   Dermatochalasis of both lower eyelids 10/06/2017   Dermatochalasis of both upper eyelids 10/06/2017   Melanoma of back ([place]) 10/18/2013   Acute cholecystitis 05/19/2022   Seizure ([place]) 05/21/2022   CKD (chronic kidney disease) stage 2, GFR 60-89 ml/min 05/21/2022   DM (diabetes mellitus), type 2 ([place]) 05/21/2022   Constipation 05/21/2022   Bilateral leg edema    Elevated INR    LFT elevation    Resolved Ambulatory Problems    Diagnosis Date Noted   Lung nodule 03/03/2019   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 [facility]) 03/30/2019   Past Medical History:  Diagnosis Date   Borderline diabetes    Cancer (HCC)    COPD (chronic obstructive pulmonary disease) ([place])    Diabetes mellitus without complication ([place])    DVT (deep venous thrombosis) ([place])    Full dentures    Hearing aid worn    HOH (hard of hearing)    Hypertension    Left upper lobe pulmonary nodule    Memory problem    Pneumonia    Schizo affective schizophrenia ([place])    Seizures ([place])     SOCIAL HX:  Social History   Tobacco Use   Smoking status: Former    Packs/day: 1.00    Years: 50.00    Total pack years: 50.00    Types: Cigarettes, Pipe    Start date: 23    Quit date: 03/03/2015    Years since quitting: 7.3   Smokeless  tobacco: Never   Tobacco comments:    30 yearsv cigarettes ; pipe 20 years  Substance Use  Topics   Alcohol use: No     ALLERGIES:  Allergies  Allergen Reactions   Haloperidol Lactate Other (See Comments)    "Made him climb the walls."   Sildenafil Other (See Comments)    "pain in right thigh."      PERTINENT MEDICATIONS:  Outpatient Encounter Medications as of 06/19/2022  Medication Sig   aspirin EC 81 MG tablet Take 81 mg by mouth at bedtime.   atorvastatin (LIPITOR) 40 MG tablet Take 1 tablet (40 mg total) by mouth at bedtime.   buPROPion (WELLBUTRIN) 100 MG tablet Take 100 mg by mouth in the morning.   clonazePAM (KLONOPIN) 0.5 MG tablet Take 0.25 mg by mouth in the morning and at bedtime.   Emollient (CERAVE MOISTURIZING EX) Apply 1 application  topically See admin instructions. Apply to bilateral toes-to-calves daily   EX-LAX 15 MG CHEW Chew 15 mg by mouth daily as needed (for constipation).   fenofibrate 160 MG tablet Take 160 mg by mouth daily.   furosemide (LASIX) 20 MG tablet Take 20 mg by mouth See admin instructions. Take 20 mg by mouth in the morning on Mon/Tues/Wed/Fri   hydrocortisone cream 1 % Apply 1 application  topically daily as needed (for red patches on the body).   levETIRAcetam (KEPPRA) 500 MG tablet Take 1/2 tablet twice a day (Patient taking differently: Take 250 mg by mouth in the morning and at bedtime.)   linagliptin (TRADJENTA) 5 MG TABS tablet Take 5 mg by mouth every other day.   metFORMIN (GLUCOPHAGE) 500 MG tablet Take 500 mg by mouth in the morning and at bedtime.   metoprolol tartrate (LOPRESSOR) 25 MG tablet Take 25 mg by mouth daily.    Multiple Vitamins-Minerals (MULTIVITAMIN GUMMIES MENS) CHEW Chew 1 tablet by mouth daily.   NON FORMULARY Take 1 tablet by mouth See admin instructions. Calcium 500 mg + vitamin D-3 1,000 units tablets- Take 1 tablet by mouth once a day   OLANZapine (ZYPREXA) 10 MG tablet Take 20 mg by mouth at bedtime.   Omega-3  Fatty Acids (FISH OIL PO) Take 1,400 mg by mouth daily.   oxyCODONE (OXY IR/ROXICODONE) 5 MG immediate release tablet Take 1 tablet (5 mg total) by mouth every 6 (six) hours as needed for moderate pain or breakthrough pain.   Polyethyl Glycol-Propyl Glycol (SYSTANE FREE OP) Place 1 drop into both eyes 3 (three) times daily as needed (for dryness).   potassium chloride SA (KLOR-CON) 20 MEQ tablet Take 20 mEq by mouth See admin instructions. Take TWO HALVES of one 20 mEq tablet by mouth every morning to equal a total dose of 20 mEq   Probiotic Product (PROBIOTIC PO) Take 1 capsule by mouth in the morning.   sodium chloride flush 0.9 % SOLN injection 10 mLs by Intracatheter route as needed. Flush tube daily with 5 cc normal saline   TYLENOL 8 HOUR ARTHRITIS PAIN 650 MG CR tablet Take 1,300 mg by mouth in the morning and at bedtime.   No facility-administered encounter medications on file as of 06/19/2022.    Thank you for the opportunity to participate in the care of Robert Fisher.  The palliative care team will continue to follow. Please call our office at [phone] if we can be of additional assistance.   Hollace Kinnier, DO  COVID-19 PATIENT SCREENING TOOL Asked and negative response unless otherwise noted:  Have you had symptoms of covid, tested positive or been in contact with someone with symptoms/positive test in the  past 5-10 days? No

## 2022-06-20 ENCOUNTER — Ambulatory Visit (INDEPENDENT_AMBULATORY_CARE_PROVIDER_SITE_OTHER): Payer: Medicare Other | Admitting: Neurology

## 2022-06-20 ENCOUNTER — Encounter: Payer: Self-pay | Admitting: Neurology

## 2022-06-20 ENCOUNTER — Ambulatory Visit: Payer: Medicare Other | Admitting: Neurology

## 2022-06-20 VITALS — BP 114/74 | HR 74 | Ht 68.0 in | Wt 180.0 lb

## 2022-06-20 DIAGNOSIS — R251 Tremor, unspecified: Secondary | ICD-10-CM

## 2022-06-20 MED ORDER — LEVETIRACETAM 500 MG PO TABS
ORAL_TABLET | ORAL | 3 refills | Status: DC
Start: 2022-06-20 — End: 2023-02-02

## 2022-06-20 NOTE — Patient Instructions (Addendum)
Always good to see you.  Continue Keppra (Levetiracetam) 500mg : take 1/2 tablets twice a day  2. Wishing you all the best! Continue 24/7 care. Follow-up in 6 months, call for any changes

## 2022-06-20 NOTE — Progress Notes (Signed)
NEUROLOGY FOLLOW UP OFFICE NOTE  Robert Fisher [id number] 07/19/1923  HISTORY OF PRESENT ILLNESS: I had the pleasure of seeing Robert Fisher in follow-up in the neurology clinic on 06/20/2022.  The patient was last seen 6 months ago. He is again accompanied by his daughter Robert Fisher who helps supplement the history today. Records and images were personally reviewed where available.  Since his last visit, he has been to the hospital several times, most recently 6/17-6/27 for acute cholecystitis and bipedal edema. During his admission, he had head CT with no acute changes seen, there was diffuse atrophy and chronic microvascular disease. He still has his cholecystostomy drain. Robert Fisher reports 2 months ago, he had a cold that turned into pneumonia, then had a fall requiring 2 scalp staples. He continues to do physical therapy. His coumadin was recently restarted. His legs are still swollen, he enjoys his motorized wheelchair at home. He has not had any seizures or shaking episodes since 08/2021. He is on low dose Levetiracetam 500mg  1/2 tabs BID (250mg  BID). Robert Fisher reports his psychiatrist has also reduced his Bupropion dose to once a day. He is eating really great. He has not had any falls in the past 2-3 weeks. He was having more falls when unassisted, he has the right coverage for supervision now. He did not sleep well last night, he is drowsy in the office today but easily arousable.     History on Initial Assessment 08/12/2021: This is a pleasant 88 year old right-handed man with a history of hypertension, COPD, lung cancer, schizophrenia, presenting for evaluation of seizure. Robert Fisher reports that he had a seizure 20 years ago from "too much Tegretol." They were at a restaurant and he had full body shaking. His schizophrenia has been stable on medication for 25 years now. No further seizures until 07/28/21, when [facility] witnessed a 3-second episode of generalized shaking followed by falling asleep. Robert Fisher recalls  they were watching Robert Fisher when she saw his whole body shaking and eyes going in strange directions. She tapped him on the shoulder, his head was turned to one side (she cannot recall which side), this lasted 5 seconds then he told his daughter he was simply falling asleep and refused to go to the ER until 2 days later. She had checked his BP an hour later and it was normal. He had been otherwise fine the whole day and missing his wife who has been in rehab. In the ER, he reported having a "feeling in his head" 4-6 months prior where he has twitching in his head. CBC, BMP were normal. 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 They were unaware of any stroke symptoms in the past. He was discharged home on Levetiracetam 500mg  BID. He was back in the ER on 08/08/21 due to generalized weakness, he could hardly stand up.  Repeat bloodwork showed slightly worsened creatinine from 1.25 to 1.32. Levetiracetam was reduced to 250mg  BID, with no further weakness since then. He uses a walker at home. Robert Fisher denies any episodes of staring/unresponsiveness, he denies any olfactory/gustatory hallucinations, deja vu, rising epigastric sensation, focal numbness/tingling/weakness. He manages his own medications and finances without issues, denies missing clonazepam dose. He accidentally wrote a check recently, overdrawn by accident due to all his donations. He does not drive. He had a normal birth and  early development.  There is no history of febrile convulsions, CNS infections such as meningitis/encephalitis, significant traumatic brain injury, neurosurgical procedures, or family history of seizures.   PAST MEDICAL HISTORY: Past Medical History:  Diagnosis Date   Borderline diabetes    takes Metformin   Cancer ([place])    lung 2020 per  spouse LUL   COPD (chronic obstructive pulmonary disease) ([place])    Diabetes mellitus without complication ([place])    DVT (deep venous thrombosis) ([place])    Full dentures    Hearing aid worn    B/L   HOH (hard of hearing)    Hypertension    Left upper lobe pulmonary nodule    Memory problem    Pneumonia    Schizo affective schizophrenia ([place])    Seizure ([place]) 05/21/2022   Seizures ([place])     MEDICATIONS: Current Outpatient Medications on File Prior to Visit  Medication Sig Dispense Refill   aspirin EC 81 MG tablet Take 81 mg by mouth at bedtime.     atorvastatin (LIPITOR) 40 MG tablet Take 1 tablet (40 mg total) by mouth at bedtime.  2   buPROPion (WELLBUTRIN) 100 MG tablet Take 100 mg by mouth in the morning.     clonazePAM (KLONOPIN) 0.5 MG tablet Take 0.25 mg by mouth in the morning and at bedtime.     Emollient (CERAVE MOISTURIZING EX) Apply 1 application  topically See admin instructions. Apply to bilateral toes-to-calves daily     EX-LAX 15 MG CHEW Chew 15 mg by mouth daily as needed (for constipation).     fenofibrate 160 MG tablet Take 160 mg by mouth daily.     furosemide (LASIX) 20 MG tablet Take 20 mg by mouth See admin instructions. Take 20 mg by mouth in the morning on Mon/Tues/Wed/Fri     hydrocortisone cream 1 % Apply 1 application  topically daily as needed (for red patches on the body).     levETIRAcetam (KEPPRA) 500 MG tablet Take 1/2 tablet twice a day (Patient taking differently: Take 250 mg by mouth in the morning and at bedtime.) 90 tablet 3   linagliptin (TRADJENTA) 5 MG TABS tablet Take 5 mg by mouth every other day.     metFORMIN (GLUCOPHAGE) 500 MG tablet Take 500 mg by mouth in the morning and at bedtime.     metoprolol tartrate (LOPRESSOR) 25 MG tablet Take 25 mg by mouth daily.      Multiple Vitamins-Minerals (MULTIVITAMIN GUMMIES MENS) CHEW Chew 1 tablet by mouth daily.     NON FORMULARY Take 1 tablet by mouth See admin instructions. Calcium 500 mg + vitamin  D-3 1,000 units tablets- Take 1 tablet by mouth once a day     OLANZapine (ZYPREXA) 10 MG tablet Take 20 mg by mouth at bedtime.     Omega-3 Fatty Acids (FISH OIL PO) Take 1,400 mg by mouth daily.     oxyCODONE (OXY IR/ROXICODONE) 5 MG immediate release tablet Take 1 tablet (5 mg total) by mouth every 6 (six) hours as needed for moderate pain or breakthrough pain. 12 tablet 0   Polyethyl Glycol-Propyl Glycol (SYSTANE FREE OP) Place 1 drop into both eyes 3 (three) times daily as needed (for dryness).     potassium chloride SA (KLOR-CON) 20 MEQ tablet Take 20 mEq by mouth See admin instructions. Take TWO HALVES of one 20 mEq tablet by mouth every morning to equal a total dose of 20 mEq     Probiotic Product (PROBIOTIC  PO) Take 1 capsule by mouth in the morning.     sodium chloride flush 0.9 % SOLN injection 10 mLs by Intracatheter route as needed. Flush tube daily with 5 cc normal saline 300 mL 2   TYLENOL 8 HOUR ARTHRITIS PAIN 650 MG CR tablet Take 1,300 mg by mouth in the morning and at bedtime.     No current facility-administered medications on file prior to visit.    ALLERGIES: Allergies  Allergen Reactions   Haloperidol Lactate Other (See Comments)    "Made him climb the walls."   Sildenafil Other (See Comments)    "pain in right thigh."    FAMILY HISTORY: History reviewed. No pertinent family history.  SOCIAL HISTORY: Social History   Socioeconomic History   Marital status: Married    Spouse name: Not on file   Number of children: Not on file   Years of education: Not on file   Highest education level: Not on file  Occupational History   Not on file  Tobacco Use   Smoking status: Former    Packs/day: 1.00    Years: 50.00    Total pack years: 50.00    Types: Cigarettes, Pipe    Start date: 33    Quit date: 03/03/2015    Years since quitting: 7.3   Smokeless tobacco: Never   Tobacco comments:    30 yearsv cigarettes ; pipe 20 years  Vaping Use   Vaping Use: Never  used  Substance and Sexual Activity   Alcohol use: No   Drug use: No   Sexual activity: Not Currently  Other Topics Concern   Not on file  Social History Narrative   Right handed    Lives with wife daughter is with him right now has cameras to watch in a one story home   Caffeine 1 cup a day   Social Determinants of Health   Financial Resource Strain: Not on file  Food Insecurity: Not on file  Transportation Needs: Not on file  Physical Activity: Not on file  Stress: Not on file  Social Connections: Not on file  Intimate Partner Violence: Not on file     PHYSICAL EXAM: Vitals:   06/20/22 0935  BP: 114/74  Pulse: 74  SpO2: 97%   General: No acute distress, sitting on wheelchair Head:  Normocephalic/atraumatic Skin/Extremities: No rash, + bipedal edema Neurological Exam: drowsy, easily arousable to follow commands and answer questions with moderate dysarthria.  Cranial nerves: Pupils equal, round. Extraocular movements intact with no nystagmus. Visual fields full.  No facial asymmetry.  Motor: Bulk and tone normal, muscle strength 5/5 throughout with no pronator drift.   Finger to nose testing intact.  Gait not tested.    IMPRESSION: This is a pleasant 88 yo RH man with a history of hypertension, COPD, lung cancer, schizophrenia, with episodes of shaking. MRI without contrast in 08/2021 no acute changes, there was moderate diffuse atrophy and mild chronic microvascular disease. His 1-hour wake and sleep EEG in 08/2021 was normal. He has had several medical issues recently but no recurrence of shaking spells. We agreed to continue on low dose Levetiracetam 250mg  BID for now, but may consider weaning this off on his next visit. Continue follow-up with Psychiatry. Continue 24/7 supervision. Follow-up in 6 months, call for any changes.    Thank you for allowing me to participate in his care.  Please do not hesitate to call for any questions or concerns.    Ellouise Newer,  M.D.  CC: Dr. Virgina Jock

## 2022-06-26 ENCOUNTER — Other Ambulatory Visit: Payer: Medicare Other | Admitting: *Deleted

## 2022-06-26 DIAGNOSIS — Z515 Encounter for palliative care: Secondary | ICD-10-CM

## 2022-06-26 NOTE — Progress Notes (Signed)
[facility] RN NOTE  PATIENT NAME: Robert Fisher DOB: June 26, 1934 MRN: [id number]  PRIMARY CARE PROVIDER: Shon Baton, MD  RESPONSIBLE PARTY: [name] (daughter) Acct ID - Guarantor Home Phone Work Phone Relationship Acct Type  [id number] JAISE, [id number]  Self P/F     [address], [place], [place] [id number]   RN visit completed with patient in his home do check his INR. Daughter [name] also present during visit.   Meredith contacted palliative care this morning requesting patient's INR be checked. She is having more difficulties getting patient out to appointments due to weakness and didn't feel that he could make it to his appointment in office to have this checked.  He remains ambulatory with walker but requires assistance with standing and ambulation and also tires very easily. PT with [facility] is coming today at 4pm for therapy to work with patient.  Received verbal orders to obtain INR from [facility], nurse from Dr. Keane Police office.   Upon arrival patient is sleeping in his recliner chair. He aroused with verbal and tactile stimulation, greeted me briefly and slept throughout the rest of visit, even during finger stick for INR. INR today is 2.7 so is within goal range of 2-3. Currently taking Coumadin 2 mg daily. Meredith contacted office while I was there and we both left a voicemail to give INR results. MD office to call [name] back with further instructions. Palliative care team will continue to follow.   Daryl Eastern, RN BSN

## 2022-07-08 ENCOUNTER — Ambulatory Visit (HOSPITAL_COMMUNITY)
Admission: RE | Admit: 2022-07-08 | Discharge: 2022-07-08 | Disposition: A | Discharge status: Home / Self Care | Payer: Medicare Other | Source: Ambulatory Visit | Attending: Radiology | Admitting: Radiology

## 2022-07-08 DIAGNOSIS — Z434 Encounter for attention to other artificial openings of digestive tract: Secondary | ICD-10-CM | POA: Diagnosis present

## 2022-07-08 DIAGNOSIS — K810 Acute cholecystitis: Secondary | ICD-10-CM | POA: Diagnosis not present

## 2022-07-08 HISTORY — PX: IR EXCHANGE BILIARY DRAIN: IMG6046

## 2022-07-08 MED ORDER — LIDOCAINE HCL 1 % IJ SOLN
INTRAMUSCULAR | Status: AC
  Filled 2022-07-08: qty 20

## 2022-07-08 MED ORDER — IOHEXOL 300 MG/ML  SOLN
100.0000 mL | Freq: Once | INTRAMUSCULAR | Status: AC | PRN
  Administered 2022-07-08: 15 mL

## 2022-07-08 NOTE — Progress Notes (Deleted)
Cardiology Clinic Note   Patient Name: Robert Fisher Date of Encounter: 07/08/2022  Primary Care Provider:  Shon Baton, MD Primary Cardiologist:  None  Patient Profile    Robert Fisher 88 year old male presents the clinic today for follow-up evaluation of his hypertension and peripheral vascular disease.  Past Medical History    Past Medical History:  Diagnosis Date   Borderline diabetes    takes Metformin   Cancer ([place])    lung 2020 per spouse LUL   COPD (chronic obstructive pulmonary disease) ([place])    Diabetes mellitus without complication ([place])    DVT (deep venous thrombosis) ([place])    Full dentures    Hearing aid worn    B/L   HOH (hard of hearing)    Hypertension    Left upper lobe pulmonary nodule    Memory problem    Pneumonia    Schizo affective schizophrenia ([place])    Seizure ([place]) 05/21/2022   Seizures ([place])    Past Surgical History:  Procedure Laterality Date   cancer removal     legs   CATARACT EXTRACTION     COLONOSCOPY     FUDUCIAL PLACEMENT N/A 03/09/2019   Procedure: PLACEMENT OF FUDUCIAL;  Surgeon: [name], MD;  Location: [facility];  Service: Thoracic;  Laterality: N/A;   IR PERC CHOLECYSTOSTOMY  05/23/2022   THROMBECTOMY     VIDEO BRONCHOSCOPY WITH ENDOBRONCHIAL NAVIGATION N/A 03/09/2019   Procedure: VIDEO BRONCHOSCOPY WITH ENDOBRONCHIAL NAVIGATION;  Surgeon: [name], MD;  Location: [facility];  Service: Thoracic;  Laterality: N/A;    Allergies  Allergies  Allergen Reactions   Haloperidol Lactate Other (See Comments)    "Made him climb the walls."   Sildenafil Other (See Comments)    "pain in right thigh."    History of Present Illness    Robert Fisher has a PMH of PVD, HTN, malignant neoplasm left upper lobe, acute cholecystitis, diabetes type 2, CKD stage II, hyperlipidemia, seizure, constipation, bilateral lower extremity edema, LFT elevation, and history of pulmonary embolus.  Patient is on palliative  care.  He presented to the hospital on 05/20/2022 and was discharged on 05/27/2022.  You presented to the emergency department with complaints of worsening bilateral extremity swelling.  He reported that he had taken his diuretic.  He was also noted to have progressive global weakness and a fall from his wheelchair that morning.  He reported intermittent right upper quadrant abdominal pain.  He denied nausea and vomiting.  He underwent CT scan which showed acute cholecystitis with calculi.  He was noted to have elevated liver enzymes.  His total bilirubin was normal.  There was no dilation of his common bile duct.  He had a supratherapeutic INR at 5.5.  He was started on IV antibiotics, and Zosyn.  He was admitted to [facility] hospitalist service.  He improved with IV antibiotics.  General surgery was consulted and recommended cholecystectomy tube placement.  However placement was unable to be done due to supratherapeutic INR.  He was noted to have increased transaminitis and general surgery recommended percutaneous cholecystectomy which was done by IR on 05/23/2022 after reversal of his INR.  He was placed on soft diet and tolerated it well.  He was not noted to have respiratory symptoms despite his lower extremity edema.  He received furosemide 20 mg 4 times per day as well as HCTZ.  His echocardiogram showed normal LV function, G2 DD, normal pulmonary artery systolic pressure.  Initially his diuretics were held.  Compression stockings were recommended as well as lower extremity elevation.  He presents to the clinic today for follow-up evaluation states***  *** denies chest pain, shortness of breath, lower extremity edema, fatigue, palpitations, melena, hematuria, hemoptysis, diaphoresis, weakness, presyncope, syncope, orthopnea, and PND.  Chronic diastolic CHF-weight stable.  NYHA class***.  +1 pitting bilateral lower extremity edema. Continue furosemide, metoprolol, potassium Heart healthy low-sodium  diet-salty 6 given Increase physical activity as tolerated Continue lower extremity compression stockings Elevate lower extremities when not active Order BMP  Essential hypertension-BP today*** Continue metoprolol Heart healthy low-sodium diet Increase physical activity as tolerated  Hyperlipidemia-LDL*** Continue atorvastatin, aspirin, fenofibrate, omega-3 fatty acids Heart healthy low-sodium high-fiber diet  History of pulmonary embolus-compliant with Coumadin.  Palliative care has been monitoring INR. Continue Coumadin   Disposition:Follow-up with Dr.Berry in 3 months.  Home Medications    Prior to Admission medications   Medication Sig Start Date End Date Taking? Authorizing Provider  aspirin EC 81 MG tablet Take 81 mg by mouth at bedtime.    [provider]  atorvastatin (LIPITOR) 40 MG tablet Take 1 tablet (40 mg total) by mouth at bedtime. 06/03/22   Eugenie Filler, MD  buPROPion (WELLBUTRIN) 100 MG tablet Take 100 mg by mouth in the morning.    [provider]  clonazePAM (KLONOPIN) 0.5 MG tablet Take 0.25 mg by mouth in the morning and at bedtime.    [provider]  Emollient (CERAVE MOISTURIZING EX) Apply 1 application  topically See admin instructions. Apply to bilateral toes-to-calves daily    [provider]  EX-LAX 15 MG CHEW Chew 15 mg by mouth daily as needed (for constipation).    [provider]  fenofibrate 160 MG tablet Take 160 mg by mouth daily.    [provider]  furosemide (LASIX) 20 MG tablet Take 20 mg by mouth See admin instructions. Take 20 mg by mouth in the morning on Mon/Tues/Wed/Fri    [provider]  hydrocortisone cream 1 % Apply 1 application  topically daily as needed (for red patches on the body).    [provider]  levETIRAcetam (KEPPRA) 500 MG tablet Take 1/2 tablet twice a day 06/20/22   Cameron Sprang, MD  linagliptin (TRADJENTA) 5 MG TABS tablet Take 5 mg by mouth  every other day.    [provider]  lodoxamide (ALOMIDE) 0.1 % ophthalmic solution 1 drop 4 (four) times daily.    [provider]  metFORMIN (GLUCOPHAGE) 500 MG tablet Take 500 mg by mouth in the morning and at bedtime.    [provider]  metoprolol tartrate (LOPRESSOR) 25 MG tablet Take 25 mg by mouth daily.     [provider]  Multiple Vitamins-Minerals (MULTIVITAMIN GUMMIES MENS) CHEW Chew 1 tablet by mouth daily.    [provider]  NON FORMULARY Take 1 tablet by mouth See admin instructions. Calcium 500 mg + vitamin D-3 1,000 units tablets- Take 1 tablet by mouth once a day    [provider]  OLANZapine (ZYPREXA) 10 MG tablet Take 20 mg by mouth at bedtime.    [provider]  Omega-3 Fatty Acids (FISH OIL PO) Take 1,400 mg by mouth daily.    [provider]  oxyCODONE (OXY IR/ROXICODONE) 5 MG immediate release tablet Take 1 tablet (5 mg total) by mouth every 6 (six) hours as needed for moderate pain or breakthrough pain. 05/27/22   Eugenie Filler, MD  Polyethyl Glycol-Propyl Glycol (SYSTANE FREE OP) Place 1 drop into both eyes 3 (three) times daily as needed (for dryness).    [provider]  potassium chloride SA (KLOR-CON) 20 MEQ tablet Take 20 mEq by mouth See admin instructions. Take TWO HALVES of one 20 mEq tablet by mouth every morning to equal a total dose of 20 mEq    [provider]  Probiotic Product (PROBIOTIC PO) Take 1 capsule by mouth in the morning.    [provider]  sodium chloride flush 0.9 % SOLN injection 10 mLs by Intracatheter route as needed. Flush tube daily with 5 cc normal saline 06/10/22 07/11/22  Tyson Alias, NP  TYLENOL 8 HOUR ARTHRITIS PAIN 650 MG CR tablet Take 1,300 mg by mouth in the morning and at bedtime.    [provider]  warfarin (COUMADIN) 2 MG tablet Take 2 mg by mouth daily.    [provider]    Family History    No family  history on file. He indicated that his mother is deceased. He indicated that his father is deceased.  Social History    Social History   Socioeconomic History   Marital status: Married    Spouse name: Not on file   Number of children: Not on file   Years of education: Not on file   Highest education level: Not on file  Occupational History   Not on file  Tobacco Use   Smoking status: Former    Packs/day: 1.00    Years: 50.00    Total pack years: 50.00    Types: Cigarettes, Pipe    Start date: 76    Quit date: 03/03/2015    Years since quitting: 7.3   Smokeless tobacco: Never   Tobacco comments:    30 yearsv cigarettes ; pipe 20 years  Vaping Use   Vaping Use: Never used  Substance and Sexual Activity   Alcohol use: No   Drug use: No   Sexual activity: Not Currently  Other Topics Concern   Not on file  Social History Narrative   Right handed    Lives with wife daughter is with him right now has cameras to watch in a one story home   Caffeine 1 cup a day   Social Determinants of Health   Financial Resource Strain: Not on file  Food Insecurity: Not on file  Transportation Needs: Not on file  Physical Activity: Not on file  Stress: Not on file  Social Connections: Not on file  Intimate Partner Violence: Not on file     Review of Systems    General:  No chills, fever, night sweats or weight changes.  Cardiovascular:  No chest pain, dyspnea on exertion, edema, orthopnea, palpitations, paroxysmal nocturnal dyspnea. Dermatological: No rash, lesions/masses Respiratory: No cough, dyspnea Urologic: No hematuria, dysuria Abdominal:   No nausea, vomiting, diarrhea, bright red blood per rectum, melena, or hematemesis Neurologic:  No visual changes, wkns, changes in mental status. All other systems reviewed and are otherwise negative except as noted above.  Physical Exam    VS:  There were no vitals taken for this visit. , BMI There is no height or weight on file to  calculate BMI. GEN: Well nourished, well developed, in no acute distress. HEENT: normal. Neck: Supple, no JVD, carotid bruits, or masses. Cardiac: RRR, no murmurs, rubs, or gallops. No clubbing, cyanosis, edema.  Radials/DP/PT 2+ and equal bilaterally.  Respiratory:  Respirations regular and unlabored, clear to  auscultation bilaterally. GI: Soft, nontender, nondistended, BS + x 4. MS: no deformity or atrophy. Skin: warm and dry, no rash. Neuro:  Strength and sensation are intact. Psych: Normal affect.  Accessory Clinical Findings    Recent Labs: 05/19/2022: B Natriuretic Peptide 134.3 05/26/2022: Hemoglobin 14.8; Magnesium 2.2; Platelets 365 05/27/2022: ALT 72; BUN 8; Creatinine, Ser 0.94; Potassium 3.8; Sodium 137   Recent Lipid Panel    Component Value Date/Time   CHOL 143 10/21/2007 0906   TRIG 112 10/21/2007 0906   TRIG 145 12/14/2006 1005   HDL 43.9 10/21/2007 0906   CHOLHDL 3.3 CALC 10/21/2007 0906   VLDL 22 10/21/2007 0906   LDLCALC 77 10/21/2007 0906   LDLDIRECT 159.4 06/03/2007 0927    ECG personally reviewed by me today- *** - No acute changes  Echocardiogram 04/17/2022 IMPRESSIONS     1. Moderate septal hypertrophy with otherwise mild concentric LVH. Left  ventricular ejection fraction, by estimation, is 65 to 70%. The left  ventricle has normal function. The left ventricle has no regional wall  motion abnormalities. There is moderate  asymmetric left ventricular hypertrophy. Left ventricular diastolic  parameters are consistent with Grade II diastolic dysfunction  (pseudonormalization). Elevated left ventricular end-diastolic pressure.   2. Right ventricular systolic function is normal. The right ventricular  size is normal. There is normal pulmonary artery systolic pressure.   3. Left atrial size was mildly dilated.   4. The mitral valve is normal in structure. No evidence of mitral valve  regurgitation. No evidence of mitral stenosis.   5. The aortic valve  is tricuspid. There is mild calcification of the  aortic valve. There is mild thickening of the aortic valve. Aortic valve  regurgitation is trivial. Aortic valve sclerosis is present, with no  evidence of aortic valve stenosis.   6. Aortic dilatation noted. There is borderline dilatation of the aortic  root, measuring 36 mm.   7. The inferior vena cava is normal in size with greater than 50%  respiratory variability, suggesting right atrial pressure of 3 mmHg.  Assessment & Plan   1.  ***   Jossie Ng. Kandi Brusseau NP-C     07/08/2022, 1:01 PM [facility] [address] Office [phone] Fax [phone]  Notice: This dictation was prepared with Dragon dictation along with smaller phrase technology. Any transcriptional errors that result from this process are unintentional and may not be corrected upon review.  I spent***minutes examining this patient, reviewing medications, and using patient centered shared decision making involving her cardiac care.  Prior to her visit I spent greater than 20 minutes reviewing her past medical history,  medications, and prior cardiac tests.

## 2022-07-09 ENCOUNTER — Ambulatory Visit: Payer: Medicare Other | Admitting: Podiatry

## 2022-07-09 ENCOUNTER — Other Ambulatory Visit (HOSPITAL_COMMUNITY): Payer: Self-pay | Admitting: Radiology

## 2022-07-09 ENCOUNTER — Telehealth: Payer: Self-pay | Admitting: Physician Assistant

## 2022-07-09 ENCOUNTER — Encounter: Payer: Self-pay | Admitting: Podiatry

## 2022-07-09 DIAGNOSIS — M79674 Pain in right toe(s): Secondary | ICD-10-CM

## 2022-07-09 DIAGNOSIS — I739 Peripheral vascular disease, unspecified: Secondary | ICD-10-CM

## 2022-07-09 DIAGNOSIS — K810 Acute cholecystitis: Secondary | ICD-10-CM

## 2022-07-09 DIAGNOSIS — B351 Tinea unguium: Secondary | ICD-10-CM | POA: Diagnosis not present

## 2022-07-09 DIAGNOSIS — M79675 Pain in left toe(s): Secondary | ICD-10-CM

## 2022-07-09 DIAGNOSIS — M2031 Hallux varus (acquired), right foot: Secondary | ICD-10-CM

## 2022-07-09 NOTE — Telephone Encounter (Signed)
Received message from patient's daughter today requesting callback to discuss drain care instructions/further plans for drain.  Attempted to call [name] x 3 today without answer, voicemail left requesting call back when convenient.   Drain is currently capped and does not need to be flushed routinely, if patient experience s/s (fever, chills, persistent n/v, abdominal pain, etc) drain should be replaced to gravity bag and IR should be contacted during business hours. Per general surgery note patient would be very high risk for cholecystectomy and is not recommended, patient and his family are not interested in pursuing surgical intervention.   Per Dr. Dwaine Gale current plan for drain is as follows: - Dr. Keane Police office to send official consult to [facility] for spyglass procedure (office contacted by Dr. Dwaine Gale today and they agree to send referral) - Once consult request received IR schedulers will call patient to setup a time to come to [facility] to have detailed discussion regarding potential spyglass procedure including the risks, benefits and alternatives (this can be done with Dr. Dwaine Gale, Dr. Serafina Royals or Dr. Maryelizabeth Kaufmann) - If spyglass procedure deemed appropriate and patient undergoes procedure it will likely take 2-3 months to clear stones enough to remove drain. Even with spyglass procedure the drain may never be able to be removed  Will attempt to contact patient's daughter tomorrow to discuss above.   Candiss Norse, PA-C

## 2022-07-09 NOTE — Progress Notes (Signed)
This patient returns to my office for at risk foot care.  This patient requires this care by a professional since this patient will be at risk due to having peripheral vascular disease and coagulation defect.  Patient is taking coumadin.  This patient is unable to cut nails himself since the patient cannot reach his nails.These nails are painful walking and wearing shoes.  This patient presents for at risk foot care today.  General Appearance  Alert, conversant and in no acute stress.  Vascular  Dorsalis pedis and posterior tibial  pulses are weakly  palpable  bilaterally.  Capillary return is within normal limits  bilaterally. Temperature is within normal limits  Bilaterally. Absent digital hair.  Neurologic  Senn-Weinstein monofilament wire test within normal limits  bilaterally. Muscle power within normal limits bilaterally.  Nails Thick disfigured discolored nails with subungual debris  from hallux to fifth toes bilaterally. No evidence of bacterial infection or drainage bilaterally.  Orthopedic  No limitations of motion  feet .  No crepitus or effusions noted.  No bony pathology or digital deformities noted.  Hallux malleus right foot.  Skin  normotropic skin with no porokeratosis noted bilaterally.  No signs of infections or ulcers noted.     Onychomycosis  Pain in right toes  Pain in left toes  Hallux malleus.  Consent was obtained for treatment procedures.   Mechanical debridement of nails 1-5  bilaterally performed with a nail nipper.  Filed with dremel without incident. Discussed hallux with this patient and padding was dispensed.   Return office visit   3 months                  Told patient to return for periodic foot care and evaluation due to potential at risk complications.   Gardiner Barefoot DPM

## 2022-07-10 ENCOUNTER — Other Ambulatory Visit: Payer: Self-pay | Admitting: Internal Medicine

## 2022-07-10 ENCOUNTER — Telehealth: Payer: Self-pay | Admitting: Cardiovascular Disease

## 2022-07-10 ENCOUNTER — Ambulatory Visit: Payer: Medicare Other | Admitting: General Practice

## 2022-07-10 DIAGNOSIS — K8011 Calculus of gallbladder with chronic cholecystitis with obstruction: Secondary | ICD-10-CM

## 2022-07-10 NOTE — Telephone Encounter (Signed)
Daughter reported patient had his gall bladder tested, it is working correctly but they could not take the tube out yet. Daughter stated he was not feeling well enough to make his appointment today.

## 2022-07-11 ENCOUNTER — Telehealth: Payer: Self-pay | Admitting: *Deleted

## 2022-07-11 ENCOUNTER — Ambulatory Visit
Admission: RE | Admit: 2022-07-11 | Discharge: 2022-07-11 | Disposition: A | Discharge status: Home / Self Care | Payer: Medicare Other | Source: Ambulatory Visit | Attending: Internal Medicine | Admitting: Internal Medicine

## 2022-07-11 DIAGNOSIS — K8011 Calculus of gallbladder with chronic cholecystitis with obstruction: Secondary | ICD-10-CM

## 2022-07-11 HISTORY — PX: IR RADIOLOGIST EVAL & MGMT: IMG5224

## 2022-07-11 NOTE — Telephone Encounter (Signed)
RETURNED PATIENT'S DAUGHTER'S PHONE CALL, LVM FOR A RETURN CALL

## 2022-07-11 NOTE — Consult Note (Signed)
Chief Complaint: Chronic indwelling cholecystostomy tube  Referring Physician(s): Russo,John  History of Present Illness: Robert Fisher is a 88 y.o. male with history of acute calculous cholecystitis in June 2023 requiring image guided percutaneous cholecystostomy tube placement (Dr. Maryelizabeth Kaufmann) on 05/23/22.  He underwent initial cholecystostomy tube check and change on 07/08/22 which was uneventful.  Cholecystogram showed persistent cholelithiasis without evidence of cholecystitis.  A capping trial was started.  He present today via virtual telephone clinic visit to discuss possible percutaneous gallstone retrieval and hopefully eventual tube removal.  History and discussion is primarily had with his daughter, Robert Fisher.  He has tolerated his capping trial well thus far without complaints of pain, fever, nausea/vomiting, or bilious leaking around tube.  He and his daughter express motivation to get rid of the cholecystostomy tube as it is bothersome to him.  He is on coumadin apparently for remote history of DVT.  He has no imaging available to me in our system demonstrating any DVT or PE.  Difficult to illicit whether prior DVT was provoked or unprovoked.  He apparently had suffered multiple falls prior to recent admission.  He gets INR drawn monthly.      Past Medical History:  Diagnosis Date   Borderline diabetes    takes Metformin   Cancer ([place])    lung 2020 per spouse LUL   COPD (chronic obstructive pulmonary disease) ([place])    Diabetes mellitus without complication ([place])    DVT (deep venous thrombosis) ([place])    Full dentures    Hearing aid worn    B/L   HOH (hard of hearing)    Hypertension    Left upper lobe pulmonary nodule    Memory problem    Pneumonia    Schizo affective schizophrenia ([place])    Seizure ([place]) 05/21/2022   Seizures ([place])     Past Surgical History:  Procedure Laterality Date   cancer removal     legs   CATARACT EXTRACTION     COLONOSCOPY      FUDUCIAL PLACEMENT N/A 03/09/2019   Procedure: PLACEMENT OF FUDUCIAL;  Surgeon: [name], MD;  Location: [place];  Service: Thoracic;  Laterality: N/A;   IR EXCHANGE BILIARY DRAIN  07/08/2022   IR PERC CHOLECYSTOSTOMY  05/23/2022   THROMBECTOMY     VIDEO BRONCHOSCOPY WITH ENDOBRONCHIAL NAVIGATION N/A 03/09/2019   Procedure: VIDEO BRONCHOSCOPY WITH ENDOBRONCHIAL NAVIGATION;  Surgeon: [name], MD;  Location: [facility] OR;  Service: Thoracic;  Laterality: N/A;    Allergies: Haloperidol lactate and Sildenafil  Medications: Prior to Admission medications   Medication Sig Start Date End Date Taking? Authorizing Provider  aspirin EC 81 MG tablet Take 81 mg by mouth at bedtime.    [provider]  atorvastatin (LIPITOR) 40 MG tablet Take 1 tablet (40 mg total) by mouth at bedtime. 06/03/22   Eugenie Filler, MD  buPROPion (WELLBUTRIN) 100 MG tablet Take 100 mg by mouth in the morning.    [provider]  clonazePAM (KLONOPIN) 0.5 MG tablet Take 0.25 mg by mouth in the morning and at bedtime.    [provider]  Emollient (CERAVE MOISTURIZING EX) Apply 1 application  topically See admin instructions. Apply to bilateral toes-to-calves daily    [provider]  EX-LAX 15 MG CHEW Chew 15 mg by mouth daily as needed (for constipation).    [provider]  fenofibrate 160 MG tablet Take 160 mg by mouth daily.    [provider]  furosemide (LASIX) 20 MG tablet Take 20 mg by mouth See admin instructions. Take 20 mg by mouth in the morning on Mon/Tues/Wed/Fri    [provider]  hydrocortisone cream 1 % Apply 1 application  topically daily as needed (for red patches on the body).    [provider]  levETIRAcetam (KEPPRA) 500 MG tablet Take 1/2 tablet twice a day 06/20/22   Cameron Sprang, MD  linagliptin (TRADJENTA) 5 MG TABS tablet Take 5 mg by mouth every other day.    [provider]  lodoxamide  (ALOMIDE) 0.1 % ophthalmic solution 1 drop 4 (four) times daily.    [provider]  metFORMIN (GLUCOPHAGE) 500 MG tablet Take 500 mg by mouth in the morning and at bedtime.    [provider]  metoprolol tartrate (LOPRESSOR) 25 MG tablet Take 25 mg by mouth daily.     [provider]  Multiple Vitamins-Minerals (MULTIVITAMIN GUMMIES MENS) CHEW Chew 1 tablet by mouth daily.    [provider]  NON FORMULARY Take 1 tablet by mouth See admin instructions. Calcium 500 mg + vitamin D-3 1,000 units tablets- Take 1 tablet by mouth once a day    [provider]  OLANZapine (ZYPREXA) 10 MG tablet Take 20 mg by mouth at bedtime.    [provider]  Omega-3 Fatty Acids (FISH OIL PO) Take 1,400 mg by mouth daily.    [provider]  oxyCODONE (OXY IR/ROXICODONE) 5 MG immediate release tablet Take 1 tablet (5 mg total) by mouth every 6 (six) hours as needed for moderate pain or breakthrough pain. 05/27/22   Eugenie Filler, MD  Polyethyl Glycol-Propyl Glycol (SYSTANE FREE OP) Place 1 drop into both eyes 3 (three) times daily as needed (for dryness).    [provider]  potassium chloride SA (KLOR-CON) 20 MEQ tablet Take 20 mEq by mouth See admin instructions. Take TWO HALVES of one 20 mEq tablet by mouth every morning to equal a total dose of 20 mEq    [provider]  Probiotic Product (PROBIOTIC PO) Take 1 capsule by mouth in the morning.    [provider]  sodium chloride flush 0.9 % SOLN injection 10 mLs by Intracatheter route as needed. Flush tube daily with 5 cc normal saline 06/10/22 07/11/22  Tyson Alias, NP  TYLENOL 8 HOUR ARTHRITIS PAIN 650 MG CR tablet Take 1,300 mg by mouth in the morning and at bedtime.    [provider]  warfarin (COUMADIN) 2 MG tablet Take 2 mg by mouth daily.    [provider]     No family history on file.  Social History   Socioeconomic History   Marital  status: Married    Spouse name: Not on file   Number of children: Not on file   Years of education: Not on file   Highest education level: Not on file  Occupational History   Not on file  Tobacco Use   Smoking status: Former    Packs/day: 1.00    Years: 50.00    Total pack years: 50.00    Types: Cigarettes, Pipe    Start date: 19    Quit date: 03/03/2015    Years since quitting: 7.3   Smokeless tobacco: Never   Tobacco comments:    30 yearsv cigarettes ; pipe 20 years  Vaping Use   Vaping Use: Never used  Substance and Sexual Activity   Alcohol use: No  Drug use: No   Sexual activity: Not Currently  Other Topics Concern   Not on file  Social History Narrative   Right handed    Lives with wife daughter is with him right now has cameras to watch in a one story home   Caffeine 1 cup a day   Social Determinants of Health   Financial Resource Strain: Not on file  Food Insecurity: Not on file  Transportation Needs: Not on file  Physical Activity: Not on file  Stress: Not on file  Social Connections: Not on file    Review of Systems: A 12 point ROS discussed and pertinent positives are indicated in the HPI above.  All other systems are negative.   Vital Signs: There were no vitals taken for this visit.  Advance Care Plan: The advanced care plan/surrogate decision maker was discussed at the time of visit and documented in the medical record.   No physical examination was performed in lieu of virtual telephone clinic visit.   Imaging: Cholecystogram 07/08/22  Cholelithiasis, well positioned drain.  No choledocholithiasis.   Labs:  CBC: Recent Labs    05/23/22 0120 05/24/22 0134 05/25/22 0118 05/26/22 0149  WBC 8.1 6.5 6.3 7.9  HGB 14.1 13.2 13.6 14.8  HCT 45.4 42.3 43.3 47.0  PLT 260 298 343 365    COAGS: Recent Labs    05/24/22 0134 05/25/22 0118 05/26/22 0149 05/27/22 0118  INR 1.4* 1.3* 1.3* 1.3*    BMP: Recent Labs    05/24/22 0134  05/25/22 0118 05/26/22 0149 05/27/22 0118  NA 138 140 138 137  K 3.9 3.4* 4.0 3.8  CL 105 107 105 107  CO2 23 26 26 25   GLUCOSE 187* 129* 177* 169*  BUN 9 9 8 8   CALCIUM 8.7* 9.1 9.1 8.8*  CREATININE 1.09 1.10 1.11 0.94  GFRNONAA >60 >60 >60 >60    LIVER FUNCTION TESTS: Recent Labs    05/24/22 0134 05/25/22 0118 05/26/22 0149 05/27/22 0118  BILITOT 0.7 0.5 0.5 0.6  AST 116* 78* 56* 51*  ALT 152* 113* 92* 72*  ALKPHOS 70 65 66 60  PROT 5.5* 5.8* 6.2* 5.5*  ALBUMIN 2.3* 2.4* 2.6* 2.4*    TUMOR MARKERS: No results for input(s): "AFPTM", "CEA", "CA199", "CHROMGRNA" in the last 8760 hours.  Assessment and Plan: 88 year old male with history of acute calculous cholecystitis status post percutaneous cholecystostomy tube placement on 05/23/22.  He is clearly a poor surgical candidate, and motivated to get rid of indwelling cholecystostomy.  I discussed indications, risks, benefits, and overall timeline of percutaneous gallstone retrieval using choledochoscopic (Spyglass) guidance in addition to flushing, snaring, lithotripsy, or a combination thereof to rid the gallbladder of stones.  We discussed that this is a multistep process that requires at least 1 month from the procedure day until the tube could be safely removed after a 2 week capping trial.  The patient and his daughter are interested in pursuing this route.  Mr. Avakian would need to be off warfarin for 1 week prior to the procedure and aspirin for 3-5 days.  I have reached out to Dr. Virgina Jock to discuss cessation of coumadin indefinitely.    Plan for fluoroscopic and choledochoscopic guided percutaneous gallstone retrieval and cholecystostomy tube exchange with moderate sedation at [facility].       Electronically Signed: Suzette Battiest, MD 07/11/2022, 2:54 PM   I spent a total of  60 Minutes  in virtual telephone clinical consultation, greater than 50%  of which was counseling/coordinating care for cholelithiasis.

## 2022-07-14 ENCOUNTER — Ambulatory Visit: Payer: Medicare Other | Admitting: Physician Assistant

## 2022-07-14 ENCOUNTER — Other Ambulatory Visit: Payer: Self-pay | Admitting: Internal Medicine

## 2022-07-14 ENCOUNTER — Other Ambulatory Visit: Payer: Self-pay | Admitting: Physician Assistant

## 2022-07-14 ENCOUNTER — Encounter: Payer: Self-pay | Admitting: Physician Assistant

## 2022-07-14 VITALS — BP 129/74 | HR 75 | Ht 68.0 in | Wt 180.0 lb

## 2022-07-14 DIAGNOSIS — I825Y9 Chronic embolism and thrombosis of unspecified deep veins of unspecified proximal lower extremity: Secondary | ICD-10-CM

## 2022-07-14 DIAGNOSIS — E785 Hyperlipidemia, unspecified: Secondary | ICD-10-CM

## 2022-07-14 DIAGNOSIS — E119 Type 2 diabetes mellitus without complications: Secondary | ICD-10-CM

## 2022-07-14 DIAGNOSIS — I10 Essential (primary) hypertension: Secondary | ICD-10-CM | POA: Diagnosis not present

## 2022-07-14 DIAGNOSIS — R600 Localized edema: Secondary | ICD-10-CM

## 2022-07-14 NOTE — Patient Instructions (Addendum)
Medication Instructions:  Your physician recommends that you continue on your current medications as directed. Please refer to the Current Medication list given to you today.  *If you need a refill on your cardiac medications before your next appointment, please call your pharmacy*  Lab Work: NONE ordered at this time of appointment   If you have labs (blood work) drawn today and your tests are completely normal, you will receive your results only by: [place] (if you have MyChart) OR A paper copy in the mail If you have any lab test that is abnormal or we need to change your treatment, we will call you to review the results.   Testing/Procedures: NONE ordered at this time of appointment   Follow-Up: At [facility], you and your health needs are our priority.  As part of our continuing mission to provide you with exceptional heart care, we have created designated Provider Care Teams.  These Care Teams include your primary Cardiologist (physician) and Advanced Practice Providers (APPs -  Physician Assistants and Nurse Practitioners) who all work together to provide you with the care you need, when you need it.  Your next appointment:   As Needed    The format for your next appointment:   In Person  Provider:   Quay Burow, MD  or APP    Other Instructions Continue with wearing compression stockings for leg swelling  Drink more protein shakes  If swelling of legs worsen and/or increase, may take extra dose of lasix along with extra dose of potassium for 1-2 days then resume previous daily dose. However if patient needs extra dose of Lasix for more than 2 times a week, then you should check with PCP about increasing of Lasix. "When increasing Lasix you will also need to increase Potassium"  Limit all fluids for the day to 32-64 fl oz. Fluid includes all drinks, coffee, juice, ice chips, soup, jello, and all other liquids.   Important Information About Sugar

## 2022-07-14 NOTE — Progress Notes (Unsigned)
Cardiology Office Note:    Date:  07/16/2022   ID:  Robert Fisher, DOB Apr 21, 1934, MRN [id number]  PCP:  Robert Fisher, Robert Fisher Providers Cardiologist:  Robert Burow, MD     Referring MD: Robert Baton, MD   Chief Complaint  Patient presents with   Follow-up    Seen for Dr. Gwenlyn Found    History of Present Illness:    Robert Fisher is a 88 y.o. male with a hx of hypertension, hyperlipidemia, DM2, paranoid schizophrenia, seizure disorder, former tobacco abuse, COPD, lung cancer, history of DVT/PE on warfarin.  Patient has never had a heart attack or stroke.  He is mostly wheelchair-bound but walks occasionally with a walker. Patient was recently seen in the ED earlier this year for congestive heart failure and was referred to cardiology service.  Echocardiogram obtained in May 2023 showed normal EF, grade 2 DD, normal pulm artery systolic pressure.  He was last seen by Dr. Gwenlyn Found on 04/19/2022 at which time he only had 1+ ankle edema.  He is on Lasix 4 times a week.  Since last visit, patient was seen in the ED on 04/03/2022 after a fall.  Patient was also admitted on 05/19/2022 with progressively worsening lower extremity edema despite taking home diuretic.  Patient also had intermittent right upper quadrant abdominal pain.  Work-up revealed acute cholecystitis seen on CT scan and HIDA scan, elevated liver enzyme, normal total bilirubin.  INR was supratherapeutic at 5.5.  Patient was started on empiric IV antibiotic.  He was seen by general surgery who recommended percutaneous cholecystostomy tube to be performed by IR.  Patient presents today for follow-up accompanied by daughter.  She says they quite exhausted with caring for the patient at this time.  Patient responded very little during the office visit.  On physical exam, patient has at least 2+ pitting edema.  According to family, he does not like to go to the bathroom at night and family keep changing diapers.  I would  like to keep the Lasix at 4 times a week if possible.  I suspect his leg edema is due to combination of diastolic dysfunction and more importantly low albumin level.  I recommended a protein shake.  Family and wish to reduce the number of doctors visits since the patient is currently on palliative care service.  Lower extremity edema can be managed by primary care provider.  Patient can see cardiology service on as-needed basis.  Past Medical History:  Diagnosis Date   Borderline diabetes    takes Metformin   Cancer ([place])    lung 2020 per spouse LUL   COPD (chronic obstructive pulmonary disease) ([place])    Diabetes mellitus without complication ([place])    DVT (deep venous thrombosis) ([place])    Full dentures    Hearing aid worn    B/L   HOH (hard of hearing)    Hypertension    Left upper lobe pulmonary nodule    Memory problem    Pneumonia    Schizo affective schizophrenia ([place])    Seizure ([place]) 05/21/2022   Seizures ([place])     Past Surgical History:  Procedure Laterality Date   cancer removal     legs   CATARACT EXTRACTION     COLONOSCOPY     FUDUCIAL PLACEMENT N/A 03/09/2019   Procedure: PLACEMENT OF FUDUCIAL;  Surgeon: [name], MD;  Location: [facility];  Service: Thoracic;  Laterality: N/A;   IR EXCHANGE  BILIARY DRAIN  07/08/2022   IR PERC CHOLECYSTOSTOMY  05/23/2022   THROMBECTOMY     VIDEO BRONCHOSCOPY WITH ENDOBRONCHIAL NAVIGATION N/A 03/09/2019   Procedure: VIDEO BRONCHOSCOPY WITH ENDOBRONCHIAL NAVIGATION;  Surgeon: [name], MD;  Location: [facility];  Service: Thoracic;  Laterality: N/A;    Current Medications: Current Meds  Medication Sig   aspirin EC 81 MG tablet Take 81 mg by mouth at bedtime.   atorvastatin (LIPITOR) 40 MG tablet Take 1 tablet (40 mg total) by mouth at bedtime.   buPROPion (WELLBUTRIN) 100 MG tablet Take 100 mg by mouth in the morning.   clonazePAM (KLONOPIN) 0.5 MG tablet Take 0.25 mg by mouth in the morning and at bedtime.    Emollient (CERAVE MOISTURIZING EX) Apply 1 application  topically See admin instructions. Apply to bilateral toes-to-calves daily   EX-LAX 15 MG CHEW Chew 15 mg by mouth daily as needed (for constipation).   fenofibrate 160 MG tablet Take 160 mg by mouth daily.   furosemide (LASIX) 20 MG tablet Take 20 mg by mouth See admin instructions. Take 20 mg by mouth in the morning on Mon/Tues/Wed/Fri   hydrocortisone cream 1 % Apply 1 application  topically daily as needed (for red patches on the body).   levETIRAcetam (KEPPRA) 500 MG tablet Take 1/2 tablet twice a day   linagliptin (TRADJENTA) 5 MG TABS tablet Take 5 mg by mouth every other day.   lodoxamide (ALOMIDE) 0.1 % ophthalmic solution 1 drop 4 (four) times daily.   metFORMIN (GLUCOPHAGE) 500 MG tablet Take 500 mg by mouth in the morning and at bedtime.   metoprolol tartrate (LOPRESSOR) 25 MG tablet Take 25 mg by mouth daily.    Multiple Vitamins-Minerals (MULTIVITAMIN GUMMIES MENS) CHEW Chew 1 tablet by mouth daily.   NON FORMULARY Take 1 tablet by mouth See admin instructions. Calcium 500 mg + vitamin D-3 1,000 units tablets- Take 1 tablet by mouth once a day   OLANZapine (ZYPREXA) 10 MG tablet Take 20 mg by mouth at bedtime.   Omega-3 Fatty Acids (FISH OIL PO) Take 1,400 mg by mouth daily.   oxyCODONE (OXY IR/ROXICODONE) 5 MG immediate release tablet Take 1 tablet (5 mg total) by mouth every 6 (six) hours as needed for moderate pain or breakthrough pain.   Polyethyl Glycol-Propyl Glycol (SYSTANE FREE OP) Place 1 drop into both eyes 3 (three) times daily as needed (for dryness).   potassium chloride SA (KLOR-CON) 20 MEQ tablet Take 20 mEq by mouth See admin instructions. Take TWO HALVES of one 20 mEq tablet by mouth every morning to equal a total dose of 20 mEq   Probiotic Product (PROBIOTIC PO) Take 1 capsule by mouth in the morning.   TYLENOL 8 HOUR ARTHRITIS PAIN 650 MG CR tablet Take 1,300 mg by mouth in the morning and at bedtime.    warfarin (COUMADIN) 2 MG tablet Take 2 mg by mouth daily.     Allergies:   Haloperidol lactate and Sildenafil   Social History   Socioeconomic History   Marital status: Married    Spouse name: Not on file   Number of children: Not on file   Years of education: Not on file   Highest education level: Not on file  Occupational History   Not on file  Tobacco Use   Smoking status: Former    Packs/day: 1.00    Years: 50.00    Total pack years: 50.00    Types: Cigarettes, Pipe  Start date: 60    Quit date: 03/03/2015    Years since quitting: 7.3   Smokeless tobacco: Never   Tobacco comments:    30 yearsv cigarettes ; pipe 20 years  Vaping Use   Vaping Use: Never used  Substance and Sexual Activity   Alcohol use: No   Drug use: No   Sexual activity: Not Currently  Other Topics Concern   Not on file  Social History Narrative   Right handed    Lives with wife daughter is with him right now has cameras to watch in a one story home   Caffeine 1 cup a day   Social Determinants of Health   Financial Resource Strain: Not on file  Food Insecurity: Not on file  Transportation Needs: Not on file  Physical Activity: Not on file  Stress: Not on file  Social Connections: Not on file     Family History: The patient's family history is not on file.  ROS:   Please see the history of present illness.     All other systems reviewed and are negative.  EKGs/Labs/Other Studies Reviewed:    The following studies were reviewed today:  Echo 04/17/2022 1. Moderate septal hypertrophy with otherwise mild concentric LVH. Left  ventricular ejection fraction, by estimation, is 65 to 70%. The left  ventricle has normal function. The left ventricle has no regional wall  motion abnormalities. There is moderate  asymmetric left ventricular hypertrophy. Left ventricular diastolic  parameters are consistent with Grade II diastolic dysfunction  (pseudonormalization). Elevated left ventricular  end-diastolic pressure.   2. Right ventricular systolic function is normal. The right ventricular  size is normal. There is normal pulmonary artery systolic pressure.   3. Left atrial size was mildly dilated.   4. The mitral valve is normal in structure. No evidence of mitral valve  regurgitation. No evidence of mitral stenosis.   5. The aortic valve is tricuspid. There is mild calcification of the  aortic valve. There is mild thickening of the aortic valve. Aortic valve  regurgitation is trivial. Aortic valve sclerosis is present, with no  evidence of aortic valve stenosis.   6. Aortic dilatation noted. There is borderline dilatation of the aortic  root, measuring 36 mm.   7. The inferior vena cava is normal in size with greater than 50%  respiratory variability, suggesting right atrial pressure of 3 mmHg.   EKG:  EKG is not ordered today.   Recent Labs: 05/19/2022: B Natriuretic Peptide 134.3 05/26/2022: Hemoglobin 14.8; Magnesium 2.2; Platelets 365 05/27/2022: ALT 72; BUN 8; Creatinine, Ser 0.94; Potassium 3.8; Sodium 137  Recent Lipid Panel    Component Value Date/Time   CHOL 143 10/21/2007 0906   TRIG 112 10/21/2007 0906   TRIG 145 12/14/2006 1005   HDL 43.9 10/21/2007 0906   CHOLHDL 3.3 CALC 10/21/2007 0906   VLDL 22 10/21/2007 0906   LDLCALC 77 10/21/2007 0906   LDLDIRECT 159.4 06/03/2007 0927     Risk Assessment/Calculations:           Physical Exam:    VS:  BP 129/74   Pulse 75   Ht 5\' 8"  (1.727 m)   Wt 180 lb (81.6 kg)   SpO2 95%   BMI 27.37 kg/m        Wt Readings from Last 3 Encounters:  07/14/22 180 lb (81.6 kg)  06/20/22 180 lb (81.6 kg)  05/27/22 192 lb 10.9 oz (87.4 kg)     GEN: Frail, weak, did  not respond during the interview HEENT: Normal NECK: No JVD; No carotid bruits LYMPHATICS: No lymphadenopathy CARDIAC: RRR, no murmurs, rubs, gallops RESPIRATORY:  Clear to auscultation without rales, wheezing or rhonchi  ABDOMEN: Soft, non-tender,  non-distended MUSCULOSKELETAL:  No edema; No deformity  SKIN: Warm and dry NEUROLOGIC: Unable to assess PSYCHIATRIC: Unable to assess  ASSESSMENT:    1. Bilateral leg edema   2. Essential hypertension   3. Hyperlipidemia LDL goal <70   4. Controlled type 2 diabetes mellitus without complication, without long-term current use of insulin ([place])   5. 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  I recommended some protein shake.  As for lower extremity edema, I recommended continue on the current dose of 20 mg Lasix 4 times a week.  Though we can theoretically increase the Lasix to daily dosing, however I do not think the family cannot accommodate with increased urine output at this time.  Daughter seems to be quite exhausted with caring for the patient.  Daughter wish to consolidate his care.  Lower extremity edema can be managed by primary care provider, if worsens, cardiology service will be available as needed.  Hypertension: Blood pressure stable  Hyperlipidemia on Lipitor  DM2: Managed by primary care provider History of DVT: On Coumadin           Medication Adjustments/Labs and Tests Ordered: Current medicines are reviewed at length with the patient today.  Concerns regarding medicines are outlined above.  No orders of the defined types were placed in this encounter.  No orders of the defined types were placed in this encounter.   Patient Instructions  Medication Instructions:  Your physician recommends that you continue on your current medications as directed. Please refer to the Current Medication list given to you today.  *If you need a refill on your cardiac medications before your next appointment, please call your  pharmacy*  Lab Work: NONE ordered at this time of appointment   If you have labs (blood work) drawn today and your tests are completely normal, you will receive your results only by: [place] (if you have MyChart) OR A paper copy in the mail If you have any lab test that is abnormal or we need to change your treatment, we will call you to review the results.   Testing/Procedures: NONE ordered at this time of appointment   Follow-Up: At [facility], you and your health needs are our priority.  As part of our continuing mission to provide you with exceptional heart care, we have created designated Provider Care Teams.  These Care Teams include your primary Cardiologist (physician) and Advanced Practice Providers (APPs -  Physician Assistants and Nurse Practitioners) who all work together to provide you with the care you need, when you need it.  Your next appointment:   As Needed    The format for your next appointment:   In Person  Provider:   Quay Burow, MD  or APP    Other Instructions Continue with wearing compression stockings for leg swelling  Drink more protein shakes  If swelling of legs worsen and/or increase, may take extra dose of lasix along with extra dose of potassium for 1-2 days then resume previous daily dose. However if patient needs extra  dose of Lasix for more than 2 times a week, then you should check with PCP about increasing of Lasix. "When increasing Lasix you will also need to increase Potassium"  Limit all fluids for the day to 32-64 fl oz. Fluid includes all drinks, coffee, juice, ice chips, soup, jello, and all other liquids.   Important Information About Sugar         Hilbert Corrigan, [place]  07/16/2022 11:17 PM    [place]

## 2022-07-16 ENCOUNTER — Encounter: Payer: Self-pay | Admitting: Physician Assistant

## 2022-07-17 ENCOUNTER — Encounter: Payer: Self-pay | Admitting: *Deleted

## 2022-07-21 ENCOUNTER — Other Ambulatory Visit (HOSPITAL_COMMUNITY): Payer: Medicare Other

## 2022-07-23 ENCOUNTER — Telehealth: Payer: Self-pay | Admitting: Cardiovascular Disease

## 2022-07-23 MED ORDER — POTASSIUM CHLORIDE CRYS ER 20 MEQ PO TBCR: 20.0000 meq | EXTENDED_RELEASE_TABLET | ORAL | 1 refills | Status: DC

## 2022-07-23 MED ORDER — FUROSEMIDE 20 MG PO TABS: 20.0000 mg | ORAL_TABLET | ORAL | 6 refills | Status: DC

## 2022-07-23 NOTE — Telephone Encounter (Signed)
*  STAT* If patient is at the pharmacy, call can be transferred to refill team.   1. Which medications need to be refilled? (please list name of each medication and dose if known)   furosemide (LASIX) 20 MG tablet    potassium chloride SA (KLOR-CON) 20 MEQ tablet   2. Which pharmacy/location (including street and city if local pharmacy) is medication to be sent to? [facility] [id number] - [place], [place]  3. Do they need a 30 day or 90 day supply?  90 day

## 2022-07-24 ENCOUNTER — Telehealth: Payer: Self-pay | Admitting: Cardiovascular Disease

## 2022-07-24 ENCOUNTER — Telehealth: Payer: Self-pay | Admitting: *Deleted

## 2022-07-24 NOTE — Telephone Encounter (Signed)
Patient with diagnosis of DVT on warfarin for anticoagulation.    Procedure: SPYGLASS GALLSTONE RETRIEVAL  Date of procedure: 08/01/22  Patient's anticoagulation indication is non-cardiac and is managed by another provider. I will defer to managing provider.

## 2022-07-24 NOTE — Telephone Encounter (Signed)
Called patient's pharmacy and spoke with [name]. She was informed that patient's wife cuts the tablet in half and give him the 2 halves of the tablet since he has trouble swallowing the whole 20 meq tablet. Confirmed that the Rx is for Potassium 20 meq daily,patient is to cut tablet in half and take the half tablets to equal 20 meq daily. She thanked me for calling and will update Rx

## 2022-07-24 NOTE — Telephone Encounter (Signed)
   Name: KEVEN OSBORN  DOB: [birth year]  MRN: [id number]   Primary Cardiologist: Quay Burow, MD  Chart reviewed as part of pre-operative protocol coverage.  The patient is on warfarin due to diagnosis of DVT.  Please refer to prescribing provider for clearance on holding warfarin.  Medical cardiac clearance was not requested.  I will route this recommendation to the requesting party via Epic fax function and remove from pre-op pool. Please call with questions.  Elgie Collard, PA-C 07/24/2022, 4:24 PM

## 2022-07-24 NOTE — Telephone Encounter (Signed)
   Pre-operative Risk Assessment    Patient Name: Robert Fisher  DOB: [birth year] MRN: [id number]      Request for Surgical Clearance    Procedure:   SPYGLASS GALLSTONE RETRIEVAL   Date of Surgery:  Clearance 08/01/22                                 Surgeon:  DR. Camillia Herter SUTTLE  Surgeon's Group or Practice Name:  [facility] Phone number:  [phone] Fax number:  [phone]   Type of Clearance Requested:   - Pharmacy:  Hold Aspirin and Warfarin (Coumadin) PER DR. DYLAN SUTTLE: TO HOLD COUMADIN x 7 DAYS PRIOR AND TO HOLD ASA x 3-5 DAYS PRIOR TO PROCEDURE    Type of Anesthesia:   MODERATE SEDATION   Additional requests/questions:    Jiles Prows   07/24/2022, 9:33 AM

## 2022-07-24 NOTE — Telephone Encounter (Signed)
Follow Up:     Need to confirm directions for  Potassium that was called in yesterday please.

## 2022-07-28 ENCOUNTER — Other Ambulatory Visit (HOSPITAL_COMMUNITY): Payer: Self-pay | Admitting: Interventional Radiology

## 2022-07-28 ENCOUNTER — Other Ambulatory Visit: Payer: Medicare Other | Admitting: *Deleted

## 2022-07-28 DIAGNOSIS — Z515 Encounter for palliative care: Secondary | ICD-10-CM

## 2022-07-28 DIAGNOSIS — K8000 Calculus of gallbladder with acute cholecystitis without obstruction: Secondary | ICD-10-CM

## 2022-07-28 NOTE — Progress Notes (Signed)
[facility] RN NOTE  PATIENT NAME: Robert Fisher DOB: December 21, 1933 MRN: [id number]  PRIMARY CARE PROVIDER: Shon Baton, MD  RESPONSIBLE PARTY:  Acct ID - Guarantor Home Phone Work Phone Relationship Acct Type  [id number] Desma [phone]  Self P/F     [address], [place], [place] [address]   Upon arrival to patient's residence, EMS is present at his home. I circled around the block and when I returned EMS had left and the patient's door was shut. I called [name] and left a voicemail to advise that I saw the EMS truck outside of the home and asked if he was taken to the hospital. No return call. I also sent her a text message and still no reply. Will try contacting daughter again tomorrow to check on patient's disposition and if she still needs his INR checked.    Daryl Eastern, RN BSN

## 2022-07-30 ENCOUNTER — Other Ambulatory Visit: Payer: Self-pay | Admitting: Radiology

## 2022-07-30 DIAGNOSIS — K8081 Other cholelithiasis with obstruction: Secondary | ICD-10-CM

## 2022-07-31 ENCOUNTER — Other Ambulatory Visit: Payer: Self-pay | Admitting: Radiology

## 2022-08-01 ENCOUNTER — Ambulatory Visit (HOSPITAL_COMMUNITY)
Admission: RE | Admit: 2022-08-01 | Discharge: 2022-08-01 | Disposition: A | Discharge status: Home / Self Care | Payer: Medicare Other | Source: Ambulatory Visit | Attending: Interventional Radiology | Admitting: Interventional Radiology

## 2022-08-01 ENCOUNTER — Other Ambulatory Visit (HOSPITAL_COMMUNITY): Payer: Self-pay | Admitting: Interventional Radiology

## 2022-08-01 DIAGNOSIS — R600 Localized edema: Secondary | ICD-10-CM | POA: Diagnosis not present

## 2022-08-01 DIAGNOSIS — E119 Type 2 diabetes mellitus without complications: Secondary | ICD-10-CM | POA: Insufficient documentation

## 2022-08-01 DIAGNOSIS — K8081 Other cholelithiasis with obstruction: Secondary | ICD-10-CM

## 2022-08-01 DIAGNOSIS — K8012 Calculus of gallbladder with acute and chronic cholecystitis without obstruction: Secondary | ICD-10-CM | POA: Diagnosis present

## 2022-08-01 DIAGNOSIS — J449 Chronic obstructive pulmonary disease, unspecified: Secondary | ICD-10-CM | POA: Diagnosis not present

## 2022-08-01 DIAGNOSIS — Z7982 Long term (current) use of aspirin: Secondary | ICD-10-CM | POA: Insufficient documentation

## 2022-08-01 DIAGNOSIS — E785 Hyperlipidemia, unspecified: Secondary | ICD-10-CM | POA: Insufficient documentation

## 2022-08-01 DIAGNOSIS — Z85118 Personal history of other malignant neoplasm of bronchus and lung: Secondary | ICD-10-CM | POA: Insufficient documentation

## 2022-08-01 DIAGNOSIS — F200 Paranoid schizophrenia: Secondary | ICD-10-CM | POA: Diagnosis not present

## 2022-08-01 DIAGNOSIS — Z7901 Long term (current) use of anticoagulants: Secondary | ICD-10-CM | POA: Insufficient documentation

## 2022-08-01 DIAGNOSIS — K8000 Calculus of gallbladder with acute cholecystitis without obstruction: Secondary | ICD-10-CM

## 2022-08-01 DIAGNOSIS — I10 Essential (primary) hypertension: Secondary | ICD-10-CM | POA: Diagnosis not present

## 2022-08-01 HISTORY — PX: IR EXCHANGE BILIARY DRAIN: IMG6046

## 2022-08-01 HISTORY — PX: IR REMOVAL OF CALCULI/DEBRIS BILIARY DUCT/GB: IMG6054

## 2022-08-01 LAB — BASIC METABOLIC PANEL
Anion gap: 9 (ref 5–15)
BUN: 16 mg/dL (ref 8–23)
CO2: 21 mmol/L — ABNORMAL LOW (ref 22–32)
Calcium: 9.7 mg/dL (ref 8.9–10.3)
Chloride: 112 mmol/L — ABNORMAL HIGH (ref 98–111)
Creatinine, Ser: 1.21 mg/dL (ref 0.61–1.24)
GFR, Estimated: 59 mL/min — ABNORMAL LOW (ref >60)
Glucose, Bld: 149 mg/dL — ABNORMAL HIGH (ref 70–99)
Potassium: 4.4 mmol/L (ref 3.5–5.1)
Sodium: 142 mmol/L (ref 135–145)

## 2022-08-01 LAB — CBC
HCT: 46.4 % (ref 39.0–52.0)
Hemoglobin: 14.2 g/dL (ref 13.0–17.0)
MCH: 29 pg (ref 26.0–34.0)
MCHC: 30.6 g/dL (ref 30.0–36.0)
MCV: 94.9 fL (ref 80.0–100.0)
Platelets: 233 10*3/uL (ref 150–400)
RBC: 4.89 MIL/uL (ref 4.22–5.81)
RDW: 16.6 % — ABNORMAL HIGH (ref 11.5–15.5)
WBC: 8.5 10*3/uL (ref 4.0–10.5)
nRBC: 0 % (ref 0.0–0.2)

## 2022-08-01 LAB — PROTIME-INR
INR: 1.2 (ref 0.8–1.2)
Prothrombin Time: 15.2 seconds (ref 11.4–15.2)

## 2022-08-01 LAB — GLUCOSE, CAPILLARY: Glucose-Capillary: 129 mg/dL — ABNORMAL HIGH (ref 70–99)

## 2022-08-01 MED ORDER — FENTANYL CITRATE (PF) 100 MCG/2ML IJ SOLN
INTRAMUSCULAR | Status: AC
  Filled 2022-08-01: qty 2

## 2022-08-01 MED ORDER — SODIUM CHLORIDE 0.9% FLUSH: 5.0000 mL | Freq: Three times a day (TID) | INTRAVENOUS | Status: DC

## 2022-08-01 MED ORDER — LIDOCAINE HCL 1 % IJ SOLN
INTRAMUSCULAR | Status: AC
  Filled 2022-08-01: qty 20

## 2022-08-01 MED ORDER — MIDAZOLAM HCL 2 MG/2ML IJ SOLN
INTRAMUSCULAR | Status: AC | PRN
  Administered 2022-08-01: 1 mg via INTRAVENOUS
  Administered 2022-08-01 (×6): .5 mg via INTRAVENOUS

## 2022-08-01 MED ORDER — SODIUM CHLORIDE 0.9 % IV SOLN
2.0000 g | Freq: Once | INTRAVENOUS | Status: AC
  Administered 2022-08-01: 2 g via INTRAVENOUS
  Filled 2022-08-01 (×2): qty 2

## 2022-08-01 MED ORDER — SODIUM CHLORIDE 0.9 % IV SOLN: INTRAVENOUS | Status: DC

## 2022-08-01 MED ORDER — MIDAZOLAM HCL 2 MG/2ML IJ SOLN
INTRAMUSCULAR | Status: AC
  Filled 2022-08-01: qty 2

## 2022-08-01 MED ORDER — FENTANYL CITRATE (PF) 100 MCG/2ML IJ SOLN
INTRAMUSCULAR | Status: AC | PRN
  Administered 2022-08-01 (×5): 25 ug via INTRAVENOUS
  Administered 2022-08-01: 50 ug via INTRAVENOUS

## 2022-08-01 MED ORDER — IOHEXOL 300 MG/ML  SOLN
100.0000 mL | Freq: Once | INTRAMUSCULAR | Status: AC | PRN
  Administered 2022-08-01: 15 mL

## 2022-08-01 NOTE — Procedures (Signed)
Interventional Radiology Procedure Note  Procedure:  1) Percutaneous fluoroscopic and choledochoscopic (Spyglass) assisted gallstone retrieval 2) Cholecystostomy tube exchange  Findings: Please refer to procedural dictation for full description. 2 prominent gallstones retrieved.  No evidence of choledocholithiasis.  Upsized to 14 Fr cholecystostomy drain to bag drainage.       Complications: None immediate.  Estimated Blood Loss: < 5 mL  Recommendations: 2 hours bedrest Resume Coumadin and ASA tomorrow. Follow up in 1 month for cholecystostomy tube check and possible initiation of capping trial.   Ruthann Cancer, MD Pager: [phone]

## 2022-08-01 NOTE — Discharge Instructions (Signed)
Flush catheter three times a day with 5 cc's of normal saline.       Bring this sheet to all of your post-operative appointments while you have your drains. Please measure your drains by CC's or ML's. Make sure you drain and measure your drains 3 times per day. At the end of each day, add up totals for the left side and add up totals for the right side.    ( 9 am )     ( 3 pm )        ( 9 pm )                Date L  R  L  R  L  R  Total L/R

## 2022-08-01 NOTE — H&P (Signed)
Chief Complaint: Acalculous cholecystitis. Patient presents for cholecystomy tube with biliary stone extraction.   Supervising Physician: Ruthann Cancer  Patient Status: [facility] - Out-pt  History of Present Illness: Robert Fisher is a 88 y.o. 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 Presented to the ED At [facility] on 6.19.23  with persistent and worsening bilateral lower extremity edema, RUQ pain and generalized weakness. Found to have acute cholecystitis. CT CAP from 6.19.23 reads CT findings consistent with acute cholecystitis. HIDA scan from 6.22.2 reads Obstructed cystic duct and findings of cholecystitis correlating. IR placed a cholecystostomy tube placement (Dr. Maryelizabeth Kaufmann) on 6.23.23  Last exchange was on 8.8.23 Patient was capped at that time. The patient and his daughter were seen for consultation in the Interventional Radiology Clinic (via telehealth) on 8.11.23 with Dr.D. Suttle  The patient was given several different treatment options and the patient decided to pursue stone extraction. The Patient  presented for cholecystotomy tube exchange with biliary stone extraction.  Daughter at bedside. Patient alert and laying in bed, calm. Denies any abdominal pain, nausea or vomiting while capped Denies any fevers, headache, chest pain, SOB, cough or bleeding. Return precautions and treatment recommendations and follow-up discussed with the patient and his daughter who are agreeable with the plan.  Past Medical History:  Diagnosis Date   Borderline diabetes    takes Metformin   Cancer ([place])    lung 2020 per spouse LUL   COPD (chronic obstructive pulmonary disease) ([place])    Diabetes mellitus without complication ([place])    DVT (deep venous thrombosis) ([place])    Full dentures    Hearing aid worn    B/L   HOH (hard of hearing)    Hypertension    Left upper lobe pulmonary nodule    Memory  problem    Pneumonia    Schizo affective schizophrenia ([place])    Seizure ([place]) 05/21/2022   Seizures ([place])     Past Surgical History:  Procedure Laterality Date   cancer removal     legs   CATARACT EXTRACTION     COLONOSCOPY     FUDUCIAL PLACEMENT N/A 03/09/2019   Procedure: PLACEMENT OF FUDUCIAL;  Surgeon: [name], MD;  Location: [place];  Service: Thoracic;  Laterality: N/A;   IR EXCHANGE BILIARY DRAIN  07/08/2022   IR PERC CHOLECYSTOSTOMY  05/23/2022   IR RADIOLOGIST EVAL & MGMT  07/11/2022   THROMBECTOMY     VIDEO BRONCHOSCOPY WITH ENDOBRONCHIAL NAVIGATION N/A 03/09/2019   Procedure: VIDEO BRONCHOSCOPY WITH ENDOBRONCHIAL NAVIGATION;  Surgeon: [name], MD;  Location: [facility] OR;  Service: Thoracic;  Laterality: N/A;    Allergies: Haloperidol lactate, Carbamazepine, and Sildenafil  Medications: Prior to Admission medications   Medication Sig Start Date End Date Taking? Authorizing Provider  aspirin EC 81 MG tablet Take 81 mg by mouth at bedtime.   Yes [provider]  atorvastatin (LIPITOR) 40 MG tablet Take 1 tablet (40 mg total) by mouth at bedtime. 06/03/22  Yes Eugenie Filler, MD  Brimonidine Tartrate (LUMIFY) 0.025 % SOLN Place 1 drop into both eyes daily as needed (dry eyes).   Yes [provider]  buPROPion (WELLBUTRIN) 100 MG tablet Take 100 mg by mouth in the morning.   Yes [provider]  Calcium Carb-Cholecalciferol (CALCIUM 500 + D PO) Take 1 tablet by mouth daily.   Yes [provider]  clonazePAM (KLONOPIN) 0.5  MG tablet Take 0.25 mg by mouth in the morning and at bedtime.   Yes [provider]  Emollient (CERAVE MOISTURIZING EX) Apply 1 application  topically 2 (two) times daily. Apply to bilateral toes-to-calves daily   Yes [provider]  fenofibrate 160 MG tablet Take 160 mg by mouth daily.   Yes [provider]  furosemide (LASIX) 20 MG tablet Take 1 tablet (20 mg total) by  mouth See admin instructions. Take 20 mg by mouth in the morning on Mon/Tues/Wed/Fri Patient taking differently: Take 20-40 mg by mouth See admin instructions. Take 20 mg by mouth in the morning on Mon/Tues/Wed/Fri May increase dose to 40 mg twice weekly as needed for swelling 07/23/22  Yes Lorretta Harp, MD  levETIRAcetam (KEPPRA) 500 MG tablet Take 1/2 tablet twice a day 06/20/22  Yes Cameron Sprang, MD  linagliptin (TRADJENTA) 5 MG TABS tablet Take 5 mg by mouth every other day.   Yes [provider]  liver oil-zinc oxide (DESITIN) 40 % ointment Apply 1 Application topically as needed for irritation.   Yes [provider]  metFORMIN (GLUCOPHAGE) 500 MG tablet Take 500 mg by mouth in the morning and at bedtime.   Yes [provider]  metoprolol tartrate (LOPRESSOR) 25 MG tablet Take 25 mg by mouth daily.    Yes [provider]  Multiple Vitamins-Minerals (MULTIVITAMIN GUMMIES MENS) CHEW Chew 1 tablet by mouth daily.   Yes [provider]  OLANZapine (ZYPREXA) 10 MG tablet Take 20 mg by mouth at bedtime.   Yes [provider]  Omega-3 Fatty Acids (FISH OIL ULTRA) 1400 MG CAPS Take 1,400 mg by mouth daily.   Yes [provider]  polyethylene glycol (MIRALAX / GLYCOLAX) 17 g packet Take 8.5 g by mouth daily.   Yes [provider]  potassium chloride SA (KLOR-CON M) 20 MEQ tablet Take 1 tablet (20 mEq total) by mouth See admin instructions. Take TWO HALVES of one 20 mEq tablet by mouth every morning to equal a total dose of 20 mEq Patient taking differently: Take 20-40 mEq by mouth See admin instructions. Take TWO HALVES of one 20 mEq tablet by mouth every morning to equal a total dose of 20 mEq Increase to 40 meq on the same days he takes 40 mg of Lasix 07/23/22  Yes Lorretta Harp, MD  Probiotic Product (PROBIOTIC PO) Take 1 capsule by mouth in the morning.   Yes [provider]  triamcinolone cream (KENALOG) 0.1 %  Apply 1 Application topically 2 (two) times daily.   Yes [provider]  TYLENOL 8 HOUR ARTHRITIS PAIN 650 MG CR tablet Take 1,300 mg by mouth in the morning and at bedtime.   Yes [provider]  oxyCODONE (OXY IR/ROXICODONE) 5 MG immediate release tablet Take 1 tablet (5 mg total) by mouth every 6 (six) hours as needed for moderate pain or breakthrough pain. Patient not taking: Reported on 07/31/2022 05/27/22   Eugenie Filler, MD  warfarin (COUMADIN) 2 MG tablet Take 2 mg by mouth at bedtime.    [provider]     No family history on file.     Review of Systems: A 12 point ROS discussed and pertinent positives are indicated in the HPI above.  All other systems are negative.  Review of Systems  Constitutional:  Negative for fever.  HENT:  Negative for congestion.   Respiratory:  Negative for cough and shortness of breath.  Cardiovascular:  Negative for chest pain.  Gastrointestinal:  Negative for abdominal pain.  Neurological:  Negative for headaches.  Psychiatric/Behavioral:  Negative for behavioral problems and confusion.     Vital Signs: BP (!) 150/74   Pulse 72   Temp (!) 97.4 F (36.3 C) (Temporal)   Resp 16   Ht 5\' 8"  (1.727 m)   Wt 180 lb (81.6 kg)   SpO2 95%   BMI 27.37 kg/m    Physical Exam Vitals and nursing note reviewed.  Constitutional:      Appearance: He is well-developed.  HENT:     Head: Normocephalic.  Cardiovascular:     Rate and Rhythm: Normal rate and regular rhythm.     Heart sounds: Normal heart sounds.  Pulmonary:     Effort: Pulmonary effort is normal.     Breath sounds: Normal breath sounds.  Musculoskeletal:        General: Normal range of motion.     Cervical back: Normal range of motion.  Skin:    General: Skin is dry.  Neurological:     Mental Status: He is alert and oriented to person, place, and time.     Imaging: IR Radiologist Eval & Mgmt  Result Date: 07/17/2022 Please refer to notes tab  for details about interventional procedure. (Op Note)  IR EXCHANGE BILIARY DRAIN  Result Date: 07/09/2022 INDICATION: 88 year old gentleman with history of acute calculus cholecystitis status post cholecystostomy drain placement on 05/23/2022 returns to IR for cholangiogram and exchange. EXAM: Cholangiogram and cholecystostomy drain exchange MEDICATIONS: None ANESTHESIA/SEDATION: None FLUOROSCOPY TIME:  Radiation Exposure Index (as provided by the fluoroscopic device): 37.1 mGy Kerma COMPLICATIONS: None immediate. PROCEDURE: Informed written consent was obtained from the patient and is daughter after a thorough discussion of the procedural risks, benefits and alternatives. All questions were addressed. Maximal Sterile Barrier Technique was utilized including caps, mask, sterile gowns, sterile gloves, sterile drape, hand hygiene and skin antiseptic. A timeout was performed prior to the initiation of the procedure. Scout image demonstrated the cholecystostomy drain in appropriate position. Contrast administered through the drain showed multiple filling defects within the gallbladder consistent with stones. The cystic and common bile ducts for patent to the level of the duodenum. The existing drain was cut and removed over 0.035 inch guidewire and replaced with identical 10.2 [place] multipurpose pigtail drain. Final position of the drain was confirmed by injecting contrast under fluoroscopy. Drain was secured to skin with suture and capped. IMPRESSION: 1. Successful exchange of cholecystostomy drain with new 10.2 French catheter in place. 2. Patent cystic and common bile ducts on cholangiogram. Drain was capped. Bag and instructions provided to patient's daughter to reattached drain to bag if patient develops pain, nausea, fever, or chills. PLAN: Although patient's cystic duct is currently patent, given the gallstone burden, there is high likelihood of recurrent cystic duct obstruction. I discussed the case with Dr.  Merryl Hacker and we both agreed that it would be best to pursue Spyglass stone extraction for this patient. His office will refer him for IR clinic consultation. If Spyglass procedure is not performed, patient can be considered for drain removal if they pass 2 week capping trial. Electronically Signed   By: Miachel Roux M.D.   On: 07/09/2022 16:02    Labs:  CBC: Recent Labs    05/24/22 0134 05/25/22 0118 05/26/22 0149 08/01/22 1114  WBC 6.5 6.3 7.9 8.5  HGB 13.2 13.6 14.8 14.2  HCT 42.3 43.3 47.0 46.4  PLT 298 343  365 233    COAGS: Recent Labs    05/25/22 0118 05/26/22 0149 05/27/22 0118 08/01/22 1114  INR 1.3* 1.3* 1.3* 1.2    BMP: Recent Labs    05/25/22 0118 05/26/22 0149 05/27/22 0118 08/01/22 1114  NA 140 138 137 142  K 3.4* 4.0 3.8 4.4  CL 107 105 107 112*  CO2 26 26 25  21*  GLUCOSE 129* 177* 169* 149*  BUN 9 8 8 16   CALCIUM 9.1 9.1 8.8* 9.7  CREATININE 1.10 1.11 0.94 1.21  GFRNONAA >60 >60 >60 59*    LIVER FUNCTION TESTS: Recent Labs    05/24/22 0134 05/25/22 0118 05/26/22 0149 05/27/22 0118  BILITOT 0.7 0.5 0.5 0.6  AST 116* 78* 56* 51*  ALT 152* 113* 92* 72*  ALKPHOS 70 65 66 60  PROT 5.5* 5.8* 6.2* 5.5*  ALBUMIN 2.3* 2.4* 2.6* 2.4*    =  Assessment and Plan:   43 y,o male outpatient.  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 Presented to the ED At [facility] on 6.19.23  with persistent and worsening bilateral lower extremity edema, RUQ pain and generalized weakness. Found to have acute cholecystitis. CT CAP from 6.19.23 reads CT findings consistent with acute cholecystitis. HIDA scan from 6.22.2 reads Obstructed cystic duct and findings of cholecystitis correlating. IR placed a cholecystostomy tube placement (Dr. Maryelizabeth Kaufmann) on 6.23.23  Last exchange was on 8.8.23 Patient was capped at that time. The patient and his daughter were seen for consultation in the  Interventional Radiology Clinic (via telehealth) on 8.11.23 with Dr.D. Suttle  The patient was given several different treatment options and the patient decided to pursue stone extraction. The Patient  presented for cholecystotomy tube exchange with biliary stone extraction.   All labs are within acceptable parameters. Patient is on coumadin and ASA. No pertinent allergies. Patient has been NPO since midnight.  Risks and benefits discussed with the patient including, but not limited to bleeding, infection, gallbladder perforation, bile leak, sepsis or even death.  All of the patient's questions were answered, patient is agreeable to proceed. Consent signed and in chart.   Thank you for this interesting consult.  I greatly enjoyed meeting Robert Fisher and look forward to participating in their care.  A copy of this report was sent to the requesting provider on this date.  Electronically Signed: Jacqualine Mau, NP 08/01/2022, 12:56 PM   I spent a total of  40 Minutes   in face to face in clinical consultation, greater than 50% of which was counseling/coordinating care for cholecystomy tube with biliary stone extraction.

## 2022-08-01 NOTE — Sedation Documentation (Signed)
Patient transported to short stay. Anisha RN at the bedside to receive patient. Per Dr. Serafina Royals, patient may resume warfarin/aspirin the morning of 9/2

## 2022-08-05 ENCOUNTER — Other Ambulatory Visit: Payer: Medicare Other | Admitting: *Deleted

## 2022-08-05 DIAGNOSIS — Z515 Encounter for palliative care: Secondary | ICD-10-CM

## 2022-08-05 NOTE — Progress Notes (Signed)
[facility] RN NOTE  PATIENT NAME: Robert Fisher DOB: 1934-02-28 MRN: [id number]  PRIMARY CARE PROVIDER: Shon Baton, MD  RESPONSIBLE PARTY: [name] (daughter) Acct ID - Guarantor Home Phone Work Phone Relationship Acct Type  [id number] [facility]* [phone]  Self P/F     [address], [place], [place] [id number]    RN follow up visit made to check patient's INR. Patient was taken off Coumadin on 07/25/22 to undergo a spy glass treatment on 08/01/22 for his gallbladder. He just started back on his Coumadin and Aspirin last night. INR today is 1.3 and PT is 14.9. Daughter called Dr. Keane Police office while I was present in the home and we left a message to provide them with results. Office will contact daughter with new orders if any. Palliative care will continue to follow.\   Daryl Eastern, RN BSN

## 2022-08-07 ENCOUNTER — Other Ambulatory Visit: Payer: Self-pay | Admitting: Interventional Radiology

## 2022-08-07 DIAGNOSIS — K8000 Calculus of gallbladder with acute cholecystitis without obstruction: Secondary | ICD-10-CM

## 2022-08-08 ENCOUNTER — Telehealth: Payer: Self-pay | Admitting: Cardiovascular Disease

## 2022-08-08 NOTE — Telephone Encounter (Signed)
Pt c/o medication issue:  1. Name of Medication: furosemide (LASIX) 20 MG tablet  2. How are you currently taking this medication (dosage and times per day)? Take 1 tablet (20 mg total) by mouth See admin instructions. Take 20 mg by mouth in the morning on Mon/Tues/Wed/FriPatient taking differently: Take 20-40 mg by mouth See admin instructions. Take 20 mg by mouth in the morning on Mon/Tues/Wed/Fri  May increase dose to 40 mg twice weekly as needed for swelling  3. Are you having a reaction (difficulty breathing--STAT)? No   4. What is your medication issue? Daughter calling in with questions concerning the patient medication. How often he should get the medication. Please advise

## 2022-08-08 NOTE — Telephone Encounter (Signed)
Number not in service.

## 2022-08-11 ENCOUNTER — Telehealth: Payer: Self-pay | Admitting: Cardiovascular Disease

## 2022-08-11 ENCOUNTER — Other Ambulatory Visit: Payer: Medicare Other | Admitting: *Deleted

## 2022-08-11 ENCOUNTER — Other Ambulatory Visit (HOSPITAL_COMMUNITY): Payer: Self-pay

## 2022-08-11 DIAGNOSIS — Z515 Encounter for palliative care: Secondary | ICD-10-CM

## 2022-08-11 MED ORDER — NORMAL SALINE FLUSH 0.9 % IV SOLN
INTRAVENOUS | 2 refills | Status: DC
  Filled 2022-08-11: qty 540, 18d supply, fill #0

## 2022-08-11 NOTE — Telephone Encounter (Signed)
Returned the call to the daughter. She stated that the PCP reduced the Furosemide and Potassium to three times a week due to decreased swelling. He now takes Furosemide 20 mg three times a day a week and Potassium 20 mEq three times a week.  The daughter is concerned about the decrease of the medications and would like Dr. Kennon Holter recommendations. She has been advised to monitor the patient for swelling or shortness of breath and if it increases then to call the PCP back. A palliative home nurse also comes out on a regular basis.

## 2022-08-11 NOTE — Telephone Encounter (Signed)
Pt c/o medication issue:  1. Name of Medication: furosemide (LASIX) 20 MG tablet potassium chloride SA (KLOR-CON M) 20 MEQ tablet  2. How are you currently taking this medication (dosage and times per day)?   3. Are you having a reaction (difficulty breathing--STAT)?   4. What is your medication issue? Pt's daughter calling to state that pt's PCP advised that they switch these two medications down to 3 days a week due to no longer having swelling in legs. Requesting call back to discuss this and to make sure this would be okay with cardiologist.

## 2022-08-11 NOTE — Progress Notes (Signed)
[facility] RN NOTE  PATIENT NAME: Robert Fisher DOB: [birth year] MRN: [id number]  PRIMARY CARE PROVIDER: Shon Baton, MD  RESPONSIBLE PARTY: [name] (daughter) Acct ID - Guarantor Home Phone Work Phone Relationship Acct Type  [id number] [facility]* [phone]  Self P/F     [place], [place], [place] [id number]    RN visit made to check patient's INR. INR today is 3.3. Daughter contacted Dr. Keane Police office to make aware. Voicemail left with both mine and daughter's contact information. Received a call back from Dr. Keane Police office. Current Coumadin dosing to remain the same. Request INR recheck in 10 days. Called and spoke with daughter Robert Fisher to make her aware. She verbalized understanding.   Daryl Eastern, RN BSN

## 2022-08-12 NOTE — Telephone Encounter (Signed)
The daughter has been made aware and verbalized her understanding.  Lorretta Harp, MD  You 16 hours ago (5:36 PM)    I think those recs are fine

## 2022-08-13 ENCOUNTER — Other Ambulatory Visit (HOSPITAL_COMMUNITY): Payer: Self-pay

## 2022-08-14 ENCOUNTER — Other Ambulatory Visit: Payer: Medicare Other | Admitting: Internal Medicine

## 2022-08-14 DIAGNOSIS — Z515 Encounter for palliative care: Secondary | ICD-10-CM

## 2022-08-14 DIAGNOSIS — R600 Localized edema: Secondary | ICD-10-CM

## 2022-08-14 DIAGNOSIS — K810 Acute cholecystitis: Secondary | ICD-10-CM

## 2022-08-14 DIAGNOSIS — N182 Chronic kidney disease, stage 2 (mild): Secondary | ICD-10-CM

## 2022-08-14 DIAGNOSIS — K5904 Chronic idiopathic constipation: Secondary | ICD-10-CM

## 2022-08-14 NOTE — Progress Notes (Signed)
[facility] Palliative Care Follow-Up Visit Telephone: [phone]  Fax: [phone]   Date of encounter: 08/14/22 12:13 PM PATIENT NAME: Robert Fisher [place] [address]   [phone] (home)  DOB: 03/18/1934 MRN: [id number] PRIMARY CARE PROVIDER:    Shon Baton, MD,  [place] [id number] [phone]  REFERRING PROVIDER:   Shon Baton, [place],  [place] [address] [phone]  RESPONSIBLE PARTY:    Contact Information     Name Relation Home Work [place] Daughter   [phone]   Zenas, Santa [phone]   Golliday,debbie Daughter [phone]  [phone]        I met face to face with patient and family in his home. Palliative Care was asked to follow this patient by consultation request of  Shon Baton, MD to address advance care planning and complex medical decision making. This is follow-up visit.                                     ASSESSMENT AND PLAN / RECOMMENDATIONS:   Advance Care Planning/Goals of Care: Goals include to maximize quality of life and symptom management. Patient/health care surrogate gave his/her permission to discuss.Our advance care planning conversation included a discussion about:    The value and importance of advance care planning  Experiences with loved ones who have been seriously ill or have died  Exploration of personal, cultural or spiritual beliefs that might influence medical decisions  Exploration of goals of care in the event of a sudden injury or illness  Identification  of a healthcare agent  Review and updating or creation of an  advance directive document . Decision not to resuscitate or to de-escalate disease focused treatments due to poor prognosis. CODE STATUS:  DNR, MOST completed previously and on file  Symptom Management/Plan: 1. Acute cholecystitis -doing much better since  spy glass treatment--new drain in and emptying well with gradually decreasing output -getting saline flushes tid; for f/u test 9/29 in hopes that drain can be capped and then maybe removed later if all goes well  2. Chronic idiopathic constipation -had bm this am, eating well, continue 1/2 miralax daily to keep this going--he sometimes declines taking it so encouraged regular use unless diarrhea occurs  3. Bilateral leg edema -doing better here, as well, elevate at rest which has been challenging, had adjustments due to potassium levels; getting lasix and potassium m/w/f  4. CKD (chronic kidney disease) stage 2, GFR 60-89 ml/min -f/u labs per Dr. Lourena Simmonds be done by [place] fasting if needed   5. Palliative care encounter -overall doing better than first visit with ability to transfer with assistance now with [facility] therapy -some challenges occurred b/c his wife broke her arm so she could not assist at home either -has caregiver coming in though, as well, but mostly their daughter cares for him -they await f/u to widen the door from the company contacted -Mer has not gotten the chace to get back with the [employer] as our social worker recommended to see if his eligibility/benefits will be improved     Follow up Palliative Care Visit: Palliative care will continue to follow for complex medical decision making, advance care planning, and clarification of goals. Return 12/14 $RemoveBefo'@1130am'[email or web address]$     This visit was coded based on medical decision making (MDM).  PPS:  40%  HOSPICE ELIGIBILITY/DIAGNOSIS: TBD  Chief Complaint: Follow-up palliative visit  HISTORY OF PRESENT ILLNESS:  Robert Fisher is a 88 y.o. year old male  with schizophrenia under control, cholecystitis with drain, lung mass, DMII, CKD2, edema and constipation among others seen in f/u in his home.  He lives with his wife and his daughter spends most of her time with them when not working.  Her husband also helps and they do have a few hours of  caregiving each day now.  Both now have lift chairs.  He's eating well.  Had bm this am.  Sugars run up when he eats in the middle of the night.  He's taking 2 tylenol arthritis twice a day for pain.  9/21, he's to get labs including INR, potassium and Dr. Virgina Jock suggested labs be fasting per pt's dtr  .   Pt told me all about he built the original [address] way back and some other bridges.  History obtained from review of EMR, discussion with primary team, and interview with family, facility staff/caregiver and/or Mr. Carrier.  I reviewed available labs, medications, imaging, studies and related documents from the EMR.  Records reviewed and summarized above.   ROS Review of Systemsas in hpi  Physical Exam:  Wt Readings from Last 500 Encounters:  08/01/22 180 lb (81.6 kg)  07/14/22 180 lb (81.6 kg)  06/20/22 180 lb (81.6 kg)  05/27/22 192 lb 10.9 oz (87.4 kg)  04/21/22 185 lb (83.9 kg)  04/02/22 185 lb (83.9 kg)  03/29/22 176 lb (79.8 kg)  03/28/22 176 lb 5.9 oz (80 kg)  08/12/21 176 lb (79.8 kg)  08/08/21 175 lb (79.4 kg)  07/30/21 175 lb (79.4 kg)  04/23/20 201 lb 1 oz (91.2 kg)  03/03/19 201 lb 1.6 oz (91.2 kg)  03/03/19 201 lb 1.6 oz (91.2 kg)  01/25/13 205 lb (93 kg)   Physical Exam Constitutional:      Appearance: Normal appearance. He is obese.  HENT:     Head: Normocephalic and atraumatic.  Cardiovascular:     Rate and Rhythm: Normal rate and regular rhythm.  Pulmonary:     Effort: Pulmonary effort is normal.     Breath sounds: Normal breath sounds. No wheezing, rhonchi or rales.  Abdominal:     General: Bowel sounds are normal.     Palpations: Abdomen is soft.     Tenderness: There is no abdominal tenderness.     Comments: RUQ drain with small amount of light brown liquid  Musculoskeletal:        General: Normal range of motion.  Skin:    General: Skin is warm and dry.     Comments: Erythema of legs improved  Neurological:     General: No focal  deficit present.     Mental Status: He is alert and oriented to person, place, and time.  Psychiatric:        Mood and Affect: Mood normal.     Comments: Pleasant and sociable today, talkative     CURRENT PROBLEM LIST:  Patient Active Problem List   Diagnosis Date Noted   Seizure ([place]) 05/21/2022   CKD (chronic kidney disease) stage 2, GFR 60-89 ml/min 05/21/2022   DM (diabetes mellitus), type 2 ([place]) 05/21/2022   Constipation 05/21/2022   Bilateral leg edema    Elevated INR    LFT elevation    Acute cholecystitis 05/19/2022   Essential hypertension 04/02/2022   Lower extremity edema 04/02/2022   Hallux malleus  04/01/2021   Malignant neoplasm of upper lobe of left lung ([place]) 06/25/2020   Dermatochalasis of both lower eyelids 10/06/2017   Dermatochalasis of both upper eyelids 10/06/2017   Melanoma of back ([place]) 10/18/2013   ADVEF, DRUG/MEDICINAL/BIOLOGICAL SUBST NOS 08/30/2007   HYPERLIPIDEMIA NEC/NOS 06/08/2007   Paranoid schizophrenia in remission ([place]) 03/19/2007   PERIPHERAL VASCULAR DISEASE 03/19/2007   SKIN CANCER, HX OF 03/19/2007   PULMONARY EMBOLISM, HX OF 03/19/2007    PAST MEDICAL HISTORY:  Active Ambulatory Problems    Diagnosis Date Noted   HYPERLIPIDEMIA NEC/NOS 06/08/2007   Paranoid schizophrenia in remission ([place]) 03/19/2007   PERIPHERAL VASCULAR DISEASE 03/19/2007   ADVEF, DRUG/MEDICINAL/BIOLOGICAL SUBST NOS 08/30/2007   SKIN CANCER, HX OF 03/19/2007   PULMONARY EMBOLISM, HX OF 03/19/2007   Malignant neoplasm of upper lobe of left lung ([place]) 06/25/2020   Hallux malleus 04/01/2021   Essential hypertension 04/02/2022   Lower extremity edema 04/02/2022   Dermatochalasis of both lower eyelids 10/06/2017   Dermatochalasis of both upper eyelids 10/06/2017   Melanoma of back ([place]) 10/18/2013   Acute cholecystitis 05/19/2022   Seizure ([place]) 05/21/2022   CKD (chronic kidney disease) stage 2, GFR 60-89 ml/min 05/21/2022   DM (diabetes mellitus), type 2 ([place])  05/21/2022   Constipation 05/21/2022   Bilateral leg edema    Elevated INR    LFT elevation    Resolved Ambulatory Problems    Diagnosis Date Noted   Lung nodule 03/03/2019   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 [facility]) 03/30/2019   Past Medical History:  Diagnosis Date   Borderline diabetes    Cancer (HCC)    COPD (chronic obstructive pulmonary disease) ([place])    Diabetes mellitus without complication ([place])    DVT (deep venous thrombosis) ([place])    Full dentures    Hearing aid worn    HOH (hard of hearing)    Hypertension    Left upper lobe pulmonary nodule    Memory problem    Pneumonia    Schizo affective schizophrenia ([place])    Seizures ([place])     SOCIAL HX:  Social History   Tobacco Use   Smoking status: Former    Packs/day: 1.00    Years: 50.00    Total pack years: 50.00    Types: Cigarettes, Pipe    Start date: 71    Quit date: 03/03/2015    Years since quitting: 7.4   Smokeless tobacco: Never   Tobacco comments:    30 yearsv cigarettes ; pipe 20 years  Substance Use Topics   Alcohol use: No     ALLERGIES:  Allergies  Allergen Reactions   Haloperidol Lactate Other (See Comments)    "Made him climb the walls."   Carbamazepine Other (See Comments)    Caused seizures    Sildenafil Other (See Comments)    "pain in right thigh."      PERTINENT MEDICATIONS:  Outpatient Encounter Medications as of 08/14/2022  Medication Sig   aspirin EC 81 MG tablet Take 81 mg by mouth at bedtime.   atorvastatin (LIPITOR) 40 MG tablet Take 1 tablet (40 mg total) by mouth at bedtime.   Brimonidine Tartrate (LUMIFY) 0.025 % SOLN Place 1 drop into both eyes daily as needed (dry eyes).   buPROPion (WELLBUTRIN) 100 MG tablet Take 100 mg by mouth in the morning.   Calcium Carb-Cholecalciferol (CALCIUM 500 + D PO) Take 1 tablet by mouth daily.   clonazePAM (KLONOPIN)  0.5 MG tablet Take 0.25 mg by mouth in the morning and at  bedtime.   Emollient (CERAVE MOISTURIZING EX) Apply 1 application  topically 2 (two) times daily. Apply to bilateral toes-to-calves daily   fenofibrate 160 MG tablet Take 160 mg by mouth daily.   furosemide (LASIX) 20 MG tablet Take 1 tablet (20 mg total) by mouth See admin instructions. Take 20 mg by mouth in the morning on Mon/Tues/Wed/Fri (Patient taking differently: Take 20-40 mg by mouth See admin instructions. Take 20 mg by mouth in the morning on Mon/Tues/Wed/Fri May increase dose to 40 mg twice weekly as needed for swelling)   levETIRAcetam (KEPPRA) 500 MG tablet Take 1/2 tablet twice a day   linagliptin (TRADJENTA) 5 MG TABS tablet Take 5 mg by mouth every other day.   liver oil-zinc oxide (DESITIN) 40 % ointment Apply 1 Application topically as needed for irritation.   metFORMIN (GLUCOPHAGE) 500 MG tablet Take 500 mg by mouth in the morning and at bedtime.   metoprolol tartrate (LOPRESSOR) 25 MG tablet Take 25 mg by mouth daily.    Multiple Vitamins-Minerals (MULTIVITAMIN GUMMIES MENS) CHEW Chew 1 tablet by mouth daily.   OLANZapine (ZYPREXA) 10 MG tablet Take 20 mg by mouth at bedtime.   Omega-3 Fatty Acids (FISH OIL ULTRA) 1400 MG CAPS Take 1,400 mg by mouth daily.   oxyCODONE (OXY IR/ROXICODONE) 5 MG immediate release tablet Take 1 tablet (5 mg total) by mouth every 6 (six) hours as needed for moderate pain or breakthrough pain. (Patient not taking: Reported on 07/31/2022)   polyethylene glycol (MIRALAX / GLYCOLAX) 17 g packet Take 8.5 g by mouth daily.   potassium chloride SA (KLOR-CON M) 20 MEQ tablet Take 1 tablet (20 mEq total) by mouth See admin instructions. Take TWO HALVES of one 20 mEq tablet by mouth every morning to equal a total dose of 20 mEq (Patient taking differently: Take 20-40 mEq by mouth See admin instructions. Take TWO HALVES of one 20 mEq tablet by mouth every morning to equal a total dose of 20 mEq Increase to 40 meq on the same days he takes 40 mg of Lasix)    Probiotic Product (PROBIOTIC PO) Take 1 capsule by mouth in the morning.   Sodium Chloride Flush (NORMAL SALINE FLUSH) 0.9 % SOLN Flush 3 times a day as directed   triamcinolone cream (KENALOG) 0.1 % Apply 1 Application topically 2 (two) times daily.   TYLENOL 8 HOUR ARTHRITIS PAIN 650 MG CR tablet Take 1,300 mg by mouth in the morning and at bedtime.   warfarin (COUMADIN) 2 MG tablet Take 2 mg by mouth at bedtime.   No facility-administered encounter medications on file as of 08/14/2022.    Thank you for the opportunity to participate in the care of Mr. Byrum.  The palliative care team will continue to follow. Please call our office at [phone] if we can be of additional assistance.   Hollace Kinnier, DO  COVID-19 PATIENT SCREENING TOOL Asked and negative response unless otherwise noted:  Have you had symptoms of covid, tested positive or been in contact with someone with symptoms/positive test in the past 5-10 days? No

## 2022-08-21 ENCOUNTER — Other Ambulatory Visit (HOSPITAL_COMMUNITY): Payer: Self-pay

## 2022-08-26 ENCOUNTER — Other Ambulatory Visit (HOSPITAL_COMMUNITY): Payer: Self-pay | Admitting: Radiology

## 2022-08-26 ENCOUNTER — Emergency Department (HOSPITAL_BASED_OUTPATIENT_CLINIC_OR_DEPARTMENT_OTHER): Payer: Medicare Other

## 2022-08-26 ENCOUNTER — Telehealth: Payer: Self-pay

## 2022-08-26 ENCOUNTER — Encounter (HOSPITAL_BASED_OUTPATIENT_CLINIC_OR_DEPARTMENT_OTHER): Payer: Self-pay

## 2022-08-26 ENCOUNTER — Inpatient Hospital Stay (HOSPITAL_BASED_OUTPATIENT_CLINIC_OR_DEPARTMENT_OTHER)
Admission: EM | Admit: 2022-08-26 | Discharge: 2022-08-30 | DRG: 920 | Disposition: A | Discharge status: Home Health | Payer: Medicare Other | Attending: Family Medicine | Admitting: Family Medicine

## 2022-08-26 ENCOUNTER — Other Ambulatory Visit: Payer: Self-pay

## 2022-08-26 DIAGNOSIS — N390 Urinary tract infection, site not specified: Secondary | ICD-10-CM | POA: Diagnosis present

## 2022-08-26 DIAGNOSIS — Z79899 Other long term (current) drug therapy: Secondary | ICD-10-CM

## 2022-08-26 DIAGNOSIS — T85628A Displacement of other specified internal prosthetic devices, implants and grafts, initial encounter: Principal | ICD-10-CM | POA: Diagnosis present

## 2022-08-26 DIAGNOSIS — R531 Weakness: Secondary | ICD-10-CM

## 2022-08-26 DIAGNOSIS — Z993 Dependence on wheelchair: Secondary | ICD-10-CM

## 2022-08-26 DIAGNOSIS — Y833 Surgical operation with formation of external stoma as the cause of abnormal reaction of the patient, or of later complication, without mention of misadventure at the time of the procedure: Secondary | ICD-10-CM | POA: Diagnosis present

## 2022-08-26 DIAGNOSIS — Z7982 Long term (current) use of aspirin: Secondary | ICD-10-CM

## 2022-08-26 DIAGNOSIS — Z86718 Personal history of other venous thrombosis and embolism: Secondary | ICD-10-CM

## 2022-08-26 DIAGNOSIS — F200 Paranoid schizophrenia: Secondary | ICD-10-CM | POA: Diagnosis present

## 2022-08-26 DIAGNOSIS — T85528A Displacement of other gastrointestinal prosthetic devices, implants and grafts, initial encounter: Secondary | ICD-10-CM | POA: Diagnosis present

## 2022-08-26 DIAGNOSIS — E785 Hyperlipidemia, unspecified: Secondary | ICD-10-CM | POA: Diagnosis present

## 2022-08-26 DIAGNOSIS — F32A Depression, unspecified: Secondary | ICD-10-CM | POA: Diagnosis present

## 2022-08-26 DIAGNOSIS — I110 Hypertensive heart disease with heart failure: Secondary | ICD-10-CM | POA: Diagnosis present

## 2022-08-26 DIAGNOSIS — L89312 Pressure ulcer of right buttock, stage 2: Secondary | ICD-10-CM | POA: Diagnosis present

## 2022-08-26 DIAGNOSIS — C3490 Malignant neoplasm of unspecified part of unspecified bronchus or lung: Secondary | ICD-10-CM | POA: Diagnosis present

## 2022-08-26 DIAGNOSIS — D72829 Elevated white blood cell count, unspecified: Secondary | ICD-10-CM

## 2022-08-26 DIAGNOSIS — N99512 Cystostomy malfunction: Secondary | ICD-10-CM | POA: Diagnosis present

## 2022-08-26 DIAGNOSIS — I10 Essential (primary) hypertension: Secondary | ICD-10-CM | POA: Diagnosis present

## 2022-08-26 DIAGNOSIS — Z7984 Long term (current) use of oral hypoglycemic drugs: Secondary | ICD-10-CM

## 2022-08-26 DIAGNOSIS — Z66 Do not resuscitate: Secondary | ICD-10-CM | POA: Diagnosis present

## 2022-08-26 DIAGNOSIS — Z888 Allergy status to other drugs, medicaments and biological substances status: Secondary | ICD-10-CM

## 2022-08-26 DIAGNOSIS — Z87891 Personal history of nicotine dependence: Secondary | ICD-10-CM

## 2022-08-26 DIAGNOSIS — G40909 Epilepsy, unspecified, not intractable, without status epilepticus: Secondary | ICD-10-CM | POA: Diagnosis present

## 2022-08-26 DIAGNOSIS — Z86711 Personal history of pulmonary embolism: Secondary | ICD-10-CM

## 2022-08-26 DIAGNOSIS — Z7901 Long term (current) use of anticoagulants: Secondary | ICD-10-CM

## 2022-08-26 DIAGNOSIS — J449 Chronic obstructive pulmonary disease, unspecified: Secondary | ICD-10-CM | POA: Diagnosis present

## 2022-08-26 DIAGNOSIS — I5032 Chronic diastolic (congestive) heart failure: Secondary | ICD-10-CM | POA: Diagnosis present

## 2022-08-26 DIAGNOSIS — E119 Type 2 diabetes mellitus without complications: Secondary | ICD-10-CM | POA: Diagnosis present

## 2022-08-26 LAB — URINALYSIS, ROUTINE W REFLEX MICROSCOPIC
Bilirubin Urine: NEGATIVE
Glucose, UA: NEGATIVE mg/dL
Hgb urine dipstick: NEGATIVE
Nitrite: POSITIVE — AB
Protein, ur: 30 mg/dL — AB
Specific Gravity, Urine: >1.046 — ABNORMAL HIGH (ref 1.005–1.030)
pH: 5.5 (ref 5.0–8.0)

## 2022-08-26 LAB — CBC WITH DIFFERENTIAL/PLATELET
Abs Immature Granulocytes: 0.09 10*3/uL — ABNORMAL HIGH (ref 0.00–0.07)
Basophils Absolute: 0 10*3/uL (ref 0.0–0.1)
Basophils Relative: 0 %
Eosinophils Absolute: 0.1 10*3/uL (ref 0.0–0.5)
Eosinophils Relative: 0 %
HCT: 44.7 % (ref 39.0–52.0)
Hemoglobin: 14.1 g/dL (ref 13.0–17.0)
Immature Granulocytes: 1 %
Lymphocytes Relative: 9 %
Lymphs Abs: 1.3 10*3/uL (ref 0.7–4.0)
MCH: 28 pg (ref 26.0–34.0)
MCHC: 31.5 g/dL (ref 30.0–36.0)
MCV: 88.7 fL (ref 80.0–100.0)
Monocytes Absolute: 1.2 10*3/uL — ABNORMAL HIGH (ref 0.1–1.0)
Monocytes Relative: 8 %
Neutro Abs: 12.7 10*3/uL — ABNORMAL HIGH (ref 1.7–7.7)
Neutrophils Relative %: 82 %
Platelets: 244 10*3/uL (ref 150–400)
RBC: 5.04 MIL/uL (ref 4.22–5.81)
RDW: 16.5 % — ABNORMAL HIGH (ref 11.5–15.5)
WBC: 15.3 10*3/uL — ABNORMAL HIGH (ref 4.0–10.5)
nRBC: 0 % (ref 0.0–0.2)

## 2022-08-26 LAB — COMPREHENSIVE METABOLIC PANEL
ALT: 16 U/L (ref 0–44)
AST: 23 U/L (ref 15–41)
Albumin: 4.1 g/dL (ref 3.5–5.0)
Alkaline Phosphatase: 48 U/L (ref 38–126)
Anion gap: 14 (ref 5–15)
BUN: 15 mg/dL (ref 8–23)
CO2: 21 mmol/L — ABNORMAL LOW (ref 22–32)
Calcium: 9.8 mg/dL (ref 8.9–10.3)
Chloride: 106 mmol/L (ref 98–111)
Creatinine, Ser: 0.97 mg/dL (ref 0.61–1.24)
GFR, Estimated: >60 mL/min (ref >60)
Glucose, Bld: 189 mg/dL — ABNORMAL HIGH (ref 70–99)
Potassium: 3.8 mmol/L (ref 3.5–5.1)
Sodium: 141 mmol/L (ref 135–145)
Total Bilirubin: 0.7 mg/dL (ref 0.3–1.2)
Total Protein: 7.1 g/dL (ref 6.5–8.1)

## 2022-08-26 LAB — LACTIC ACID, PLASMA
Lactic Acid, Venous: 1.5 mmol/L (ref 0.5–1.9)
Lactic Acid, Venous: 1.4 mmol/L (ref 0.5–1.9)

## 2022-08-26 LAB — PROTIME-INR
INR: 2.8 — ABNORMAL HIGH (ref 0.8–1.2)
Prothrombin Time: 29.5 seconds — ABNORMAL HIGH (ref 11.4–15.2)

## 2022-08-26 LAB — TROPONIN I (HIGH SENSITIVITY)
Troponin I (High Sensitivity): 46 ng/L — ABNORMAL HIGH (ref <18)
Troponin I (High Sensitivity): 57 ng/L — ABNORMAL HIGH (ref <18)

## 2022-08-26 MED ORDER — IOHEXOL 300 MG/ML  SOLN
100.0000 mL | Freq: Once | INTRAMUSCULAR | Status: AC | PRN
  Administered 2022-08-26: 80 mL via INTRAVENOUS

## 2022-08-26 MED ORDER — METRONIDAZOLE 500 MG/100ML IV SOLN
500.0000 mg | Freq: Once | INTRAVENOUS | Status: AC
  Administered 2022-08-26: 500 mg via INTRAVENOUS
  Filled 2022-08-26: qty 100

## 2022-08-26 MED ORDER — SODIUM CHLORIDE 0.9 % IV SOLN: Freq: Once | INTRAVENOUS | Status: AC

## 2022-08-26 MED ORDER — SODIUM CHLORIDE 0.9 % IV SOLN
2.0000 g | Freq: Once | INTRAVENOUS | Status: AC
  Administered 2022-08-26: 2 g via INTRAVENOUS
  Filled 2022-08-26: qty 20

## 2022-08-26 NOTE — Telephone Encounter (Signed)
Palliative Care  1257- Received message from Kandis Cocking, NP with [facility]. Returned her call and she said that the daughter, [name] had called regarding patients gallbladder drain and was concerned.  1304-Called and spoke with dtr- [name] who reported that "dad's drain has blood and drainage coming out around it and that the black tie is not where it is supposed to be." Did not know if suture was still intact. Reported that she had called [phone] but no answer. Requested this nurse try the number.  [place] [phone] regarding dtr concerns and was directed to speak with Anderson Malta, [place] who said she would be "happy to call dtr at [phone] and help trouble shoot the issue.

## 2022-08-26 NOTE — ED Triage Notes (Signed)
Pt BIB [facility]. Instructed by PCP to bring pt to [facility] ED only   Pt sent here for further evaluation of increase pain to his surgical site. Pt has a drainage bag from a recent cholecystomy, this is the 2nd bag that has been inserted, d/t needing a larger tube placed. They are also concerned for increased generalized weakness.  Pt has a hx of dementia and per family he is at his baseline    BP 112/60 HR 98 RR 16 94% RA  CBG 195

## 2022-08-26 NOTE — ED Provider Notes (Signed)
[facility] EMERGENCY DEPT Provider Note   CSN: [id number] Arrival date & time: 08/26/22  1521     History  Chief Complaint  Patient presents with   Weakness   Post-op Problem    Robert Fisher is a 88 y.o. male.  Patient is an 88 year old male postop day 25 from  percutaneous fluoroscopic and choledochoscopic (Spyglass) assisted gallstone retrieval and cholecystostomy tube exchange presenting for complaints of generalized weakness.  Still has cholecystostomy tube in place in the right upper quadrant of his abdomen.  States that she noticed the sutures were pulled out this morning along with the tube placed holder sticker attached.  States the tube is still draining appropriately and she drained it this morning prior to arrival.  Has concerns for patient's generalized weakness that is worsening.  Patient denies any fevers, chills, coughing.  Denies chest pain or shortness of breath.  Denies dysuria or increased frequency or urgency.  Patient is tolerating p.o. without difficulty.  Denies any falls or recent head trauma.  The history is provided by the patient. No language interpreter was used.  Weakness Associated symptoms: no abdominal pain, no arthralgias, no chest pain, no cough, no dysuria, no fever, no seizures, no shortness of breath and no vomiting        Home Medications Prior to Admission medications   Medication Sig Start Date End Date Taking? Authorizing Provider  aspirin EC 81 MG tablet Take 81 mg by mouth at bedtime.    [provider]  atorvastatin (LIPITOR) 40 MG tablet Take 1 tablet (40 mg total) by mouth at bedtime. 06/03/22   Eugenie Filler, MD  Brimonidine Tartrate (LUMIFY) 0.025 % SOLN Place 1 drop into both eyes daily as needed (dry eyes).    [provider]  buPROPion (WELLBUTRIN) 100 MG tablet Take 100 mg by mouth in the morning.    [provider]  Calcium Carb-Cholecalciferol (CALCIUM 500 + D PO) Take 1 tablet by  mouth daily.    [provider]  clonazePAM (KLONOPIN) 0.5 MG tablet Take 0.25 mg by mouth in the morning and at bedtime.    [provider]  Emollient (CERAVE MOISTURIZING EX) Apply 1 application  topically 2 (two) times daily. Apply to bilateral toes-to-calves daily    [provider]  fenofibrate 160 MG tablet Take 160 mg by mouth daily.    [provider]  furosemide (LASIX) 20 MG tablet Take 1 tablet (20 mg total) by mouth See admin instructions. Take 20 mg by mouth in the morning on Mon/Tues/Wed/Fri Patient taking differently: Take 20-40 mg by mouth See admin instructions. Take 20 mg by mouth in the morning on Mon/Tues/Wed/Fri May increase dose to 40 mg twice weekly as needed for swelling 07/23/22   Lorretta Harp, MD  levETIRAcetam (KEPPRA) 500 MG tablet Take 1/2 tablet twice a day 06/20/22   Cameron Sprang, MD  linagliptin (TRADJENTA) 5 MG TABS tablet Take 5 mg by mouth every other day.    [provider]  liver oil-zinc oxide (DESITIN) 40 % ointment Apply 1 Application topically as needed for irritation.    [provider]  metFORMIN (GLUCOPHAGE) 500 MG tablet Take 500 mg by mouth in the morning and at bedtime.    [provider]  metoprolol tartrate (LOPRESSOR) 25 MG tablet Take 25 mg by mouth daily.     [provider]  Multiple Vitamins-Minerals (MULTIVITAMIN GUMMIES MENS) CHEW Chew 1 tablet by mouth daily.  [provider]  OLANZapine (ZYPREXA) 10 MG tablet Take 20 mg by mouth at bedtime.    [provider]  Omega-3 Fatty Acids (FISH OIL ULTRA) 1400 MG CAPS Take 1,400 mg by mouth daily.    [provider]  oxyCODONE (OXY IR/ROXICODONE) 5 MG immediate release tablet Take 1 tablet (5 mg total) by mouth every 6 (six) hours as needed for moderate pain or breakthrough pain. Patient not taking: Reported on 07/31/2022 05/27/22   Eugenie Filler, MD  polyethylene glycol (MIRALAX / GLYCOLAX)  17 g packet Take 8.5 g by mouth daily.    [provider]  potassium chloride SA (KLOR-CON M) 20 MEQ tablet Take 1 tablet (20 mEq total) by mouth See admin instructions. Take TWO HALVES of one 20 mEq tablet by mouth every morning to equal a total dose of 20 mEq Patient taking differently: Take 20-40 mEq by mouth See admin instructions. Take TWO HALVES of one 20 mEq tablet by mouth every morning to equal a total dose of 20 mEq Increase to 40 meq on the same days he takes 40 mg of Lasix 07/23/22   Lorretta Harp, MD  Probiotic Product (PROBIOTIC PO) Take 1 capsule by mouth in the morning.    [provider]  Sodium Chloride Flush (NORMAL SALINE FLUSH) 0.9 % SOLN Flush 3 times a day as directed 08/11/22   Suttle, Rosanne Ashing, MD  triamcinolone cream (KENALOG) 0.1 % Apply 1 Application topically 2 (two) times daily.    [provider]  TYLENOL 8 HOUR ARTHRITIS PAIN 650 MG CR tablet Take 1,300 mg by mouth in the morning and at bedtime.    [provider]  warfarin (COUMADIN) 2 MG tablet Take 2 mg by mouth at bedtime.    [provider]      Allergies    Haloperidol lactate, Carbamazepine, and Sildenafil    Review of Systems   Review of Systems  Constitutional:  Negative for chills and fever.  HENT:  Negative for ear pain and sore throat.   Eyes:  Negative for pain and visual disturbance.  Respiratory:  Negative for cough and shortness of breath.   Cardiovascular:  Negative for chest pain and palpitations.  Gastrointestinal:  Negative for abdominal pain and vomiting.  Genitourinary:  Negative for dysuria and hematuria.  Musculoskeletal:  Negative for arthralgias and back pain.  Skin:  Negative for color change and rash.  Neurological:  Positive for weakness. Negative for seizures and syncope.  All other systems reviewed and are negative.   Physical Exam Updated Vital Signs BP (!) 131/57   Pulse 86   Temp 97.9 F (36.6 C) (Oral)   Resp 19   SpO2  92%  Physical Exam Vitals and nursing note reviewed.  Constitutional:      General: He is not in acute distress.    Appearance: He is well-developed.  HENT:     Head: Normocephalic and atraumatic.  Eyes:     Conjunctiva/sclera: Conjunctivae normal.  Cardiovascular:     Rate and Rhythm: Normal rate and regular rhythm.     Heart sounds: No murmur heard. Pulmonary:     Effort: Pulmonary effort is normal. No respiratory distress.     Breath sounds: Normal breath sounds.  Abdominal:     Palpations: Abdomen is soft.     Tenderness: There is no abdominal tenderness.    Musculoskeletal:        General: No swelling.     Cervical back:  Neck supple.  Skin:    General: Skin is warm and dry.     Capillary Refill: Capillary refill takes less than 2 seconds.  Neurological:     Mental Status: He is alert.  Psychiatric:        Mood and Affect: Mood normal.     ED Results / Procedures / Treatments   Labs (all labs ordered are listed, but only abnormal results are displayed) Labs Reviewed  COMPREHENSIVE METABOLIC PANEL - Abnormal; Notable for the following components:      Result Value   CO2 21 (*)    Glucose, Bld 189 (*)    All other components within normal limits  CBC WITH DIFFERENTIAL/PLATELET - Abnormal; Notable for the following components:   WBC 15.3 (*)    RDW 16.5 (*)    Neutro Abs 12.7 (*)    Monocytes Absolute 1.2 (*)    Abs Immature Granulocytes 0.09 (*)    All other components within normal limits  PROTIME-INR - Abnormal; Notable for the following components:   Prothrombin Time 29.5 (*)    INR 2.8 (*)    All other components within normal limits  TROPONIN I (HIGH SENSITIVITY) - Abnormal; Notable for the following components:   Troponin I (High Sensitivity) 46 (*)    All other components within normal limits  TROPONIN I (HIGH SENSITIVITY) - Abnormal; Notable for the following components:   Troponin I (High Sensitivity) 57 (*)    All other components within normal  limits  CULTURE, BLOOD (ROUTINE X 2)  CULTURE, BLOOD (ROUTINE X 2)  LACTIC ACID, PLASMA  LACTIC ACID, PLASMA  URINALYSIS, ROUTINE W REFLEX MICROSCOPIC    EKG EKG Interpretation  Date/Time:  Tuesday August 26 2022 15:42:32 EDT Ventricular Rate:  101 PR Interval:  213 QRS Duration: 92 QT Interval:  348 QTC Calculation: 452 R Axis:   -69 Text Interpretation: Sinus tachycardia Borderline prolonged PR interval Left anterior fascicular block Probable anterolateral infarct, old Minimal ST depression, anterolateral leads Confirmed by Campbell Stall ([id number]) on 0/96/0454 3:50:09 PM  Radiology CT ABDOMEN PELVIS W CONTRAST  Result Date: 08/26/2022 CLINICAL DATA:  Status post cystostomy and gastrostomy. Leukocytosis. Unspecified abdominal pain. EXAM: CT ABDOMEN AND PELVIS WITH CONTRAST TECHNIQUE: Multidetector CT imaging of the abdomen and pelvis was performed using the standard protocol following bolus administration of intravenous contrast. RADIATION DOSE REDUCTION: This exam was performed according to the departmental dose-optimization program which includes automated exposure control, adjustment of the mA and/or kV according to patient size and/or use of iterative reconstruction technique. CONTRAST:  44mL OMNIPAQUE IOHEXOL 300 MG/ML  SOLN COMPARISON:  05/19/2022 FINDINGS: Lower chest: The visualized lung bases are clear. Mild coronary artery calcification. Global cardiac size within normal limits. Small hiatal hernia. Hepatobiliary: Percutaneous cholecystostomy catheter has been withdrawn from the gallbladder lumen and is now seen within the perihepatic space immediately subjacent to the abdominal wall. There is a trace amount of perihepatic fluid likely representing bile in continuity with the percutaneous drainage catheter. The gallbladder is partially decompressed. Cholelithiasis again noted. Trace pericholecystic fluid and pericholecystic inflammatory changes noted, nonspecific in the setting of  recent cholecystostomy and biliary leakage. The liver is unremarkable. No intra or extrahepatic biliary ductal dilation. Pancreas: Unremarkable Spleen: Unremarkable Adrenals/Urinary Tract: Adrenal glands are unremarkable. Kidneys are normal, without renal calculi, focal lesion, or hydronephrosis. Bladder is unremarkable. Stomach/Bowel: Moderate colonic stool burden without evidence of obstruction. Stomach, small bowel, and large bowel are otherwise unremarkable. Appendix normal. No free intraperitoneal gas. Vascular/Lymphatic:  Aortic atherosclerosis. No enlarged abdominal or pelvic lymph nodes. Reproductive: Mild prostatic enlargement. Seminal vesicles are unremarkable. Other: Small fat containing right inguinal hernia. No abdominopelvic ascites. Musculoskeletal: Degenerative changes are seen within the lumbar spine and hips with degenerative ankylosis of L4-S1 no acute bone abnormality. IMPRESSION: 1. Percutaneous cholecystostomy catheter has been withdrawn from the gallbladder lumen and is now seen within the perihepatic space immediately subjacent to the abdominal wall. Small perihepatic fluid likely representing bile is in continuity with the drainage catheter. 2. Cholelithiasis. Trace pericholecystic fluid and pericholecystic inflammatory changes are nonspecific in the setting of recent cholecystostomy and biliary leakage. 3. Moderate colonic stool burden without evidence of obstruction. 4. Mild prostatic enlargement. Aortic Atherosclerosis (ICD10-I70.0). Electronically Signed   By: Fidela Salisbury M.D.   On: 08/26/2022 20:05   DG Chest Portable 1 View  Result Date: 08/26/2022 CLINICAL DATA:  Altered mental status. EXAM: PORTABLE CHEST 1 VIEW COMPARISON:  05/19/2022 FINDINGS: Stable cardiac enlargement. Unchanged blunting of the left costophrenic angle which likely reflects prominent left epicardial fat. No signs of interstitial edema or airspace consolidation. Fiducial markers localizing to the treated left  upper lobe tumor are again noted in appears similar to the previous exam. IMPRESSION: 1. Stable cardiac enlargement. 2. No acute cardiopulmonary abnormalities. Electronically Signed   By: Kerby Moors M.D.   On: 08/26/2022 16:33    Procedures Procedures    Medications Ordered in ED Medications  0.9 %  sodium chloride infusion (has no administration in time range)  metroNIDAZOLE (FLAGYL) IVPB 500 mg (0 mg Intravenous Stopped 08/26/22 2109)  cefTRIAXone (ROCEPHIN) 2 g in sodium chloride 0.9 % 100 mL IVPB (0 g Intravenous Stopped 08/26/22 2004)  iohexol (OMNIPAQUE) 300 MG/ML solution 100 mL (80 mLs Intravenous Contrast Given 08/26/22 1934)    ED Course/ Medical Decision Making/ A&P                           Medical Decision Making Amount and/or Complexity of Data Reviewed Labs: ordered. Radiology: ordered.  Risk Prescription drug management.   54:23 PM  88 year old male postop day 25 from  percutaneous fluoroscopic and choledochoscopic (Spyglass) assisted gallstone retrieval and cholecystostomy tube exchange presenting for complaints of generalized weakness.  Patient is alert, afebrile, with tachycardia of 101 bpm with otherwise stable vital signs.  Physical exam demonstrates Cholecystostomy tube in place.  Stitching pulled out.  Cholecystostomy tube placement sticker unattached.  No erythema, warmth, purulent drainage.  Brown drainage present in cholecystostomy tube bag.  Differential diagnosis includes but is not limited to sepsis, cholecystitis, anemia, ACS, urinary tract infection, etc.  Patient has leukocytosis with neutrophilia.  Otherwise no other sirs criteria to indicate sepsis.  Abdomen demonstrates some tenderness in the right upper quadrant.  Cholecystostomy tube is still in the abdomen but the stitches have been torn out.  CAT scan demonstrates that the cholecystostomy tube is no longer in the gallbladder but in the hepatic space.  It is still actively draining bile.   Concerns that patient's cholecystostomy tube retraction has resulted in repeat cholecystitis.  Patient has been treated with Rocephin and Flagyl.  He is otherwise comfortable at this time.  Stable liver profile.  I spoke with on-call general surgeon who recommends patient be seen by IR again.  Suggesting they may be able to use the previous tract to replace the tube into the gallbladder.  I spoke with admitting physician Dr. Marlyce Huge agrees to accept patient.  Request patient be  placed n.p.o. after midnight and maintenance fluids.  Patient worked up for other etiologies of weakness including stable EKG, troponins, electrolytes.  No anemia.  No hypoglycemia.  No focal or neurological deficits.  No signs of stroke.          Final Clinical Impression(s) / ED Diagnoses Final diagnoses:  Cystostomy malfunction ([place])  Leukocytosis, unspecified type  Weakness    Rx / DC Orders ED Discharge Orders     None         Lianne Cure, DO 42/59/56 [id number]

## 2022-08-26 NOTE — ED Notes (Signed)
Patient transported to CT 

## 2022-08-26 NOTE — ED Notes (Signed)
[name] from Home.  Endorses having a New G-Tube placed recently and began having some Discomfort to [employer]. Today Pain Worsened and the Patient has also been experiencing a Moderate Amount of Weakness. Normally Ambulatory.   History of Dementia. At Baseline per EMS. VSS with EMS En Route. NAD Noted at [place].

## 2022-08-26 NOTE — ED Notes (Signed)
Bed alarm apply for patient safety

## 2022-08-27 ENCOUNTER — Encounter (HOSPITAL_COMMUNITY): Payer: Self-pay | Admitting: Internal Medicine

## 2022-08-27 DIAGNOSIS — Y833 Surgical operation with formation of external stoma as the cause of abnormal reaction of the patient, or of later complication, without mention of misadventure at the time of the procedure: Secondary | ICD-10-CM | POA: Diagnosis not present

## 2022-08-27 DIAGNOSIS — Z86711 Personal history of pulmonary embolism: Secondary | ICD-10-CM | POA: Diagnosis not present

## 2022-08-27 DIAGNOSIS — N182 Chronic kidney disease, stage 2 (mild): Secondary | ICD-10-CM | POA: Diagnosis not present

## 2022-08-27 DIAGNOSIS — Z7984 Long term (current) use of oral hypoglycemic drugs: Secondary | ICD-10-CM | POA: Diagnosis not present

## 2022-08-27 DIAGNOSIS — E785 Hyperlipidemia, unspecified: Secondary | ICD-10-CM | POA: Diagnosis not present

## 2022-08-27 DIAGNOSIS — Z66 Do not resuscitate: Secondary | ICD-10-CM | POA: Diagnosis not present

## 2022-08-27 DIAGNOSIS — R531 Weakness: Secondary | ICD-10-CM | POA: Diagnosis present

## 2022-08-27 DIAGNOSIS — T85528A Displacement of other gastrointestinal prosthetic devices, implants and grafts, initial encounter: Secondary | ICD-10-CM | POA: Diagnosis not present

## 2022-08-27 DIAGNOSIS — G40909 Epilepsy, unspecified, not intractable, without status epilepticus: Secondary | ICD-10-CM | POA: Diagnosis not present

## 2022-08-27 DIAGNOSIS — L89312 Pressure ulcer of right buttock, stage 2: Secondary | ICD-10-CM | POA: Diagnosis not present

## 2022-08-27 DIAGNOSIS — F200 Paranoid schizophrenia: Secondary | ICD-10-CM | POA: Diagnosis not present

## 2022-08-27 DIAGNOSIS — Z87891 Personal history of nicotine dependence: Secondary | ICD-10-CM | POA: Diagnosis not present

## 2022-08-27 DIAGNOSIS — I5032 Chronic diastolic (congestive) heart failure: Secondary | ICD-10-CM | POA: Diagnosis not present

## 2022-08-27 DIAGNOSIS — E1122 Type 2 diabetes mellitus with diabetic chronic kidney disease: Secondary | ICD-10-CM | POA: Diagnosis not present

## 2022-08-27 DIAGNOSIS — Z86718 Personal history of other venous thrombosis and embolism: Secondary | ICD-10-CM | POA: Diagnosis not present

## 2022-08-27 DIAGNOSIS — Z888 Allergy status to other drugs, medicaments and biological substances status: Secondary | ICD-10-CM | POA: Diagnosis not present

## 2022-08-27 DIAGNOSIS — Z79899 Other long term (current) drug therapy: Secondary | ICD-10-CM | POA: Diagnosis not present

## 2022-08-27 DIAGNOSIS — Z993 Dependence on wheelchair: Secondary | ICD-10-CM | POA: Diagnosis not present

## 2022-08-27 DIAGNOSIS — Z7982 Long term (current) use of aspirin: Secondary | ICD-10-CM | POA: Diagnosis not present

## 2022-08-27 DIAGNOSIS — N390 Urinary tract infection, site not specified: Secondary | ICD-10-CM | POA: Diagnosis not present

## 2022-08-27 DIAGNOSIS — E119 Type 2 diabetes mellitus without complications: Secondary | ICD-10-CM | POA: Diagnosis not present

## 2022-08-27 DIAGNOSIS — N99512 Cystostomy malfunction: Secondary | ICD-10-CM | POA: Diagnosis not present

## 2022-08-27 DIAGNOSIS — F32A Depression, unspecified: Secondary | ICD-10-CM | POA: Diagnosis not present

## 2022-08-27 DIAGNOSIS — J449 Chronic obstructive pulmonary disease, unspecified: Secondary | ICD-10-CM | POA: Diagnosis not present

## 2022-08-27 DIAGNOSIS — I110 Hypertensive heart disease with heart failure: Secondary | ICD-10-CM | POA: Diagnosis not present

## 2022-08-27 DIAGNOSIS — C3490 Malignant neoplasm of unspecified part of unspecified bronchus or lung: Secondary | ICD-10-CM | POA: Diagnosis not present

## 2022-08-27 DIAGNOSIS — T85628A Displacement of other specified internal prosthetic devices, implants and grafts, initial encounter: Secondary | ICD-10-CM | POA: Diagnosis not present

## 2022-08-27 DIAGNOSIS — Z7901 Long term (current) use of anticoagulants: Secondary | ICD-10-CM | POA: Diagnosis not present

## 2022-08-27 LAB — CBG MONITORING, ED
Glucose-Capillary: 131 mg/dL — ABNORMAL HIGH (ref 70–99)
Glucose-Capillary: 132 mg/dL — ABNORMAL HIGH (ref 70–99)

## 2022-08-27 LAB — GLUCOSE, CAPILLARY: Glucose-Capillary: 99 mg/dL (ref 70–99)

## 2022-08-27 MED ORDER — PIPERACILLIN-TAZOBACTAM 3.375 G IVPB
3.3750 g | Freq: Three times a day (TID) | INTRAVENOUS | Status: DC
  Administered 2022-08-28: 3.375 g via INTRAVENOUS

## 2022-08-27 MED ORDER — SODIUM CHLORIDE 0.9 % IV SOLN: 250.0000 mg | Freq: Once | INTRAVENOUS | Status: DC

## 2022-08-27 MED ORDER — LEVETIRACETAM 250 MG PO TABS: 250.0000 mg | ORAL_TABLET | Freq: Two times a day (BID) | ORAL | Status: DC

## 2022-08-27 MED ORDER — SODIUM CHLORIDE 0.9 % IV SOLN
250.0000 mg | Freq: Two times a day (BID) | INTRAVENOUS | Status: DC
  Administered 2022-08-27 – 2022-08-28 (×2): 250 mg via INTRAVENOUS
  Filled 2022-08-27 (×3): qty 2.5

## 2022-08-27 MED ORDER — INSULIN ASPART 100 UNIT/ML IJ SOLN
0.0000 [IU] | INTRAMUSCULAR | Status: DC
  Administered 2022-08-28: 2 [IU] via SUBCUTANEOUS

## 2022-08-27 MED ORDER — LEVETIRACETAM IN NACL 500 MG/100ML IV SOLN
250.0000 mg | Freq: Once | INTRAVENOUS | Status: AC
  Administered 2022-08-27: 250 mg via INTRAVENOUS
  Filled 2022-08-27: qty 100

## 2022-08-27 MED ORDER — PIPERACILLIN-TAZOBACTAM 3.375 G IVPB 30 MIN
3.3750 g | INTRAVENOUS | Status: AC
  Administered 2022-08-27: 3.375 g via INTRAVENOUS
  Filled 2022-08-27 (×2): qty 50

## 2022-08-27 MED ORDER — LABETALOL HCL 5 MG/ML IV SOLN: 10.0000 mg | INTRAVENOUS | Status: DC | PRN

## 2022-08-27 NOTE — Progress Notes (Signed)
ANTICOAGULATION/ANTIBIOTIC CONSULT NOTE - Initial Consult  Pharmacy Consult for Heparin (when INR <2) and zosyn Indication: pulmonary embolus and intra-abd infection  Allergies  Allergen Reactions   Haloperidol Lactate Other (See Comments)    "Made him climb the walls."   Carbamazepine Other (See Comments)    Caused seizures    Sildenafil Other (See Comments)    "pain in right thigh."    Patient Measurements: Weight: 82 kg (180 lb 12.4 oz) Heparin Dosing Weight: 82 kg  Vital Signs: Temp: 98.8 F (37.1 C) (09/27 1925) Temp Source: Oral (09/27 1925) BP: 164/65 (09/27 1925) Pulse Rate: 96 (09/27 1925)  Labs: Recent Labs    08/26/22 1610 08/26/22 1820  HGB 14.1  --   HCT 44.7  --   PLT 244  --   LABPROT 29.5*  --   INR 2.8*  --   CREATININE 0.97  --   TROPONINIHS 46* 57*    Estimated Creatinine Clearance: 53.9 mL/min (by C-G formula based on SCr of 0.97 mg/dL).   Medical History: Past Medical History:  Diagnosis Date   Borderline diabetes    takes Metformin   Cancer ([place])    lung 2020 per spouse LUL   COPD (chronic obstructive pulmonary disease) ([place])    Diabetes mellitus without complication ([place])    DVT (deep venous thrombosis) ([place])    Full dentures    Hearing aid worn    B/L   HOH (hard of hearing)    Hypertension    Left upper lobe pulmonary nodule    Memory problem    Pneumonia    Schizo affective schizophrenia ([place])    Seizure ([place]) 05/21/2022   Seizures ([place])     Medications:  Medications Prior to Admission  Medication Sig Dispense Refill Last Dose   aspirin EC 81 MG tablet Take 81 mg by mouth at bedtime.   Past Week   atorvastatin (LIPITOR) 40 MG tablet Take 1 tablet (40 mg total) by mouth at bedtime.  2 Past Week   Brimonidine Tartrate (LUMIFY) 0.025 % SOLN Place 1 drop into both eyes daily as needed (dry eyes).      buPROPion (WELLBUTRIN) 100 MG tablet Take 100 mg by mouth in the morning.   08/26/2022   Calcium Carb-Cholecalciferol  (CALCIUM 500 + D PO) Take 1 tablet by mouth daily.   08/26/2022   clonazePAM (KLONOPIN) 0.5 MG tablet Take 0.25 mg by mouth in the morning and at bedtime.   08/26/2022   Emollient (CERAVE MOISTURIZING EX) Apply 1 application  topically 2 (two) times daily. Apply to bilateral toes-to-calves daily      fenofibrate 160 MG tablet Take 160 mg by mouth daily.   08/26/2022   furosemide (LASIX) 20 MG tablet Take 1 tablet (20 mg total) by mouth See admin instructions. Take 20 mg by mouth in the morning on Mon/Tues/Wed/Fri (Patient taking differently: Take 20-40 mg by mouth See admin instructions. Take 20 mg by mouth in the morning on Mon/Tues/Wed/Fri May increase dose to 40 mg twice weekly as needed for swelling) 30 tablet 6 Past Week   levETIRAcetam (KEPPRA) 500 MG tablet Take 1/2 tablet twice a day 90 tablet 3 08/26/2022   linagliptin (TRADJENTA) 5 MG TABS tablet Take 5 mg by mouth every other day.   08/26/2022   liver oil-zinc oxide (DESITIN) 40 % ointment Apply 1 Application topically as needed for irritation.   08/26/2022   metFORMIN (GLUCOPHAGE) 500 MG tablet Take 500 mg by mouth in the  morning and at bedtime.   08/26/2022   metoprolol tartrate (LOPRESSOR) 25 MG tablet Take 25 mg by mouth daily.    08/26/2022   Multiple Vitamins-Minerals (MULTIVITAMIN GUMMIES MENS) CHEW Chew 1 tablet by mouth daily.   08/26/2022   OLANZapine (ZYPREXA) 10 MG tablet Take 20 mg by mouth at bedtime.   Past Week   Omega-3 Fatty Acids (FISH OIL ULTRA) 1400 MG CAPS Take 1,400 mg by mouth daily.   08/26/2022   polyethylene glycol (MIRALAX / GLYCOLAX) 17 g packet Take 8.5 g by mouth daily.      potassium chloride SA (KLOR-CON M) 20 MEQ tablet Take 1 tablet (20 mEq total) by mouth See admin instructions. Take TWO HALVES of one 20 mEq tablet by mouth every morning to equal a total dose of 20 mEq (Patient taking differently: Take 20-40 mEq by mouth See admin instructions. Take TWO HALVES of one 20 mEq tablet by mouth every morning to equal a  total dose of 20 mEq Increase to 40 meq on the same days he takes 40 mg of Lasix) 180 tablet 1 Past Week   Probiotic Product (PROBIOTIC PO) Take 1 capsule by mouth in the morning.   08/26/2022   Sodium Chloride Flush (NORMAL SALINE FLUSH) 0.9 % SOLN Flush 3 times a day as directed 1050 mL 2 08/26/2022   triamcinolone cream (KENALOG) 0.1 % Apply 1 Application topically 2 (two) times daily.   Past Week   TYLENOL 8 HOUR ARTHRITIS PAIN 650 MG CR tablet Take 1,300 mg by mouth in the morning and at bedtime.   08/26/2022   warfarin (COUMADIN) 2 MG tablet Take 2 mg by mouth at bedtime.   Past Week   oxyCODONE (OXY IR/ROXICODONE) 5 MG immediate release tablet Take 1 tablet (5 mg total) by mouth every 6 (six) hours as needed for moderate pain or breakthrough pain. (Patient not taking: Reported on 07/31/2022) 12 tablet 0     Assessment: 88 y.o. M presents with weakness. Pt s/p G-tube placement 9/25 and having some pain. Also with recent cholecystomy and drain in place.  AC: Pt on warfarin PTA for h/o PE. Admission INR 2.8 (therapeutic). CBC ok on admission. Home dose: 2mg  qHS. Coumadin to be held in case procedure needed an heparin to start when INR <2.  ID: Zosyn for intra-abd infection  Goal of Therapy:  INR 2-3; heparin level 0.3-0.7 units/ml Monitor platelets by anticoagulation protocol: Yes   Plan:  Zosyn 3.375gm IV q8h Will f/u renal function, micro data, and pt's clinical condition Daily INR Will start heparin when INR <2  Sherlon Handing, PharmD, BCPS Please see amion for complete clinical pharmacist phone list 08/27/2022,10:27 PM

## 2022-08-27 NOTE — ED Notes (Signed)
Attempted report, nurse not available.

## 2022-08-27 NOTE — ED Notes (Signed)
Updated pts daughter [name] by phone.

## 2022-08-27 NOTE — ED Notes (Signed)
St g2 noted to patients right buttocks while performing peri/incontinence care. The stage 2 is covered with white barrier cream from home. Cleansed and placed sacral mepilex.

## 2022-08-27 NOTE — H&P (Incomplete)
History and Physical    Robert Fisher JKK:[id number] DOB: 1934-07-03 DOA: 08/26/2022  Patient coming from: Home.  History obtained from patient's daughter.  Chief Complaint: Weakness.  Abdominal pain.  HPI: Robert Fisher is a 88 y.o. male with history of diabetes mellitus type 2, seizures, prior history of DVT and PE, hypertension who was admitted in month of June for cholecystitis managed with biliary drain and cholecystostomy has been experiencing right upper quadrant pain for the last 48 hours with patient being profoundly weak.  Per patient's daughter patient did not have any nausea vomiting or diarrhea or fever chills.  ED Course: ER patient was tachycardic and CT abdomen pelvis shows displaced cholecystostomy tube.  ER physician did discuss with the on-call general surgeon who recommended IR consult for replacing the drain.  Patient was started on empiric antibiotics.  High sensitive troponin is mildly elevated and the patient denies any chest pain EKG shows nonspecific findings.  Review of Systems: As per HPI, rest all negative.   Past Medical History:  Diagnosis Date   Borderline diabetes    takes Metformin   Cancer ([place])    lung 2020 per spouse LUL   COPD (chronic obstructive pulmonary disease) ([place])    Diabetes mellitus without complication ([place])    DVT (deep venous thrombosis) ([place])    Full dentures    Hearing aid worn    B/L   HOH (hard of hearing)    Hypertension    Left upper lobe pulmonary nodule    Memory problem    Pneumonia    Schizo affective schizophrenia ([place])    Seizure ([place]) 05/21/2022   Seizures ([place])     Past Surgical History:  Procedure Laterality Date   cancer removal     legs   CATARACT EXTRACTION     COLONOSCOPY     FUDUCIAL PLACEMENT N/A 03/09/2019   Procedure: PLACEMENT OF FUDUCIAL;  Surgeon: [name], MD;  Location: [place];  Service: Thoracic;  Laterality: N/A;   IR EXCHANGE BILIARY DRAIN  07/08/2022   IR EXCHANGE  BILIARY DRAIN  08/01/2022   IR PERC CHOLECYSTOSTOMY  05/23/2022   IR RADIOLOGIST EVAL & MGMT  07/11/2022   IR REMOVAL OF CALCULI/DEBRIS BILIARY DUCT/GB  08/01/2022   THROMBECTOMY     VIDEO BRONCHOSCOPY WITH ENDOBRONCHIAL NAVIGATION N/A 03/09/2019   Procedure: VIDEO BRONCHOSCOPY WITH ENDOBRONCHIAL NAVIGATION;  Surgeon: [name], MD;  Location: [place];  Service: Thoracic;  Laterality: N/A;     reports that he quit smoking about 7 years ago. His smoking use included cigarettes and pipe. He started smoking about 69 years ago. He has a 50.00 pack-year smoking history. He has never used smokeless tobacco. He reports that he does not drink alcohol and does not use drugs.  Allergies  Allergen Reactions   Haloperidol Lactate Other (See Comments)    "Made him climb the walls."   Carbamazepine Other (See Comments)    Caused seizures    Sildenafil Other (See Comments)    "pain in right thigh."    History reviewed. No pertinent family history.  Prior to Admission medications   Medication Sig Start Date End Date Taking? Authorizing Provider  aspirin EC 81 MG tablet Take 81 mg by mouth at bedtime.   Yes [provider]  atorvastatin (LIPITOR) 40 MG tablet Take 1 tablet (40 mg total) by mouth at bedtime. 06/03/22  Yes Eugenie Filler, MD  Brimonidine Tartrate (LUMIFY) 0.025 % SOLN Place 1 drop  into both eyes daily as needed (dry eyes).   Yes [provider]  buPROPion (WELLBUTRIN) 100 MG tablet Take 100 mg by mouth in the morning.   Yes [provider]  Calcium Carb-Cholecalciferol (CALCIUM 500 + D PO) Take 1 tablet by mouth daily.   Yes [provider]  clonazePAM (KLONOPIN) 0.5 MG tablet Take 0.25 mg by mouth in the morning and at bedtime.   Yes [provider]  Emollient (CERAVE MOISTURIZING EX) Apply 1 application  topically 2 (two) times daily. Apply to bilateral toes-to-calves daily   Yes [provider]  fenofibrate 160 MG tablet  Take 160 mg by mouth daily.   Yes [provider]  furosemide (LASIX) 20 MG tablet Take 1 tablet (20 mg total) by mouth See admin instructions. Take 20 mg by mouth in the morning on Mon/Tues/Wed/Fri Patient taking differently: Take 20-40 mg by mouth See admin instructions. Take 20 mg by mouth in the morning on Mon/Tues/Wed/Fri May increase dose to 40 mg twice weekly as needed for swelling 07/23/22  Yes Lorretta Harp, MD  levETIRAcetam (KEPPRA) 500 MG tablet Take 1/2 tablet twice a day 06/20/22  Yes Cameron Sprang, MD  linagliptin (TRADJENTA) 5 MG TABS tablet Take 5 mg by mouth every other day.   Yes [provider]  liver oil-zinc oxide (DESITIN) 40 % ointment Apply 1 Application topically as needed for irritation.   Yes [provider]  metFORMIN (GLUCOPHAGE) 500 MG tablet Take 500 mg by mouth in the morning and at bedtime.   Yes [provider]  metoprolol tartrate (LOPRESSOR) 25 MG tablet Take 25 mg by mouth daily.    Yes [provider]  Multiple Vitamins-Minerals (MULTIVITAMIN GUMMIES MENS) CHEW Chew 1 tablet by mouth daily.   Yes [provider]  OLANZapine (ZYPREXA) 10 MG tablet Take 20 mg by mouth at bedtime.   Yes [provider]  Omega-3 Fatty Acids (FISH OIL ULTRA) 1400 MG CAPS Take 1,400 mg by mouth daily.   Yes [provider]  polyethylene glycol (MIRALAX / GLYCOLAX) 17 g packet Take 8.5 g by mouth daily.   Yes [provider]  potassium chloride SA (KLOR-CON M) 20 MEQ tablet Take 1 tablet (20 mEq total) by mouth See admin instructions. Take TWO HALVES of one 20 mEq tablet by mouth every morning to equal a total dose of 20 mEq Patient taking differently: Take 20-40 mEq by mouth See admin instructions. Take TWO HALVES of one 20 mEq tablet by mouth every morning to equal a total dose of 20 mEq Increase to 40 meq on the same days he takes 40 mg of Lasix 07/23/22  Yes Lorretta Harp, MD  Probiotic Product  (PROBIOTIC PO) Take 1 capsule by mouth in the morning.   Yes [provider]  Sodium Chloride Flush (NORMAL SALINE FLUSH) 0.9 % SOLN Flush 3 times a day as directed 08/11/22  Yes Suttle, Rosanne Ashing, MD  triamcinolone cream (KENALOG) 0.1 % Apply 1 Application topically 2 (two) times daily.   Yes [provider]  TYLENOL 8 HOUR ARTHRITIS PAIN 650 MG CR tablet Take 1,300 mg by mouth in the morning and at bedtime.   Yes [provider]  warfarin (COUMADIN) 2 MG tablet Take 2 mg by mouth at bedtime.   Yes [provider]  oxyCODONE (OXY IR/ROXICODONE) 5 MG immediate release tablet Take 1 tablet (5 mg total) by mouth every 6 (six) hours as needed for moderate  pain or breakthrough pain. Patient not taking: Reported on 07/31/2022 05/27/22   Eugenie Filler, MD    Physical Exam: Constitutional: Moderately built and nourished. Vitals:   08/27/22 1725 08/27/22 1819 08/27/22 1845 08/27/22 1925  BP: (!) 129/56 (!) 144/61 137/60 (!) 164/65  Pulse: 68 96 95 96  Resp: 15 19 18 18   Temp:    98.8 F (37.1 C)  TempSrc:    Oral  SpO2: 98% 94% 96% 98%   Eyes: Anicteric no pallor. ENMT: No discharge from the ears eyes nose and mouth. Neck: No mass felt.  No neck rigidity. Respiratory: No rhonchi or crepitations. Cardiovascular: S1-S2 heard. Abdomen: Soft mild tenderness in the epigastric and right upper quadrant. Musculoskeletal: No edema. Skin: Sacral ulcers. Neurologic: Alert awake oriented to his name and place moving all extremities. Psychiatric: Oriented to his name and place.   Labs on Admission: I have personally reviewed following labs and imaging studies  CBC: Recent Labs  Lab 08/26/22 1610  WBC 15.3*  NEUTROABS 12.7*  HGB 14.1  HCT 44.7  MCV 88.7  PLT 950   Basic Metabolic Panel: Recent Labs  Lab 08/26/22 1610  NA 141  K 3.8  CL 106  CO2 21*  GLUCOSE 189*  BUN 15  CREATININE 0.97  CALCIUM 9.8   GFR: CrCl cannot be calculated (Unknown  ideal weight.). Liver Function Tests: Recent Labs  Lab 08/26/22 1610  AST 23  ALT 16  ALKPHOS 48  BILITOT 0.7  PROT 7.1  ALBUMIN 4.1   No results for input(s): "LIPASE", "AMYLASE" in the last 168 hours. No results for input(s): "AMMONIA" in the last 168 hours. Coagulation Profile: Recent Labs  Lab 08/26/22 1610  INR 2.8*   Cardiac Enzymes: No results for input(s): "CKTOTAL", "CKMB", "CKMBINDEX", "TROPONINI" in the last 168 hours. BNP (last 3 results) No results for input(s): "PROBNP" in the last 8760 hours. HbA1C: No results for input(s): "HGBA1C" in the last 72 hours. CBG: Recent Labs  Lab 08/27/22 0815 08/27/22 1409  GLUCAP 131* 132*   Lipid Profile: No results for input(s): "CHOL", "HDL", "LDLCALC", "TRIG", "CHOLHDL", "LDLDIRECT" in the last 72 hours. Thyroid Function Tests: No results for input(s): "TSH", "T4TOTAL", "FREET4", "T3FREE", "THYROIDAB" in the last 72 hours. Anemia Panel: No results for input(s): "VITAMINB12", "FOLATE", "FERRITIN", "TIBC", "IRON", "RETICCTPCT" in the last 72 hours. Urine analysis:    Component Value Date/Time   COLORURINE YELLOW 08/26/2022 2318   APPEARANCEUR CLEAR 08/26/2022 2318   LABSPEC >1.046 (H) 08/26/2022 2318   LABSPEC 1.005 11/30/2008 1044   PHURINE 5.5 08/26/2022 2318   GLUCOSEU NEGATIVE 08/26/2022 2318   HGBUR NEGATIVE 08/26/2022 2318   BILIRUBINUR NEGATIVE 08/26/2022 2318   BILIRUBINUR Negative 11/30/2008 [place] (A) 08/26/2022 2318   PROTEINUR 30 (A) 08/26/2022 2318   NITRITE POSITIVE (A) 08/26/2022 2318   LEUKOCYTESUR MODERATE (A) 08/26/2022 2318   LEUKOCYTESUR Negative 11/30/2008 1044   Sepsis Labs: @LABRCNTIP (procalcitonin:4,lacticidven:4) ) Recent Results (from the past 240 hour(s))  Culture, blood (Routine x 2)     Status: None (Preliminary result)   Collection Time: 08/26/22  4:10 PM   Specimen: BLOOD  Result Value Ref Range Status   Specimen Description   Final    BLOOD BLOOD RIGHT  ARM Performed at [facility], [address] [place], [place] [address]    Special Requests   Final    BOTTLES DRAWN AEROBIC AND ANAEROBIC Blood Culture adequate volume Performed at [facility], [place]  [place], [place], [place] [address]    Culture   Final    NO GROWTH < 12 HOURS Performed at [facility], [place] [address], [place], [place] [address]    Report Status PENDING  Incomplete  Culture, blood (Routine x 2)     Status: None (Preliminary result)   Collection Time: 08/26/22  4:10 PM   Specimen: BLOOD  Result Value Ref Range Status   Specimen Description   Final    BLOOD BLOOD RIGHT HAND Performed at [facility], [address] [place], [place] [address]    Special Requests   Final    BOTTLES DRAWN AEROBIC AND ANAEROBIC Blood Culture adequate volume Performed at [facility], [address] [place], [place] [address]    Culture   Final    NO GROWTH < 12 HOURS Performed at [facility] [address] [place], [place] [address]    Report Status PENDING  Incomplete     Radiological Exams on Admission: CT ABDOMEN PELVIS W CONTRAST  Result Date: 08/26/2022 CLINICAL DATA:  Status post cystostomy and gastrostomy. Leukocytosis. Unspecified abdominal pain. EXAM: CT ABDOMEN AND PELVIS WITH CONTRAST TECHNIQUE: Multidetector CT imaging of the abdomen and pelvis was performed using the standard protocol following bolus administration of intravenous contrast. RADIATION DOSE REDUCTION: This exam was performed according to the departmental dose-optimization program which includes automated exposure control, adjustment of the mA and/or kV according to patient size and/or use of iterative reconstruction technique. CONTRAST:  67mL OMNIPAQUE IOHEXOL 300 MG/ML  SOLN COMPARISON:  05/19/2022 FINDINGS: Lower chest: The visualized lung bases are clear. Mild coronary artery calcification. Global cardiac  size within normal limits. Small hiatal hernia. Hepatobiliary: Percutaneous cholecystostomy catheter has been withdrawn from the gallbladder lumen and is now seen within the perihepatic space immediately subjacent to the abdominal wall. There is a trace amount of perihepatic fluid likely representing bile in continuity with the percutaneous drainage catheter. The gallbladder is partially decompressed. Cholelithiasis again noted. Trace pericholecystic fluid and pericholecystic inflammatory changes noted, nonspecific in the setting of recent cholecystostomy and biliary leakage. The liver is unremarkable. No intra or extrahepatic biliary ductal dilation. Pancreas: Unremarkable Spleen: Unremarkable Adrenals/Urinary Tract: Adrenal glands are unremarkable. Kidneys are normal, without renal calculi, focal lesion, or hydronephrosis. Bladder is unremarkable. Stomach/Bowel: Moderate colonic stool burden without evidence of obstruction. Stomach, small bowel, and large bowel are otherwise unremarkable. Appendix normal. No free intraperitoneal gas. Vascular/Lymphatic: Aortic atherosclerosis. No enlarged abdominal or pelvic lymph nodes. Reproductive: Mild prostatic enlargement. Seminal vesicles are unremarkable. Other: Small fat containing right inguinal hernia. No abdominopelvic ascites. Musculoskeletal: Degenerative changes are seen within the lumbar spine and hips with degenerative ankylosis of L4-S1 no acute bone abnormality. IMPRESSION: 1. Percutaneous cholecystostomy catheter has been withdrawn from the gallbladder lumen and is now seen within the perihepatic space immediately subjacent to the abdominal wall. Small perihepatic fluid likely representing bile is in continuity with the drainage catheter. 2. Cholelithiasis. Trace pericholecystic fluid and pericholecystic inflammatory changes are nonspecific in the setting of recent cholecystostomy and biliary leakage. 3. Moderate colonic stool burden without evidence of  obstruction. 4. Mild prostatic enlargement. Aortic Atherosclerosis (ICD10-I70.0). Electronically Signed   By: Fidela Salisbury M.D.   On: 08/26/2022 20:05   DG Chest Portable 1 View  Result Date: 08/26/2022 CLINICAL DATA:  Altered mental status. EXAM: PORTABLE CHEST 1 VIEW COMPARISON:  05/19/2022 FINDINGS: Stable cardiac enlargement. Unchanged blunting of the left costophrenic angle which likely reflects prominent left epicardial fat.  No signs of interstitial edema or airspace consolidation. Fiducial markers localizing to the treated left upper lobe tumor are again noted in appears similar to the previous exam. IMPRESSION: 1. Stable cardiac enlargement. 2. No acute cardiopulmonary abnormalities. Electronically Signed   By: Kerby Moors M.D.   On: 08/26/2022 16:33    EKG: Independently reviewed.  Sinus tachycardia.  Assessment/Plan Principal Problem:   Migration of percutaneous cholecystostomy tube Active Problems:   Paranoid schizophrenia in remission ([place])   PULMONARY EMBOLISM, HX OF   Essential hypertension   DM (diabetes mellitus), type 2 ([facility])   Generalized weakness   Weakness generalized    Displaced percutaneous cholecystostomy tube -for which the ER physician did discuss with on-call general surgeon who recommended IR consult for replacement.  I have consulted IR.  We will keep patient on empiric antibiotics and n.p.o. for now. Generalized weakness could be from dehydration and deconditioning.  Closely monitor.  Possible UTI. Mildly elevated troponins denies any chest pain.  Closely monitor.  Presently on heparin. History of DVT and PE on Coumadin we will hold Coumadin for now in anticipation of procedure.  Will bridge with heparin.  Stop heparin before the procedure. Possible UTI on empiric antibiotics follow cultures. Diabetes mellitus type 2 last hemoglobin A1c in June was 6.8.  We will keep patient on sliding scale coverage for now. Hypertension we will keep patient on as needed  IV labetalol for now until patient can take orally. History of seizures we will keep patient on Keppra IV until patient can take p.o. Sacral decubitus ulcer we will consult wound team.   DVT prophylaxis: Heparin infusion. Code Status: DNR confirmed with patient's daughter. Family Communication: Patient's daughter. Disposition Plan: To be determined. Consults called: IR consult.  ER physician discussed with general surgeon. Admission status: Observation.

## 2022-08-27 NOTE — ED Notes (Signed)
Patient monitor alarmed "Vtach". Review printed and given to Dr. Kathrynn Humble.

## 2022-08-27 NOTE — ED Notes (Signed)
Pt's family member requested that a CBG be taken. Pt's CBG result was 132. Informed Levada Dy - RN.

## 2022-08-28 ENCOUNTER — Observation Stay (HOSPITAL_COMMUNITY): Payer: Medicare Other

## 2022-08-28 DIAGNOSIS — E119 Type 2 diabetes mellitus without complications: Secondary | ICD-10-CM | POA: Diagnosis present

## 2022-08-28 DIAGNOSIS — N99512 Cystostomy malfunction: Secondary | ICD-10-CM | POA: Diagnosis present

## 2022-08-28 DIAGNOSIS — Z7984 Long term (current) use of oral hypoglycemic drugs: Secondary | ICD-10-CM | POA: Diagnosis not present

## 2022-08-28 DIAGNOSIS — J449 Chronic obstructive pulmonary disease, unspecified: Secondary | ICD-10-CM | POA: Diagnosis present

## 2022-08-28 DIAGNOSIS — F32A Depression, unspecified: Secondary | ICD-10-CM | POA: Diagnosis present

## 2022-08-28 DIAGNOSIS — Z7901 Long term (current) use of anticoagulants: Secondary | ICD-10-CM | POA: Diagnosis not present

## 2022-08-28 DIAGNOSIS — T85528A Displacement of other gastrointestinal prosthetic devices, implants and grafts, initial encounter: Secondary | ICD-10-CM | POA: Diagnosis not present

## 2022-08-28 DIAGNOSIS — F200 Paranoid schizophrenia: Secondary | ICD-10-CM | POA: Diagnosis present

## 2022-08-28 DIAGNOSIS — Z86718 Personal history of other venous thrombosis and embolism: Secondary | ICD-10-CM | POA: Diagnosis not present

## 2022-08-28 DIAGNOSIS — Z66 Do not resuscitate: Secondary | ICD-10-CM | POA: Diagnosis present

## 2022-08-28 DIAGNOSIS — N390 Urinary tract infection, site not specified: Secondary | ICD-10-CM | POA: Diagnosis present

## 2022-08-28 DIAGNOSIS — E785 Hyperlipidemia, unspecified: Secondary | ICD-10-CM | POA: Diagnosis present

## 2022-08-28 DIAGNOSIS — T85628A Displacement of other specified internal prosthetic devices, implants and grafts, initial encounter: Secondary | ICD-10-CM | POA: Diagnosis present

## 2022-08-28 DIAGNOSIS — I110 Hypertensive heart disease with heart failure: Secondary | ICD-10-CM | POA: Diagnosis present

## 2022-08-28 DIAGNOSIS — C3490 Malignant neoplasm of unspecified part of unspecified bronchus or lung: Secondary | ICD-10-CM | POA: Diagnosis present

## 2022-08-28 DIAGNOSIS — Z993 Dependence on wheelchair: Secondary | ICD-10-CM | POA: Diagnosis not present

## 2022-08-28 DIAGNOSIS — Z87891 Personal history of nicotine dependence: Secondary | ICD-10-CM | POA: Diagnosis not present

## 2022-08-28 DIAGNOSIS — Z7982 Long term (current) use of aspirin: Secondary | ICD-10-CM | POA: Diagnosis not present

## 2022-08-28 DIAGNOSIS — I5032 Chronic diastolic (congestive) heart failure: Secondary | ICD-10-CM | POA: Diagnosis present

## 2022-08-28 DIAGNOSIS — G40909 Epilepsy, unspecified, not intractable, without status epilepticus: Secondary | ICD-10-CM | POA: Diagnosis present

## 2022-08-28 DIAGNOSIS — R531 Weakness: Secondary | ICD-10-CM | POA: Diagnosis present

## 2022-08-28 DIAGNOSIS — Z888 Allergy status to other drugs, medicaments and biological substances status: Secondary | ICD-10-CM | POA: Diagnosis not present

## 2022-08-28 DIAGNOSIS — Z86711 Personal history of pulmonary embolism: Secondary | ICD-10-CM | POA: Diagnosis not present

## 2022-08-28 DIAGNOSIS — L89312 Pressure ulcer of right buttock, stage 2: Secondary | ICD-10-CM | POA: Diagnosis present

## 2022-08-28 DIAGNOSIS — Z79899 Other long term (current) drug therapy: Secondary | ICD-10-CM | POA: Diagnosis not present

## 2022-08-28 DIAGNOSIS — Y833 Surgical operation with formation of external stoma as the cause of abnormal reaction of the patient, or of later complication, without mention of misadventure at the time of the procedure: Secondary | ICD-10-CM | POA: Diagnosis present

## 2022-08-28 HISTORY — PX: IR EXCHANGE BILIARY DRAIN: IMG6046

## 2022-08-28 LAB — COMPREHENSIVE METABOLIC PANEL
ALT: 31 U/L (ref 0–44)
AST: 22 U/L (ref 15–41)
Albumin: 2.8 g/dL — ABNORMAL LOW (ref 3.5–5.0)
Alkaline Phosphatase: 45 U/L (ref 38–126)
Anion gap: 10 (ref 5–15)
BUN: 9 mg/dL (ref 8–23)
CO2: 23 mmol/L (ref 22–32)
Calcium: 9 mg/dL (ref 8.9–10.3)
Chloride: 109 mmol/L (ref 98–111)
Creatinine, Ser: 0.97 mg/dL (ref 0.61–1.24)
GFR, Estimated: >60 mL/min (ref >60)
Glucose, Bld: 128 mg/dL — ABNORMAL HIGH (ref 70–99)
Potassium: 3.2 mmol/L — ABNORMAL LOW (ref 3.5–5.1)
Sodium: 142 mmol/L (ref 135–145)
Total Bilirubin: 0.8 mg/dL (ref 0.3–1.2)
Total Protein: 5.9 g/dL — ABNORMAL LOW (ref 6.5–8.1)

## 2022-08-28 LAB — CBC WITH DIFFERENTIAL/PLATELET
Abs Immature Granulocytes: 0.11 10*3/uL — ABNORMAL HIGH (ref 0.00–0.07)
Basophils Absolute: 0 10*3/uL (ref 0.0–0.1)
Basophils Relative: 0 %
Eosinophils Absolute: 0.2 10*3/uL (ref 0.0–0.5)
Eosinophils Relative: 2 %
HCT: 42.4 % (ref 39.0–52.0)
Hemoglobin: 13.3 g/dL (ref 13.0–17.0)
Immature Granulocytes: 1 %
Lymphocytes Relative: 11 %
Lymphs Abs: 1.1 10*3/uL (ref 0.7–4.0)
MCH: 28.7 pg (ref 26.0–34.0)
MCHC: 31.4 g/dL (ref 30.0–36.0)
MCV: 91.4 fL (ref 80.0–100.0)
Monocytes Absolute: 0.6 10*3/uL (ref 0.1–1.0)
Monocytes Relative: 6 %
Neutro Abs: 8.5 10*3/uL — ABNORMAL HIGH (ref 1.7–7.7)
Neutrophils Relative %: 80 %
Platelets: 253 10*3/uL (ref 150–400)
RBC: 4.64 MIL/uL (ref 4.22–5.81)
RDW: 16.8 % — ABNORMAL HIGH (ref 11.5–15.5)
WBC: 10.6 10*3/uL — ABNORMAL HIGH (ref 4.0–10.5)
nRBC: 0 % (ref 0.0–0.2)

## 2022-08-28 LAB — GLUCOSE, CAPILLARY
Glucose-Capillary: 103 mg/dL — ABNORMAL HIGH (ref 70–99)
Glucose-Capillary: 127 mg/dL — ABNORMAL HIGH (ref 70–99)
Glucose-Capillary: 154 mg/dL — ABNORMAL HIGH (ref 70–99)
Glucose-Capillary: 130 mg/dL — ABNORMAL HIGH (ref 70–99)
Glucose-Capillary: 129 mg/dL — ABNORMAL HIGH (ref 70–99)

## 2022-08-28 LAB — TROPONIN I (HIGH SENSITIVITY)
Troponin I (High Sensitivity): 84 ng/L — ABNORMAL HIGH (ref <18)
Troponin I (High Sensitivity): 88 ng/L — ABNORMAL HIGH (ref <18)

## 2022-08-28 LAB — PROTIME-INR
INR: 3.4 — ABNORMAL HIGH (ref 0.8–1.2)
Prothrombin Time: 34 seconds — ABNORMAL HIGH (ref 11.4–15.2)

## 2022-08-28 MED ORDER — BUPROPION HCL 100 MG PO TABS
100.0000 mg | ORAL_TABLET | Freq: Every morning | ORAL | Status: DC
  Administered 2022-08-29: 100 mg via ORAL
  Filled 2022-08-28 (×2): qty 1

## 2022-08-28 MED ORDER — WARFARIN - PHARMACIST DOSING INPATIENT: Freq: Every day | Status: DC

## 2022-08-28 MED ORDER — IOHEXOL 300 MG/ML  SOLN
50.0000 mL | Freq: Once | INTRAMUSCULAR | Status: AC | PRN
  Administered 2022-08-28: 15 mL

## 2022-08-28 MED ORDER — ORAL CARE MOUTH RINSE
15.0000 mL | OROMUCOSAL | Status: DC
  Administered 2022-08-28 – 2022-08-29 (×8): 15 mL via OROMUCOSAL

## 2022-08-28 MED ORDER — GERHARDT'S BUTT CREAM
TOPICAL_CREAM | Freq: Two times a day (BID) | CUTANEOUS | Status: DC
  Filled 2022-08-28: qty 1

## 2022-08-28 MED ORDER — LIDOCAINE HCL 1 % IJ SOLN
INTRAMUSCULAR | Status: DC | PRN
  Administered 2022-08-28: 5 mL via INTRADERMAL

## 2022-08-28 MED ORDER — ATORVASTATIN CALCIUM 40 MG PO TABS
40.0000 mg | ORAL_TABLET | Freq: Every day | ORAL | Status: DC
  Administered 2022-08-28 – 2022-08-29 (×2): 40 mg via ORAL
  Filled 2022-08-28 (×2): qty 1

## 2022-08-28 MED ORDER — SODIUM CHLORIDE 0.9 % IV SOLN
1.0000 g | INTRAVENOUS | Status: DC
  Administered 2022-08-28 – 2022-08-29 (×2): 1 g via INTRAVENOUS
  Filled 2022-08-28 (×2): qty 10

## 2022-08-28 MED ORDER — WARFARIN SODIUM 2 MG PO TABS: 2.0000 mg | ORAL_TABLET | Freq: Every day | ORAL | Status: DC

## 2022-08-28 MED ORDER — ORAL CARE MOUTH RINSE: 15.0000 mL | OROMUCOSAL | Status: DC | PRN

## 2022-08-28 MED ORDER — METOPROLOL TARTRATE 25 MG PO TABS
25.0000 mg | ORAL_TABLET | Freq: Every day | ORAL | Status: DC
  Administered 2022-08-28 – 2022-08-29 (×2): 25 mg via ORAL
  Filled 2022-08-28 (×2): qty 1

## 2022-08-28 MED ORDER — METFORMIN HCL 500 MG PO TABS: 500.0000 mg | ORAL_TABLET | Freq: Two times a day (BID) | ORAL | Status: DC

## 2022-08-28 MED ORDER — CLONAZEPAM 0.25 MG PO TBDP
0.2500 mg | ORAL_TABLET | Freq: Two times a day (BID) | ORAL | Status: DC | PRN
  Administered 2022-08-28: 0.25 mg via ORAL
  Filled 2022-08-28: qty 1

## 2022-08-28 MED ORDER — ASPIRIN 81 MG PO TBEC
81.0000 mg | DELAYED_RELEASE_TABLET | Freq: Every day | ORAL | Status: DC
  Administered 2022-08-28 – 2022-08-29 (×2): 81 mg via ORAL
  Filled 2022-08-28 (×2): qty 1

## 2022-08-28 MED ORDER — LINAGLIPTIN 5 MG PO TABS
5.0000 mg | ORAL_TABLET | ORAL | Status: DC
  Administered 2022-08-28: 5 mg via ORAL
  Filled 2022-08-28 (×2): qty 1

## 2022-08-28 MED ORDER — LIDOCAINE HCL 1 % IJ SOLN
INTRAMUSCULAR | Status: AC
  Filled 2022-08-28: qty 20

## 2022-08-28 MED ORDER — ACETAMINOPHEN 10 MG/ML IV SOLN
1000.0000 mg | Freq: Once | INTRAVENOUS | Status: AC
  Administered 2022-08-28: 1000 mg via INTRAVENOUS
  Filled 2022-08-28: qty 100

## 2022-08-28 MED ORDER — LEVETIRACETAM 250 MG PO TABS
250.0000 mg | ORAL_TABLET | Freq: Two times a day (BID) | ORAL | Status: DC
  Administered 2022-08-28 – 2022-08-29 (×3): 250 mg via ORAL
  Filled 2022-08-28 (×4): qty 1

## 2022-08-28 MED ORDER — LEVETIRACETAM 250 MG PO TABS
250.0000 mg | ORAL_TABLET | Freq: Two times a day (BID) | ORAL | Status: DC
  Filled 2022-08-28: qty 1

## 2022-08-28 NOTE — Procedures (Signed)
Interventional Radiology Procedure Note  Procedure: Cholecystostomy tube replacement  Complications: None  Estimated Blood Loss: None  Findings: Injection of perc choly shows positioning in tract with opacification of tract to gallbladder. Tract recatheterized and new 14 Fr catheter formed in GB lumen. Good return of bile and debris. Connected to new gravity drainage bag.  Venetia Night. Kathlene Cote, M.D Pager:  [phone]

## 2022-08-28 NOTE — Care Management Obs Status (Signed)
[place] NOTIFICATION   Patient Details  Name: Robert Fisher MRN: [id number] Date of Birth: 05-13-1934   Medicare Observation Status Notification Given:  Yes    Tom-Johnson, Renea Ee, RN 08/28/2022, 3:05 PM

## 2022-08-28 NOTE — Progress Notes (Signed)
PROGRESS NOTE   Robert Fisher  ZDG:[id number] DOB: [birth year] DOA: 08/26/2022 PCP: Shon Baton, MD  Brief Narrative:  88 year old community dwelling white male-mostly wheelchair-bound occasionally walks with walker HTN DM TY 2 HLD DVT PE on Coumadin Former tobacco abuse Lung cancer NSCLC stage Ia status post bronchoscopy Dr. Roxan Hockey 03/09/2019, COPD On intermittent Lasix at home Seizure disorder paranoid schizophrenia Patient is followed chronically by palliative care at home  Admitted 6/19 through 05/27/2022 with lower extremity edema in addition to calculus cholecystitis-this was treated with cholecystotomy drain--- subsequently as an outpatient he underwent spyglass procedure and was recovering well  He started to experience right upper quadrant pain for the last 48 hours found to have dislodged cholecystotomy tube Also concerns for UTI IR was consulted with regards to exchange of the drain   Hospital-Problem based course  Dislodged cholecystotomy tube Replaced by IR 9/28-further management as per IR-poor candidate for surgery  ?  UTI Not having any symptoms although urinalysis was dirty-he is not having any abdominal pain Would cover for 3 days total antibiotics Narrowed to ceftriaxone alone follow blood cultures performed 9/26 No urine culture was performed on admit  DVT/PE from prior to admission Resuming Coumadin 2 mg daily  Diabetes mellitus type 2 Resume diet, continue Tradjenta 5 q. other day, resume metformin 500 twice daily Sliding scale shows sugars 1 20-1 50 range  NSCLC stage Ia Under surveillance-follow-up with Dr. Roxan Hockey in outpatient  Presumed HFpEF-not acute Continue metoprolol 25 daily, atorvastatin 40, aspirin 81 Hold Lasix until discharge  Depression/anxiety Resume Zyprexa 20 at bedtime, Wellbutrin 100 a.m., Klonopin 0.5 twice daily as needed  DVT prophylaxis: Coumadin Code Status: Full Family Communication: Discussed with daughter  [name] ph [phone] Disposition:  Status is: Observation The patient will require care spanning > 2 midnights and should be moved to inpatient because:   Monitor overnight   Consultants:    Procedures:   Antimicrobials: ceftriaxone    Subjective: Awake coherent no distress   Objective: Vitals:   08/28/22 0317 08/28/22 0318 08/28/22 0354 08/28/22 0355  BP:   (!) 134/50 (!) 134/50  Pulse:   94 94  Resp:    18  Temp:   98 F (36.7 C) 98 F (36.7 C)  TempSrc:   Oral Oral  SpO2:   100% 100%  Weight: 79.8 kg     Height:  5\' 8"  (1.727 m)      Intake/Output Summary (Last 24 hours) at 08/28/2022 0750 Last data filed at 08/28/2022 0358 Gross per 24 hour  Intake 1203 ml  Output 200 ml  Net 1003 ml   Filed Weights   08/27/22 2200 08/28/22 0317  Weight: 82 kg 79.8 kg    Examination:  Eomi ncat no focal deficit awake coherent Cta b no added sound S1 S2 no m/r/g Abd soft nt nd drain in place Neuro intact moving all 4 limbs   Data Reviewed: personally reviewed   CBC    Component Value Date/Time   WBC 10.6 (H) 08/28/2022 0503   RBC 4.64 08/28/2022 0503   HGB 13.3 08/28/2022 0503   HGB 17.4 (H) 12/26/2009 0927   HCT 42.4 08/28/2022 0503   HCT 51.9 (H) 12/26/2009 0927   PLT 253 08/28/2022 0503   PLT 218 12/26/2009 0927   MCV 91.4 08/28/2022 0503   MCV 91.1 12/26/2009 0927   MCH 28.7 08/28/2022 0503   MCHC 31.4 08/28/2022 0503   RDW 16.8 (H) 08/28/2022 0503   RDW 14.5 12/26/2009 6301  LYMPHSABS 1.1 08/28/2022 0503   LYMPHSABS 2.4 12/26/2009 0927   MONOABS 0.6 08/28/2022 0503   MONOABS 0.7 12/26/2009 0927   EOSABS 0.2 08/28/2022 0503   EOSABS 0.3 12/26/2009 0927   BASOSABS 0.0 08/28/2022 0503   BASOSABS 0.0 12/26/2009 0927      Latest Ref Rng & Units 08/28/2022    5:03 AM 08/26/2022    4:10 PM 08/01/2022   11:14 AM  CMP  Glucose 70 - 99 mg/dL 128  189  149   BUN 8 - 23 mg/dL 9  15  16    Creatinine 0.61 - 1.24 mg/dL 0.97  0.97  1.21   Sodium  135 - 145 mmol/L 142  141  142   Potassium 3.5 - 5.1 mmol/L 3.2  3.8  4.4   Chloride 98 - 111 mmol/L 109  106  112   CO2 22 - 32 mmol/L 23  21  21    Calcium 8.9 - 10.3 mg/dL 9.0  9.8  9.7   Total Protein 6.5 - 8.1 g/dL 5.9  7.1    Total Bilirubin 0.3 - 1.2 mg/dL 0.8  0.7    Alkaline Phos 38 - 126 U/L 45  48    AST 15 - 41 U/L 22  23    ALT 0 - 44 U/L 31  16       Radiology Studies: CT ABDOMEN PELVIS W CONTRAST  Result Date: 08/26/2022 CLINICAL DATA:  Status post cystostomy and gastrostomy. Leukocytosis. Unspecified abdominal pain. EXAM: CT ABDOMEN AND PELVIS WITH CONTRAST TECHNIQUE: Multidetector CT imaging of the abdomen and pelvis was performed using the standard protocol following bolus administration of intravenous contrast. RADIATION DOSE REDUCTION: This exam was performed according to the departmental dose-optimization program which includes automated exposure control, adjustment of the mA and/or kV according to patient size and/or use of iterative reconstruction technique. CONTRAST:  66mL OMNIPAQUE IOHEXOL 300 MG/ML  SOLN COMPARISON:  05/19/2022 FINDINGS: Lower chest: The visualized lung bases are clear. Mild coronary artery calcification. Global cardiac size within normal limits. Small hiatal hernia. Hepatobiliary: Percutaneous cholecystostomy catheter has been withdrawn from the gallbladder lumen and is now seen within the perihepatic space immediately subjacent to the abdominal wall. There is a trace amount of perihepatic fluid likely representing bile in continuity with the percutaneous drainage catheter. The gallbladder is partially decompressed. Cholelithiasis again noted. Trace pericholecystic fluid and pericholecystic inflammatory changes noted, nonspecific in the setting of recent cholecystostomy and biliary leakage. The liver is unremarkable. No intra or extrahepatic biliary ductal dilation. Pancreas: Unremarkable Spleen: Unremarkable Adrenals/Urinary Tract: Adrenal glands are  unremarkable. Kidneys are normal, without renal calculi, focal lesion, or hydronephrosis. Bladder is unremarkable. Stomach/Bowel: Moderate colonic stool burden without evidence of obstruction. Stomach, small bowel, and large bowel are otherwise unremarkable. Appendix normal. No free intraperitoneal gas. Vascular/Lymphatic: Aortic atherosclerosis. No enlarged abdominal or pelvic lymph nodes. Reproductive: Mild prostatic enlargement. Seminal vesicles are unremarkable. Other: Small fat containing right inguinal hernia. No abdominopelvic ascites. Musculoskeletal: Degenerative changes are seen within the lumbar spine and hips with degenerative ankylosis of L4-S1 no acute bone abnormality. IMPRESSION: 1. Percutaneous cholecystostomy catheter has been withdrawn from the gallbladder lumen and is now seen within the perihepatic space immediately subjacent to the abdominal wall. Small perihepatic fluid likely representing bile is in continuity with the drainage catheter. 2. Cholelithiasis. Trace pericholecystic fluid and pericholecystic inflammatory changes are nonspecific in the setting of recent cholecystostomy and biliary leakage. 3. Moderate colonic stool burden without evidence of obstruction. 4. Mild prostatic  enlargement. Aortic Atherosclerosis (ICD10-I70.0). Electronically Signed   By: Fidela Salisbury M.D.   On: 08/26/2022 20:05   DG Chest Portable 1 View  Result Date: 08/26/2022 CLINICAL DATA:  Altered mental status. EXAM: PORTABLE CHEST 1 VIEW COMPARISON:  05/19/2022 FINDINGS: Stable cardiac enlargement. Unchanged blunting of the left costophrenic angle which likely reflects prominent left epicardial fat. No signs of interstitial edema or airspace consolidation. Fiducial markers localizing to the treated left upper lobe tumor are again noted in appears similar to the previous exam. IMPRESSION: 1. Stable cardiac enlargement. 2. No acute cardiopulmonary abnormalities. Electronically Signed   By: Kerby Moors M.D.    On: 08/26/2022 16:33     Scheduled Meds:  insulin aspart  0-6 Units Subcutaneous Q4H   mouth rinse  15 mL Mouth Rinse 4 times per day   Continuous Infusions:  levETIRAcetam Stopped (08/27/22 2315)   piperacillin-tazobactam (ZOSYN)  IV 3.375 g (08/28/22 0537)     LOS: 0 days   Time spent: 49  Nita Sells, MD [facility] To contact the attending provider between 7A-7P or the covering provider during after hours 7P-7A, please log into the web site [email or web address] and access using universal [place] password for that web site. If you do not have the password, please call the hospital operator.  08/28/2022, 7:50 AM

## 2022-08-28 NOTE — Consult Note (Signed)
[facility] Nurse Consult Note: Reason for Consult: Stage 2 pressure injury to right buttocks.  Recent increase in lower extremity edema.  Moisture along with pressure causing breakdown. Is cared for at home by family.  Wound type:Stage 2 pressure injury, along with moisture associated skin damage to buttocks Pressure Injury POA: Yes Measurement: 2 cm x 1 cm x 0.1 cm nonintact denuded skin to right buttocks.  Spends a lot of his day up in chair in brief at home.  Wound bed: pink and moist Drainage (amount, consistency, odor) scant weeping Periwound:blanchable erythema Nonblanchable erythema to coccyx. Has just returned from IR for drain replacement.  Will off load pressure after patient eats his lunch.  I assist him with his dentures.    Dressing procedure/placement/frequency: Cleanse buttock wound with soap and water and pat dry. Apply gerhardts cream to open areas and reddened skin.  Encourage to turn and reposition every two hours.  Will not follow at this time.  Please re-consult if needed.  Domenic Moras MSN, RN, FNP-BC CWON Wound, Ostomy, Continence Nurse Pager [phone]

## 2022-08-28 NOTE — TOC Initial Note (Signed)
Transition of Care [facility]) - Initial/Assessment Note    Patient Details  Name: Robert Fisher MRN: [id number] Date of Birth: 09-13-1934  Transition of [facility]) CM/SW Contact:    Tom-Johnson, Renea Ee, RN Phone Number: 08/28/2022, 3:10 PM  Clinical Narrative:                  CM spoke with patient and daughter, Robert Fisher at bedside about needs for post hospital transition. Admitted for Migration of Percutaneous Cholecystostomy Tube. Had a Cholecystostomy Tube replacement today.  From home with wife. Daughter, Robert Fisher and her husband takes turns to care for patient and wife. Has a private caretaker as needed. Has a Motorized wheelchair, walker, shower seat and grab bars at home. PCP is Shon Baton, MD  and uses [facility] on [address].  Patient has Palliative care at home. On Coumadin for Hx of DVT/PE. Active with [facility] for RN, Disease Management disciplines. Daughter requesting more services as she needs help with patient's ADL's. States patient is getting weaker and needs help with caring for him.  MD notified for resumption of care orders and other Disciplines added.  CM will continue to follow as patient progresses with care towards discharge.    Expected Discharge Plan: [place] Barriers to Discharge: Continued Medical Work up   Patient Goals and CMS Choice Patient states their goals for this hospitalization and ongoing recovery are:: To return home CMS Medicare.gov Compare Post Acute Care list provided to:: Patient Choice offered to / list presented to : Patient, Adult Children (Daughter, Robert Fisher)  Expected Discharge Plan and Services Expected Discharge Plan: [place]   Discharge Planning Services: CM Consult Post Acute Care Choice: [place] arrangements for the past 2 months: Single Family Home                 DME Arranged: N/A DME Agency: NA       HH Arranged: RN, Disease Management,  PT, OT, Nurse's Aide, Social Work [employer] Agency: [place]        Prior Living Arrangements/Services Living arrangements for the past 2 months: [place] Lives with:: Spouse, Adult Children Patient language and need for interpreter reviewed:: Yes Do you feel safe going back to the place where you live?: Yes      Need for Family Participation in Patient Care: Yes (Comment) Care giver support system in place?: Yes (comment) Current home services: DME, Home RN (Motorized wheelchair, walker, shower seat and grab bars.) Criminal Activity/Legal Involvement Pertinent to Current Situation/Hospitalization: No - Comment as needed  Activities of Daily Living Home Assistive Devices/Equipment: Environmental consultant (specify type) ADL Screening (condition at time of admission) Patient's cognitive ability adequate to safely complete daily activities?: Yes Is the patient deaf or have difficulty hearing?: Yes Does the patient have difficulty seeing, even when wearing glasses/contacts?: No Does the patient have difficulty concentrating, remembering, or making decisions?: Yes Patient able to express need for assistance with ADLs?: Yes Does the patient have difficulty dressing or bathing?: Yes Independently performs ADLs?: No Does the patient have difficulty walking or climbing stairs?: Yes Weakness of Legs: Both Weakness of Arms/Hands: Both  Permission Sought/Granted Permission sought to share information with : Case Manager, Customer service manager, Family Supports Permission granted to share information with : Yes, Verbal Permission Granted              Emotional Assessment Appearance:: Appears stated age Attitude/Demeanor/Rapport: Engaged, Gracious Affect (typically  observed): Accepting, Appropriate, Calm, Hopeful, Pleasant Orientation: : Oriented to Self, Oriented to Place, Oriented to  Time, Oriented to Situation Alcohol / Substance Use: Not Applicable Psych Involvement: No  (comment)  Admission diagnosis:  Weakness [R53.1] Cystostomy malfunction (HCC) [N99.512] Generalized weakness [R53.1] Leukocytosis, unspecified type [D72.829] Weakness generalized [R53.1] Patient Active Problem List   Diagnosis Date Noted   Migration of percutaneous cholecystostomy tube 08/27/2022   Weakness generalized 08/27/2022   Generalized weakness 08/26/2022   Seizure ([place]) 05/21/2022   CKD (chronic kidney disease) stage 2, GFR 60-89 ml/min 05/21/2022   DM (diabetes mellitus), type 2 ([place]) 05/21/2022   Constipation 05/21/2022   Bilateral leg edema    Elevated INR    LFT elevation    Acute cholecystitis 05/19/2022   Essential hypertension 04/02/2022   Lower extremity edema 04/02/2022   Hallux malleus 04/01/2021   Malignant neoplasm of upper lobe of left lung ([place]) 06/25/2020   Dermatochalasis of both lower eyelids 10/06/2017   Dermatochalasis of both upper eyelids 10/06/2017   Melanoma of back ([place]) 10/18/2013   ADVEF, DRUG/MEDICINAL/BIOLOGICAL SUBST NOS 08/30/2007   HYPERLIPIDEMIA NEC/NOS 06/08/2007   Paranoid schizophrenia in remission ([place]) 03/19/2007   PERIPHERAL VASCULAR DISEASE 03/19/2007   SKIN CANCER, HX OF 03/19/2007   PULMONARY EMBOLISM, HX OF 03/19/2007   PCP:  Shon Baton, MD Pharmacy:   [facility]) [place], [place] [address] Phone: [phone] Fax: [phone]  [facility] [phone] [place], [place] [address] Phone: [phone] Fax: [place] [phone] [place], [place] [address] [place] [address] [place] [address] Phone: [phone] Fax: [phone]     Social Determinants of Health (SDOH) Interventions    Readmission Risk Interventions     No data to display

## 2022-08-28 NOTE — Progress Notes (Signed)
ANTICOAGULATION CONSULT NOTE - [facility] for Heparin (when INR <2) Indication: pulmonary embolus  Allergies  Allergen Reactions   Haloperidol Lactate Other (See Comments)    "Made him climb the walls."   Carbamazepine Other (See Comments)    Caused seizures    Sildenafil Other (See Comments)    "pain in right thigh."    Patient Measurements: Height: 5\' 8"  (172.7 cm) Weight: 79.8 kg (175 lb 14.8 oz) IBW/kg (Calculated) : 68.4 Heparin Dosing Weight: 82 kg  Vital Signs: Temp: 98 F (36.7 C) (09/28 0355) Temp Source: Oral (09/28 0355) BP: 134/50 (09/28 0355) Pulse Rate: 94 (09/28 0355)  Labs: Recent Labs    08/26/22 1610 08/26/22 1820 08/28/22 0503  HGB 14.1  --  13.3  HCT 44.7  --  42.4  PLT 244  --  253  LABPROT 29.5*  --  34.0*  INR 2.8*  --  3.4*  CREATININE 0.97  --  0.97  TROPONINIHS 46* 57* 84*     Estimated Creatinine Clearance: 53.9 mL/min (by C-G formula based on SCr of 0.97 mg/dL).   Medical History: Past Medical History:  Diagnosis Date   Borderline diabetes    takes Metformin   Cancer ([place])    lung 2020 per spouse LUL   COPD (chronic obstructive pulmonary disease) ([place])    Diabetes mellitus without complication ([place])    DVT (deep venous thrombosis) ([place])    Full dentures    Hearing aid worn    B/L   HOH (hard of hearing)    Hypertension    Left upper lobe pulmonary nodule    Memory problem    Pneumonia    Schizo affective schizophrenia ([place])    Seizure ([place]) 05/21/2022   Seizures ([place])     Medications:  Medications Prior to Admission  Medication Sig Dispense Refill Last Dose   aspirin EC 81 MG tablet Take 81 mg by mouth at bedtime.   Past Week   atorvastatin (LIPITOR) 40 MG tablet Take 1 tablet (40 mg total) by mouth at bedtime.  2 Past Week   Brimonidine Tartrate (LUMIFY) 0.025 % SOLN Place 1 drop into both eyes daily as needed (dry eyes).      buPROPion (WELLBUTRIN) 100 MG tablet Take 100 mg by mouth in  the morning.   08/26/2022   Calcium Carb-Cholecalciferol (CALCIUM 500 + D PO) Take 1 tablet by mouth daily.   08/26/2022   clonazePAM (KLONOPIN) 0.5 MG tablet Take 0.25 mg by mouth in the morning and at bedtime.   08/26/2022   Emollient (CERAVE MOISTURIZING EX) Apply 1 application  topically 2 (two) times daily. Apply to bilateral toes-to-calves daily      fenofibrate 160 MG tablet Take 160 mg by mouth daily.   08/26/2022   furosemide (LASIX) 20 MG tablet Take 1 tablet (20 mg total) by mouth See admin instructions. Take 20 mg by mouth in the morning on Mon/Tues/Wed/Fri (Patient taking differently: Take 20-40 mg by mouth See admin instructions. Take 20 mg by mouth in the morning on Mon/Tues/Wed/Fri May increase dose to 40 mg twice weekly as needed for swelling) 30 tablet 6 Past Week   levETIRAcetam (KEPPRA) 500 MG tablet Take 1/2 tablet twice a day 90 tablet 3 08/26/2022   linagliptin (TRADJENTA) 5 MG TABS tablet Take 5 mg by mouth every other day.   08/26/2022   liver oil-zinc oxide (DESITIN) 40 % ointment Apply 1 Application topically as needed for irritation.   08/26/2022  metFORMIN (GLUCOPHAGE) 500 MG tablet Take 500 mg by mouth in the morning and at bedtime.   08/26/2022   metoprolol tartrate (LOPRESSOR) 25 MG tablet Take 25 mg by mouth daily.    08/26/2022   Multiple Vitamins-Minerals (MULTIVITAMIN GUMMIES MENS) CHEW Chew 1 tablet by mouth daily.   08/26/2022   OLANZapine (ZYPREXA) 10 MG tablet Take 20 mg by mouth at bedtime.   Past Week   Omega-3 Fatty Acids (FISH OIL ULTRA) 1400 MG CAPS Take 1,400 mg by mouth daily.   08/26/2022   polyethylene glycol (MIRALAX / GLYCOLAX) 17 g packet Take 8.5 g by mouth daily.      potassium chloride SA (KLOR-CON M) 20 MEQ tablet Take 1 tablet (20 mEq total) by mouth See admin instructions. Take TWO HALVES of one 20 mEq tablet by mouth every morning to equal a total dose of 20 mEq (Patient taking differently: Take 20-40 mEq by mouth See admin instructions. Take TWO  HALVES of one 20 mEq tablet by mouth every morning to equal a total dose of 20 mEq Increase to 40 meq on the same days he takes 40 mg of Lasix) 180 tablet 1 Past Week   Probiotic Product (PROBIOTIC PO) Take 1 capsule by mouth in the morning.   08/26/2022   Sodium Chloride Flush (NORMAL SALINE FLUSH) 0.9 % SOLN Flush 3 times a day as directed 1050 mL 2 08/26/2022   triamcinolone cream (KENALOG) 0.1 % Apply 1 Application topically 2 (two) times daily.   Past Week   TYLENOL 8 HOUR ARTHRITIS PAIN 650 MG CR tablet Take 1,300 mg by mouth in the morning and at bedtime.   08/26/2022   warfarin (COUMADIN) 2 MG tablet Take 2 mg by mouth at bedtime.   Past Week   oxyCODONE (OXY IR/ROXICODONE) 5 MG immediate release tablet Take 1 tablet (5 mg total) by mouth every 6 (six) hours as needed for moderate pain or breakthrough pain. (Patient not taking: Reported on 07/31/2022) 12 tablet 0     Assessment: 88 y.o. M presents with weakness. Pt s/p G-tube placement 9/25 and having some pain. Also with recent cholecystomy and drain in place.  Pt on warfarin PTA for h/o PE. Admission INR 2.8 (therapeutic). CBC ok on admission. Home dose: 2mg  qHS. Coumadin to be held in case procedure needed and heparin to start when INR <2.   9/28: INR increased to 3.4 today. Could be attributed to new Zosyn (can inc INR) and patient is also NPO.   Goal of Therapy:  INR 2-3; heparin level 0.3-0.7 units/ml Monitor platelets by anticoagulation protocol: Yes   Plan:  No heparin as INR remains > 2 Daily INR  Dimple Nanas, PharmD, BCPS 08/28/2022 8:40 AM

## 2022-08-28 NOTE — Progress Notes (Addendum)
ANTICOAGULATION CONSULT NOTE - [place] for Heparin (when INR <2) >> warfarin Indication: pulmonary embolus  Allergies  Allergen Reactions   Haloperidol Lactate Other (See Comments)    "Made him climb the walls."   Carbamazepine Other (See Comments)    Caused seizures    Sildenafil Other (See Comments)    "pain in right thigh."    Patient Measurements: Height: 5\' 8"  (172.7 cm) Weight: 79.8 kg (175 lb 14.8 oz) IBW/kg (Calculated) : 68.4 Heparin Dosing Weight: 82 kg  Vital Signs: Temp: 97.7 F (36.5 C) (09/28 1051) Temp Source: Oral (09/28 1051) BP: 136/54 (09/28 1051) Pulse Rate: 79 (09/28 1051)  Labs: Recent Labs    08/26/22 1610 08/26/22 1820 08/28/22 0503 08/28/22 0708  HGB 14.1  --  13.3  --   HCT 44.7  --  42.4  --   PLT 244  --  253  --   LABPROT 29.5*  --  34.0*  --   INR 2.8*  --  3.4*  --   CREATININE 0.97  --  0.97  --   TROPONINIHS 46* 57* 84* 88*     Estimated Creatinine Clearance: 53.9 mL/min (by C-G formula based on SCr of 0.97 mg/dL).   Medical History: Past Medical History:  Diagnosis Date   Borderline diabetes    takes Metformin   Cancer ([place])    lung 2020 per spouse LUL   COPD (chronic obstructive pulmonary disease) ([place])    Diabetes mellitus without complication ([place])    DVT (deep venous thrombosis) ([place])    Full dentures    Hearing aid worn    B/L   HOH (hard of hearing)    Hypertension    Left upper lobe pulmonary nodule    Memory problem    Pneumonia    Schizo affective schizophrenia ([place])    Seizure ([place]) 05/21/2022   Seizures ([place])     Medications:  Medications Prior to Admission  Medication Sig Dispense Refill Last Dose   aspirin EC 81 MG tablet Take 81 mg by mouth at bedtime.   Past Week   atorvastatin (LIPITOR) 40 MG tablet Take 1 tablet (40 mg total) by mouth at bedtime.  2 Past Week   Brimonidine Tartrate (LUMIFY) 0.025 % SOLN Place 1 drop into both eyes daily as needed (dry eyes).       buPROPion (WELLBUTRIN) 100 MG tablet Take 100 mg by mouth in the morning.   08/26/2022   Calcium Carb-Cholecalciferol (CALCIUM 500 + D PO) Take 1 tablet by mouth daily.   08/26/2022   clonazePAM (KLONOPIN) 0.5 MG tablet Take 0.25 mg by mouth in the morning and at bedtime.   08/26/2022   Emollient (CERAVE MOISTURIZING EX) Apply 1 application  topically 2 (two) times daily. Apply to bilateral toes-to-calves daily      fenofibrate 160 MG tablet Take 160 mg by mouth daily.   08/26/2022   furosemide (LASIX) 20 MG tablet Take 1 tablet (20 mg total) by mouth See admin instructions. Take 20 mg by mouth in the morning on Mon/Tues/Wed/Fri (Patient taking differently: Take 20-40 mg by mouth See admin instructions. Take 20 mg by mouth in the morning on Mon/Tues/Wed/Fri May increase dose to 40 mg twice weekly as needed for swelling) 30 tablet 6 Past Week   levETIRAcetam (KEPPRA) 500 MG tablet Take 1/2 tablet twice a day 90 tablet 3 08/26/2022   linagliptin (TRADJENTA) 5 MG TABS tablet Take 5 mg by mouth every other day.  08/26/2022   liver oil-zinc oxide (DESITIN) 40 % ointment Apply 1 Application topically as needed for irritation.   08/26/2022   metFORMIN (GLUCOPHAGE) 500 MG tablet Take 500 mg by mouth in the morning and at bedtime.   08/26/2022   metoprolol tartrate (LOPRESSOR) 25 MG tablet Take 25 mg by mouth daily.    08/26/2022   Multiple Vitamins-Minerals (MULTIVITAMIN GUMMIES MENS) CHEW Chew 1 tablet by mouth daily.   08/26/2022   OLANZapine (ZYPREXA) 10 MG tablet Take 20 mg by mouth at bedtime.   Past Week   Omega-3 Fatty Acids (FISH OIL ULTRA) 1400 MG CAPS Take 1,400 mg by mouth daily.   08/26/2022   polyethylene glycol (MIRALAX / GLYCOLAX) 17 g packet Take 8.5 g by mouth daily.      potassium chloride SA (KLOR-CON M) 20 MEQ tablet Take 1 tablet (20 mEq total) by mouth See admin instructions. Take TWO HALVES of one 20 mEq tablet by mouth every morning to equal a total dose of 20 mEq (Patient taking differently:  Take 20-40 mEq by mouth See admin instructions. Take TWO HALVES of one 20 mEq tablet by mouth every morning to equal a total dose of 20 mEq Increase to 40 meq on the same days he takes 40 mg of Lasix) 180 tablet 1 Past Week   Probiotic Product (PROBIOTIC PO) Take 1 capsule by mouth in the morning.   08/26/2022   Sodium Chloride Flush (NORMAL SALINE FLUSH) 0.9 % SOLN Flush 3 times a day as directed 1050 mL 2 08/26/2022   triamcinolone cream (KENALOG) 0.1 % Apply 1 Application topically 2 (two) times daily.   Past Week   TYLENOL 8 HOUR ARTHRITIS PAIN 650 MG CR tablet Take 1,300 mg by mouth in the morning and at bedtime.   08/26/2022   warfarin (COUMADIN) 2 MG tablet Take 2 mg by mouth at bedtime.   Past Week   oxyCODONE (OXY IR/ROXICODONE) 5 MG immediate release tablet Take 1 tablet (5 mg total) by mouth every 6 (six) hours as needed for moderate pain or breakthrough pain. (Patient not taking: Reported on 07/31/2022) 12 tablet 0     Assessment: 88 y.o. M presents with weakness. Pt s/p G-tube placement 9/25 and having some pain. Also with recent cholecystomy and drain in place.  Pt on warfarin PTA for h/o PE. Admission INR 2.8 (therapeutic). CBC ok on admission. Home dose: 2mg  qHS. Coumadin to be held in case procedure needed and heparin to start when INR <2.   9/28: INR increased to 3.4 today. Could be attributed to broad-spectrum antibiotics (can inc INR) and patient has also been NPO.   Goal of Therapy:  INR 2-3; heparin level 0.3-0.7 units/ml Monitor platelets by anticoagulation protocol: Yes    PM UPDATE: - Warfarin reordered to begin this evening (9/28). Cholecystostomy tube replaced today. - INR supratherapeutic at 3.4, so will hold warfarin dose today. - Abx narrowed to ceftriaxone - Follow-up daily INR, CBC  Dimple Nanas, PharmD, BCPS 08/28/2022 2:34 PM

## 2022-08-29 ENCOUNTER — Other Ambulatory Visit: Payer: Medicare Other

## 2022-08-29 ENCOUNTER — Inpatient Hospital Stay: Admission: RE | Admit: 2022-08-29 | Payer: Medicare Other | Source: Ambulatory Visit

## 2022-08-29 DIAGNOSIS — T85528A Displacement of other gastrointestinal prosthetic devices, implants and grafts, initial encounter: Secondary | ICD-10-CM | POA: Diagnosis not present

## 2022-08-29 LAB — GLUCOSE, CAPILLARY
Glucose-Capillary: 169 mg/dL — ABNORMAL HIGH (ref 70–99)
Glucose-Capillary: 144 mg/dL — ABNORMAL HIGH (ref 70–99)
Glucose-Capillary: 143 mg/dL — ABNORMAL HIGH (ref 70–99)
Glucose-Capillary: 148 mg/dL — ABNORMAL HIGH (ref 70–99)

## 2022-08-29 LAB — COMPREHENSIVE METABOLIC PANEL
ALT: 36 U/L (ref 0–44)
AST: 31 U/L (ref 15–41)
Albumin: 2.5 g/dL — ABNORMAL LOW (ref 3.5–5.0)
Alkaline Phosphatase: 40 U/L (ref 38–126)
Anion gap: 12 (ref 5–15)
BUN: 7 mg/dL — ABNORMAL LOW (ref 8–23)
CO2: 23 mmol/L (ref 22–32)
Calcium: 9 mg/dL (ref 8.9–10.3)
Chloride: 103 mmol/L (ref 98–111)
Creatinine, Ser: 0.86 mg/dL (ref 0.61–1.24)
GFR, Estimated: >60 mL/min (ref >60)
Glucose, Bld: 136 mg/dL — ABNORMAL HIGH (ref 70–99)
Potassium: 3.1 mmol/L — ABNORMAL LOW (ref 3.5–5.1)
Sodium: 138 mmol/L (ref 135–145)
Total Bilirubin: 0.6 mg/dL (ref 0.3–1.2)
Total Protein: 5.5 g/dL — ABNORMAL LOW (ref 6.5–8.1)

## 2022-08-29 LAB — CBC
HCT: 38.8 % — ABNORMAL LOW (ref 39.0–52.0)
Hemoglobin: 12.4 g/dL — ABNORMAL LOW (ref 13.0–17.0)
MCH: 28.6 pg (ref 26.0–34.0)
MCHC: 32 g/dL (ref 30.0–36.0)
MCV: 89.4 fL (ref 80.0–100.0)
Platelets: 274 10*3/uL (ref 150–400)
RBC: 4.34 MIL/uL (ref 4.22–5.81)
RDW: 16.6 % — ABNORMAL HIGH (ref 11.5–15.5)
WBC: 8.2 10*3/uL (ref 4.0–10.5)
nRBC: 0 % (ref 0.0–0.2)

## 2022-08-29 LAB — PROTIME-INR
INR: 3.4 — ABNORMAL HIGH (ref 0.8–1.2)
Prothrombin Time: 34.3 seconds — ABNORMAL HIGH (ref 11.4–15.2)

## 2022-08-29 MED ORDER — POTASSIUM CHLORIDE CRYS ER 20 MEQ PO TBCR
40.0000 meq | EXTENDED_RELEASE_TABLET | Freq: Every day | ORAL | Status: DC
  Administered 2022-08-29: 40 meq via ORAL
  Filled 2022-08-29: qty 2

## 2022-08-29 MED ORDER — GERHARDT'S BUTT CREAM: 1.0000 | TOPICAL_CREAM | Freq: Two times a day (BID) | CUTANEOUS | 0 refills | Status: DC

## 2022-08-29 MED ORDER — INSULIN ASPART 100 UNIT/ML IJ SOLN
0.0000 [IU] | Freq: Three times a day (TID) | INTRAMUSCULAR | Status: DC
  Administered 2022-08-29: 1 [IU] via SUBCUTANEOUS

## 2022-08-29 MED ORDER — WARFARIN SODIUM 2 MG PO TABS: 2.0000 mg | ORAL_TABLET | Freq: Every day | ORAL | Status: DC

## 2022-08-29 NOTE — Discharge Summary (Signed)
Physician Discharge Summary  Robert Fisher IWP:[id number] DOB: Nov 30, 1934 DOA: 08/26/2022  PCP: Shon Baton, MD  Admit date: 08/26/2022 Discharge date: 08/29/2022  Time spent: 30 minutes  Recommendations for Outpatient Follow-up:  Requires outpatient coordination of drain study and cholangiogram with IR-appointment from 9/29 which was scheduled previously has been canceled because of replacement of the tube-I have CCed IR to ensure that they are aware of need for future appointments Needs Chem-12 CBC in about 1 week  Discharge Diagnoses:  MAIN problem for hospitalization   Displaced cholecystotomy drain UTI on admission with no cultures performed therefore empirically treated Lung cancer former tobacco abuse, seizure disorder Follows with palliative care at home  Please see below for itemized issues addressed in [place]- refer to other progress notes for clarity if needed  Discharge Condition: Improved  Diet recommendation: Heart healthy diabetic  Filed Weights   08/27/22 2200 08/28/22 0317  Weight: 82 kg 79.8 kg    History of present illness:  88 year old community dwelling white male-mostly wheelchair-bound occasionally walks with walker HTN DM TY 2 HLD DVT PE on Coumadin Former tobacco abuse Lung cancer NSCLC stage Ia status post bronchoscopy Dr. Roxan Hockey 03/09/2019, COPD On intermittent Lasix at home Seizure disorder paranoid schizophrenia Patient is followed chronically by palliative care at home   Admitted 6/19 through 05/27/2022 with lower extremity edema in addition to calculus cholecystitis-this was treated with cholecystotomy drain--- subsequently as an outpatient he underwent spyglass procedure and was recovering well   He started to experience right upper quadrant pain for the last 48 hours found to have dislodged cholecystotomy tube Also concerns for UTI IR was consulted with regards to exchange of the drain  Hospital Course:  Dislodged cholecystotomy  tube Replaced by IR 9/28-further management as per IR-poor candidate for surgery Will need outpatient drain study-CCed on call for interventional radiology will help coordinate outpatient evaluation of this   ?  UTI Not having any symptoms although urinalysis was dirty-he is not having any abdominal pain Patient was treated with ceftriaxone X 3 days blood cultures were negative  DVT/PE from prior to admission Resuming Coumadin 2 mg daily-INR was therapeutic   Diabetes mellitus type 2 Resume diet, continue Tradjenta 5 q. other day, resume metformin 500 twice daily Sliding scale shows sugars 1 20-1 50 range   NSCLC stage Ia Under surveillance-follow-up with Dr. Roxan Hockey in outpatient   Presumed HFpEF-not acute Continue metoprolol 25 daily, atorvastatin 40, aspirin 81 Hold Lasix until discharge and this was resumed   Sacral decubitus ulcer present on admission Place Gearhardt cream  Depression/anxiety Resume Zyprexa 20 at bedtime, Wellbutrin 100 a.m., Klonopin 0.5 twice daily as needed    Procedures: Replaced cholecystotomy tube and drain to gravity does not need to be flushed Consultations: IR  Discharge Exam: Vitals:   08/28/22 2052 08/29/22 0431  BP: 138/80 (!) 167/72  Pulse: 72 73  Resp: 18 18  Temp: 97.9 F (36.6 C) 98 F (36.7 C)  SpO2: 95% 94%    Subj on day of d/c   Alert coherent no distress seems happy no concerns does not have a fever chills or any other issues  General Exam on discharge  EOMI NCAT no focal deficit CTA B no added sound Abdomen obese nontender no rebound no guarding some distention Cholecystotomy tube in place Neuro intact  Discharge Instructions   Discharge Instructions     Diet - low sodium heart healthy   Complete by: As directed    Discharge instructions  Complete by: As directed    Please follow-up and call the IR clinic to figure out when they have scheduled her next appointment Continue regular diet and monitor in  the next week or so at primary care physician office Labs Make sure that you are seen and be careful with the drain--- instructions from the radiology " just keep it to drainage bag and don't have to flush it since it's large for a perc choly"   Discharge wound care:   Complete by: As directed    Cleanse buttock with soap and water pat dry apply Gearheart's cream to area   Increase activity slowly   Complete by: As directed       Allergies as of 08/29/2022       Reactions   Haloperidol Lactate Other (See Comments)   "Made him climb the walls."   Carbamazepine Other (See Comments)   Caused seizures    Sildenafil Other (See Comments)   "pain in right thigh."        Medication List     STOP taking these medications    Normal Saline Flush 0.9 % Soln   oxyCODONE 5 MG immediate release tablet Commonly known as: Oxy IR/ROXICODONE       TAKE these medications    aspirin EC 81 MG tablet Take 81 mg by mouth at bedtime.   atorvastatin 40 MG tablet Commonly known as: LIPITOR Take 1 tablet (40 mg total) by mouth at bedtime.   buPROPion 100 MG tablet Commonly known as: WELLBUTRIN Take 100 mg by mouth in the morning.   CALCIUM 500 + D PO Take 1 tablet by mouth daily.   CERAVE MOISTURIZING EX Apply 1 application  topically 2 (two) times daily. Apply to bilateral toes-to-calves daily   clonazePAM 0.5 MG tablet Commonly known as: KLONOPIN Take 0.25 mg by mouth in the morning and at bedtime.   fenofibrate 160 MG tablet Take 160 mg by mouth daily.   Fish Oil Ultra 1400 MG Caps Take 1,400 mg by mouth daily.   furosemide 20 MG tablet Commonly known as: LASIX Take 1 tablet (20 mg total) by mouth See admin instructions. Take 20 mg by mouth in the morning on Mon/Tues/Wed/Fri What changed:  how much to take additional instructions   Gerhardt's butt cream Crea Apply 1 Application topically 2 (two) times daily.   levETIRAcetam 500 MG tablet Commonly known as:  Keppra Take 1/2 tablet twice a day   linagliptin 5 MG Tabs tablet Commonly known as: TRADJENTA Take 5 mg by mouth every other day.   liver oil-zinc oxide 40 % ointment Commonly known as: DESITIN Apply 1 Application topically as needed for irritation.   Lumify 0.025 % Soln Generic drug: Brimonidine Tartrate Place 1 drop into both eyes daily as needed (dry eyes).   metFORMIN 500 MG tablet Commonly known as: GLUCOPHAGE Take 500 mg by mouth in the morning and at bedtime.   metoprolol tartrate 25 MG tablet Commonly known as: LOPRESSOR Take 25 mg by mouth daily.   Multivitamin Gummies Mens Chew Chew 1 tablet by mouth daily.   OLANZapine 10 MG tablet Commonly known as: ZYPREXA Take 20 mg by mouth at bedtime.   polyethylene glycol 17 g packet Commonly known as: MIRALAX / GLYCOLAX Take 8.5 g by mouth daily.   potassium chloride SA 20 MEQ tablet Commonly known as: KLOR-CON M Take 1 tablet (20 mEq total) by mouth See admin instructions. Take TWO HALVES of one 20 mEq tablet by  mouth every morning to equal a total dose of 20 mEq What changed:  how much to take additional instructions   PROBIOTIC PO Take 1 capsule by mouth in the morning.   triamcinolone cream 0.1 % Commonly known as: KENALOG Apply 1 Application topically 2 (two) times daily.   Tylenol 8 Hour Arthritis Pain 650 MG CR tablet Generic drug: acetaminophen Take 1,300 mg by mouth in the morning and at bedtime.   warfarin 2 MG tablet Commonly known as: COUMADIN Take 1 tablet (2 mg total) by mouth at bedtime. Begin taking 9/30 (hold dose 9/29) What changed: additional instructions               Discharge Care Instructions  (From admission, onward)           Start     Ordered   08/29/22 0000  Discharge wound care:       Comments: Cleanse buttock with soap and water pat dry apply Gearheart's cream to area   08/29/22 0914           Allergies  Allergen Reactions   Haloperidol Lactate Other  (See Comments)    "Made him climb the walls."   Carbamazepine Other (See Comments)    Caused seizures    Sildenafil Other (See Comments)    "pain in right thigh."    Follow-up Information     [facility] Follow up.   Specialty: Home Health Services Why: Someone will call you for resumption of care. Contact information: [address] [place] [address] [phone]                  The results of significant diagnostics from this hospitalization (including imaging, microbiology, ancillary and laboratory) are listed below for reference.    Significant Diagnostic Studies: IR EXCHANGE BILIARY DRAIN  Result Date: 08/28/2022 INDICATION: Status post percutaneous cholecystostomy tube placement on 05/23/2022. This has withdrawn inadvertently just outside the gallbladder lumen by CT and requires replacement. EXAM: PERCUTANEOUS CHOLECYSTOSTOMY TUBE EXCHANGE UNDER FLUOROSCOPY MEDICATIONS: None ANESTHESIA/SEDATION: None FLUOROSCOPY: Radiation Exposure Index (as provided by the fluoroscopic device): 1 minute and 24 seconds. 48 mGy. CONTRAST:  15 mL Omnipaque 809 COMPLICATIONS: None immediate. PROCEDURE: Informed written consent was obtained from the patient after a thorough discussion of the procedural risks, benefits and alternatives. All questions were addressed. Maximal Sterile Barrier Technique was utilized including caps, mask, sterile gowns, sterile gloves, sterile drape, hand hygiene and skin antiseptic. A timeout was performed prior to the initiation of the procedure. The pre-existing cholecystostomy tube was injected with contrast material under fluoroscopy. The catheter was cut and removed over a guidewire. A 5 French catheter was introduced into the tract and further advanced under fluoroscopy into the gallbladder lumen. Contrast was injected into the gallbladder. A new 14 French drainage catheter was then advanced and formed in the gallbladder lumen. The catheter  was injected with contrast to confirm position, flushed with saline and connected to a new gravity drainage bag. It was secured at the skin exit site with a Prolene retention suture and adhesive retention device. FINDINGS: The pre-existing cholecystostomy tube was located outside of the gallbladder lumen. With catheter injection, there is reflux of contrast into the gallbladder. After catheterizing the gallbladder, a new 79 French cholecystostomy tube was able to be advanced and formed in the gallbladder lumen. There is return of bile as well as some biliary debris. IMPRESSION: Replacement of malpositioned cholecystostomy tube with new 14 French catheter which was  able to be readvanced through the chronic tract and into the gallbladder lumen. This new catheter was connected to a gravity drainage bag and is now adequately draining bile with some biliary debris. Electronically Signed   By: Aletta Edouard M.D.   On: 08/28/2022 11:42   CT ABDOMEN PELVIS W CONTRAST  Result Date: 08/26/2022 CLINICAL DATA:  Status post cystostomy and gastrostomy. Leukocytosis. Unspecified abdominal pain. EXAM: CT ABDOMEN AND PELVIS WITH CONTRAST TECHNIQUE: Multidetector CT imaging of the abdomen and pelvis was performed using the standard protocol following bolus administration of intravenous contrast. RADIATION DOSE REDUCTION: This exam was performed according to the departmental dose-optimization program which includes automated exposure control, adjustment of the mA and/or kV according to patient size and/or use of iterative reconstruction technique. CONTRAST:  21mL OMNIPAQUE IOHEXOL 300 MG/ML  SOLN COMPARISON:  05/19/2022 FINDINGS: Lower chest: The visualized lung bases are clear. Mild coronary artery calcification. Global cardiac size within normal limits. Small hiatal hernia. Hepatobiliary: Percutaneous cholecystostomy catheter has been withdrawn from the gallbladder lumen and is now seen within the perihepatic space immediately  subjacent to the abdominal wall. There is a trace amount of perihepatic fluid likely representing bile in continuity with the percutaneous drainage catheter. The gallbladder is partially decompressed. Cholelithiasis again noted. Trace pericholecystic fluid and pericholecystic inflammatory changes noted, nonspecific in the setting of recent cholecystostomy and biliary leakage. The liver is unremarkable. No intra or extrahepatic biliary ductal dilation. Pancreas: Unremarkable Spleen: Unremarkable Adrenals/Urinary Tract: Adrenal glands are unremarkable. Kidneys are normal, without renal calculi, focal lesion, or hydronephrosis. Bladder is unremarkable. Stomach/Bowel: Moderate colonic stool burden without evidence of obstruction. Stomach, small bowel, and large bowel are otherwise unremarkable. Appendix normal. No free intraperitoneal gas. Vascular/Lymphatic: Aortic atherosclerosis. No enlarged abdominal or pelvic lymph nodes. Reproductive: Mild prostatic enlargement. Seminal vesicles are unremarkable. Other: Small fat containing right inguinal hernia. No abdominopelvic ascites. Musculoskeletal: Degenerative changes are seen within the lumbar spine and hips with degenerative ankylosis of L4-S1 no acute bone abnormality. IMPRESSION: 1. Percutaneous cholecystostomy catheter has been withdrawn from the gallbladder lumen and is now seen within the perihepatic space immediately subjacent to the abdominal wall. Small perihepatic fluid likely representing bile is in continuity with the drainage catheter. 2. Cholelithiasis. Trace pericholecystic fluid and pericholecystic inflammatory changes are nonspecific in the setting of recent cholecystostomy and biliary leakage. 3. Moderate colonic stool burden without evidence of obstruction. 4. Mild prostatic enlargement. Aortic Atherosclerosis (ICD10-I70.0). Electronically Signed   By: Fidela Salisbury M.D.   On: 08/26/2022 20:05   DG Chest Portable 1 View  Result Date:  08/26/2022 CLINICAL DATA:  Altered mental status. EXAM: PORTABLE CHEST 1 VIEW COMPARISON:  05/19/2022 FINDINGS: Stable cardiac enlargement. Unchanged blunting of the left costophrenic angle which likely reflects prominent left epicardial fat. No signs of interstitial edema or airspace consolidation. Fiducial markers localizing to the treated left upper lobe tumor are again noted in appears similar to the previous exam. IMPRESSION: 1. Stable cardiac enlargement. 2. No acute cardiopulmonary abnormalities. Electronically Signed   By: Kerby Moors M.D.   On: 08/26/2022 16:33   IR REMOVAL OF CALCULI/DEBRIS BILIARY DUCT/GB  Result Date: 08/01/2022 INDICATION: 88 year old male with history of acute calculus cholecystitis status post percutaneous cholecystostomy tube placement on 05/23/2022. The patient has been deemed a poor surgical candidate for cholecystectomy, therefore presents for fluoroscopic and choledochoscopic guided percutaneous gallstone retrieval. EXAM: 1. Percutaneous gallstone retrieval 2. Cholecystostomy tube exchange MEDICATIONS: 2 g cefoxitin, intravenous; The antibiotic was administered within an appropriate time frame  prior to the initiation of the procedure. ANESTHESIA/SEDATION: Moderate (conscious) sedation was employed during this procedure. A total of Versed 4 mg and Fentanyl 175 mcg was administered intravenously. Moderate Sedation Time: 71 minutes. The patient's level of consciousness and vital signs were monitored continuously by radiology nursing throughout the procedure under my direct supervision. FLUOROSCOPY TIME:  Two hundred forty-three mGy COMPLICATIONS: None immediate. PROCEDURE: Informed written consent was obtained from the patient after a thorough discussion of the procedural risks, benefits and alternatives. All questions were addressed. Maximal Sterile Barrier Technique was utilized including caps, mask, sterile gowns, sterile gloves, sterile drape, hand hygiene and skin  antiseptic. A timeout was performed prior to the initiation of the procedure. The right upper quadrant indwelling cholecystostomy tube ir prepped and draped in standard fashion. Preprocedure scout image demonstrated unchanged position of cholecystostomy tube in the right upper quadrant. Gentle hand injection demonstrated persistent bilobed appearance of the gallbladder with at least a 1.2 cm filling defect in the lateral, fundal region of the gallbladder. The cystic and common bile ducts are widely patent without evidence of choledocholithiasis. Subdermal Local anesthesia was provided at the tube entry site with 1% lidocaine. Additional deeper local anesthetic was administered along the indwelling tube tract. The external portion of the indwelling drain was cut to release inter pigtail and an Amplatz wire was inserted and coiled within the gallbladder. The indwelling drain was removed and after serial dilation exchanged for a 12 French, 45 cm sheath. Attempts were made at cannulating the cystic duct which were unsuccessful with multiple catheter and wire combinations. Therefore, [address] [place] choledochoscope was inserted and revealed at least 2 subcentimeter gallstones within the gallbladder lumen. Twelve French sheath was upsized after serial dilation to a 20 [place] peel-away sheath. Multiple sweeps with gallstone retrieval basket were made with intermittent irrigation and choledochoscopic visualization of the gallbladder lumen. There were 2 gallstones removed which measured approximately 0.5 cm in diameter. Final choledochoscopic visualization demonstrated no evidence persistent cholelithiasis. The sheath was removed over an Amplatz wire and exchanged for a 14 [place] multipurpose drainage catheter which was coiled in the medial most aspect of the gallbladder. Pigtail portion was locked and drain was affixed to the skin with an interrupted 0 silk suture. Final cholecystogram  demonstrated no evidence of residual cholelithiasis. A sterile bandage was applied. The patient tolerated the procedure well was transferred to the recovery area in good condition. IMPRESSION: 1. Technically successful percutaneous gallstone retrieval yielding 2, 0.5 cm gallstones. 2. Technically successful cholecystostomy tube exchange in upsized to 14 [place], placed to bag drainage. PLAN: Return in approximately 4 weeks for cholecystostomy tube drain injection. If the cystic duct and common bile duct patent without choledocholithiasis, capping trial be initiated. Ruthann Cancer, MD [facility] Electronically Signed   By: Ruthann Cancer M.D.   On: 08/01/2022 16:39    Microbiology: Recent Results (from the past 240 hour(s))  Culture, blood (Routine x 2)     Status: None (Preliminary result)   Collection Time: 08/26/22  4:10 PM   Specimen: BLOOD  Result Value Ref Range Status   Specimen Description   Final    BLOOD BLOOD RIGHT ARM Performed at [facility], [address] [place], [place] [address]    Special Requests   Final    BOTTLES DRAWN AEROBIC AND ANAEROBIC Blood Culture adequate volume Performed at [facility], [address] [place], [place] [address]    Culture  Final    NO GROWTH 3 DAYS Performed at [facility], [place] [address] [place], [place] [address]    Report Status PENDING  Incomplete  Culture, blood (Routine x 2)     Status: None (Preliminary result)   Collection Time: 08/26/22  4:10 PM   Specimen: BLOOD  Result Value Ref Range Status   Specimen Description   Final    BLOOD BLOOD RIGHT HAND Performed at [facility], [address] [place], [place] [address]    Special Requests   Final    BOTTLES DRAWN AEROBIC AND ANAEROBIC Blood Culture adequate volume Performed at [facility], [address], [place], [place] [address]     Culture   Final    NO GROWTH 3 DAYS Performed at [facility], [place] [address], [place], [place] [address]    Report Status PENDING  Incomplete     Labs: Basic Metabolic Panel: Recent Labs  Lab 08/26/22 1610 08/28/22 0503 08/29/22 0333  NA 141 142 138  K 3.8 3.2* 3.1*  CL 106 109 103  CO2 21* 23 23  GLUCOSE 189* 128* 136*  BUN 15 9 7*  CREATININE 0.97 0.97 0.86  CALCIUM 9.8 9.0 9.0   Liver Function Tests: Recent Labs  Lab 08/26/22 1610 08/28/22 0503 08/29/22 0333  AST 23 22 31   ALT 16 31 36  ALKPHOS 48 45 40  BILITOT 0.7 0.8 0.6  PROT 7.1 5.9* 5.5*  ALBUMIN 4.1 2.8* 2.5*   No results for input(s): "LIPASE", "AMYLASE" in the last 168 hours. No results for input(s): "AMMONIA" in the last 168 hours. CBC: Recent Labs  Lab 08/26/22 1610 08/28/22 0503 08/29/22 0333  WBC 15.3* 10.6* 8.2  NEUTROABS 12.7* 8.5*  --   HGB 14.1 13.3 12.4*  HCT 44.7 42.4 38.8*  MCV 88.7 91.4 89.4  PLT 244 253 274   Cardiac Enzymes: No results for input(s): "CKTOTAL", "CKMB", "CKMBINDEX", "TROPONINI" in the last 168 hours. BNP: BNP (last 3 results) Recent Labs    03/28/22 1906 05/19/22 1343  BNP 201.0* 134.3*    ProBNP (last 3 results) No results for input(s): "PROBNP" in the last 8760 hours.  CBG: Recent Labs  Lab 08/28/22 0720 08/28/22 1138 08/28/22 1703 08/28/22 2054 08/29/22 0731  GLUCAP 127* 154* 130* 129* 169*       Signed:  Nita Sells MD   [facility] 08/29/2022, 9:31 AM

## 2022-08-29 NOTE — Progress Notes (Signed)
ANTICOAGULATION CONSULT NOTE - [place] for warfarn Indication: pulmonary embolus  Allergies  Allergen Reactions   Haloperidol Lactate Other (See Comments)    "Made him climb the walls."   Carbamazepine Other (See Comments)    Caused seizures    Sildenafil Other (See Comments)    "pain in right thigh."    Patient Measurements: Height: 5\' 8"  (172.7 cm) Weight: 79.8 kg (175 lb 14.8 oz) IBW/kg (Calculated) : 68.4 Heparin Dosing Weight: 82 kg  Vital Signs: Temp: 98 F (36.7 C) (09/29 0431) Temp Source: Oral (09/29 0431) BP: 167/72 (09/29 0431) Pulse Rate: 73 (09/29 0431)  Labs: Recent Labs    08/26/22 1610 08/26/22 1820 08/28/22 0503 08/28/22 0708 08/29/22 0333  HGB 14.1  --  13.3  --  12.4*  HCT 44.7  --  42.4  --  38.8*  PLT 244  --  253  --  274  LABPROT 29.5*  --  34.0*  --  34.3*  INR 2.8*  --  3.4*  --  3.4*  CREATININE 0.97  --  0.97  --  0.86  TROPONINIHS 46* 57* 84* 88*  --      Estimated Creatinine Clearance: 60.8 mL/min (by C-G formula based on SCr of 0.86 mg/dL).   Assessment: 88 y.o. M presents with weakness. Pt s/p G-tube placement 9/25 and having some pain. Also with recent cholecystomy and drain in place.  PTA warfarin resumed  INR still elevated this AM at 3.4  Goal of Therapy:  INR 2-3; heparin level 0.3-0.7 units/ml Monitor platelets by anticoagulation protocol: Yes   Plan: No warfarin today Daily INR  Thank you Anette Guarneri, PharmD  08/29/2022 7:53 AM

## 2022-08-29 NOTE — TOC Transition Note (Addendum)
Transition of Care [facility]) - CM/SW Discharge Note   Patient Details  Name: Robert Fisher MRN: [id number] Date of Birth: February 15, 1934  [facility] CM/SW Contact:  Tom-Johnson, Renea Ee, RN Phone Number: 08/29/2022, 10:56 AM   Clinical Narrative:     Patient is scheduled for discharge today. Home health referral with [facility], info on AVS. Daughter requested Hospice services at home. Patient is active with [facility] for Palliative at home. MD and [facility] notified. Referral order placed. Daughter to transport at discharge. No further TOC needs noted.   16:10- Daughter requests [facility] as the car she has is too low to transport patient.  [facility] scheduled for discharge.No further TOC needs noted.     Final next level of care: [place] Barriers to Discharge: Barriers Resolved   Patient Goals and CMS Choice Patient states their goals for this hospitalization and ongoing recovery are:: To rerturn home CMS Medicare.gov Compare Post Acute Care list provided to:: Patient Choice offered to / list presented to : Patient, Adult Children (Daughter, Robert Fisher)  Discharge Placement                Patient to be transferred to facility by: Daughter.      Discharge Plan and Services   Discharge Planning Services: CM Consult Post Acute Care Choice: Home Health          DME Arranged: N/A DME Agency: NA       HH Arranged: RN, Disease Management, PT, OT, Nurse's Aide, Social Work [employer] Agency: [facility]        Social Determinants of Health (SDOH) Interventions     Readmission Risk Interventions     No data to display

## 2022-08-29 NOTE — Progress Notes (Addendum)
Manufacturing engineer [facility]) Hospital Liaison Note   Received request from Transitions of Care Manager, Hassan Rowan, to contact family to explain hospice services (home vs Hospice Home). MSW spoke with daughter/Meredith via phone call and provided extensive education for hospice services.  Family requested additional time to discuss options with patient before making a final decision. MSW voiced understanding. All questions answered and no concerns voiced.  At this time, patient is not under review for [facility] services as family continues to have ongoing [place] discussions.   [facility] information and contact numbers given to family & above information shared with TOC.   Addendum 4:40 pm- Meredith contacted MSW reporting that family is receptive to hospice services. Family declines any DME needs & reports that hospital staff are arranging ambulance transport.  MSW notified appropriate [facility] staff to initiate hospice services.  Please call with any questions/concerns.    Thank you for the opportunity to participate in this patient's care.   Daphene Calamity, MSW [facility] Liaison

## 2022-08-30 NOTE — Progress Notes (Signed)
DISCHARGE NOTE HOME Keino Placencia to be discharged home per MD order. Discussed prescriptions and follow up appointments with the daughter [name]. Prescriptions given to daughter medication list explained in detail. Daughter verbalized understanding.  Skin clean, dry and intact without evidence of skin break down, no evidence of skin tears noted. IV catheter discontinued intact. Site without signs and symptoms of complications. Dressing and pressure applied. Pt denies pain at the site currently. No complaints noted.  Patient free of lines, drains, and wounds.  An After Visit Summary (AVS) was printed and given to the daughter [name] Patient escorted via stretcher, and discharged home via [place].[name] was called to be informed father was on his way home,discussed pt night meds that was given at bed time.  Rockie Neighbours RN

## 2022-08-31 LAB — CULTURE, BLOOD (ROUTINE X 2)
Culture: NO GROWTH
Culture: NO GROWTH
Special Requests: ADEQUATE
Special Requests: ADEQUATE

## 2022-09-02 ENCOUNTER — Other Ambulatory Visit: Payer: Self-pay | Admitting: Interventional Radiology

## 2022-09-02 DIAGNOSIS — K8000 Calculus of gallbladder with acute cholecystitis without obstruction: Secondary | ICD-10-CM

## 2022-09-03 ENCOUNTER — Ambulatory Visit (HOSPITAL_COMMUNITY)
Admission: RE | Admit: 2022-09-03 | Discharge: 2022-09-03 | Disposition: A | Discharge status: Home / Self Care | Payer: Medicare Other | Source: Ambulatory Visit | Attending: Interventional Radiology | Admitting: Interventional Radiology

## 2022-09-03 ENCOUNTER — Other Ambulatory Visit: Payer: Self-pay | Admitting: Interventional Radiology

## 2022-09-03 DIAGNOSIS — K8000 Calculus of gallbladder with acute cholecystitis without obstruction: Secondary | ICD-10-CM

## 2022-09-03 DIAGNOSIS — Z434 Encounter for attention to other artificial openings of digestive tract: Secondary | ICD-10-CM | POA: Insufficient documentation

## 2022-09-03 HISTORY — PX: IR EXCHANGE BILIARY DRAIN: IMG6046

## 2022-09-03 MED ORDER — LIDOCAINE HCL 1 % IJ SOLN
INTRAMUSCULAR | Status: AC
  Administered 2022-09-03: 10 mL
  Filled 2022-09-03: qty 20

## 2022-09-03 MED ORDER — IOHEXOL 300 MG/ML  SOLN
100.0000 mL | Freq: Once | INTRAMUSCULAR | Status: AC | PRN
  Administered 2022-09-03: 20 mL

## 2022-09-03 NOTE — Procedures (Signed)
Interventional Radiology Procedure Note  Procedure:   Exchange of perc chole tube, with complaint of "draining at the skin site".    New 25F pigtail drain.    Findings: The old drain, 21F, had been partially unfurled and withdrawn, such that the sideholes were outside the GB and likely contributing to the complaint.   New 25F drain seems to curl better in the small sized GB.    Complications: None Recommendations:  - To gravity - usual drain care - Do not submerge    Signed,  Dulcy Fanny. Earleen Newport, DO

## 2022-09-05 ENCOUNTER — Telehealth: Payer: Self-pay | Admitting: Diagnostic Radiology

## 2022-09-05 NOTE — Progress Notes (Signed)
Patient ID: Robert Fisher, male   DOB: [birth year], 88 y.o.   MRN: [id number] Patient had cholecystostomy tube exchanged on 09/03/22.  Patient's daughter called and she was concerned that her father's cholecystostomy was dislodged.  Patient tried to exercise today and concerned that the tube was pulled on. Daughter sent me a photo of the drain and the drain suture is intact.  She reports that the tube is still draining and the patient is not complaining of pain.  I believe the tube is fine and does not require additional evaluation at this time.  She will contact us if the patient develops any symptoms or if the tube stops draining.

## 2022-09-08 ENCOUNTER — Telehealth: Payer: Self-pay | Admitting: Cardiovascular Disease

## 2022-09-08 NOTE — Telephone Encounter (Signed)
Spoke to patient's daughter Robert Fisher she wanted to know since father has been in hospital recently if he can still take a extra Lasix 20 mg.Stated he has increased swelling in both feet.Advised he can take a extra 20 mg twice a week if needed.Appointment scheduled with Almyra Deforest PA 10/19 at 2:245 pm.She wanted to know if that can be changed to a phone visit.Advised I will send message to [facility] for advice.

## 2022-09-08 NOTE — Telephone Encounter (Signed)
Pt c/o medication issue:  1. Name of Medication:   furosemide (LASIX) 20 MG tablet    potassium chloride SA (KLOR-CON M) 20 MEQ tablet    2. How are you currently taking this medication (dosage and times per day)?   3. Are you having a reaction (difficulty breathing--STAT)? No  4. What is your medication issue? Pt's daughter would like to know if pt should still  take above medications as they were told. Reason being, pt was just discharged from hospital and woke up this morning with feet swollen. Please advise

## 2022-09-09 ENCOUNTER — Other Ambulatory Visit: Payer: Medicare Other | Admitting: *Deleted

## 2022-09-09 DIAGNOSIS — Z515 Encounter for palliative care: Secondary | ICD-10-CM

## 2022-09-09 NOTE — Progress Notes (Addendum)
   [facility] RN NOTE  PATIENT NAME: Robert Fisher DOB: January 15, 1934 MRN: [id number]  PRIMARY CARE PROVIDER: Shon Baton, MD  RESPONSIBLE PARTY: [name] (daughter) Acct ID - Guarantor Home Phone Work Phone Relationship Acct Type  [id number] [name], [name]  Self P/F     [place], [place], [place] [id number]    RN visit made as requested by Dr. Keane Police office to check patient's INR. INR 2.0, PT 21.6. Patient currently taking Coumadin 2 mg po daily. Called and left a voicemail with Caryl Pina, Dr. Keane Police nurse to report results. Left my contact information for return call. Awaiting call back.  UPDATE: Received call back from [facility]. Patient is to continue current Coumadin dose and they request INR be re-checked in 3 weeks. Communication sent to daughter with this information.   Daryl Eastern, RN BSN

## 2022-09-10 ENCOUNTER — Ambulatory Visit
Admission: RE | Admit: 2022-09-10 | Discharge: 2022-09-10 | Disposition: A | Discharge status: Home / Self Care | Payer: Medicare Other | Source: Ambulatory Visit | Attending: Interventional Radiology | Admitting: Interventional Radiology

## 2022-09-10 ENCOUNTER — Other Ambulatory Visit (HOSPITAL_COMMUNITY): Payer: Self-pay | Admitting: Interventional Radiology

## 2022-09-10 ENCOUNTER — Encounter: Payer: Self-pay | Admitting: Radiology

## 2022-09-10 DIAGNOSIS — K8000 Calculus of gallbladder with acute cholecystitis without obstruction: Secondary | ICD-10-CM

## 2022-09-10 HISTORY — PX: IR RADIOLOGIST EVAL & MGMT: IMG5224

## 2022-09-15 ENCOUNTER — Telehealth: Payer: Self-pay | Admitting: Cardiovascular Disease

## 2022-09-15 NOTE — Telephone Encounter (Signed)
Daughter stated patient has a gall bladder drainage tube and does not want to risk it coming out again. She requested a phone visit on 10/19. Informed her of how to prepare for the phone visit. Patient has not been taking extra dose of lasix for swelling due to patient getting tired when going to the toilet. Recommended for patient to use his urinal. Denies sob.

## 2022-09-15 NOTE — Telephone Encounter (Signed)
Calling to see if appt on Thursday can be switch to virtual. Please advise

## 2022-09-17 NOTE — Telephone Encounter (Signed)
Spoke with terrah, changed to virtual

## 2022-09-18 ENCOUNTER — Ambulatory Visit: Payer: Medicare Other | Attending: Physician Assistant | Admitting: Physician Assistant

## 2022-09-18 ENCOUNTER — Telehealth: Payer: Self-pay

## 2022-09-18 VITALS — Ht 68.0 in

## 2022-09-18 DIAGNOSIS — I825Y9 Chronic embolism and thrombosis of unspecified deep veins of unspecified proximal lower extremity: Secondary | ICD-10-CM | POA: Diagnosis not present

## 2022-09-18 DIAGNOSIS — K810 Acute cholecystitis: Secondary | ICD-10-CM | POA: Diagnosis not present

## 2022-09-18 DIAGNOSIS — I10 Essential (primary) hypertension: Secondary | ICD-10-CM

## 2022-09-18 DIAGNOSIS — R600 Localized edema: Secondary | ICD-10-CM

## 2022-09-18 NOTE — Progress Notes (Signed)
Virtual Visit via Telephone Note   Because of Robert Fisher co-morbid illnesses, he is at least at moderate risk for complications without adequate follow up.  This format is felt to be most appropriate for this patient at this time.  The patient did not have access to video technology/had technical difficulties with video requiring transitioning to audio format only (telephone).  All issues noted in this document were discussed and addressed.  No physical exam could be performed with this format.  Please refer to the patient's chart for his consent to telehealth for [facility].    Date:  09/20/2022   ID:  Robert Fisher, DOB Oct 08, 1934, MRN [id number] The patient was identified using 2 identifiers.  Patient Location: Home Provider Location: Office/Clinic   PCP:  Robert Fisher, [place] Providers Cardiologist:  Robert Burow, MD     Evaluation Performed:  Follow-Up Visit  Chief Complaint:  leg edema  History of Present Illness:    Robert Fisher is a 88 y.o. male with a hx of HTN, HLD, DM2, paranoid schizophrenia, seizure disorder, former tobacco abuse, COPD, lung cancer, history of DVT/PE on warfarin.  Patient has never had a heart attack or stroke.  He is mostly wheelchair-bound but walks occasionally with a walker. Patient was recently seen in the ED earlier this year for congestive heart failure and was referred to cardiology service.  Echocardiogram obtained in May 2023 showed normal EF, grade 2 DD, normal pulm artery systolic pressure.  He was last seen by Dr. Gwenlyn Fisher on 04/19/2022 at which time he only had 1+ ankle edema.  He is on Lasix 4 times a week.  Since last visit, patient was seen in the ED on 04/03/2022 after a fall.  Patient was also admitted on 05/19/2022 with progressively worsening lower extremity edema despite taking home diuretic.  Patient also had intermittent right upper quadrant abdominal pain.  Work-up revealed acute  cholecystitis seen on CT scan and HIDA scan, elevated liver enzyme, normal total bilirubin.  INR was supratherapeutic at 5.5.  Patient was started on empiric IV antibiotic.  He was seen by general surgery who recommended percutaneous cholecystostomy tube to be performed by IR.   I last saw the patient on 07/14/2022 at which time the patient was accompanied by daughter.  She was quite exhausted with caring for the patient.  Patient was responded very little during the visit.  On exam, he had at least 2+ pitting edema.  I suspected this was due to combination of diastolic dysfunction and more importantly low albumin level.  I recommended a protein shake.  I recommended the patient see cardiology service on as-needed basis.  Patient returned back to the hospital in September due to displaced cholecystostomy drain, hospital course was complicated by UTI which was treated with empiric antibiotics for 3 days.  Blood culture negative.  He underwent exchange of cholecystostomy drain by IR on 08/20/2022.  He was felt to be a poor candidate for surgery.  He will need outpatient drain study and cholangiogram which will be arranged by IR.  Lasix was held during admission however resumed on discharge.  Family contacted [place] on 09/08/2022 requesting to take extra dose of Lasix for increased lower extremity edema.   Patient presented for virtual telephone visit accompanied by his son-in-law.  According to his son-in-law, he is completely wheelchair-bound at this point.  He has fairly poor appetite.  Previously I have instructed his daughter to try to  give him some protein shake to help with low albumin level.  He is on 4 times a week dosing of the Lasix along with potassium supplement.  Son-in-law is aware that he may take additional dose of Lasix and the potassium supplement on a as needed basis if has increased leg edema.  Otherwise, he denies any chest pain.  His breathing is unchanged.  He can continue to follow-up with  cardiology service on a as needed basis.  Past Medical History:  Diagnosis Date   Borderline diabetes    takes Metformin   Cancer ([place])    lung 2020 per spouse LUL   COPD (chronic obstructive pulmonary disease) ([place])    Diabetes mellitus without complication ([place])    DVT (deep venous thrombosis) ([place])    Full dentures    Hearing aid worn    B/L   HOH (hard of hearing)    Hypertension    Left upper lobe pulmonary nodule    Memory problem    Pneumonia    Schizo affective schizophrenia ([place])    Seizure ([place]) 05/21/2022   Seizures ([place])    Past Surgical History:  Procedure Laterality Date   cancer removal     legs   CATARACT EXTRACTION     COLONOSCOPY     FUDUCIAL PLACEMENT N/A 03/09/2019   Procedure: PLACEMENT OF FUDUCIAL;  Surgeon: [name], MD;  Location: [place];  Service: Thoracic;  Laterality: N/A;   IR EXCHANGE BILIARY DRAIN  07/08/2022   IR EXCHANGE BILIARY DRAIN  08/01/2022   IR EXCHANGE BILIARY DRAIN  08/28/2022   IR EXCHANGE BILIARY DRAIN  09/03/2022   IR PERC CHOLECYSTOSTOMY  05/23/2022   IR RADIOLOGIST EVAL & MGMT  07/11/2022   IR RADIOLOGIST EVAL & MGMT  09/10/2022   IR REMOVAL OF CALCULI/DEBRIS BILIARY DUCT/GB  08/01/2022   THROMBECTOMY     VIDEO BRONCHOSCOPY WITH ENDOBRONCHIAL NAVIGATION N/A 03/09/2019   Procedure: VIDEO BRONCHOSCOPY WITH ENDOBRONCHIAL NAVIGATION;  Surgeon: [name], MD;  Location: [facility] OR;  Service: Thoracic;  Laterality: N/A;     Current Meds  Medication Sig   aspirin EC 81 MG tablet Take 81 mg by mouth at bedtime.   atorvastatin (LIPITOR) 40 MG tablet Take 1 tablet (40 mg total) by mouth at bedtime.   Brimonidine Tartrate (LUMIFY) 0.025 % SOLN Place 1 drop into both eyes daily as needed (dry eyes).   buPROPion (WELLBUTRIN) 100 MG tablet Take 100 mg by mouth in the morning.   Calcium Carb-Cholecalciferol (CALCIUM 500 + D PO) Take 1 tablet by mouth daily.   clonazePAM (KLONOPIN) 0.5 MG tablet Take 0.25 mg by mouth in the  morning and at bedtime.   Emollient (CERAVE MOISTURIZING EX) Apply 1 application  topically 2 (two) times daily. Apply to bilateral toes-to-calves daily   fenofibrate 160 MG tablet Take 160 mg by mouth daily.   furosemide (LASIX) 20 MG tablet Take 1 tablet (20 mg total) by mouth See admin instructions. Take 20 mg by mouth in the morning on Mon/Tues/Wed/Fri (Patient taking differently: Take 20-40 mg by mouth See admin instructions. Take 20 mg by mouth in the morning on Mon/Tues/Wed/Fri May increase dose to 40 mg twice weekly as needed for swelling)   levETIRAcetam (KEPPRA) 500 MG tablet Take 1/2 tablet twice a day   linagliptin (TRADJENTA) 5 MG TABS tablet Take 5 mg by mouth every other day.   liver oil-zinc oxide (DESITIN) 40 % ointment Apply 1 Application topically as needed for  irritation.   metFORMIN (GLUCOPHAGE) 500 MG tablet Take 500 mg by mouth in the morning and at bedtime.   metoprolol tartrate (LOPRESSOR) 25 MG tablet Take 25 mg by mouth daily.    Multiple Vitamins-Minerals (MULTIVITAMIN GUMMIES MENS) CHEW Chew 1 tablet by mouth daily.   Nystatin (GERHARDT'S BUTT CREAM) CREA Apply 1 Application topically 2 (two) times daily.   OLANZapine (ZYPREXA) 10 MG tablet Take 20 mg by mouth at bedtime.   Omega-3 Fatty Acids (FISH OIL ULTRA) 1400 MG CAPS Take 1,400 mg by mouth daily.   polyethylene glycol (MIRALAX / GLYCOLAX) 17 g packet Take 8.5 g by mouth daily.   potassium chloride SA (KLOR-CON M) 20 MEQ tablet Take 1 tablet (20 mEq total) by mouth See admin instructions. Take TWO HALVES of one 20 mEq tablet by mouth every morning to equal a total dose of 20 mEq (Patient taking differently: Take 20-40 mEq by mouth See admin instructions. Take TWO HALVES of one 20 mEq tablet by mouth every morning to equal a total dose of 20 mEq Increase to 40 meq on the same days he takes 40 mg of Lasix)   Probiotic Product (PROBIOTIC PO) Take 1 capsule by mouth in the morning.   triamcinolone cream (KENALOG) 0.1 %  Apply 1 Application topically 2 (two) times daily.   TYLENOL 8 HOUR ARTHRITIS PAIN 650 MG CR tablet Take 1,300 mg by mouth in the morning and at bedtime.   warfarin (COUMADIN) 2 MG tablet Take 1 tablet (2 mg total) by mouth at bedtime. Begin taking 9/30 (hold dose 9/29) (Patient taking differently: Take 2 mg by mouth at bedtime. Take 4 MG on Thursday ONLY.)     Allergies:   Haloperidol lactate, Carbamazepine, and Sildenafil   Social History   Tobacco Use   Smoking status: Former    Packs/day: 1.00    Years: 50.00    Total pack years: 50.00    Types: Cigarettes, Pipe    Start date: 58    Quit date: 03/03/2015    Years since quitting: 7.5   Smokeless tobacco: Never   Tobacco comments:    30 yearsv cigarettes ; pipe 20 years  Vaping Use   Vaping Use: Never used  Substance Use Topics   Alcohol use: No   Drug use: No     Family Hx: The patient's family history is not on file.  ROS:   Please see the history of present illness.     All other systems reviewed and are negative.   Prior CV studies:   The following studies were reviewed today:  Echo 04/17/2022  1. Moderate septal hypertrophy with otherwise mild concentric LVH. Left  ventricular ejection fraction, by estimation, is 65 to 70%. The left  ventricle has normal function. The left ventricle has no regional wall  motion abnormalities. There is moderate  asymmetric left ventricular hypertrophy. Left ventricular diastolic  parameters are consistent with Grade II diastolic dysfunction  (pseudonormalization). Elevated left ventricular end-diastolic pressure.   2. Right ventricular systolic function is normal. The right ventricular  size is normal. There is normal pulmonary artery systolic pressure.   3. Left atrial size was mildly dilated.   4. The mitral valve is normal in structure. No evidence of mitral valve  regurgitation. No evidence of mitral stenosis.   5. The aortic valve is tricuspid. There is mild  calcification of the  aortic valve. There is mild thickening of the aortic valve. Aortic valve  regurgitation is trivial. Aortic  valve sclerosis is present, with no  evidence of aortic valve stenosis.   6. Aortic dilatation noted. There is borderline dilatation of the aortic  root, measuring 36 mm.   7. The inferior vena cava is normal in size with greater than 50%  respiratory variability, suggesting right atrial pressure of 3 mmHg.    Labs/Other Tests and Data Reviewed:    EKG:  No ECG reviewed.  Recent Labs: 05/19/2022: B Natriuretic Peptide 134.3 05/26/2022: Magnesium 2.2 08/29/2022: ALT 36; BUN 7; Creatinine, Ser 0.86; Hemoglobin 12.4; Platelets 274; Potassium 3.1; Sodium 138   Recent Lipid Panel Lab Results  Component Value Date/Time   CHOL 143 10/21/2007 09:06 AM   TRIG 112 10/21/2007 09:06 AM   TRIG 145 12/14/2006 10:05 AM   HDL 43.9 10/21/2007 09:06 AM   CHOLHDL 3.3 CALC 10/21/2007 09:06 AM   LDLCALC 77 10/21/2007 09:06 AM   LDLDIRECT 159.4 06/03/2007 09:27 AM    Wt Readings from Last 3 Encounters:  08/28/22 175 lb 14.8 oz (79.8 kg)  08/01/22 180 lb (81.6 kg)  07/14/22 180 lb (81.6 kg)     Risk Assessment/Calculations:          Objective:    Vital Signs:  Ht 5\' 8"  (1.727 m)   BMI 26.75 kg/m    VITAL SIGNS:  reviewed  ASSESSMENT & PLAN:    Leg edema: I instructed the patient's son-in-law to give him extra dose of Lasix/potassium supplement as needed based on leg edema.  He is currently on 4 times a week of Lasix along with potassium supplement.  History of DVT/PE: On Coumadin  Hypertension: Continue on the current therapy  Acute cholecystitis: Has a cholecystostomy drain placed.  Pending outpatient drain study and the cholangiogram.            Time:   Today, I have spent 5 minutes with the patient with telehealth technology discussing the above problems.     Medication Adjustments/Labs and Tests Ordered: Current medicines are reviewed at  length with the patient today.  Concerns regarding medicines are outlined above.   Tests Ordered: No orders of the defined types were placed in this encounter.   Medication Changes: No orders of the defined types were placed in this encounter.   Follow Up:      prn  Hilbert Corrigan, [place]  09/20/2022 11:00 PM    [place]

## 2022-09-18 NOTE — Patient Instructions (Addendum)
Medication Instructions:  May take an extra tablet of Lasix as needed for swelling of legs May take an extra tablet of Potassium when Lasix is taken as needed  *If you need a refill on your cardiac medications before your next appointment, please call your pharmacy*  Lab Work: NONE ordered at this time of appointment   If you have labs (blood work) drawn today and your tests are completely normal, you will receive your results only by: [place] (if you have MyChart) OR A paper copy in the mail If you have any lab test that is abnormal or we need to change your treatment, we will call you to review the results.  Testing/Procedures: NONE ordered at this time of appointment   Follow-Up: At [facility], you and your health needs are our priority.  As part of our continuing mission to provide you with exceptional heart care, we have created designated Provider Care Teams.  These Care Teams include your primary Cardiologist (physician) and Advanced Practice Providers (APPs -  Physician Assistants and Nurse Practitioners) who all work together to provide you with the care you need, when you need it.  Your next appointment:   As Needed    The format for your next appointment:   In Person  Provider:   Quay Burow, MD  or  APP         Other Instructions May give protein shake to help with low appetite and low protein level.  Important Information About Sugar

## 2022-09-18 NOTE — Telephone Encounter (Signed)
  Patient Consent for Virtual Visit        Robert Fisher has provided verbal consent on 09/18/2022 for a virtual visit (video or telephone).   CONSENT FOR VIRTUAL VISIT FOR:  Robert Fisher  By participating in this virtual visit I agree to the following:  I hereby voluntarily request, consent and authorize [facility] and its employed or contracted physicians, physician assistants, nurse practitioners or other licensed health care professionals (the Practitioner), to provide me with telemedicine health care services (the "Services") as deemed necessary by the treating Practitioner. I acknowledge and consent to receive the Services by the Practitioner via telemedicine. I understand that the telemedicine visit will involve communicating with the Practitioner through live audiovisual communication technology and the disclosure of certain medical information by electronic transmission. I acknowledge that I have been given the opportunity to request an in-person assessment or other available alternative prior to the telemedicine visit and am voluntarily participating in the telemedicine visit.  I understand that I have the right to withhold or withdraw my consent to the use of telemedicine in the course of my care at any time, without affecting my right to future care or treatment, and that the Practitioner or I may terminate the telemedicine visit at any time. I understand that I have the right to inspect all information obtained and/or recorded in the course of the telemedicine visit and may receive copies of available information for a reasonable fee.  I understand that some of the potential risks of receiving the Services via telemedicine include:  Delay or interruption in medical evaluation due to technological equipment failure or disruption; Information transmitted may not be sufficient (e.g. poor resolution of images) to allow for appropriate medical decision making by the  Practitioner; and/or  In rare instances, security protocols could fail, causing a breach of personal health information.  Furthermore, I acknowledge that it is my responsibility to provide information about my medical history, conditions and care that is complete and accurate to the best of my ability. I acknowledge that Practitioner's advice, recommendations, and/or decision may be based on factors not within their control, such as incomplete or inaccurate data provided by me or distortions of diagnostic images or specimens that may result from electronic transmissions. I understand that the practice of medicine is not an exact science and that Practitioner makes no warranties or guarantees regarding treatment outcomes. I acknowledge that a copy of this consent can be made available to me via my patient portal ([facility] [place]), or I can request a printed copy by calling the office of [employer].    I understand that my insurance will be billed for this visit.   I have read or had this consent read to me. I understand the contents of this consent, which adequately explains the benefits and risks of the Services being provided via telemedicine.  I have been provided ample opportunity to ask questions regarding this consent and the Services and have had my questions answered to my satisfaction. I give my informed consent for the services to be provided through the use of telemedicine in my medical care

## 2022-09-22 ENCOUNTER — Other Ambulatory Visit: Payer: Self-pay | Admitting: Student

## 2022-09-23 ENCOUNTER — Other Ambulatory Visit (HOSPITAL_COMMUNITY): Payer: Self-pay | Admitting: Interventional Radiology

## 2022-09-23 ENCOUNTER — Ambulatory Visit (HOSPITAL_COMMUNITY)
Admission: RE | Admit: 2022-09-23 | Discharge: 2022-09-23 | Disposition: A | Discharge status: Home / Self Care | Payer: Medicare Other | Source: Ambulatory Visit | Attending: Interventional Radiology | Admitting: Interventional Radiology

## 2022-09-23 DIAGNOSIS — K8000 Calculus of gallbladder with acute cholecystitis without obstruction: Secondary | ICD-10-CM | POA: Insufficient documentation

## 2022-09-23 DIAGNOSIS — Z434 Encounter for attention to other artificial openings of digestive tract: Secondary | ICD-10-CM | POA: Insufficient documentation

## 2022-09-23 HISTORY — PX: IR CHOLANGIOGRAM EXISTING TUBE: IMG6040

## 2022-09-23 HISTORY — PX: IR REMOVAL BILIARY DRAIN: IMG6047

## 2022-09-23 MED ORDER — IOHEXOL 300 MG/ML  SOLN
50.0000 mL | Freq: Once | INTRAMUSCULAR | Status: AC | PRN
  Administered 2022-09-23: 10 mL

## 2022-10-07 ENCOUNTER — Ambulatory Visit: Payer: Medicare Other | Admitting: Cardiovascular Disease

## 2022-10-13 ENCOUNTER — Ambulatory Visit: Payer: Medicare Other | Admitting: Podiatry

## 2022-10-15 ENCOUNTER — Ambulatory Visit: Payer: Medicare Other | Admitting: Podiatry

## 2022-10-27 ENCOUNTER — Telehealth: Payer: Self-pay | Admitting: *Deleted

## 2022-10-27 NOTE — Telephone Encounter (Signed)
Returned patient's phone call, spoke with patient's daughter- Robert Fisher

## 2022-10-29 ENCOUNTER — Ambulatory Visit: Payer: Medicare Other | Admitting: Podiatry

## 2022-10-31 ENCOUNTER — Encounter: Payer: Self-pay | Admitting: Podiatry

## 2022-10-31 ENCOUNTER — Ambulatory Visit: Payer: Medicare Other | Admitting: Podiatry

## 2022-10-31 DIAGNOSIS — E1151 Type 2 diabetes mellitus with diabetic peripheral angiopathy without gangrene: Secondary | ICD-10-CM

## 2022-10-31 DIAGNOSIS — M79675 Pain in left toe(s): Secondary | ICD-10-CM

## 2022-10-31 DIAGNOSIS — Z9181 History of falling: Secondary | ICD-10-CM | POA: Insufficient documentation

## 2022-10-31 DIAGNOSIS — F419 Anxiety disorder, unspecified: Secondary | ICD-10-CM | POA: Insufficient documentation

## 2022-10-31 DIAGNOSIS — R296 Repeated falls: Secondary | ICD-10-CM | POA: Insufficient documentation

## 2022-10-31 DIAGNOSIS — E860 Dehydration: Secondary | ICD-10-CM | POA: Insufficient documentation

## 2022-10-31 DIAGNOSIS — M47816 Spondylosis without myelopathy or radiculopathy, lumbar region: Secondary | ICD-10-CM | POA: Insufficient documentation

## 2022-10-31 DIAGNOSIS — E162 Hypoglycemia, unspecified: Secondary | ICD-10-CM | POA: Insufficient documentation

## 2022-10-31 DIAGNOSIS — L84 Corns and callosities: Secondary | ICD-10-CM | POA: Insufficient documentation

## 2022-10-31 DIAGNOSIS — S0101XA Laceration without foreign body of scalp, initial encounter: Secondary | ICD-10-CM | POA: Insufficient documentation

## 2022-10-31 DIAGNOSIS — G894 Chronic pain syndrome: Secondary | ICD-10-CM | POA: Insufficient documentation

## 2022-10-31 DIAGNOSIS — F3181 Bipolar II disorder: Secondary | ICD-10-CM | POA: Insufficient documentation

## 2022-10-31 DIAGNOSIS — M79674 Pain in right toe(s): Secondary | ICD-10-CM

## 2022-10-31 DIAGNOSIS — J432 Centrilobular emphysema: Secondary | ICD-10-CM | POA: Insufficient documentation

## 2022-10-31 DIAGNOSIS — E1165 Type 2 diabetes mellitus with hyperglycemia: Secondary | ICD-10-CM | POA: Insufficient documentation

## 2022-10-31 DIAGNOSIS — E785 Hyperlipidemia, unspecified: Secondary | ICD-10-CM | POA: Insufficient documentation

## 2022-10-31 DIAGNOSIS — B351 Tinea unguium: Secondary | ICD-10-CM | POA: Diagnosis not present

## 2022-10-31 DIAGNOSIS — F411 Generalized anxiety disorder: Secondary | ICD-10-CM | POA: Insufficient documentation

## 2022-10-31 DIAGNOSIS — C7802 Secondary malignant neoplasm of left lung: Secondary | ICD-10-CM | POA: Insufficient documentation

## 2022-10-31 DIAGNOSIS — Z0000 Encounter for general adult medical examination without abnormal findings: Secondary | ICD-10-CM | POA: Insufficient documentation

## 2022-10-31 DIAGNOSIS — C801 Malignant (primary) neoplasm, unspecified: Secondary | ICD-10-CM | POA: Insufficient documentation

## 2022-10-31 DIAGNOSIS — K862 Cyst of pancreas: Secondary | ICD-10-CM | POA: Insufficient documentation

## 2022-10-31 DIAGNOSIS — I509 Heart failure, unspecified: Secondary | ICD-10-CM | POA: Insufficient documentation

## 2022-11-02 NOTE — Progress Notes (Signed)
  Subjective:  Patient ID: Robert Fisher, male    DOB: 1934-11-16,  MRN: [id number]  Robert Fisher presents to clinic today for at risk foot care. Pt has h/o NIDDM with PAD and painful elongated mycotic toenails 1-5 bilaterally which are tender when wearing enclosed shoe gear. Pain is relieved with periodic professional debridement.   He is a patient of Dr. Burnell Blanks. Patient is accompanied by his daughter on today's visit. Daughter states Dad has been ill for the past few months and they had to cancel his last appointment. Chief Complaint  Patient presents with   Nail Problem    Diabetic foot care BS-did not check today A1C-Do not know PCP-Robert Fisher PCP V[facility]- 3 Weeks ago   New problem(s): None.   PCP is Robert Baton, MD.  Allergies  Allergen Reactions   Haloperidol Lactate Other (See Comments)    "Made him climb the walls."   Carbamazepine Other (See Comments)    Caused seizures    Sildenafil Other (See Comments)    "pain in right thigh."    Review of Systems: Negative except as noted in the HPI.  Objective: No changes noted in today's physical examination. There were no vitals filed for this visit.  Robert Fisher is a pleasant 88 y.o. male frail, in NAD. AAO x 3.  Vascular Examination: CFT <3 seconds b/l. DP/PT pulses faintly palpable b/l. Skin temperature gradient warm to warm b/l. No pain with calf compression. No ischemia or gangrene. No cyanosis or clubbing noted b/l. Pedal hair absent. +1 pitting edema noted BLE.   Neurological Examination: Sensation grossly intact b/l with 10 gram monofilament. Vibratory sensation intact b/l.   Dermatological Examination: Pedal skin warm and supple b/l. Toenails 1-5 b/l thick, discolored, elongated with subungual debris and pain on dorsal palpation.  No open wounds b/l LE. No interdigital macerations noted b/l LE. No hyperkeratotic nor porokeratotic lesions present on today's visit.  Musculoskeletal Examination: Noted  disuse atrophy bilaterally. No pain, crepitus or joint limitation noted with ROM bilateral LE. Hammertoe(s) noted to the R hallux. Utilizes wheelchair for mobility assistance.  Radiographs: None  Last A1c:      Latest Ref Rng & Units 05/20/2022    4:04 AM  Hemoglobin A1C  Hemoglobin-A1c 4.8 - 5.6 % 6.8    Assessment/Plan: 1. Pain due to onychomycosis of toenails of both feet   2. Type II diabetes mellitus with peripheral circulatory disorder (HCC)     No orders of the defined types were placed in this encounter.  -Patient's family member present. All questions/concerns addressed on today's visit. -Elevate feet when at rest to minimize edema. -Continue supportive shoe gear daily. -Toenails 1-5 bilaterally were debrided in length and girth with sterile nail nippers and dremel. Pinpoint bleeding of R 4th toe addressed with Lumicain Hemostatic Solution, cleansed with alcohol. No further treatment required by patient/caregiver. -Patient/POA to call should there be question/concern in the interim.   Return in about 3 months (around 01/30/2023).  Marzetta Board, DPM

## 2022-11-11 ENCOUNTER — Telehealth: Payer: Self-pay | Admitting: *Deleted

## 2022-11-11 NOTE — Telephone Encounter (Signed)
Called patient's daughterAilene Ravel to inform of CT for 11-19-22- arrival time- 4:15 pm @ [facility], patient to have water only- 4 hrs.  prior to test, patient to receive results from Shona Simpson on 11-25-22 @ 9 am via telephone, spoke with [name] and she is aware of these appts. and the instructions

## 2022-11-19 ENCOUNTER — Ambulatory Visit (HOSPITAL_COMMUNITY)
Admission: RE | Admit: 2022-11-19 | Discharge: 2022-11-19 | Disposition: A | Discharge status: Home / Self Care | Payer: Medicare Other | Source: Ambulatory Visit | Attending: Radiation Oncology | Admitting: Radiation Oncology

## 2022-11-19 DIAGNOSIS — E1165 Type 2 diabetes mellitus with hyperglycemia: Secondary | ICD-10-CM | POA: Insufficient documentation

## 2022-11-19 DIAGNOSIS — N1831 Chronic kidney disease, stage 3a: Secondary | ICD-10-CM | POA: Insufficient documentation

## 2022-11-19 DIAGNOSIS — C3412 Malignant neoplasm of upper lobe, left bronchus or lung: Secondary | ICD-10-CM | POA: Insufficient documentation

## 2022-11-19 LAB — POCT I-STAT CREATININE: Creatinine, Ser: 1 mg/dL (ref 0.61–1.24)

## 2022-11-19 MED ORDER — IOHEXOL 300 MG/ML  SOLN
100.0000 mL | Freq: Once | INTRAMUSCULAR | Status: AC | PRN
  Administered 2022-11-19: 100 mL via INTRAVENOUS

## 2022-11-19 MED ORDER — SODIUM CHLORIDE (PF) 0.9 % IJ SOLN
INTRAMUSCULAR | Status: AC
  Filled 2022-11-19: qty 50

## 2022-11-25 ENCOUNTER — Ambulatory Visit: Payer: Medicare Other | Admitting: Radiation Oncology

## 2022-12-05 ENCOUNTER — Encounter: Payer: Self-pay | Admitting: Radiation Oncology

## 2022-12-05 NOTE — Progress Notes (Signed)
Telephone nursing interview for patient to review most recent CT results from 11/19/22.  I verified patient's identity and began nursing interview. Patient reports a mild cold, and worsening gait/balance but is otherwise doing well. No other issues conveyed at this time.  Meaningful use complete.  Patient aware of his 3:00pm-12/08/22 telephone appointment w/ Shona Simpson PA-C. I left my extension [phone] in case patient needs anything. Patient verbalized understanding.  This concludes the interview.   Leandra Kern, LPN

## 2022-12-08 ENCOUNTER — Ambulatory Visit
Admission: RE | Admit: 2022-12-08 | Discharge: 2022-12-08 | Disposition: A | Discharge status: Home / Self Care | Payer: Medicare Other | Source: Ambulatory Visit | Attending: Radiation Oncology | Admitting: Radiation Oncology

## 2022-12-08 DIAGNOSIS — C3412 Malignant neoplasm of upper lobe, left bronchus or lung: Secondary | ICD-10-CM

## 2022-12-08 NOTE — Progress Notes (Signed)
Radiation Oncology         [phone]) [phone] ________________________________  Initial Outpatient Consultation - Conducted via telephone at patient request.  I spoke with the patient to conduct this consult visit via telephone. The patient was notified in advance and was offered an in person or telemedicine meeting to allow for face to face communication but instead preferred to proceed with a telephone consult.    Name: Robert Fisher MRN: [id number]  Date of Service: 12/08/2022  DOB: 1934/05/10   Diagnosis:  Stage IA, NSCLC,  squamous cell carcinoma of the left upper lobe  Prior Radiation:    04/11/2019-04/18/2019  SBRT Treatement: The LUL was treated to 54 Gy in 3 fractions  Narrative:  In summary, this is a pleasant  89 y.o. gentleman with a  Stage IA, NSCLC, squamous cell carcinoma of the left upper lobe. Robert Fisher was noticing cough and weakness and on 12/06/2018 he underwent a CXR that showed mild patchy LUL opacity. He was treated with antibiotics and had a CT on 01/28/2019 that revealed a 15 x 18 x 22 mm solid spiculated mass in the LUL. He did have an 11 mm AP window node and a left hilar node measuring 9 mm. PET scan on 02/14/2019 revealed an SUV of 12.6 in the LUL nodule and no evidence of hypermetabolic mediastinal or hilar adenopathy. He had low level uptake in the left axilla but this was felt to be reactive. He did undergo bronchoscopy on 03/09/2019 which revealed a squamous cell carcinoma. He was not a good surgical candidate for resection.  Rather, he proceeded with 3 fractions of SBRT which were completed in May 2020.  Since completing treatment, he has had stable CT scans.   He continues to have gallstones, and a lesion in the uncinate process of the pancreas measuring 1.5 cm that was felt to be stable from a CT scan that was performed in 2020. He had cholecystis in June 2023 and was not a surgical candidate but had a cholecystotomy tube until October 2023. His CT films did not  show concerns for lesion in his pancreas on 08/26/22. He had a repeat CT performed on 11/19/2022 that showed stable posttreatment changes in his left upper lobe.  There was also a new subpleural left lower lobe nodule.  Of note however the patient did have a recent diagnosis of COVID-19. He was managed as an outpatient by his PCP.    On review of systems, the patient reports he is doing well. He is not having any trouble with breathing, chest or abdominal pain, or fevers. He is struggling to figure out a Well[employer] he sits in. No other complaints are noted.     PAST MEDICAL HISTORY:  Past Medical History:  Diagnosis Date   Borderline diabetes    takes Metformin   Cancer ([place])    lung 2020 per spouse LUL   COPD (chronic obstructive pulmonary disease) ([place])    Diabetes mellitus without complication ([place])    DVT (deep venous thrombosis) ([place])    Full dentures    Hearing aid worn    B/L   HOH (hard of hearing)    Hypertension    Left upper lobe pulmonary nodule    Memory problem    Pneumonia    Schizo affective schizophrenia ([place])    Seizure ([place]) 05/21/2022   Seizures ([place])     PAST SURGICAL HISTORY: Past Surgical History:  Procedure Laterality Date   cancer removal  legs   CATARACT EXTRACTION     COLONOSCOPY     FUDUCIAL PLACEMENT N/A 03/09/2019   Procedure: PLACEMENT OF FUDUCIAL;  Surgeon: [name], MD;  Location: [facility];  Service: Thoracic;  Laterality: N/A;   IR CHOLANGIOGRAM EXISTING TUBE  09/23/2022   IR EXCHANGE BILIARY DRAIN  07/08/2022   IR EXCHANGE BILIARY DRAIN  08/01/2022   IR EXCHANGE BILIARY DRAIN  08/28/2022   IR EXCHANGE BILIARY DRAIN  09/03/2022   IR PERC CHOLECYSTOSTOMY  05/23/2022   IR RADIOLOGIST EVAL & MGMT  07/11/2022   IR RADIOLOGIST EVAL & MGMT  09/10/2022   IR REMOVAL BILIARY DRAIN  09/23/2022   IR REMOVAL OF CALCULI/DEBRIS BILIARY DUCT/GB  08/01/2022   THROMBECTOMY     VIDEO BRONCHOSCOPY WITH ENDOBRONCHIAL NAVIGATION N/A 03/09/2019    Procedure: VIDEO BRONCHOSCOPY WITH ENDOBRONCHIAL NAVIGATION;  Surgeon: [name], MD;  Location: [facility];  Service: Thoracic;  Laterality: N/A;    PAST SOCIAL HISTORY:  Social History   Socioeconomic History   Marital status: Married    Spouse name: Not on file   Number of children: Not on file   Years of education: Not on file   Highest education level: Not on file  Occupational History   Not on file  Tobacco Use   Smoking status: Former    Packs/day: 1.00    Years: 50.00    Total pack years: 50.00    Types: Cigarettes, Pipe    Start date: 55    Quit date: 03/03/2015    Years since quitting: 7.7   Smokeless tobacco: Never   Tobacco comments:    30 yearsv cigarettes ; pipe 20 years  Vaping Use   Vaping Use: Never used  Substance and Sexual Activity   Alcohol use: No   Drug use: No   Sexual activity: Not Currently  Other Topics Concern   Not on file  Social History Narrative   Right handed    Lives with wife daughter is with him right now has cameras to watch in a one story home   Caffeine 1 cup a day   Social Determinants of Health   Financial Resource Strain: Not on file  Food Insecurity: No Food Insecurity (08/28/2022)   Hunger Vital Sign    Worried About Running Out of Food in the Last Year: Never true    Ran Out of Food in the Last Year: Never true  Transportation Needs: No Transportation Needs (08/28/2022)   PRAPARE - Hydrologist (Medical): No    Lack of Transportation (Non-Medical): No  Physical Activity: Not on file  Stress: Not on file  Social Connections: Not on file  Intimate Partner Violence: Not At Risk (08/28/2022)   Humiliation, Afraid, Rape, and Kick questionnaire    Fear of Current or Ex-Partner: No    Emotionally Abused: No    Physically Abused: No    Sexually Abused: No  The patient is married and lives in [place].    PAST FAMILY HISTORY:History reviewed. No pertinent family  history.  MEDICATIONS  Current Outpatient Medications  Medication Sig Dispense Refill   aspirin EC 81 MG tablet Take 81 mg by mouth at bedtime.     atorvastatin (LIPITOR) 40 MG tablet Take 1 tablet (40 mg total) by mouth at bedtime.  2   Brimonidine Tartrate (LUMIFY) 0.025 % SOLN Place 1 drop into both eyes daily as needed (dry eyes).     buPROPion (WELLBUTRIN) 100 MG  tablet Take 100 mg by mouth in the morning.     Calcium Carb-Cholecalciferol (CALCIUM 500 + D PO) Take 1 tablet by mouth daily.     clonazePAM (KLONOPIN) 0.5 MG tablet Take 0.25 mg by mouth in the morning and at bedtime.     Emollient (CERAVE MOISTURIZING EX) Apply 1 application  topically 2 (two) times daily. Apply to bilateral toes-to-calves daily     fenofibrate 160 MG tablet Take 160 mg by mouth daily.     furosemide (LASIX) 20 MG tablet Take 1 tablet (20 mg total) by mouth See admin instructions. Take 20 mg by mouth in the morning on Mon/Tues/Wed/Fri (Patient taking differently: Take 20-40 mg by mouth See admin instructions. Take 20 mg by mouth in the morning on Mon/Tues/Wed/Fri May increase dose to 40 mg twice weekly as needed for swelling) 30 tablet 6   levETIRAcetam (KEPPRA) 500 MG tablet Take 1/2 tablet twice a day 90 tablet 3   linagliptin (TRADJENTA) 5 MG TABS tablet Take 5 mg by mouth every other day.     liver oil-zinc oxide (DESITIN) 40 % ointment Apply 1 Application topically as needed for irritation.     metFORMIN (GLUCOPHAGE) 500 MG tablet Take 500 mg by mouth in the morning and at bedtime.     metoprolol tartrate (LOPRESSOR) 25 MG tablet Take 25 mg by mouth daily.      Multiple Vitamins-Minerals (MULTIVITAMIN GUMMIES MENS) CHEW Chew 1 tablet by mouth daily.     Nystatin (GERHARDT'S BUTT CREAM) CREA Apply 1 Application topically 2 (two) times daily. 1 each 0   OLANZapine (ZYPREXA) 10 MG tablet Take 20 mg by mouth at bedtime.     Omega-3 Fatty Acids (FISH OIL ULTRA) 1400 MG CAPS Take 1,400 mg by mouth daily.      polyethylene glycol (MIRALAX / GLYCOLAX) 17 g packet Take 8.5 g by mouth daily.     potassium chloride SA (KLOR-CON M) 20 MEQ tablet Take 1 tablet (20 mEq total) by mouth See admin instructions. Take TWO HALVES of one 20 mEq tablet by mouth every morning to equal a total dose of 20 mEq (Patient taking differently: Take 20-40 mEq by mouth See admin instructions. Take TWO HALVES of one 20 mEq tablet by mouth every morning to equal a total dose of 20 mEq Increase to 40 meq on the same days he takes 40 mg of Lasix) 180 tablet 1   Probiotic Product (PROBIOTIC PO) Take 1 capsule by mouth in the morning.     triamcinolone cream (KENALOG) 0.1 % Apply 1 Application topically 2 (two) times daily.     TYLENOL 8 HOUR ARTHRITIS PAIN 650 MG CR tablet Take 1,300 mg by mouth in the morning and at bedtime.     warfarin (COUMADIN) 2 MG tablet Take 1 tablet (2 mg total) by mouth at bedtime. Begin taking 9/30 (hold dose 9/29) (Patient taking differently: Take 2 mg by mouth at bedtime. Take 4 MG on Thursday ONLY.)     No current facility-administered medications for this encounter.    ALLERGIES:  Allergies  Allergen Reactions   Haloperidol Lactate Other (See Comments)    "Made him climb the walls."   Carbamazepine Other (See Comments)    Caused seizures    Sildenafil Other (See Comments)    "pain in right thigh."   Physical Exam: Unable to assess due to encounter type.   Impression/Plan: 1. Stage IA, NSCLC,  squamous cell carcinoma of the left upper lobe.  The  patient appears to be doing well radiographically in the lungs regarding his history of lung cancer.  As he still remains functional and has good performance, he is still a good candidate for surveillance.  We discussed continued follow-up but given the new finding of LLL nodule I would recommend repeat in 4 months. This is likely inflammatory given his recent history of covid-19, but we will make sure this isn't suspicious given his history of early  stage cancer. Following this next scan we can hopefully return to 6 month intervals with CT imaging.   2. History of cholecystitis. The patient will follow up with his pcp and if needed with surgery or IR in the future.  3. Uncinate nodule. This has not been noted on most recent scans. We will follow this expectantly but general surgery and IR are both aware of this pt and his history. We can reach out in the future if needed.    This encounter was conducted via telephone.  The patient has provided two factor identification and has given verbal consent for this type of encounter and has been advised to only accept a meeting of this type in a secure network environment. The time spent during this encounter was 35 minutes including preparation, discussion, and coordination of the patient's care. The attendants for this meeting include  Hayden Pedro  and KINGSTEN ENFIELD and his daughter [name].  During the encounter,  Hayden Pedro was located at [facility]iation Oncology Department.  Jodene Nam was located at home with his daughter [name].       Carola Rhine, PAC

## 2022-12-22 DIAGNOSIS — F1320 Sedative, hypnotic or anxiolytic dependence, uncomplicated: Secondary | ICD-10-CM | POA: Diagnosis not present

## 2022-12-22 DIAGNOSIS — I5032 Chronic diastolic (congestive) heart failure: Secondary | ICD-10-CM | POA: Diagnosis not present

## 2022-12-22 DIAGNOSIS — N1831 Chronic kidney disease, stage 3a: Secondary | ICD-10-CM | POA: Diagnosis not present

## 2022-12-22 DIAGNOSIS — I130 Hypertensive heart and chronic kidney disease with heart failure and stage 1 through stage 4 chronic kidney disease, or unspecified chronic kidney disease: Secondary | ICD-10-CM | POA: Diagnosis not present

## 2022-12-22 DIAGNOSIS — I700 Atherosclerosis of aorta: Secondary | ICD-10-CM | POA: Diagnosis not present

## 2022-12-22 DIAGNOSIS — E1122 Type 2 diabetes mellitus with diabetic chronic kidney disease: Secondary | ICD-10-CM | POA: Diagnosis not present

## 2022-12-22 DIAGNOSIS — C3412 Malignant neoplasm of upper lobe, left bronchus or lung: Secondary | ICD-10-CM | POA: Diagnosis not present

## 2022-12-22 DIAGNOSIS — I739 Peripheral vascular disease, unspecified: Secondary | ICD-10-CM | POA: Diagnosis not present

## 2022-12-22 DIAGNOSIS — F209 Schizophrenia, unspecified: Secondary | ICD-10-CM | POA: Diagnosis not present

## 2022-12-22 DIAGNOSIS — Z7901 Long term (current) use of anticoagulants: Secondary | ICD-10-CM | POA: Diagnosis not present

## 2022-12-22 DIAGNOSIS — E1151 Type 2 diabetes mellitus with diabetic peripheral angiopathy without gangrene: Secondary | ICD-10-CM | POA: Diagnosis not present

## 2022-12-22 DIAGNOSIS — L89150 Pressure ulcer of sacral region, unstageable: Secondary | ICD-10-CM | POA: Diagnosis not present

## 2022-12-26 DIAGNOSIS — R627 Adult failure to thrive: Secondary | ICD-10-CM | POA: Diagnosis not present

## 2022-12-26 DIAGNOSIS — T148XXA Other injury of unspecified body region, initial encounter: Secondary | ICD-10-CM | POA: Diagnosis not present

## 2022-12-26 DIAGNOSIS — R296 Repeated falls: Secondary | ICD-10-CM | POA: Diagnosis not present

## 2022-12-26 DIAGNOSIS — Z7901 Long term (current) use of anticoagulants: Secondary | ICD-10-CM | POA: Diagnosis not present

## 2022-12-26 DIAGNOSIS — R791 Abnormal coagulation profile: Secondary | ICD-10-CM | POA: Diagnosis not present

## 2022-12-26 DIAGNOSIS — I8291 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: Secondary | ICD-10-CM | POA: Diagnosis not present

## 2022-12-29 DIAGNOSIS — E1169 Type 2 diabetes mellitus with other specified complication: Secondary | ICD-10-CM | POA: Diagnosis not present

## 2022-12-29 DIAGNOSIS — I10 Essential (primary) hypertension: Secondary | ICD-10-CM | POA: Diagnosis not present

## 2022-12-29 DIAGNOSIS — R32 Unspecified urinary incontinence: Secondary | ICD-10-CM | POA: Diagnosis not present

## 2022-12-29 DIAGNOSIS — E1165 Type 2 diabetes mellitus with hyperglycemia: Secondary | ICD-10-CM | POA: Diagnosis not present

## 2022-12-29 DIAGNOSIS — E785 Hyperlipidemia, unspecified: Secondary | ICD-10-CM | POA: Diagnosis not present

## 2022-12-29 DIAGNOSIS — F209 Schizophrenia, unspecified: Secondary | ICD-10-CM | POA: Diagnosis not present

## 2022-12-29 DIAGNOSIS — F331 Major depressive disorder, recurrent, moderate: Secondary | ICD-10-CM | POA: Diagnosis not present

## 2022-12-29 DIAGNOSIS — Z7982 Long term (current) use of aspirin: Secondary | ICD-10-CM | POA: Diagnosis not present

## 2022-12-29 DIAGNOSIS — H9193 Unspecified hearing loss, bilateral: Secondary | ICD-10-CM | POA: Diagnosis not present

## 2022-12-29 DIAGNOSIS — Z7901 Long term (current) use of anticoagulants: Secondary | ICD-10-CM | POA: Diagnosis not present

## 2022-12-29 DIAGNOSIS — G40909 Epilepsy, unspecified, not intractable, without status epilepticus: Secondary | ICD-10-CM | POA: Diagnosis not present

## 2022-12-29 DIAGNOSIS — F419 Anxiety disorder, unspecified: Secondary | ICD-10-CM | POA: Diagnosis not present

## 2022-12-31 DIAGNOSIS — F3181 Bipolar II disorder: Secondary | ICD-10-CM | POA: Diagnosis not present

## 2023-01-20 ENCOUNTER — Encounter: Payer: Self-pay | Admitting: Neurology

## 2023-01-20 ENCOUNTER — Ambulatory Visit (INDEPENDENT_AMBULATORY_CARE_PROVIDER_SITE_OTHER): Payer: PPO | Admitting: Neurology

## 2023-01-20 VITALS — BP 88/54 | HR 78 | Ht 68.0 in | Wt 190.0 lb

## 2023-01-20 DIAGNOSIS — R251 Tremor, unspecified: Secondary | ICD-10-CM

## 2023-01-20 NOTE — Patient Instructions (Addendum)
Good to see you. Continue Keppra (levetiracetam) 500mg : take 1/2 tablet twice a day. Follow-up with PCP tomorrow as scheduled. Follow-up with me in 8 months, call for any changes.

## 2023-01-20 NOTE — Progress Notes (Signed)
NEUROLOGY FOLLOW UP OFFICE NOTE  Robert Fisher [id number] Feb 22, 1934  HISTORY OF PRESENT ILLNESS: I had the pleasure of seeing Robert Fisher in follow-up in the neurology clinic on 01/20/2023.  The patient was last seen 7 months ago. He is again accompanied by his daughter Robert Fisher who helps supplement the history today.  Records and images were personally reviewed where available.  Since his last visit, Robert Fisher reports he was admitted for displaced cholecystotomy drain and UTI. After hospitalization, he had COVID. She thought he starting to do better until BP checked in office today was 88/54. When staff was checking him in, deep wound was noted on the left shin, which Robert Fisher reports looks worse than before. No fevers. He is awake in the office today, dysarthric (which is baseline, although Robert Fisher notes his partials were out making speech seem more slurred). She gives him the Juven drink daily, as well as apple juice and water. No shaking episodes since 08/2021, he is on low dose Levetiracetam 500mg  1/2 tab BID (250mg  BID) without side effects. Last month he was started on gabapentin because he kept dropping things, getting stuck. She feels Gabapentin 100mg  qhs has helped a lot with sleep and symptoms, he is better at holding things. She asks about memory medication. He has fallen quite a bit. Family has been providing excellent care, she had been staying at their home for the past 3 years and reports caregiver fatigue. They have put up cameras as family are unable to be there 24/7 however she and her husband come by 3-4 times a day to make sure he has what he needs so he does not get up, she changes his diaper 2-3 times a day. She reports he is happy at home with his wife, that he would be miserable and still fall if he were in a SNF. He sleeps all night. 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    History on Initial Assessment 08/12/2021: This is a pleasant  89 year old right-handed man with a history of hypertension, COPD, lung cancer, schizophrenia, presenting for evaluation of seizure. Robert Fisher reports that he had a seizure 20 years ago from "too much Tegretol." They were at a restaurant and he had full body shaking. His schizophrenia has been stable on medication for 25 years now. No further seizures until 07/28/21, when Robert Fisher witnessed a 3-second episode of generalized shaking followed by falling asleep. Robert Fisher recalls they were watching Robert Fisher when she saw his whole body shaking and eyes going in strange directions. She tapped him on the shoulder, his head was turned to one side (she cannot recall which side), this lasted 5 seconds then he told his daughter he was simply falling asleep and refused to go to the ER until 2 days later. She had checked his BP an hour later and it was normal. He had been otherwise fine the whole day and missing his wife who has been in rehab. In the ER, he reported having a "feeling in his head" 4-6 months prior where he has twitching in his head. CBC, BMP were normal. 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 They were unaware of any stroke symptoms in the past. He was discharged home on Levetiracetam 500mg  BID. He was back in the ER on 08/08/21  due to generalized weakness, he could hardly stand up.  Repeat bloodwork showed slightly worsened creatinine from 1.25 to 1.32. Levetiracetam was reduced to 250mg  BID, with no further weakness since then. He uses a walker at home. Robert Fisher denies any episodes of staring/unresponsiveness, he denies any olfactory/gustatory hallucinations, deja vu, rising epigastric sensation, focal numbness/tingling/weakness. He manages his own medications and finances without issues, denies missing clonazepam dose. He  accidentally wrote a check recently, overdrawn by accident due to all his donations. He does not drive. He had a normal birth and early development.  There is no history of febrile convulsions, CNS infections such as meningitis/encephalitis, significant traumatic brain injury, neurosurgical procedures, or family history of seizures.   PAST MEDICAL HISTORY: Past Medical History:  Diagnosis Date   Borderline diabetes    takes Metformin   Cancer ([place])    lung 2020 per spouse LUL   COPD (chronic obstructive pulmonary disease) ([place])    Diabetes mellitus without complication ([place])    DVT (deep venous thrombosis) ([place])    Full dentures    Hearing aid worn    B/L   HOH (hard of hearing)    Hypertension    Left upper lobe pulmonary nodule    Memory problem    Pneumonia    Schizo affective schizophrenia ([place])    Seizure ([place]) 05/21/2022   Seizures ([place])     MEDICATIONS: Current Outpatient Medications on File Prior to Visit  Medication Sig Dispense Refill   aspirin EC 81 MG tablet Take 81 mg by mouth at bedtime.     atorvastatin (LIPITOR) 40 MG tablet Take 1 tablet (40 mg total) by mouth at bedtime.  2   Brimonidine Tartrate (LUMIFY) 0.025 % SOLN Place 1 drop into both eyes daily as needed (dry eyes).     buPROPion (WELLBUTRIN) 100 MG tablet Take 100 mg by mouth in the morning.     Calcium Carb-Cholecalciferol (CALCIUM 500 + D PO) Take 1 tablet by mouth daily.     clonazePAM (KLONOPIN) 0.5 MG tablet Take 0.25 mg by mouth in the morning and at bedtime.     Emollient (CERAVE MOISTURIZING EX) Apply 1 application  topically 2 (two) times daily. Apply to bilateral toes-to-calves daily     fenofibrate 160 MG tablet Take 160 mg by mouth daily.     furosemide (LASIX) 20 MG tablet Take 1 tablet (20 mg total) by mouth See admin instructions. Take 20 mg by mouth in the morning on Mon/Tues/Wed/Fri (Patient taking differently: Take 20-40 mg by mouth See admin instructions. Take 20 mg by mouth in the  morning on Mon/Tues/Wed/Fri May increase dose to 40 mg twice weekly as needed for swelling) 30 tablet 6   levETIRAcetam (KEPPRA) 500 MG tablet Take 1/2 tablet twice a day 90 tablet 3   linagliptin (TRADJENTA) 5 MG TABS tablet Take 5 mg by mouth every other day.     liver oil-zinc oxide (DESITIN) 40 % ointment Apply 1 Application topically as needed for irritation.     metFORMIN (GLUCOPHAGE) 500 MG tablet Take 500 mg by mouth in the morning and at bedtime.     metoprolol tartrate (LOPRESSOR) 25 MG tablet Take 25 mg by mouth daily.      Multiple Vitamins-Minerals (MULTIVITAMIN GUMMIES MENS) CHEW Chew 1 tablet by mouth daily.     Nystatin (GERHARDT'S BUTT CREAM) CREA Apply 1 Application topically 2 (two) times daily. 1 each 0   OLANZapine (ZYPREXA) 10 MG tablet Take 20 mg by mouth  at bedtime.     Omega-3 Fatty Acids (FISH OIL ULTRA) 1400 MG CAPS Take 1,400 mg by mouth daily.     polyethylene glycol (MIRALAX / GLYCOLAX) 17 g packet Take 8.5 g by mouth daily.     potassium chloride SA (KLOR-CON M) 20 MEQ tablet Take 1 tablet (20 mEq total) by mouth See admin instructions. Take TWO HALVES of one 20 mEq tablet by mouth every morning to equal a total dose of 20 mEq (Patient taking differently: Take 20-40 mEq by mouth See admin instructions. Take TWO HALVES of one 20 mEq tablet by mouth every morning to equal a total dose of 20 mEq Increase to 40 meq on the same days he takes 40 mg of Lasix) 180 tablet 1   Probiotic Product (PROBIOTIC PO) Take 1 capsule by mouth in the morning.     triamcinolone cream (KENALOG) 0.1 % Apply 1 Application topically 2 (two) times daily.     TYLENOL 8 HOUR ARTHRITIS PAIN 650 MG CR tablet Take 1,300 mg by mouth in the morning and at bedtime.     warfarin (COUMADIN) 2 MG tablet Take 1 tablet (2 mg total) by mouth at bedtime. Begin taking 9/30 (hold dose 9/29) (Patient taking differently: Take 2 mg by mouth at bedtime. Take 4 MG on Thursday ONLY.)     No current  facility-administered medications on file prior to visit.    ALLERGIES: Allergies  Allergen Reactions   Haloperidol Lactate Other (See Comments)    "Made him climb the walls."   Carbamazepine Other (See Comments)    Caused seizures    Sildenafil Other (See Comments)    "pain in right thigh."    FAMILY HISTORY: No family history on file.  SOCIAL HISTORY: Social History   Socioeconomic History   Marital status: Married    Spouse name: Not on file   Number of children: Not on file   Years of education: Not on file   Highest education level: Not on file  Occupational History   Not on file  Tobacco Use   Smoking status: Former    Packs/day: 1.00    Years: 50.00    Total pack years: 50.00    Types: Cigarettes, Pipe    Start date: 42    Quit date: 03/03/2015    Years since quitting: 7.8   Smokeless tobacco: Never   Tobacco comments:    30 yearsv cigarettes ; pipe 20 years  Vaping Use   Vaping Use: Never used  Substance and Sexual Activity   Alcohol use: No   Drug use: No   Sexual activity: Not Currently  Other Topics Concern   Not on file  Social History Narrative   Right handed    Lives with wife daughter is with him right now has cameras to watch in a one story home   Caffeine 1 cup a day   Social Determinants of Health   Financial Resource Strain: Not on file  Food Insecurity: No Food Insecurity (08/28/2022)   Hunger Vital Sign    Worried About Running Out of Food in the Last Year: Never true    Golf in the Last Year: Never true  Transportation Needs: No Transportation Needs (08/28/2022)   PRAPARE - Hydrologist (Medical): No    Lack of Transportation (Non-Medical): No  Physical Activity: Not on file  Stress: Not on file  Social Connections: Not on file  Intimate Partner Violence: Not  At Risk (08/28/2022)   Humiliation, Afraid, Rape, and Kick questionnaire    Fear of Current or Ex-Partner: No    Emotionally Abused:  No    Physically Abused: No    Sexually Abused: No     PHYSICAL EXAM: Vitals:   01/20/23 1415  BP: (!) 88/54  Pulse: 78  SpO2: 98%   General: No acute distress Head:  Normocephalic/atraumatic Skin/Extremities: black wound on the left shin Neurological Exam: alert and awake, smiling, answers questions with moderate dysarthria. Cranial nerves: Pupils equal, round. Extraocular movements intact with no nystagmus. Visual fields full. Shallow left nasolabial fold. Motor: Bulk and tone normal, muscle strength 5/5 on both UE, right LE. Difficulty with left hip flexion and knee extension, 3/5. Finger to nose testing intact.  Gait not tested. No tremors.    IMPRESSION: This is a pleasant 89 yo RH man with a history of hypertension, COPD, lung cancer, schizophrenia, with episodes of shaking. MRI without contrast in 08/2021 no acute changes, there was moderate diffuse atrophy and mild chronic microvascular disease. His 1-hour wake and sleep EEG in 08/2021 was normal. No shaking spells since 08/2021 on low dose Levetiracetam 250mg  BID (500mg  1/2 tab BID), refills sent. He is noted to be hypotensive, he is awake and alert, daughter denies any fevers and states he has been staying hydrated. Advised to discuss with PCP. His daughter asked about memory medication, discussed expectations from memory medications, and at this point, risks (side effects) outweigh potential benefits. We had an extensive discussion about caregiver fatigue, she and her husband have been providing excellent care but this is difficult to sustain. She would like for him to stay at home with his wife and feels he would still fall and would not do well in a SNF. Continue close supervision. Follow-up in 8 months, call for any changes.    Thank you for allowing me to participate in his care.  Please do not hesitate to call for any questions or concerns.    Ellouise Newer, M.D.   CC: Dr. Virgina Jock

## 2023-01-21 DIAGNOSIS — L089 Local infection of the skin and subcutaneous tissue, unspecified: Secondary | ICD-10-CM | POA: Diagnosis not present

## 2023-01-21 DIAGNOSIS — R600 Localized edema: Secondary | ICD-10-CM | POA: Diagnosis not present

## 2023-01-21 DIAGNOSIS — E860 Dehydration: Secondary | ICD-10-CM | POA: Diagnosis not present

## 2023-01-21 DIAGNOSIS — I130 Hypertensive heart and chronic kidney disease with heart failure and stage 1 through stage 4 chronic kidney disease, or unspecified chronic kidney disease: Secondary | ICD-10-CM | POA: Diagnosis not present

## 2023-01-21 DIAGNOSIS — I8291 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: Secondary | ICD-10-CM | POA: Diagnosis not present

## 2023-01-21 DIAGNOSIS — N1831 Chronic kidney disease, stage 3a: Secondary | ICD-10-CM | POA: Diagnosis not present

## 2023-01-21 DIAGNOSIS — Z7689 Persons encountering health services in other specified circumstances: Secondary | ICD-10-CM | POA: Diagnosis not present

## 2023-01-21 DIAGNOSIS — T148XXA Other injury of unspecified body region, initial encounter: Secondary | ICD-10-CM | POA: Diagnosis not present

## 2023-01-21 DIAGNOSIS — I5032 Chronic diastolic (congestive) heart failure: Secondary | ICD-10-CM | POA: Diagnosis not present

## 2023-01-21 DIAGNOSIS — I9589 Other hypotension: Secondary | ICD-10-CM | POA: Diagnosis not present

## 2023-01-21 DIAGNOSIS — Z7901 Long term (current) use of anticoagulants: Secondary | ICD-10-CM | POA: Diagnosis not present

## 2023-01-25 ENCOUNTER — Encounter (HOSPITAL_COMMUNITY): Payer: Self-pay | Admitting: *Deleted

## 2023-01-25 ENCOUNTER — Inpatient Hospital Stay (HOSPITAL_COMMUNITY)
Admission: EM | Admit: 2023-01-25 | Discharge: 2023-02-02 | DRG: 871 | Disposition: A | Discharge status: Hospice | Payer: PPO | Attending: Internal Medicine | Admitting: Internal Medicine

## 2023-01-25 ENCOUNTER — Emergency Department (HOSPITAL_COMMUNITY): Payer: PPO

## 2023-01-25 DIAGNOSIS — Z86711 Personal history of pulmonary embolism: Secondary | ICD-10-CM | POA: Diagnosis not present

## 2023-01-25 DIAGNOSIS — Z9049 Acquired absence of other specified parts of digestive tract: Secondary | ICD-10-CM

## 2023-01-25 DIAGNOSIS — F200 Paranoid schizophrenia: Secondary | ICD-10-CM | POA: Diagnosis present

## 2023-01-25 DIAGNOSIS — A419 Sepsis, unspecified organism: Principal | ICD-10-CM

## 2023-01-25 DIAGNOSIS — I10 Essential (primary) hypertension: Secondary | ICD-10-CM | POA: Diagnosis present

## 2023-01-25 DIAGNOSIS — N1831 Chronic kidney disease, stage 3a: Secondary | ICD-10-CM | POA: Diagnosis present

## 2023-01-25 DIAGNOSIS — E1122 Type 2 diabetes mellitus with diabetic chronic kidney disease: Secondary | ICD-10-CM | POA: Diagnosis present

## 2023-01-25 DIAGNOSIS — G3289 Other specified degenerative disorders of nervous system in diseases classified elsewhere: Secondary | ICD-10-CM | POA: Diagnosis present

## 2023-01-25 DIAGNOSIS — R262 Difficulty in walking, not elsewhere classified: Secondary | ICD-10-CM | POA: Diagnosis present

## 2023-01-25 DIAGNOSIS — I358 Other nonrheumatic aortic valve disorders: Secondary | ICD-10-CM | POA: Diagnosis present

## 2023-01-25 DIAGNOSIS — Z87891 Personal history of nicotine dependence: Secondary | ICD-10-CM

## 2023-01-25 DIAGNOSIS — L98493 Non-pressure chronic ulcer of skin of other sites with necrosis of muscle: Secondary | ICD-10-CM | POA: Diagnosis not present

## 2023-01-25 DIAGNOSIS — J449 Chronic obstructive pulmonary disease, unspecified: Secondary | ICD-10-CM | POA: Diagnosis present

## 2023-01-25 DIAGNOSIS — L8990 Pressure ulcer of unspecified site, unspecified stage: Secondary | ICD-10-CM | POA: Insufficient documentation

## 2023-01-25 DIAGNOSIS — C3412 Malignant neoplasm of upper lobe, left bronchus or lung: Secondary | ICD-10-CM | POA: Diagnosis present

## 2023-01-25 DIAGNOSIS — E785 Hyperlipidemia, unspecified: Secondary | ICD-10-CM | POA: Diagnosis present

## 2023-01-25 DIAGNOSIS — F209 Schizophrenia, unspecified: Secondary | ICD-10-CM | POA: Diagnosis not present

## 2023-01-25 DIAGNOSIS — Z7189 Other specified counseling: Secondary | ICD-10-CM | POA: Diagnosis not present

## 2023-01-25 DIAGNOSIS — F0393 Unspecified dementia, unspecified severity, with mood disturbance: Secondary | ICD-10-CM | POA: Diagnosis present

## 2023-01-25 DIAGNOSIS — F3181 Bipolar II disorder: Secondary | ICD-10-CM | POA: Diagnosis present

## 2023-01-25 DIAGNOSIS — R6521 Severe sepsis with septic shock: Secondary | ICD-10-CM | POA: Diagnosis present

## 2023-01-25 DIAGNOSIS — R600 Localized edema: Secondary | ICD-10-CM | POA: Diagnosis not present

## 2023-01-25 DIAGNOSIS — R509 Fever, unspecified: Secondary | ICD-10-CM | POA: Diagnosis not present

## 2023-01-25 DIAGNOSIS — T7601XA Adult neglect or abandonment, suspected, initial encounter: Secondary | ICD-10-CM | POA: Diagnosis present

## 2023-01-25 DIAGNOSIS — Y92009 Unspecified place in unspecified non-institutional (private) residence as the place of occurrence of the external cause: Secondary | ICD-10-CM | POA: Diagnosis not present

## 2023-01-25 DIAGNOSIS — Z7401 Bed confinement status: Secondary | ICD-10-CM

## 2023-01-25 DIAGNOSIS — S3730XA Unspecified injury of urethra, initial encounter: Secondary | ICD-10-CM | POA: Diagnosis not present

## 2023-01-25 DIAGNOSIS — I129 Hypertensive chronic kidney disease with stage 1 through stage 4 chronic kidney disease, or unspecified chronic kidney disease: Secondary | ICD-10-CM | POA: Diagnosis present

## 2023-01-25 DIAGNOSIS — I493 Ventricular premature depolarization: Secondary | ICD-10-CM | POA: Diagnosis not present

## 2023-01-25 DIAGNOSIS — G8929 Other chronic pain: Secondary | ICD-10-CM | POA: Diagnosis present

## 2023-01-25 DIAGNOSIS — R791 Abnormal coagulation profile: Secondary | ICD-10-CM | POA: Diagnosis not present

## 2023-01-25 DIAGNOSIS — Z515 Encounter for palliative care: Secondary | ICD-10-CM | POA: Diagnosis not present

## 2023-01-25 DIAGNOSIS — R339 Retention of urine, unspecified: Secondary | ICD-10-CM | POA: Diagnosis present

## 2023-01-25 DIAGNOSIS — L89890 Pressure ulcer of other site, unstageable: Secondary | ICD-10-CM | POA: Diagnosis present

## 2023-01-25 DIAGNOSIS — W07XXXA Fall from chair, initial encounter: Secondary | ICD-10-CM | POA: Diagnosis present

## 2023-01-25 DIAGNOSIS — F411 Generalized anxiety disorder: Secondary | ICD-10-CM | POA: Diagnosis not present

## 2023-01-25 DIAGNOSIS — I4901 Ventricular fibrillation: Secondary | ICD-10-CM | POA: Diagnosis not present

## 2023-01-25 DIAGNOSIS — T1490XA Injury, unspecified, initial encounter: Secondary | ICD-10-CM | POA: Diagnosis not present

## 2023-01-25 DIAGNOSIS — T8383XA Hemorrhage of genitourinary prosthetic devices, implants and grafts, initial encounter: Secondary | ICD-10-CM | POA: Diagnosis not present

## 2023-01-25 DIAGNOSIS — B961 Klebsiella pneumoniae [K. pneumoniae] as the cause of diseases classified elsewhere: Secondary | ICD-10-CM | POA: Diagnosis present

## 2023-01-25 DIAGNOSIS — R220 Localized swelling, mass and lump, head: Secondary | ICD-10-CM | POA: Diagnosis not present

## 2023-01-25 DIAGNOSIS — N3000 Acute cystitis without hematuria: Secondary | ICD-10-CM | POA: Diagnosis present

## 2023-01-25 DIAGNOSIS — G894 Chronic pain syndrome: Secondary | ICD-10-CM | POA: Diagnosis present

## 2023-01-25 DIAGNOSIS — E440 Moderate protein-calorie malnutrition: Secondary | ICD-10-CM | POA: Diagnosis present

## 2023-01-25 DIAGNOSIS — N400 Enlarged prostate without lower urinary tract symptoms: Secondary | ICD-10-CM | POA: Diagnosis present

## 2023-01-25 DIAGNOSIS — I959 Hypotension, unspecified: Secondary | ICD-10-CM | POA: Diagnosis not present

## 2023-01-25 DIAGNOSIS — F0394 Unspecified dementia, unspecified severity, with anxiety: Secondary | ICD-10-CM | POA: Diagnosis present

## 2023-01-25 DIAGNOSIS — F419 Anxiety disorder, unspecified: Secondary | ICD-10-CM | POA: Diagnosis present

## 2023-01-25 DIAGNOSIS — R319 Hematuria, unspecified: Secondary | ICD-10-CM | POA: Diagnosis not present

## 2023-01-25 DIAGNOSIS — T148XXA Other injury of unspecified body region, initial encounter: Secondary | ICD-10-CM | POA: Diagnosis not present

## 2023-01-25 DIAGNOSIS — M6289 Other specified disorders of muscle: Secondary | ICD-10-CM

## 2023-01-25 DIAGNOSIS — Z86718 Personal history of other venous thrombosis and embolism: Secondary | ICD-10-CM

## 2023-01-25 DIAGNOSIS — M7989 Other specified soft tissue disorders: Secondary | ICD-10-CM | POA: Diagnosis not present

## 2023-01-25 DIAGNOSIS — M25462 Effusion, left knee: Secondary | ICD-10-CM | POA: Diagnosis not present

## 2023-01-25 DIAGNOSIS — R627 Adult failure to thrive: Secondary | ICD-10-CM | POA: Diagnosis present

## 2023-01-25 DIAGNOSIS — W19XXXA Unspecified fall, initial encounter: Secondary | ICD-10-CM

## 2023-01-25 DIAGNOSIS — Z720 Tobacco use: Secondary | ICD-10-CM | POA: Diagnosis present

## 2023-01-25 DIAGNOSIS — N3001 Acute cystitis with hematuria: Secondary | ICD-10-CM | POA: Diagnosis present

## 2023-01-25 DIAGNOSIS — Z7901 Long term (current) use of anticoagulants: Secondary | ICD-10-CM

## 2023-01-25 DIAGNOSIS — N390 Urinary tract infection, site not specified: Secondary | ICD-10-CM | POA: Diagnosis present

## 2023-01-25 DIAGNOSIS — Z1611 Resistance to penicillins: Secondary | ICD-10-CM | POA: Diagnosis present

## 2023-01-25 DIAGNOSIS — Z85118 Personal history of other malignant neoplasm of bronchus and lung: Secondary | ICD-10-CM

## 2023-01-25 DIAGNOSIS — Z66 Do not resuscitate: Secondary | ICD-10-CM | POA: Diagnosis present

## 2023-01-25 DIAGNOSIS — Z8701 Personal history of pneumonia (recurrent): Secondary | ICD-10-CM

## 2023-01-25 DIAGNOSIS — Z043 Encounter for examination and observation following other accident: Secondary | ICD-10-CM | POA: Diagnosis not present

## 2023-01-25 DIAGNOSIS — G9341 Metabolic encephalopathy: Secondary | ICD-10-CM

## 2023-01-25 DIAGNOSIS — Z79899 Other long term (current) drug therapy: Secondary | ICD-10-CM

## 2023-01-25 DIAGNOSIS — R569 Unspecified convulsions: Secondary | ICD-10-CM

## 2023-01-25 DIAGNOSIS — Z6826 Body mass index (BMI) 26.0-26.9, adult: Secondary | ICD-10-CM

## 2023-01-25 DIAGNOSIS — Y658 Other specified misadventures during surgical and medical care: Secondary | ICD-10-CM | POA: Diagnosis not present

## 2023-01-25 DIAGNOSIS — E860 Dehydration: Secondary | ICD-10-CM | POA: Diagnosis not present

## 2023-01-25 DIAGNOSIS — R471 Dysarthria and anarthria: Secondary | ICD-10-CM | POA: Diagnosis present

## 2023-01-25 DIAGNOSIS — R310 Gross hematuria: Secondary | ICD-10-CM | POA: Diagnosis not present

## 2023-01-25 DIAGNOSIS — Z7984 Long term (current) use of oral hypoglycemic drugs: Secondary | ICD-10-CM

## 2023-01-25 DIAGNOSIS — G40909 Epilepsy, unspecified, not intractable, without status epilepticus: Secondary | ICD-10-CM | POA: Diagnosis present

## 2023-01-25 DIAGNOSIS — R410 Disorientation, unspecified: Secondary | ICD-10-CM | POA: Diagnosis not present

## 2023-01-25 DIAGNOSIS — R338 Other retention of urine: Secondary | ICD-10-CM | POA: Diagnosis not present

## 2023-01-25 DIAGNOSIS — E876 Hypokalemia: Secondary | ICD-10-CM | POA: Diagnosis present

## 2023-01-25 LAB — CBC
HCT: 46.2 % (ref 39.0–52.0)
Hemoglobin: 13.8 g/dL (ref 13.0–17.0)
MCH: 27.7 pg (ref 26.0–34.0)
MCHC: 29.9 g/dL — ABNORMAL LOW (ref 30.0–36.0)
MCV: 92.8 fL (ref 80.0–100.0)
Platelets: 306 10*3/uL (ref 150–400)
RBC: 4.98 MIL/uL (ref 4.22–5.81)
RDW: 16.5 % — ABNORMAL HIGH (ref 11.5–15.5)
WBC: 9.9 10*3/uL (ref 4.0–10.5)
nRBC: 0 % (ref 0.0–0.2)

## 2023-01-25 LAB — CBC WITH DIFFERENTIAL/PLATELET
Abs Immature Granulocytes: 0 10*3/uL (ref 0.00–0.07)
Basophils Absolute: 0 10*3/uL (ref 0.0–0.1)
Basophils Relative: 1 %
Eosinophils Absolute: 0 10*3/uL (ref 0.0–0.5)
Eosinophils Relative: 1 %
HCT: 40.2 % (ref 39.0–52.0)
Hemoglobin: 12.6 g/dL — ABNORMAL LOW (ref 13.0–17.0)
Immature Granulocytes: 0 %
Lymphocytes Relative: 31 %
Lymphs Abs: 0.7 10*3/uL (ref 0.7–4.0)
MCH: 28.5 pg (ref 26.0–34.0)
MCHC: 31.3 g/dL (ref 30.0–36.0)
MCV: 91 fL (ref 80.0–100.0)
Monocytes Absolute: 0 10*3/uL — ABNORMAL LOW (ref 0.1–1.0)
Monocytes Relative: 2 %
Neutro Abs: 1.6 10*3/uL — ABNORMAL LOW (ref 1.7–7.7)
Neutrophils Relative %: 65 %
Platelets: 223 10*3/uL (ref 150–400)
RBC: 4.42 MIL/uL (ref 4.22–5.81)
RDW: 16.4 % — ABNORMAL HIGH (ref 11.5–15.5)
WBC: 2.4 10*3/uL — ABNORMAL LOW (ref 4.0–10.5)
nRBC: 0 % (ref 0.0–0.2)

## 2023-01-25 LAB — COMPREHENSIVE METABOLIC PANEL
ALT: 17 U/L (ref 0–44)
AST: 30 U/L (ref 15–41)
Albumin: 3.3 g/dL — ABNORMAL LOW (ref 3.5–5.0)
Alkaline Phosphatase: 41 U/L (ref 38–126)
Anion gap: 12 (ref 5–15)
BUN: 18 mg/dL (ref 8–23)
CO2: 20 mmol/L — ABNORMAL LOW (ref 22–32)
Calcium: 9.5 mg/dL (ref 8.9–10.3)
Chloride: 109 mmol/L (ref 98–111)
Creatinine, Ser: 1.22 mg/dL (ref 0.61–1.24)
GFR, Estimated: 58 mL/min — ABNORMAL LOW (ref >60)
Glucose, Bld: 190 mg/dL — ABNORMAL HIGH (ref 70–99)
Potassium: 3.6 mmol/L (ref 3.5–5.1)
Sodium: 141 mmol/L (ref 135–145)
Total Bilirubin: 0.8 mg/dL (ref 0.3–1.2)
Total Protein: 7 g/dL (ref 6.5–8.1)

## 2023-01-25 LAB — BASIC METABOLIC PANEL
Anion gap: 11 (ref 5–15)
BUN: 14 mg/dL (ref 8–23)
CO2: 20 mmol/L — ABNORMAL LOW (ref 22–32)
Calcium: 8.7 mg/dL — ABNORMAL LOW (ref 8.9–10.3)
Chloride: 111 mmol/L (ref 98–111)
Creatinine, Ser: 1.17 mg/dL (ref 0.61–1.24)
GFR, Estimated: >60 mL/min (ref >60)
Glucose, Bld: 119 mg/dL — ABNORMAL HIGH (ref 70–99)
Potassium: 3.1 mmol/L — ABNORMAL LOW (ref 3.5–5.1)
Sodium: 142 mmol/L (ref 135–145)

## 2023-01-25 LAB — PROTIME-INR
INR: 4.6 (ref 0.8–1.2)
Prothrombin Time: 43.2 seconds — ABNORMAL HIGH (ref 11.4–15.2)

## 2023-01-25 LAB — CK: Total CK: 558 U/L — ABNORMAL HIGH (ref 49–397)

## 2023-01-25 LAB — LACTIC ACID, PLASMA: Lactic Acid, Venous: 1.5 mmol/L (ref 0.5–1.9)

## 2023-01-25 MED ORDER — LIDOCAINE HCL URETHRAL/MUCOSAL 2 % EX GEL
1.0000 | Freq: Once | CUTANEOUS | Status: AC
  Administered 2023-01-25: 1 via TOPICAL

## 2023-01-25 MED ORDER — SODIUM CHLORIDE 0.9 % IV BOLUS
1000.0000 mL | Freq: Once | INTRAVENOUS | Status: AC
  Administered 2023-01-25: 1000 mL via INTRAVENOUS

## 2023-01-25 NOTE — ED Notes (Signed)
Xray remains at [facility] for portable pelvis, and L tib/fib

## 2023-01-25 NOTE — ED Notes (Signed)
C-collar removed by EDP.  Family arriving to [facility].

## 2023-01-25 NOTE — ED Notes (Signed)
Back from CT, remains on monitor, c-collar remains, no changes, resting/ sleeping.

## 2023-01-25 NOTE — ED Provider Notes (Signed)
[facility] Provider Note   CSN: [id number] Arrival date & time: 01/25/23  1030     History  Chief Complaint  Patient presents with   Robert Fisher    CHRISTOPHERJAME Fisher is a 89 y.o. male.  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  Per EMS, pt lives at home with his wife.  He does not walk.  He requires 24 hour care.  His daughter and her husband are there frequently to check on pt.  Pt's recliner has a function which elevates the seat so a person can stand up.  Pt apparently activated this function and fell out, hitting his face.  Per EMS, pt had dried food all over him.  The living conditions were not good.  Pt is unable to contribute to history.       Home Medications Prior to Admission medications   Medication Sig Start Date End Date Taking? Authorizing Provider  aspirin EC 81 MG tablet Take 81 mg by mouth at bedtime.    [provider]  atorvastatin (LIPITOR) 40 MG tablet Take 1 tablet (40 mg total) by mouth at bedtime. 06/03/22   Eugenie Filler, MD  Brimonidine Tartrate (LUMIFY) 0.025 % SOLN Place 1 drop into both eyes daily as needed (dry eyes).    [provider]  buPROPion (WELLBUTRIN) 100 MG tablet Take 100 mg by mouth in the morning.    [provider]  Calcium Carb-Cholecalciferol (CALCIUM 500 + D PO) Take 1 tablet by mouth daily.    [provider]  clonazePAM (KLONOPIN) 0.5 MG tablet Take 0.25 mg by mouth in the morning and at bedtime.    [provider]  Emollient (CERAVE MOISTURIZING EX) Apply 1 application  topically 2 (two) times daily. Apply to bilateral toes-to-calves daily    [provider]  fenofibrate 160 MG tablet Take 160 mg by mouth daily.    [provider]  furosemide (LASIX) 20 MG tablet Take 1 tablet (20 mg total) by mouth  See admin instructions. Take 20 mg by mouth in the morning on Mon/Tues/Wed/Fri Patient taking differently: Take 20-40 mg by mouth See admin instructions. Take 20 mg by mouth in the morning on Mon/Tues/Wed/Fri May increase dose to 40 mg twice weekly as needed for swelling 07/23/22   Lorretta Harp, MD  gabapentin (NEURONTIN) 100 MG capsule Take 100 mg by mouth at bedtime.    [provider]  levETIRAcetam (KEPPRA) 500 MG tablet Take 1/2 tablet twice a day 06/20/22   Cameron Sprang, MD  linagliptin (TRADJENTA) 5 MG TABS tablet Take 5 mg by mouth every other day.    [provider]  liver oil-zinc oxide (DESITIN) 40 % ointment Apply 1 Application topically as needed for irritation.    [provider]  metFORMIN (GLUCOPHAGE) 500 MG tablet Take 500 mg by mouth in the morning and at bedtime.    [provider]  metoprolol tartrate (LOPRESSOR) 25 MG tablet Take 25 mg by mouth daily.     [provider]  Multiple Vitamins-Minerals (MULTIVITAMIN GUMMIES MENS) CHEW Chew 1 tablet by mouth daily.    [provider]  Nystatin (GERHARDT'S BUTT CREAM) CREA Apply 1 Application topically 2 (two) times daily. 08/29/22   Nita Sells, MD  OLANZapine (ZYPREXA) 10 MG tablet Take 20 mg by mouth at bedtime.  [provider]  Omega-3 Fatty Acids (FISH OIL ULTRA) 1400 MG CAPS Take 1,400 mg by mouth daily.    [provider]  polyethylene glycol (MIRALAX / GLYCOLAX) 17 g packet Take 8.5 g by mouth daily.    [provider]  potassium chloride SA (KLOR-CON M) 20 MEQ tablet Take 1 tablet (20 mEq total) by mouth See admin instructions. Take TWO HALVES of one 20 mEq tablet by mouth every morning to equal a total dose of 20 mEq Patient taking differently: Take 20-40 mEq by mouth See admin instructions. Take TWO HALVES of one 20 mEq tablet by mouth every morning to equal a total dose of 20 mEq Increase to 40 meq on the same days he takes 40  mg of Lasix 07/23/22   Lorretta Harp, MD  Probiotic Product (PROBIOTIC PO) Take 1 capsule by mouth in the morning.    [provider]  triamcinolone cream (KENALOG) 0.1 % Apply 1 Application topically 2 (two) times daily.    [provider]  TYLENOL 8 HOUR ARTHRITIS PAIN 650 MG CR tablet Take 1,300 mg by mouth in the morning and at bedtime.    [provider]  warfarin (COUMADIN) 2 MG tablet Take 1 tablet (2 mg total) by mouth at bedtime. Begin taking 9/30 (hold dose 9/29) Patient taking differently: Take 2 mg by mouth at bedtime. Take 4 MG on Thursday ONLY. 08/29/22   Nita Sells, MD      Allergies    Haloperidol lactate, Carbamazepine, and Sildenafil    Review of Systems   Review of Systems  Unable to perform ROS: Dementia  All other systems reviewed and are negative.   Physical Exam Updated Vital Signs BP 126/68   Pulse 97   Temp 99.4 F (37.4 C) (Rectal)   Resp 14   Ht '5\' 10"'$  (1.778 m)   Wt 86.2 kg   SpO2 93%   BMI 27.26 kg/m  Physical Exam Vitals and nursing note reviewed.  Constitutional:      Appearance: Normal appearance.  HENT:     Head: Normocephalic and atraumatic.     Right Ear: External ear normal.     Left Ear: External ear normal.     Nose: Nose normal.     Mouth/Throat:     Mouth: Mucous membranes are dry.  Eyes:     Extraocular Movements: Extraocular movements intact.     Conjunctiva/sclera: Conjunctivae normal.     Pupils: Pupils are equal, round, and reactive to light.  Neck:     Comments: In c-collar Cardiovascular:     Rate and Rhythm: Normal rate and regular rhythm.     Pulses: Normal pulses.     Heart sounds: Normal heart sounds.  Pulmonary:     Effort: Pulmonary effort is normal.     Breath sounds: Normal breath sounds.  Abdominal:     General: Abdomen is flat. Bowel sounds are normal.     Palpations: Abdomen is soft.  Musculoskeletal:        General: No deformity.  Skin:    Capillary Refill:  Capillary refill takes less than 2 seconds.     Comments: Sore to left lower leg.  Tendon and muscles visualized.  Neurological:     Mental Status: He is alert. He is disoriented.     Comments: Dysarthria (chronic)     ED Results / Procedures / Treatments   Labs (all labs ordered are listed, but only abnormal results are displayed) Labs  Reviewed  COMPREHENSIVE METABOLIC PANEL - Abnormal; Notable for the following components:      Result Value   CO2 20 (*)    Glucose, Bld 190 (*)    Albumin 3.3 (*)    GFR, Estimated 58 (*)    All other components within normal limits  CBC - Abnormal; Notable for the following components:   MCHC 29.9 (*)    RDW 16.5 (*)    All other components within normal limits  PROTIME-INR - Abnormal; Notable for the following components:   Prothrombin Time 43.2 (*)    INR 4.6 (*)    All other components within normal limits  CK - Abnormal; Notable for the following components:   Total CK 558 (*)    All other components within normal limits  CULTURE, BLOOD (ROUTINE X 2)  CULTURE, BLOOD (ROUTINE X 2)  LACTIC ACID, PLASMA  URINALYSIS, ROUTINE W REFLEX MICROSCOPIC  LACTIC ACID, PLASMA    EKG None  Radiology CT CERVICAL SPINE WO CONTRAST  Result Date: 01/25/2023 CLINICAL DATA:  Trauma EXAM: CT HEAD WITHOUT CONTRAST CT CERVICAL SPINE WITHOUT CONTRAST TECHNIQUE: Multidetector CT imaging of the head and cervical spine was performed following the standard protocol without intravenous contrast. Multiplanar CT image reconstructions of the cervical spine were also generated. RADIATION DOSE REDUCTION: This exam was performed according to the departmental dose-optimization program which includes automated exposure control, adjustment of the mA and/or kV according to patient size and/or use of iterative reconstruction technique. COMPARISON:  CT C Spine and Head 04/03/22 FINDINGS: CT HEAD FINDINGS Brain: No evidence of acute infarction, hemorrhage, hydrocephalus,  extra-axial collection or mass lesion/mass effect. Sequela of moderate to severe chronic microvascular ischemic change. Generalized volume loss. Vascular: No disproportionately hyperdense vessel or unexpected calcification. Skull: Normal. Negative for fracture or focal lesion. Radiodensities along the frontal scalp are favored to represent dermal calcifications. Mild soft tissue swelling along the left parietal scalp. Sinuses/Orbits: Bilateral lens replacement. No middle ear or mastoid effusion. There are frothy secretions in the right sphenoid sinus. Other: None. CT CERVICAL SPINE FINDINGS Alignment: There is trace anterolisthesis of C3 on C4. Trace retrolisthesis of C6 onC7. Skull base and vertebrae: No acute fracture. No primary bone lesion or focal pathologic process. There are multilevel degenerative endplate changes with osseous fusion of C5-C6 and severe disc space loss at C6-C7. Soft tissues and spinal canal: No prevertebral fluid or swelling. No visible canal hematoma. Disc levels:  No evidence of high-grade spinal canal stenosis. Upper chest: There fissural fluid on the left.  No pneumothorax. Other: None IMPRESSION: 1. No CT evidence of intracranial injury. 2. Mild soft tissue swelling along the left parietal scalp. No evidence of underlying calvarial fracture. 3. No acute cervical spine fracture. Electronically Signed   By: Marin Roberts M.D.   On: 01/25/2023 12:27   CT HEAD WO CONTRAST  Result Date: 01/25/2023 CLINICAL DATA:  Trauma EXAM: CT HEAD WITHOUT CONTRAST CT CERVICAL SPINE WITHOUT CONTRAST TECHNIQUE: Multidetector CT imaging of the head and cervical spine was performed following the standard protocol without intravenous contrast. Multiplanar CT image reconstructions of the cervical spine were also generated. RADIATION DOSE REDUCTION: This exam was performed according to the departmental dose-optimization program which includes automated exposure control, adjustment of the mA and/or kV  according to patient size and/or use of iterative reconstruction technique. COMPARISON:  CT C Spine and Head 04/03/22 FINDINGS: CT HEAD FINDINGS Brain: No evidence of acute infarction, hemorrhage, hydrocephalus, extra-axial collection or mass lesion/mass effect. Sequela  of moderate to severe chronic microvascular ischemic change. Generalized volume loss. Vascular: No disproportionately hyperdense vessel or unexpected calcification. Skull: Normal. Negative for fracture or focal lesion. Radiodensities along the frontal scalp are favored to represent dermal calcifications. Mild soft tissue swelling along the left parietal scalp. Sinuses/Orbits: Bilateral lens replacement. No middle ear or mastoid effusion. There are frothy secretions in the right sphenoid sinus. Other: None. CT CERVICAL SPINE FINDINGS Alignment: There is trace anterolisthesis of C3 on C4. Trace retrolisthesis of C6 onC7. Skull base and vertebrae: No acute fracture. No primary bone lesion or focal pathologic process. There are multilevel degenerative endplate changes with osseous fusion of C5-C6 and severe disc space loss at C6-C7. Soft tissues and spinal canal: No prevertebral fluid or swelling. No visible canal hematoma. Disc levels:  No evidence of high-grade spinal canal stenosis. Upper chest: There fissural fluid on the left.  No pneumothorax. Other: None IMPRESSION: 1. No CT evidence of intracranial injury. 2. Mild soft tissue swelling along the left parietal scalp. No evidence of underlying calvarial fracture. 3. No acute cervical spine fracture. Electronically Signed   By: Marin Roberts M.D.   On: 01/25/2023 12:27   DG Tibia/Fibula Left Port  Result Date: 01/25/2023 CLINICAL DATA:  Trauma, sore on posterior mid LEFT tib-fib. EXAM: PORTABLE LEFT TIBIA AND FIBULA - 2 VIEW COMPARISON:  None Available. FINDINGS: No acute findings. No evidence of fracture line or acutely displaced fracture fragment. No acute-appearing cortical irregularity or  osseous lesion. Degenerative osteoarthritic changes at the LEFT knee, at least moderate in degree with associated osteophyte formation. Soft tissue lucency overlying the distal LEFT tib-fib, presumably corresponding to the sore described in the clinical data. No evidence of soft tissue gas elsewhere in the LEFT calf. IMPRESSION: 1. Soft tissue lucency overlying the distal LEFT tib-fib, presumably corresponding to the sore described in the clinical data. No evidence of underlying osteomyelitis or fracture. 2. No acute osseous abnormality. Electronically Signed   By: Franki Cabot M.D.   On: 01/25/2023 11:09   DG Pelvis Portable  Result Date: 01/25/2023 CLINICAL DATA:  89 year old male status post fall. EXAM: PORTABLE PELVIS 1-2 VIEWS COMPARISON:  CT Abdomen and Pelvis 08/26/2022. FINDINGS: Portable AP supine view at 1040 hours. Chronic right inguinal surgical clips. Chronic bilateral acetabulum spurring. Bone mineralization is normal for age. Femoral heads remain normally located. Pelvis appears stable and intact. Grossly intact proximal femurs. Negative visible bowel gas. Chronic lumbosacral junction disc and endplate degeneration. IMPRESSION: No acute fracture or dislocation identified about the pelvis. Electronically Signed   By: Genevie Ann M.D.   On: 01/25/2023 11:08   DG Chest Port 1 View  Result Date: 01/25/2023 CLINICAL DATA:  89 year old male with history of trauma. EXAM: PORTABLE CHEST 1 VIEW COMPARISON:  Chest x-ray 08/26/2022.  Chest CT 11/19/2022. FINDINGS: Chronic mass-like area of architectural distortion in the left upper lobe, with fiducial markers in this region, similar to the prior radiograph corresponding to areas of postradiation fibrosis on prior chest CT. No acute consolidative airspace disease. Blunting of the left costophrenic sulcus, similar to prior studies, attributable to a prominent fat pad in the lower left hemithorax on prior chest CT. Right lung is clear. No right pleural  effusion. No pneumothorax. No evidence of pulmonary edema. Heart size is normal. The patient is rotated to the left on today's exam, resulting in distortion of the mediastinal contours and reduced diagnostic sensitivity and specificity for mediastinal pathology. Atherosclerotic calcifications in the thoracic aorta. IMPRESSION: 1. Stable radiographic  appearance of the chest, as above, without findings to suggest acute cardiopulmonary disease. 2. Aortic atherosclerosis. Electronically Signed   By: Vinnie Langton M.D.   On: 01/25/2023 11:05    Procedures Procedures    Medications Ordered in ED Medications  sodium chloride 0.9 % bolus 1,000 mL (has no administration in time range)  sodium chloride 0.9 % bolus 1,000 mL (0 mLs Intravenous Stopped 01/25/23 1335)    ED Course/ Medical Decision Making/ A&P                             Medical Decision Making Amount and/or Complexity of Data Reviewed Labs: ordered. Radiology: ordered.   This patient presents to the ED for concern of trauma, this involves an extensive number of treatment options, and is a complaint that carries with it a high risk of complications and morbidity.  The differential diagnosis includes multiple trauma   Co morbidities that complicate the patient evaluation  DVT (on coumadin), HTN, DM, schizoaffective d/o, lung cancer, COPD, HOH, seizures, likely dementia and ambulatory dysfunction   Additional history obtained:  Additional history obtained from epic chart review External records from outside source obtained and reviewed including EMS report   Lab Tests:  I Ordered, and personally interpreted labs.  The pertinent results include:  inr elevated at 4.6, cbc nl   Imaging Studies ordered:  I ordered imaging studies including cxr, pelvis, ct head/c-spine, left tib/fib  I independently visualized and interpreted imaging which showed  CXR: . Stable radiographic appearance of the chest, as above, without   findings to suggest acute cardiopulmonary disease.  2. Aortic atherosclerosis.  Pelvis: No acute fracture or dislocation identified about the pelvis.  Tib/fib (Left): No acute fracture or dislocation identified about the pelvis.  CT head/Cspine:  No CT evidence of intracranial injury.  2. Mild soft tissue swelling along the left parietal scalp. No  evidence of underlying calvarial fracture.  3. No acute cervical spine fracture.   I agree with the radiologist interpretation   Cardiac Monitoring:  The patient was maintained on a cardiac monitor.  I personally viewed and interpreted the cardiac monitored which showed an underlying rhythm of: nsr   Medicines ordered and prescription drug management:  I ordered medication including ivfs  for dehydration  Reevaluation of the patient after these medicines showed that the patient improved I have reviewed the patients home medicines and have made adjustments as needed   Test Considered:  ct   Critical Interventions:  ivfs   Problem List / ED Course:  Fall:  no fractures.  Pt's daughter has been trying to keep pt at home.  She and her husband are exhausted.  She requests rehab. Nonhealing wound:  no s/s osteomyelitis; will need wound care Urethral trauma:  pt's nurse did an I/o cath.  He reports the cath became stuck and caused some bleeding.  I removed the cath and it had a kink in it.  Lidocaine applied to bladder.  Awaiting urine output.  I suspect this is b/c of elevated inr.     Reevaluation:  After the interventions noted above, I reevaluated the patient and found that they have :improved   Social Determinants of Health:  Lives at home   Dispostion:  Pending at shift change        Final Clinical Impression(s) / ED Diagnoses Final diagnoses:  Fall, initial encounter  Nonhealing nonsurgical wound with necrosis of muscle  Elevated INR  Urethral trauma, initial encounter    Rx / DC Orders ED  Discharge Orders     None         Isla Pence, MD 01/25/23 1513

## 2023-01-25 NOTE — ED Notes (Signed)
Attempted to insert Coude Cath per order. Unsuccessful attempt. Moderate amount of blood noted coming from penis. Dr. Sherry Ruffing made aware.

## 2023-01-25 NOTE — Progress Notes (Signed)
Orthopedic Tech Progress Note Patient Details:  Robert Fisher 1934/07/29 [id number] Level 2 Trauma  Patient ID: Robert Fisher, male   DOB: January 05, 1934, 89 y.o.   MRN: [id number]  Robert Fisher 01/25/2023, 10:36 AM

## 2023-01-25 NOTE — ED Notes (Signed)
Blankets remain. No signs of pain. Sleeping, arousable to voice. No changes. Condom cath applied. To CT by stretcher with RN at this time.

## 2023-01-25 NOTE — ED Notes (Signed)
Sonorous resps. Arousable to voice, denies pain.

## 2023-01-25 NOTE — Progress Notes (Signed)
   01/25/23 1015  Spiritual Encounters  Type of Visit Initial  Care provided to: Patient  Conversation partners present during encounter Nurse  Referral source Trauma page  Reason for visit Trauma  OnCall Visit Yes   Chp responded to fall on thinners code. Pt unavailable as medical team conducted their work.  No support persons present/none arrived.  Chaplain remains available by page.

## 2023-01-25 NOTE — ED Notes (Signed)
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 EMS reports "poor disheveled living conditions, dried food on face, mouth, and chest, APS will be contacted". Skin tear noted to L hand. Chronic wound to L lateral calf, stage 3. Warm to touch. CBG 263. VSS. C-collar in place. NSL 18g, L FA. Pt alert, interactive, confused, calm.

## 2023-01-25 NOTE — ED Notes (Signed)
Xrays complete

## 2023-01-25 NOTE — ED Notes (Signed)
Urology Cart at the bedside

## 2023-01-25 NOTE — ED Notes (Signed)
Urologist at the bedside. Placed 14 Fr  Foley Cath in the pt

## 2023-01-25 NOTE — Discharge Instructions (Addendum)
Hold INR tomorrow (2/26) and 2/27

## 2023-01-25 NOTE — ED Notes (Signed)
Trauma Response Nurse Documentation  Robert Fisher is a 89 y.o. male arriving to [facility] ED via EMS  On warfarin daily. Trauma was activated as a Level 2 based on the following trauma criteria Elderly patients > 65 with head trauma on anti-coagulation (excluding ASA). Trauma team at the bedside on patient arrival.   Patient cleared for CT by Dr. Gilford Raid. Pt transported to CT with trauma response nurse present to monitor. RN remained with the patient throughout their absence from the department for clinical observation. Patient minimally responsive, responds to voice but does not follow commands of attempt communication.  History   Past Medical History:  Diagnosis Date   Borderline diabetes    takes Metformin   Cancer ([place])    lung 2020 per spouse LUL   COPD (chronic obstructive pulmonary disease) ([place])    Diabetes mellitus without complication ([place])    DVT (deep venous thrombosis) ([place])    Full dentures    Hearing aid worn    B/L   HOH (hard of hearing)    Hypertension    Left upper lobe pulmonary nodule    Memory problem    Pneumonia    Schizo affective schizophrenia ([place])    Seizure ([place]) 05/21/2022   Seizures ([place])      Past Surgical History:  Procedure Laterality Date   cancer removal     legs   CATARACT EXTRACTION     COLONOSCOPY     FUDUCIAL PLACEMENT N/A 03/09/2019   Procedure: PLACEMENT OF FUDUCIAL;  Surgeon: [name], MD;  Location: [facility];  Service: Thoracic;  Laterality: N/A;   IR CHOLANGIOGRAM EXISTING TUBE  09/23/2022   IR EXCHANGE BILIARY DRAIN  07/08/2022   IR EXCHANGE BILIARY DRAIN  08/01/2022   IR EXCHANGE BILIARY DRAIN  08/28/2022   IR EXCHANGE BILIARY DRAIN  09/03/2022   IR PERC CHOLECYSTOSTOMY  05/23/2022   IR RADIOLOGIST EVAL & MGMT  07/11/2022   IR RADIOLOGIST EVAL & MGMT  09/10/2022   IR REMOVAL BILIARY DRAIN  09/23/2022   IR REMOVAL OF CALCULI/DEBRIS BILIARY DUCT/GB  08/01/2022   THROMBECTOMY     VIDEO BRONCHOSCOPY WITH ENDOBRONCHIAL  NAVIGATION N/A 03/09/2019   Procedure: VIDEO BRONCHOSCOPY WITH ENDOBRONCHIAL NAVIGATION;  Surgeon: [name], MD;  Location: [facility];  Service: Thoracic;  Laterality: N/A;     Initial Focused Assessment (If applicable, or please see trauma documentation): I was not present during initial assessment due to another L2 trauma, this assessment was performed 30 mins after arrival. Patient responds to voice, minimal interaction, does not follow commands, does not open eyes No obvious trauma other than skin tear to L hand See previous charting/triage note for further details  CT's Completed:   CT Head and CT C-Spine   Interventions:  IV, labs CXR/PXR/Tib/fib XR CT Head/Cspine  Plan for disposition:  Unknown - pending full workup/living condition status  Event Summary: Pending final results, see triage note for arrival details. Imaging negative for traumatic injury.    Bedside handoff with ED RN Wille Glaser.    [place] Vicie Cech  Trauma Response RN  Please call TRN at [phone] for further assistance.
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
## 2023-01-25 NOTE — ED Notes (Signed)
Bladder Scan >711.  EDP notified

## 2023-01-25 NOTE — ED Notes (Signed)
Delay in CT due to all rooms occupied, will transport as soon as possible.

## 2023-01-26 ENCOUNTER — Inpatient Hospital Stay (HOSPITAL_COMMUNITY): Payer: PPO

## 2023-01-26 ENCOUNTER — Other Ambulatory Visit: Payer: Self-pay

## 2023-01-26 DIAGNOSIS — I358 Other nonrheumatic aortic valve disorders: Secondary | ICD-10-CM | POA: Diagnosis present

## 2023-01-26 DIAGNOSIS — G3289 Other specified degenerative disorders of nervous system in diseases classified elsewhere: Secondary | ICD-10-CM | POA: Diagnosis present

## 2023-01-26 DIAGNOSIS — S3730XA Unspecified injury of urethra, initial encounter: Secondary | ICD-10-CM | POA: Diagnosis not present

## 2023-01-26 DIAGNOSIS — F0393 Unspecified dementia, unspecified severity, with mood disturbance: Secondary | ICD-10-CM | POA: Diagnosis present

## 2023-01-26 DIAGNOSIS — R627 Adult failure to thrive: Secondary | ICD-10-CM | POA: Diagnosis present

## 2023-01-26 DIAGNOSIS — T8383XA Hemorrhage of genitourinary prosthetic devices, implants and grafts, initial encounter: Secondary | ICD-10-CM | POA: Diagnosis not present

## 2023-01-26 DIAGNOSIS — Y92009 Unspecified place in unspecified non-institutional (private) residence as the place of occurrence of the external cause: Secondary | ICD-10-CM | POA: Diagnosis not present

## 2023-01-26 DIAGNOSIS — F3181 Bipolar II disorder: Secondary | ICD-10-CM | POA: Diagnosis present

## 2023-01-26 DIAGNOSIS — W07XXXA Fall from chair, initial encounter: Secondary | ICD-10-CM | POA: Diagnosis present

## 2023-01-26 DIAGNOSIS — I959 Hypotension, unspecified: Secondary | ICD-10-CM

## 2023-01-26 DIAGNOSIS — J449 Chronic obstructive pulmonary disease, unspecified: Secondary | ICD-10-CM | POA: Diagnosis present

## 2023-01-26 DIAGNOSIS — W19XXXA Unspecified fall, initial encounter: Secondary | ICD-10-CM

## 2023-01-26 DIAGNOSIS — A419 Sepsis, unspecified organism: Secondary | ICD-10-CM | POA: Diagnosis present

## 2023-01-26 DIAGNOSIS — N3000 Acute cystitis without hematuria: Secondary | ICD-10-CM | POA: Diagnosis present

## 2023-01-26 DIAGNOSIS — Z1611 Resistance to penicillins: Secondary | ICD-10-CM | POA: Diagnosis present

## 2023-01-26 DIAGNOSIS — R319 Hematuria, unspecified: Secondary | ICD-10-CM | POA: Diagnosis not present

## 2023-01-26 DIAGNOSIS — Z7189 Other specified counseling: Secondary | ICD-10-CM | POA: Diagnosis not present

## 2023-01-26 DIAGNOSIS — I4901 Ventricular fibrillation: Secondary | ICD-10-CM | POA: Diagnosis not present

## 2023-01-26 DIAGNOSIS — Z515 Encounter for palliative care: Secondary | ICD-10-CM | POA: Diagnosis not present

## 2023-01-26 DIAGNOSIS — R509 Fever, unspecified: Secondary | ICD-10-CM | POA: Diagnosis not present

## 2023-01-26 DIAGNOSIS — E440 Moderate protein-calorie malnutrition: Secondary | ICD-10-CM | POA: Diagnosis present

## 2023-01-26 DIAGNOSIS — E860 Dehydration: Secondary | ICD-10-CM

## 2023-01-26 DIAGNOSIS — R6521 Severe sepsis with septic shock: Secondary | ICD-10-CM | POA: Diagnosis present

## 2023-01-26 DIAGNOSIS — I129 Hypertensive chronic kidney disease with stage 1 through stage 4 chronic kidney disease, or unspecified chronic kidney disease: Secondary | ICD-10-CM | POA: Diagnosis present

## 2023-01-26 DIAGNOSIS — Z86711 Personal history of pulmonary embolism: Secondary | ICD-10-CM | POA: Diagnosis not present

## 2023-01-26 DIAGNOSIS — F200 Paranoid schizophrenia: Secondary | ICD-10-CM | POA: Diagnosis present

## 2023-01-26 DIAGNOSIS — N3001 Acute cystitis with hematuria: Secondary | ICD-10-CM | POA: Diagnosis present

## 2023-01-26 DIAGNOSIS — Y658 Other specified misadventures during surgical and medical care: Secondary | ICD-10-CM | POA: Diagnosis not present

## 2023-01-26 DIAGNOSIS — L89890 Pressure ulcer of other site, unstageable: Secondary | ICD-10-CM | POA: Diagnosis present

## 2023-01-26 DIAGNOSIS — R339 Retention of urine, unspecified: Secondary | ICD-10-CM | POA: Diagnosis present

## 2023-01-26 DIAGNOSIS — N390 Urinary tract infection, site not specified: Secondary | ICD-10-CM | POA: Insufficient documentation

## 2023-01-26 DIAGNOSIS — Z66 Do not resuscitate: Secondary | ICD-10-CM | POA: Diagnosis present

## 2023-01-26 DIAGNOSIS — N1831 Chronic kidney disease, stage 3a: Secondary | ICD-10-CM | POA: Diagnosis present

## 2023-01-26 DIAGNOSIS — T7601XA Adult neglect or abandonment, suspected, initial encounter: Secondary | ICD-10-CM | POA: Diagnosis present

## 2023-01-26 DIAGNOSIS — F0394 Unspecified dementia, unspecified severity, with anxiety: Secondary | ICD-10-CM | POA: Diagnosis present

## 2023-01-26 DIAGNOSIS — G40909 Epilepsy, unspecified, not intractable, without status epilepticus: Secondary | ICD-10-CM | POA: Diagnosis present

## 2023-01-26 DIAGNOSIS — E1122 Type 2 diabetes mellitus with diabetic chronic kidney disease: Secondary | ICD-10-CM | POA: Diagnosis present

## 2023-01-26 LAB — CBC WITH DIFFERENTIAL/PLATELET
Abs Immature Granulocytes: 0 10*3/uL (ref 0.00–0.07)
Basophils Absolute: 0 10*3/uL (ref 0.0–0.1)
Basophils Relative: 0 %
Eosinophils Absolute: 0 10*3/uL (ref 0.0–0.5)
Eosinophils Relative: 0 %
HCT: 39.2 % (ref 39.0–52.0)
Hemoglobin: 11.9 g/dL — ABNORMAL LOW (ref 13.0–17.0)
Lymphocytes Relative: 1 %
Lymphs Abs: 0.1 10*3/uL — ABNORMAL LOW (ref 0.7–4.0)
MCH: 27.7 pg (ref 26.0–34.0)
MCHC: 30.4 g/dL (ref 30.0–36.0)
MCV: 91.2 fL (ref 80.0–100.0)
Monocytes Absolute: 0.1 10*3/uL (ref 0.1–1.0)
Monocytes Relative: 1 %
Neutro Abs: 9.3 10*3/uL — ABNORMAL HIGH (ref 1.7–7.7)
Neutrophils Relative %: 98 %
Platelets: 227 10*3/uL (ref 150–400)
RBC: 4.3 MIL/uL (ref 4.22–5.81)
RDW: 16.9 % — ABNORMAL HIGH (ref 11.5–15.5)
WBC: 9.5 10*3/uL (ref 4.0–10.5)
nRBC: 0 % (ref 0.0–0.2)
nRBC: 0 /100 WBC

## 2023-01-26 LAB — BASIC METABOLIC PANEL
Anion gap: 13 (ref 5–15)
Anion gap: 9 (ref 5–15)
BUN: 17 mg/dL (ref 8–23)
BUN: 21 mg/dL (ref 8–23)
CO2: 18 mmol/L — ABNORMAL LOW (ref 22–32)
CO2: 19 mmol/L — ABNORMAL LOW (ref 22–32)
Calcium: 8.7 mg/dL — ABNORMAL LOW (ref 8.9–10.3)
Calcium: 8.2 mg/dL — ABNORMAL LOW (ref 8.9–10.3)
Chloride: 112 mmol/L — ABNORMAL HIGH (ref 98–111)
Chloride: 114 mmol/L — ABNORMAL HIGH (ref 98–111)
Creatinine, Ser: 1.39 mg/dL — ABNORMAL HIGH (ref 0.61–1.24)
Creatinine, Ser: 1.38 mg/dL — ABNORMAL HIGH (ref 0.61–1.24)
GFR, Estimated: 49 mL/min — ABNORMAL LOW (ref >60)
GFR, Estimated: 50 mL/min — ABNORMAL LOW (ref >60)
Glucose, Bld: 153 mg/dL — ABNORMAL HIGH (ref 70–99)
Glucose, Bld: 132 mg/dL — ABNORMAL HIGH (ref 70–99)
Potassium: 3.2 mmol/L — ABNORMAL LOW (ref 3.5–5.1)
Potassium: 3.3 mmol/L — ABNORMAL LOW (ref 3.5–5.1)
Sodium: 143 mmol/L (ref 135–145)
Sodium: 142 mmol/L (ref 135–145)

## 2023-01-26 LAB — LACTIC ACID, PLASMA
Lactic Acid, Venous: 3.6 mmol/L (ref 0.5–1.9)
Lactic Acid, Venous: 4 mmol/L (ref 0.5–1.9)

## 2023-01-26 LAB — URINALYSIS, ROUTINE W REFLEX MICROSCOPIC
Bilirubin Urine: NEGATIVE
Glucose, UA: NEGATIVE mg/dL
Ketones, ur: 5 mg/dL — AB
Nitrite: POSITIVE — AB
Protein, ur: NEGATIVE mg/dL
RBC / HPF: >50 RBC/hpf (ref 0–5)
Specific Gravity, Urine: 1.019 (ref 1.005–1.030)
WBC, UA: >50 WBC/hpf (ref 0–5)
pH: 5 (ref 5.0–8.0)

## 2023-01-26 LAB — HEMOGLOBIN A1C
Hgb A1c MFr Bld: 6.8 % — ABNORMAL HIGH (ref 4.8–5.6)
Mean Plasma Glucose: 148 mg/dL

## 2023-01-26 LAB — GLUCOSE, CAPILLARY
Glucose-Capillary: 149 mg/dL — ABNORMAL HIGH (ref 70–99)
Glucose-Capillary: 163 mg/dL — ABNORMAL HIGH (ref 70–99)
Glucose-Capillary: 149 mg/dL — ABNORMAL HIGH (ref 70–99)
Glucose-Capillary: 154 mg/dL — ABNORMAL HIGH (ref 70–99)

## 2023-01-26 LAB — BRAIN NATRIURETIC PEPTIDE: B Natriuretic Peptide: 923 pg/mL — ABNORMAL HIGH (ref 0.0–100.0)

## 2023-01-26 LAB — ECHOCARDIOGRAM COMPLETE
Area-P 1/2: 4.41 cm2
Height: 70 in
S' Lateral: 2.6 cm
Weight: 3040 oz

## 2023-01-26 LAB — MAGNESIUM
Magnesium: 1.7 mg/dL (ref 1.7–2.4)
Magnesium: 2 mg/dL (ref 1.7–2.4)

## 2023-01-26 LAB — PROTIME-INR
INR: 7 (ref 0.8–1.2)
Prothrombin Time: 59.7 seconds — ABNORMAL HIGH (ref 11.4–15.2)

## 2023-01-26 LAB — STREP PNEUMONIAE URINARY ANTIGEN: Strep Pneumo Urinary Antigen: NEGATIVE

## 2023-01-26 LAB — TROPONIN I (HIGH SENSITIVITY): Troponin I (High Sensitivity): 135 ng/L (ref <18)

## 2023-01-26 LAB — PROCALCITONIN: Procalcitonin: 47.3 ng/mL

## 2023-01-26 LAB — MRSA NEXT GEN BY PCR, NASAL: MRSA by PCR Next Gen: DETECTED — AB

## 2023-01-26 LAB — CORTISOL: Cortisol, Plasma: 65.9 ug/dL

## 2023-01-26 LAB — CK: Total CK: 573 U/L — ABNORMAL HIGH (ref 49–397)

## 2023-01-26 MED ORDER — MUPIROCIN 2 % EX OINT
1.0000 | TOPICAL_OINTMENT | Freq: Two times a day (BID) | CUTANEOUS | Status: AC
  Administered 2023-01-26 – 2023-01-30 (×10): 1 via NASAL
  Filled 2023-01-26 (×2): qty 22

## 2023-01-26 MED ORDER — NOREPINEPHRINE 4 MG/250ML-% IV SOLN
0.0000 ug/min | INTRAVENOUS | Status: DC
  Administered 2023-01-26: 5 ug/min via INTRAVENOUS
  Administered 2023-01-26: 2 ug/min via INTRAVENOUS
  Administered 2023-01-27: 8 ug/min via INTRAVENOUS
  Filled 2023-01-26 (×3): qty 250

## 2023-01-26 MED ORDER — VANCOMYCIN HCL IN DEXTROSE 1-5 GM/200ML-% IV SOLN
1000.0000 mg | INTRAVENOUS | Status: DC
  Administered 2023-01-27: 1000 mg via INTRAVENOUS
  Filled 2023-01-26: qty 200

## 2023-01-26 MED ORDER — POLYETHYLENE GLYCOL 3350 17 G PO PACK: 17.0000 g | PACK | Freq: Every day | ORAL | Status: DC | PRN

## 2023-01-26 MED ORDER — ORAL CARE MOUTH RINSE: 15.0000 mL | OROMUCOSAL | Status: DC | PRN

## 2023-01-26 MED ORDER — SODIUM CHLORIDE 0.9 % IV BOLUS: 1000.0000 mL | Freq: Once | INTRAVENOUS | Status: DC

## 2023-01-26 MED ORDER — SODIUM CHLORIDE 0.9 % IV SOLN
250.0000 mg | Freq: Two times a day (BID) | INTRAVENOUS | Status: DC
  Administered 2023-01-26 – 2023-02-02 (×15): 250 mg via INTRAVENOUS
  Filled 2023-01-26 (×18): qty 2.5

## 2023-01-26 MED ORDER — SENNOSIDES-DOCUSATE SODIUM 8.6-50 MG PO TABS: 1.0000 | ORAL_TABLET | Freq: Every evening | ORAL | Status: DC | PRN

## 2023-01-26 MED ORDER — SODIUM CHLORIDE 0.9 % IV SOLN
1.0000 g | Freq: Once | INTRAVENOUS | Status: AC
  Administered 2023-01-26: 1 g via INTRAVENOUS
  Filled 2023-01-26: qty 10

## 2023-01-26 MED ORDER — INSULIN ASPART 100 UNIT/ML IJ SOLN
0.0000 [IU] | INTRAMUSCULAR | Status: DC
  Administered 2023-01-26: 3 [IU] via SUBCUTANEOUS
  Administered 2023-01-26 (×2): 2 [IU] via SUBCUTANEOUS
  Administered 2023-01-26: 3 [IU] via SUBCUTANEOUS
  Administered 2023-01-27 – 2023-01-29 (×3): 2 [IU] via SUBCUTANEOUS

## 2023-01-26 MED ORDER — CHLORHEXIDINE GLUCONATE CLOTH 2 % EX PADS
6.0000 | MEDICATED_PAD | Freq: Every day | CUTANEOUS | Status: DC
  Administered 2023-01-26 – 2023-01-30 (×5): 6 via TOPICAL

## 2023-01-26 MED ORDER — MEDIHONEY WOUND/BURN DRESSING EX PSTE
1.0000 | PASTE | Freq: Every day | CUTANEOUS | Status: DC
  Administered 2023-01-26 – 2023-02-02 (×8): 1 via TOPICAL
  Filled 2023-01-26 (×2): qty 44

## 2023-01-26 MED ORDER — POTASSIUM CHLORIDE 10 MEQ/100ML IV SOLN
10.0000 meq | INTRAVENOUS | Status: AC
  Administered 2023-01-26 (×2): 10 meq via INTRAVENOUS
  Filled 2023-01-26 (×2): qty 100

## 2023-01-26 MED ORDER — SODIUM CHLORIDE 0.9 % IV BOLUS: 500.0000 mL | Freq: Once | INTRAVENOUS | Status: DC

## 2023-01-26 MED ORDER — ONDANSETRON HCL 4 MG/2ML IJ SOLN: 4.0000 mg | Freq: Four times a day (QID) | INTRAMUSCULAR | Status: DC | PRN

## 2023-01-26 MED ORDER — FAMOTIDINE IN NACL 20-0.9 MG/50ML-% IV SOLN
20.0000 mg | INTRAVENOUS | Status: DC
  Administered 2023-01-26 – 2023-01-28 (×3): 20 mg via INTRAVENOUS
  Filled 2023-01-26 (×3): qty 50

## 2023-01-26 MED ORDER — CHLORHEXIDINE GLUCONATE CLOTH 2 % EX PADS
6.0000 | MEDICATED_PAD | Freq: Every day | CUTANEOUS | Status: DC
  Administered 2023-01-27 – 2023-01-30 (×3): 6 via TOPICAL

## 2023-01-26 MED ORDER — PIPERACILLIN-TAZOBACTAM 3.375 G IVPB
3.3750 g | Freq: Three times a day (TID) | INTRAVENOUS | Status: DC
  Administered 2023-01-26 – 2023-01-27 (×4): 3.375 g via INTRAVENOUS
  Filled 2023-01-26 (×5): qty 50

## 2023-01-26 MED ORDER — AMIODARONE IV BOLUS ONLY 150 MG/100ML
150.0000 mg | Freq: Once | INTRAVENOUS | Status: AC
  Administered 2023-01-26: 150 mg via INTRAVENOUS
  Filled 2023-01-26: qty 100

## 2023-01-26 MED ORDER — ACETAMINOPHEN 650 MG RE SUPP
650.0000 mg | Freq: Four times a day (QID) | RECTAL | Status: DC | PRN
  Administered 2023-01-26: 650 mg via RECTAL
  Filled 2023-01-26: qty 1

## 2023-01-26 MED ORDER — DOCUSATE SODIUM 100 MG PO CAPS: 100.0000 mg | ORAL_CAPSULE | Freq: Two times a day (BID) | ORAL | Status: DC | PRN

## 2023-01-26 MED ORDER — SODIUM CHLORIDE 0.9 % IV BOLUS
500.0000 mL | Freq: Once | INTRAVENOUS | Status: AC
  Administered 2023-01-26: 500 mL via INTRAVENOUS

## 2023-01-26 MED ORDER — VANCOMYCIN HCL 1250 MG/250ML IV SOLN: 1250.0000 mg | INTRAVENOUS | Status: DC

## 2023-01-26 MED ORDER — POTASSIUM CHLORIDE 10 MEQ/100ML IV SOLN
10.0000 meq | INTRAVENOUS | Status: AC
  Administered 2023-01-27 (×6): 10 meq via INTRAVENOUS
  Filled 2023-01-26 (×6): qty 100

## 2023-01-26 MED ORDER — MAGNESIUM SULFATE 2 GM/50ML IV SOLN
2.0000 g | Freq: Once | INTRAVENOUS | Status: AC
  Administered 2023-01-26: 2 g via INTRAVENOUS
  Filled 2023-01-26: qty 50

## 2023-01-26 MED ORDER — LACTATED RINGERS IV SOLN: INTRAVENOUS | Status: AC

## 2023-01-26 MED ORDER — POTASSIUM CHLORIDE 10 MEQ/100ML IV SOLN
10.0000 meq | INTRAVENOUS | Status: AC
  Administered 2023-01-26 (×3): 10 meq via INTRAVENOUS
  Filled 2023-01-26 (×3): qty 100

## 2023-01-26 MED ORDER — SODIUM CHLORIDE 0.9 % IV SOLN: 2.0000 g | INTRAVENOUS | Status: DC

## 2023-01-26 MED ORDER — VANCOMYCIN HCL 1500 MG/300ML IV SOLN
1500.0000 mg | Freq: Once | INTRAVENOUS | Status: DC
  Filled 2023-01-26: qty 300

## 2023-01-26 MED ORDER — VITAMIN K1 10 MG/ML IJ SOLN
2.5000 mg | Freq: Once | INTRAVENOUS | Status: AC
  Administered 2023-01-26: 2.5 mg via INTRAVENOUS
  Filled 2023-01-26: qty 0.25

## 2023-01-26 MED ORDER — ONDANSETRON HCL 4 MG PO TABS: 4.0000 mg | ORAL_TABLET | Freq: Four times a day (QID) | ORAL | Status: DC | PRN

## 2023-01-26 MED ORDER — ALBUMIN HUMAN 5 % IV SOLN
25.0000 g | Freq: Once | INTRAVENOUS | Status: AC
  Administered 2023-01-26: 25 g via INTRAVENOUS
  Filled 2023-01-26: qty 500

## 2023-01-26 MED ORDER — ACETAMINOPHEN 325 MG PO TABS: 650.0000 mg | ORAL_TABLET | Freq: Four times a day (QID) | ORAL | Status: DC | PRN

## 2023-01-26 MED ORDER — VANCOMYCIN HCL 1750 MG/350ML IV SOLN
1750.0000 mg | Freq: Once | INTRAVENOUS | Status: AC
  Administered 2023-01-26: 1750 mg via INTRAVENOUS
  Filled 2023-01-26: qty 350

## 2023-01-26 MED ORDER — ORAL CARE MOUTH RINSE
15.0000 mL | OROMUCOSAL | Status: DC
  Administered 2023-01-26 – 2023-01-30 (×15): 15 mL via OROMUCOSAL

## 2023-01-26 MED ORDER — LACTATED RINGERS IV BOLUS
1000.0000 mL | Freq: Once | INTRAVENOUS | Status: AC
  Administered 2023-01-26: 1000 mL via INTRAVENOUS

## 2023-01-26 NOTE — Consult Note (Signed)
Urology Consult   Physician requesting consult: Tegeler, Gwenyth Allegra, MD   Reason for consult: Urinary retention, difficulty Foley   History of Present Illness: Robert Fisher is a 89 y.o. male who apparently suffered a fall resulting in no major injuries.  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  The ER team made multiple attempts to catheterize the patient after he was found to have an elevated bladder scan but were unsuccessful and ultimately lead to gross hematuria.  Urology has been consulted for catheter placement.  The patient is disoriented x3 and has no family at bedside.     Past Medical History:  Diagnosis Date   Borderline diabetes    takes Metformin   Cancer ([place])    lung 2020 per spouse LUL   COPD (chronic obstructive pulmonary disease) ([place])    Diabetes mellitus without complication ([place])    DVT (deep venous thrombosis) ([place])    Full dentures    Hearing aid worn    B/L   HOH (hard of hearing)    Hypertension    Left upper lobe pulmonary nodule    Memory problem    Pneumonia    Schizo affective schizophrenia ([place])    Seizure ([place]) 05/21/2022   Seizures ([place])     Past Surgical History:  Procedure Laterality Date   cancer removal     legs   CATARACT EXTRACTION     COLONOSCOPY     FUDUCIAL PLACEMENT N/A 03/09/2019   Procedure: PLACEMENT OF FUDUCIAL;  Surgeon: [name], MD;  Location: [facility];  Service: Thoracic;  Laterality: N/A;   IR CHOLANGIOGRAM EXISTING TUBE  09/23/2022   IR EXCHANGE BILIARY DRAIN  07/08/2022   IR EXCHANGE BILIARY DRAIN  08/01/2022   IR EXCHANGE BILIARY DRAIN  08/28/2022   IR EXCHANGE BILIARY DRAIN  09/03/2022   IR PERC CHOLECYSTOSTOMY  05/23/2022   IR RADIOLOGIST EVAL & MGMT  07/11/2022   IR RADIOLOGIST EVAL & MGMT  09/10/2022   IR REMOVAL BILIARY DRAIN  09/23/2022   IR REMOVAL OF CALCULI/DEBRIS BILIARY DUCT/GB  08/01/2022   THROMBECTOMY     VIDEO BRONCHOSCOPY WITH ENDOBRONCHIAL NAVIGATION N/A  03/09/2019   Procedure: VIDEO BRONCHOSCOPY WITH ENDOBRONCHIAL NAVIGATION;  Surgeon: [name], MD;  Location: [facility];  Service: Thoracic;  Laterality: N/A;    Current Hospital Medications:  Home Meds:  No outpatient medications have been marked as taking for the 01/25/23 encounter [facility] Encounter).    Scheduled Meds: Continuous Infusions:  lactated ringers 100 mL/hr at 01/26/23 0528   levETIRAcetam Stopped (01/26/23 0651)   norepinephrine (LEVOPHED) Adult infusion 3 mcg/min (01/26/23 0658)   piperacillin-tazobactam (ZOSYN)  IV     [START ON 01/28/2023] vancomycin     vancomycin     PRN Meds:.acetaminophen **OR** acetaminophen, ondansetron **OR** ondansetron (ZOFRAN) IV, senna-docusate  Allergies:  Allergies  Allergen Reactions   Haloperidol Lactate Other (See Comments)    "Made him climb the walls."   Carbamazepine Other (See Comments)    Caused seizures    Sildenafil Other (See Comments)    "pain in right thigh."    History reviewed. No pertinent family history.  Social History:  reports that he quit smoking about 7 years ago. His smoking use included cigarettes and pipe. He started smoking about 70 years ago. He has a 50.00 pack-year smoking history. He has never used smokeless tobacco. He reports that he does not drink alcohol and does not use drugs.  ROS: Unable to  be obtained.  Physical Exam:  Vital signs in last 24 hours: Temp:  [98.5 F (36.9 C)-101.4 F (38.6 C)] 100.4 F (38 C) (02/26 0613) Pulse Rate:  [66-118] 91 (02/26 0700) Resp:  [12-29] 20 (02/26 0700) BP: (77-154)/(36-100) 90/45 (02/26 0700) SpO2:  [92 %-100 %] 98 % (02/26 0700) Weight:  [86.2 kg] 86.2 kg (02/25 1109) Constitutional:  Disoriented x3 GU:  Penis is uncircumcised with blood at the urethral meatus.   Laboratory Data:  Recent Labs    01/25/23 1033 01/25/23 2219 01/26/23 0329  WBC 9.9 2.4* 9.5  HGB 13.8 12.6* 11.9*  HCT 46.2 40.2 39.2  PLT 306 223 227    Recent  Labs    01/25/23 1033 01/25/23 2324 01/26/23 0329  NA 141 142 143  K 3.6 3.1* 3.2*  CL 109 111 112*  GLUCOSE 190* 119* 153*  BUN '18 14 17  '$ CALCIUM 9.5 8.7* 8.7*  CREATININE 1.22 1.17 1.39*     Results for orders placed or performed during the hospital encounter of 01/25/23 (from the past 24 hour(s))  Comprehensive metabolic panel     Status: Abnormal   Collection Time: 01/25/23 10:33 AM  Result Value Ref Range   Sodium 141 135 - 145 mmol/L   Potassium 3.6 3.5 - 5.1 mmol/L   Chloride 109 98 - 111 mmol/L   CO2 20 (L) 22 - 32 mmol/L   Glucose, Bld 190 (H) 70 - 99 mg/dL   BUN 18 8 - 23 mg/dL   Creatinine, Ser 1.22 0.61 - 1.24 mg/dL   Calcium 9.5 8.9 - 10.3 mg/dL   Total Protein 7.0 6.5 - 8.1 g/dL   Albumin 3.3 (L) 3.5 - 5.0 g/dL   AST 30 15 - 41 U/L   ALT 17 0 - 44 U/L   Alkaline Phosphatase 41 38 - 126 U/L   Total Bilirubin 0.8 0.3 - 1.2 mg/dL   GFR, Estimated 58 (L) >60 mL/min   Anion gap 12 5 - 15  CBC     Status: Abnormal   Collection Time: 01/25/23 10:33 AM  Result Value Ref Range   WBC 9.9 4.0 - 10.5 K/uL   RBC 4.98 4.22 - 5.81 MIL/uL   Hemoglobin 13.8 13.0 - 17.0 g/dL   HCT 46.2 39.0 - 52.0 %   MCV 92.8 80.0 - 100.0 fL   MCH 27.7 26.0 - 34.0 pg   MCHC 29.9 (L) 30.0 - 36.0 g/dL   RDW 16.5 (H) 11.5 - 15.5 %   Platelets 306 150 - 400 K/uL   nRBC 0.0 0.0 - 0.2 %  Protime-INR     Status: Abnormal   Collection Time: 01/25/23 10:33 AM  Result Value Ref Range   Prothrombin Time 43.2 (H) 11.4 - 15.2 seconds   INR 4.6 (HH) 0.8 - 1.2  CK     Status: Abnormal   Collection Time: 01/25/23 10:33 AM  Result Value Ref Range   Total CK 558 (H) 49 - 397 U/L  Lactic acid, plasma     Status: None   Collection Time: 01/25/23 10:34 AM  Result Value Ref Range   Lactic Acid, Venous 1.5 0.5 - 1.9 mmol/L  CBC with Differential     Status: Abnormal   Collection Time: 01/25/23 10:19 PM  Result Value Ref Range   WBC 2.4 (L) 4.0 - 10.5 K/uL   RBC 4.42 4.22 - 5.81 MIL/uL    Hemoglobin 12.6 (L) 13.0 - 17.0 g/dL   HCT 40.2 39.0 - 52.0 %  MCV 91.0 80.0 - 100.0 fL   MCH 28.5 26.0 - 34.0 pg   MCHC 31.3 30.0 - 36.0 g/dL   RDW 16.4 (H) 11.5 - 15.5 %   Platelets 223 150 - 400 K/uL   nRBC 0.0 0.0 - 0.2 %   Neutrophils Relative % 65 %   Neutro Abs 1.6 (L) 1.7 - 7.7 K/uL   Lymphocytes Relative 31 %   Lymphs Abs 0.7 0.7 - 4.0 K/uL   Monocytes Relative 2 %   Monocytes Absolute 0.0 (L) 0.1 - 1.0 K/uL   Eosinophils Relative 1 %   Eosinophils Absolute 0.0 0.0 - 0.5 K/uL   Basophils Relative 1 %   Basophils Absolute 0.0 0.0 - 0.1 K/uL   Immature Granulocytes 0 %   Abs Immature Granulocytes 0.00 0.00 - 0.07 K/uL  Urinalysis, Routine w reflex microscopic -Urine, Clean Catch     Status: Abnormal   Collection Time: 01/25/23 11:22 PM  Result Value Ref Range   Color, Urine YELLOW YELLOW   APPearance HAZY (A) CLEAR   Specific Gravity, Urine 1.019 1.005 - 1.030   pH 5.0 5.0 - 8.0   Glucose, UA NEGATIVE NEGATIVE mg/dL   Hgb urine dipstick LARGE (A) NEGATIVE   Bilirubin Urine NEGATIVE NEGATIVE   Ketones, ur 5 (A) NEGATIVE mg/dL   Protein, ur NEGATIVE NEGATIVE mg/dL   Nitrite POSITIVE (A) NEGATIVE   Leukocytes,Ua MODERATE (A) NEGATIVE   RBC / HPF >50 0 - 5 RBC/hpf   WBC, UA >50 0 - 5 WBC/hpf   Bacteria, UA MANY (A) NONE SEEN   Squamous Epithelial / HPF 0-5 0 - 5 /HPF  Basic metabolic panel     Status: Abnormal   Collection Time: 01/25/23 11:24 PM  Result Value Ref Range   Sodium 142 135 - 145 mmol/L   Potassium 3.1 (L) 3.5 - 5.1 mmol/L   Chloride 111 98 - 111 mmol/L   CO2 20 (L) 22 - 32 mmol/L   Glucose, Bld 119 (H) 70 - 99 mg/dL   BUN 14 8 - 23 mg/dL   Creatinine, Ser 1.17 0.61 - 1.24 mg/dL   Calcium 8.7 (L) 8.9 - 10.3 mg/dL   GFR, Estimated >60 >60 mL/min   Anion gap 11 5 - 15  CK     Status: Abnormal   Collection Time: 01/26/23  2:00 AM  Result Value Ref Range   Total CK 573 (H) 49 - 397 U/L  Basic metabolic panel     Status: Abnormal   Collection Time:  01/26/23  3:29 AM  Result Value Ref Range   Sodium 143 135 - 145 mmol/L   Potassium 3.2 (L) 3.5 - 5.1 mmol/L   Chloride 112 (H) 98 - 111 mmol/L   CO2 18 (L) 22 - 32 mmol/L   Glucose, Bld 153 (H) 70 - 99 mg/dL   BUN 17 8 - 23 mg/dL   Creatinine, Ser 1.39 (H) 0.61 - 1.24 mg/dL   Calcium 8.7 (L) 8.9 - 10.3 mg/dL   GFR, Estimated 49 (L) >60 mL/min   Anion gap 13 5 - 15  CBC with Differential/Platelet     Status: Abnormal   Collection Time: 01/26/23  3:29 AM  Result Value Ref Range   WBC 9.5 4.0 - 10.5 K/uL   RBC 4.30 4.22 - 5.81 MIL/uL   Hemoglobin 11.9 (L) 13.0 - 17.0 g/dL   HCT 39.2 39.0 - 52.0 %   MCV 91.2 80.0 - 100.0 fL   MCH 27.7  26.0 - 34.0 pg   MCHC 30.4 30.0 - 36.0 g/dL   RDW 16.9 (H) 11.5 - 15.5 %   Platelets 227 150 - 400 K/uL   nRBC 0.0 0.0 - 0.2 %   Neutrophils Relative % 98 %   Neutro Abs 9.3 (H) 1.7 - 7.7 K/uL   Lymphocytes Relative 1 %   Lymphs Abs 0.1 (L) 0.7 - 4.0 K/uL   Monocytes Relative 1 %   Monocytes Absolute 0.1 0.1 - 1.0 K/uL   Eosinophils Relative 0 %   Eosinophils Absolute 0.0 0.0 - 0.5 K/uL   Basophils Relative 0 %   Basophils Absolute 0.0 0.0 - 0.1 K/uL   nRBC 0 0 /100 WBC   Abs Immature Granulocytes 0.00 0.00 - 0.07 K/uL  Magnesium     Status: None   Collection Time: 01/26/23  6:29 AM  Result Value Ref Range   Magnesium 1.7 1.7 - 2.4 mg/dL   No results found for this or any previous visit (from the past 240 hour(s)).  Renal Function: Recent Labs    01/25/23 1033 01/25/23 2324 01/26/23 0329  CREATININE 1.22 1.17 1.39*   Estimated Creatinine Clearance: 39.4 mL/min (A) (by C-G formula based on SCr of 1.39 mg/dL (H)).  Radiologic Imaging: CT CERVICAL SPINE WO CONTRAST  Result Date: 01/25/2023 CLINICAL DATA:  Trauma EXAM: CT HEAD WITHOUT CONTRAST CT CERVICAL SPINE WITHOUT CONTRAST TECHNIQUE: Multidetector CT imaging of the head and cervical spine was performed following the standard protocol without intravenous contrast. Multiplanar CT  image reconstructions of the cervical spine were also generated. RADIATION DOSE REDUCTION: This exam was performed according to the departmental dose-optimization program which includes automated exposure control, adjustment of the mA and/or kV according to patient size and/or use of iterative reconstruction technique. COMPARISON:  CT C Spine and Head 04/03/22 FINDINGS: CT HEAD FINDINGS Brain: No evidence of acute infarction, hemorrhage, hydrocephalus, extra-axial collection or mass lesion/mass effect. Sequela of moderate to severe chronic microvascular ischemic change. Generalized volume loss. Vascular: No disproportionately hyperdense vessel or unexpected calcification. Skull: Normal. Negative for fracture or focal lesion. Radiodensities along the frontal scalp are favored to represent dermal calcifications. Mild soft tissue swelling along the left parietal scalp. Sinuses/Orbits: Bilateral lens replacement. No middle ear or mastoid effusion. There are frothy secretions in the right sphenoid sinus. Other: None. CT CERVICAL SPINE FINDINGS Alignment: There is trace anterolisthesis of C3 on C4. Trace retrolisthesis of C6 onC7. Skull base and vertebrae: No acute fracture. No primary bone lesion or focal pathologic process. There are multilevel degenerative endplate changes with osseous fusion of C5-C6 and severe disc space loss at C6-C7. Soft tissues and spinal canal: No prevertebral fluid or swelling. No visible canal hematoma. Disc levels:  No evidence of high-grade spinal canal stenosis. Upper chest: There fissural fluid on the left.  No pneumothorax. Other: None IMPRESSION: 1. No CT evidence of intracranial injury. 2. Mild soft tissue swelling along the left parietal scalp. No evidence of underlying calvarial fracture. 3. No acute cervical spine fracture. Electronically Signed   By: Marin Roberts M.D.   On: 01/25/2023 12:27   CT HEAD WO CONTRAST  Result Date: 01/25/2023 CLINICAL DATA:  Trauma EXAM: CT HEAD  WITHOUT CONTRAST CT CERVICAL SPINE WITHOUT CONTRAST TECHNIQUE: Multidetector CT imaging of the head and cervical spine was performed following the standard protocol without intravenous contrast. Multiplanar CT image reconstructions of the cervical spine were also generated. RADIATION DOSE REDUCTION: This exam was performed according to the departmental dose-optimization program  which includes automated exposure control, adjustment of the mA and/or kV according to patient size and/or use of iterative reconstruction technique. COMPARISON:  CT C Spine and Head 04/03/22 FINDINGS: CT HEAD FINDINGS Brain: No evidence of acute infarction, hemorrhage, hydrocephalus, extra-axial collection or mass lesion/mass effect. Sequela of moderate to severe chronic microvascular ischemic change. Generalized volume loss. Vascular: No disproportionately hyperdense vessel or unexpected calcification. Skull: Normal. Negative for fracture or focal lesion. Radiodensities along the frontal scalp are favored to represent dermal calcifications. Mild soft tissue swelling along the left parietal scalp. Sinuses/Orbits: Bilateral lens replacement. No middle ear or mastoid effusion. There are frothy secretions in the right sphenoid sinus. Other: None. CT CERVICAL SPINE FINDINGS Alignment: There is trace anterolisthesis of C3 on C4. Trace retrolisthesis of C6 onC7. Skull base and vertebrae: No acute fracture. No primary bone lesion or focal pathologic process. There are multilevel degenerative endplate changes with osseous fusion of C5-C6 and severe disc space loss at C6-C7. Soft tissues and spinal canal: No prevertebral fluid or swelling. No visible canal hematoma. Disc levels:  No evidence of high-grade spinal canal stenosis. Upper chest: There fissural fluid on the left.  No pneumothorax. Other: None IMPRESSION: 1. No CT evidence of intracranial injury. 2. Mild soft tissue swelling along the left parietal scalp. No evidence of underlying calvarial  fracture. 3. No acute cervical spine fracture. Electronically Signed   By: Marin Roberts M.D.   On: 01/25/2023 12:27   DG Tibia/Fibula Left Port  Result Date: 01/25/2023 CLINICAL DATA:  Trauma, sore on posterior mid LEFT tib-fib. EXAM: PORTABLE LEFT TIBIA AND FIBULA - 2 VIEW COMPARISON:  None Available. FINDINGS: No acute findings. No evidence of fracture line or acutely displaced fracture fragment. No acute-appearing cortical irregularity or osseous lesion. Degenerative osteoarthritic changes at the LEFT knee, at least moderate in degree with associated osteophyte formation. Soft tissue lucency overlying the distal LEFT tib-fib, presumably corresponding to the sore described in the clinical data. No evidence of soft tissue gas elsewhere in the LEFT calf. IMPRESSION: 1. Soft tissue lucency overlying the distal LEFT tib-fib, presumably corresponding to the sore described in the clinical data. No evidence of underlying osteomyelitis or fracture. 2. No acute osseous abnormality. Electronically Signed   By: Franki Cabot M.D.   On: 01/25/2023 11:09   DG Pelvis Portable  Result Date: 01/25/2023 CLINICAL DATA:  89 year old male status post fall. EXAM: PORTABLE PELVIS 1-2 VIEWS COMPARISON:  CT Abdomen and Pelvis 08/26/2022. FINDINGS: Portable AP supine view at 1040 hours. Chronic right inguinal surgical clips. Chronic bilateral acetabulum spurring. Bone mineralization is normal for age. Femoral heads remain normally located. Pelvis appears stable and intact. Grossly intact proximal femurs. Negative visible bowel gas. Chronic lumbosacral junction disc and endplate degeneration. IMPRESSION: No acute fracture or dislocation identified about the pelvis. Electronically Signed   By: Genevie Ann M.D.   On: 01/25/2023 11:08   DG Chest Port 1 View  Result Date: 01/25/2023 CLINICAL DATA:  89 year old male with history of trauma. EXAM: PORTABLE CHEST 1 VIEW COMPARISON:  Chest x-ray 08/26/2022.  Chest CT 11/19/2022. FINDINGS:  Chronic mass-like area of architectural distortion in the left upper lobe, with fiducial markers in this region, similar to the prior radiograph corresponding to areas of postradiation fibrosis on prior chest CT. No acute consolidative airspace disease. Blunting of the left costophrenic sulcus, similar to prior studies, attributable to a prominent fat pad in the lower left hemithorax on prior chest CT. Right lung is clear. No right pleural  effusion. No pneumothorax. No evidence of pulmonary edema. Heart size is normal. The patient is rotated to the left on today's exam, resulting in distortion of the mediastinal contours and reduced diagnostic sensitivity and specificity for mediastinal pathology. Atherosclerotic calcifications in the thoracic aorta. IMPRESSION: 1. Stable radiographic appearance of the chest, as above, without findings to suggest acute cardiopulmonary disease. 2. Aortic atherosclerosis. Electronically Signed   By: Vinnie Langton M.D.   On: 01/25/2023 11:05    I independently reviewed the above imaging studies.  Procedure:  Simple Foley catetherization  Description:  The patient's genitalia were prepped in the usual sterile fashion.  A 14 French Foley catheter was easily inserted into the bladder with return of initially bloody urine that quickly cleared to clear-yellow urine.  Approximate 1000 mL of urine output was obtained after catheterization.  10 mL of sterile water was used to inflate the Foley catheter balloon which was then placed to gravity drainage.  Impression/Recommendation A 89 year old male with gross hematuria secondary to traumatic Foley placement and urinary retention  -14 French Foley catheter easily placed and set to gravity drainage.  Recommend keeping Foley catheter in for at least 5 days.  Call back as needed  Ellison Hughs, MD [facility] 01/26/2023, 7:33 AM

## 2023-01-26 NOTE — Consult Note (Signed)
WOC Nurse Consult Note: Reason for Consult: sacrum and leg pressure injury Patient from home, no one in the room. Patient poor historian. Noted to have blanchable redness and one less than 0.2cm scabbed area on the right upper buttock, not open.  Wound on the left lateral calf, not typically an area of pressure but based on presentation and location and prominence of the leg bone may be pressure, not sure if patient is WC bound or otherwise and this could be a pressure point.  Wound type: Unstageable pressure injury; left lateral calf  Pressure Injury POA: Yes Measurement:1.3cm x 1.0cm x 0.2cm  Wound bed: 100% black/soft  Drainage (amount, consistency, odor) none Periwound: mild erythema  Dressing procedure/placement/frequency: Cleanse LLE wound with saline, pat dry Apply Medihoney to wound bed, top with dry dressing.  Secure with silicone foam  Re-apply Medihoney and gauze daily, ok to lift foam to re-apply. Change foam every 3 days.   Discussed POC with bedside nurse.  Re consult if needed, will not follow at this time. Thanks  Taleshia Luff [employer], RN,CWOCN, CNS, [place] [phone])

## 2023-01-26 NOTE — ED Notes (Signed)
Provider made aware of pts BP; orders obtained and implemented

## 2023-01-26 NOTE — ED Notes (Signed)
Per Dr Claria Dice; stop the LR bolus

## 2023-01-26 NOTE — Progress Notes (Signed)
ANTICOAGULATION CONSULT NOTE - Initial Consult  Pharmacy Consult for Warfarin  Indication: History of VTE  Allergies  Allergen Reactions   Haloperidol Lactate Other (See Comments)    "Made him climb the walls."   Carbamazepine Other (See Comments)    Caused seizures    Sildenafil Other (See Comments)    "pain in right thigh."    Patient Measurements: Height: '5\' 10"'$  (177.8 cm) Weight: 86.2 kg (190 lb) IBW/kg (Calculated) : 73 Heparin Dosing Weight:  Vital Signs: Temp: 101.4 F (38.6 C) (02/26 0333) Temp Source: Axillary (02/26 0333) BP: 109/50 (02/26 0400) Pulse Rate: 104 (02/26 0400)  Labs: Recent Labs    01/25/23 1033 01/25/23 2219 01/25/23 2324 01/26/23 0200 01/26/23 0329  HGB 13.8 12.6*  --   --  11.9*  HCT 46.2 40.2  --   --  39.2  PLT 306 223  --   --  227  LABPROT 43.2*  --   --   --   --   INR 4.6*  --   --   --   --   CREATININE 1.22  --  1.17  --  1.39*  CKTOTAL 558*  --   --  573*  --     Estimated Creatinine Clearance: 39.4 mL/min (A) (by C-G formula based on SCr of 1.39 mg/dL (H)).   Medical History: Past Medical History:  Diagnosis Date   Borderline diabetes    takes Metformin   Cancer ([place])    lung 2020 per spouse LUL   COPD (chronic obstructive pulmonary disease) ([place])    Diabetes mellitus without complication ([place])    DVT (deep venous thrombosis) ([place])    Full dentures    Hearing aid worn    B/L   HOH (hard of hearing)    Hypertension    Left upper lobe pulmonary nodule    Memory problem    Pneumonia    Schizo affective schizophrenia ([place])    Seizure ([place]) 05/21/2022   Seizures ([place])      Assessment: 89 y/o M on warfarin PTA for history of VTE. Here with fall. CT head negative.  INR 4.6  Goal of Therapy:  INR 2-3 Monitor platelets by anticoagulation protocol: Yes   Plan:  Hold warfarin for now Daily PT/INR Resume warfarin as INR allows  Narda Bonds, PharmD, BCPS Clinical Pharmacist Phone: [phone]

## 2023-01-26 NOTE — H&P (Addendum)
NAME:  Robert Fisher, MRN:  [id number], DOB:  03-Apr-1934, LOS: 0 ADMISSION DATE:  01/25/2023, CONSULTATION DATE:  01/26/2023 REFERRING MD:  Claria Dice, CHIEF COMPLAINT:  Hypotension , Acute AMS, Fever with suspected urosepsis   History of Present Illness:  89 year old male with PMHx of dementia, HTN, HLD, DM2, paranoid schizophrenia, seizure disorder, former tobacco abuse, COPD, lung cancer, history of DVT/PE on warfarin and chronic ambulatory dysfunction.  Patient brought to ER 01/25/2023 initially as a trauma patient.  He has chair that will take patient upright to standing position.  Earlier on 2/25  he activated the chair and it threw him forward.  He was cleared by Ortho.  He was noted to have urinary retention, several unsuccessful attempts were made at Foley placement including coud cath.  Patient developed bleeding from his penis along with tenderness.  He is on Coumadin.  Urology was called, Foley was placed.  The patient has dementia, however, the family thought he was a bit more confused compared to baseline.  UA sent from clean cath, is suggestive of infection.  He has been given Rocephin, and admission was requested by the ED. Pt. Is currently on Vancomycin and Zosyn, and Blood / Urine Cx are pending. Patient bedbound at baseline. Urology had to be consulted to place Foley Cath.PCCM were asked to admit as patient is requiring peripheral pressors at 3 mcg/ min  Overnight 2/25-2/26  Pt has remained confused > than baseline and INR and lactate have bumped .He is now on peripheral vasopressors for hypotension that developed overnight. He has received 3.5 L of IVF, still looks dry He has been started on Vancomycin and Zosyn for suspected urosepsis. L calf would could also be a source .   Initial labs in the ED on 2/25 Albumin of 3.3, GFR of 58, Creatinine 1.22  WBC of 9.9 with HGB 13.8 and platelets of 306, INR of 4.6 WBC dropped by 2200 to 2.4/ HGB was 12.6, platelets 223 CK 558, Lactate  1.5, UA was hazy, with large blood and 5 Ketones, Nitrite positive, Moderate leukocytes/ Many bacteria K was 3.1   Labs 2/26 in ED CK had increased to 573, K of 3.2, CO2 of 18 ( from 20), Cl 112 Creatinine 1.39 from 1.22, GFR 49 WBC of 9.2, HGB 11.9, platelets of 227, INR 7 ( from 4.6) , Lactate of 3.6 from 1.5  Mag 1.7  T max 101.4 01/26/2023 CT Tibia/ Fibula Left w/o>>  Soft tissue ulceration posterolaterally in the distal lower leg. Nonspecific generalized subcutaneous edema in the mid to lower leg which may reflect superficial soft tissue infection (cellulitis). No evidence of focal fluid collection, foreign body or soft tissue emphysema. No evidence of osteomyelitis, or acute osseous findings , advanced degenerative changes of the left knee with multiple intra-articular loose bodies and a moderate-sized joint effusion. Diffuse vascular calcifications. 2/25 CT Left Tib Fib >> Soft tissue lucency overlying the distal LEFT tib-fib, presumably corresponding to the sore. No evidence of underlying osteomyelitis or fracture. No acute osseous abnormality 2/25 CT Cervical spine and head >> No evidence of intracranial injury, mild tissue swelling along L parietal scalp, No evidence of underlying calvarial fracture, no acute cervical spine fx. .  Pertinent  Medical History   Past Medical History:  Diagnosis Date   Borderline diabetes    takes Metformin   Cancer ([place])    lung 2020 per spouse LUL   COPD (chronic obstructive pulmonary disease) ([place])    Diabetes mellitus without complication (  [place])    DVT (deep venous thrombosis) ([place])    Full dentures    Hearing aid worn    B/L   HOH (hard of hearing)    Hypertension    Left upper lobe pulmonary nodule    Memory problem    Pneumonia    Schizo affective schizophrenia ([place])    Seizure ([place]) 05/21/2022   Seizures ([place])      Significant Hospital Events: Including procedures, antibiotic start and stop dates in addition to other pertinent  events   2/25 To [facility] for fall, found to have suspected urosepsis 2/26 Admitted to hospital / PCCM consulted 2/26 Zosyn and Vanc started   Interim History / Subjective:  Worsening overnight , now on low dose pressors , lactate bump, CK bump, INR bump . Remains confused    Objective   Blood pressure (!) 90/45, pulse 91, temperature (!) 100.4 F (38 C), temperature source Axillary, resp. rate 20, height '5\' 10"'$  (1.778 m), weight 86.2 kg, SpO2 98 %.        Intake/Output Summary (Last 24 hours) at 01/26/2023 0857 Last data filed at 01/26/2023 0801 Gross per 24 hour  Intake 3116.56 ml  Output --  Net 3116.56 ml   Filed Weights   01/25/23 1109  Weight: 86.2 kg    Examination: General: Elderly male, confused, garbled speech, supine on stretcher  HENT:  [place] AT, No LAD, No JVD, PERRLA, Lungs:  Bilateral chest excursion , clear throughout, no wheeze or crackles noted diminished per bases Cardiovascular: S1, S2, RRR, SR-ST per tele Abdomen:  Soft, NT, ND, BS diminished, Body mass index is 27.26 kg/m.  Extremities: Peripheral IV's , Left calf wound, muscle wasting , warm to touch, slow cap refill Neuro: Confused, MAE x 4, dementia , sleepiness GU: Foley cath with concentrated amber urine   Resolved Hospital Problem list     Assessment & Plan:  Suspected UTI Sepsis vs Left Calf pressure wound / sacral decub Developed Hypotension overnight requiring low dose pressors Lactate climbed from 1.5-3.6 Received 3.5 L IVF since admission  Plan Albumin bolus now as ordered Start LR at 100 cc/hr  Continue Levophed, MAP goal is > 65  Continue Vancomycin and Zosyn as ordered Follow Blood Cx and Urine Cx  Narrow ABX coverage as able once cultures result Check PCT Trend Lactate Cortisol Echo  Trend troponin Trend CK Urine for Strep and Legionella  Wound care RN consulted Hold MWF Lasix 20 mg for now , resume once hemodynamically stable and off pressors Hold home gabapentin for now,  but will need to resume once he is off pressors to prevent withdrawal   AKI 2/2 sepsis [place] Hypokalemia Plan LR at 100 cc per hour Mag 2 gram bolus  KCL in 100 cc's IVF x 3 runs  Trend BMET Replete electrolytes as needed   DM Plan Hold home Metformin and Tradjenta Q 4 CBG SSI  Supra-therapeutic INR on Chronic Coumadin ( Hx of PE) Most likely 2/2 Sepsis Up trended overnight 2/25-2/26 Plan Hold Coumadin for now Vitamin K 2.5 mg IV x 1 dose Trend CBC  Monitor for bleeding   Hematuria 2/2 supra therapeutic INR and foley cath placement  Plan Trend CBC Correct INR  Treat Sepsis  Seizure disorder On Keppra 250  mg BID at home  Plan Keppra 250 mg IV BID  May change to PO dosing once able to take po meds  Dementia with encephalopathy 2/2 sepsis Plan  Tx sepsis  Consider medications  for dementia , as currently on none    Bipolar Disorder/ Schizophrenia  Plan Continue Wellbutrin Hold Home HS Klonopin  Hold Zypreza until more awake and alert   Best Practice (right click and "Reselect all SmartList Selections" daily)   Diet/type: NPO DVT prophylaxis: SCD GI prophylaxis: H2B Lines: peripheral Foley:  N/A Code Status:  DNR Last date of multidisciplinary goals of care discussion [ Pt made DNR by Dr. Quintella Baton 2/26 ] There are no family at bedside 2/26 am  Labs   CBC: Recent Labs  Lab 01/25/23 1033 01/25/23 2219 01/26/23 0329  WBC 9.9 2.4* 9.5  NEUTROABS  --  1.6* 9.3*  HGB 13.8 12.6* 11.9*  HCT 46.2 40.2 39.2  MCV 92.8 91.0 91.2  PLT 306 223 [id number]    Basic Metabolic Panel: Recent Labs  Lab 01/25/23 1033 01/25/23 2324 01/26/23 0329 01/26/23 0629  NA 141 142 143  --   K 3.6 3.1* 3.2*  --   CL 109 111 112*  --   CO2 20* 20* 18*  --   GLUCOSE 190* 119* 153*  --   BUN '18 14 17  '$ --   CREATININE 1.22 1.17 1.39*  --   CALCIUM 9.5 8.7* 8.7*  --   MG  --   --   --  1.7   GFR: Estimated Creatinine Clearance: 39.4 mL/min (A) (by C-G formula  based on SCr of 1.39 mg/dL (H)). Recent Labs  Lab 01/25/23 1033 01/25/23 1034 01/25/23 2219 01/26/23 0329 01/26/23 0629  WBC 9.9  --  2.4* 9.5  --   LATICACIDVEN  --  1.5  --   --  3.6*    Liver Function Tests: Recent Labs  Lab 01/25/23 1033  AST 30  ALT 17  ALKPHOS 41  BILITOT 0.8  PROT 7.0  ALBUMIN 3.3*   No results for input(s): "LIPASE", "AMYLASE" in the last 168 hours. No results for input(s): "AMMONIA" in the last 168 hours.  ABG    Component Value Date/Time   PHART 7.420 11/30/2008 1320   PCO2ART 36.6 11/30/2008 1320   PO2ART 86.6 11/30/2008 1320   HCO3 23.3 11/30/2008 1320   TCO2 19.5 11/30/2008 1320   ACIDBASEDEF 0.2 11/30/2008 1320   O2SAT 96.6 11/30/2008 1335     Coagulation Profile: Recent Labs  Lab 01/25/23 1033 01/26/23 0629  INR 4.6* 7.0*    Cardiac Enzymes: Recent Labs  Lab 01/25/23 1033 01/26/23 0200  CKTOTAL 558* 573*    HbA1C: Hgb A1c MFr Bld  Date/Time Value Ref Range Status  05/20/2022 04:04 AM 6.8 (H) 4.8 - 5.6 % Final    Comment:    (NOTE) Pre diabetes:          5.7%-6.4%  Diabetes:              >6.4%  Glycemic control for   <7.0% adults with diabetes   08/15/2007 04:20 AM   Final   5.7 (NOTE)   The ADA recommends the following therapeutic goals for glycemic   control related to Hgb A1C measurement:   Goal of Therapy:   < 7.0% Hgb A1C   Action Suggested:  > 8.0% Hgb A1C   Ref:  Diabetes Care, 22, Suppl. 1, 1999    CBG: No results for input(s): "GLUCAP" in the last 168 hours.  Review of Systems:    Unable as patient is confused and has dementia. encephalopathy  Past Medical History:  He,  has a past medical history of Borderline diabetes,  Cancer ([place]), COPD (chronic obstructive pulmonary disease) ([place]), Diabetes mellitus without complication ([place]), DVT (deep venous thrombosis) ([place]), Full dentures, Hearing aid worn, HOH (hard of hearing), Hypertension, Left upper lobe pulmonary nodule, Memory problem, Pneumonia,  Schizo affective schizophrenia ([place]), Seizure ([place]) (05/21/2022), and Seizures ([place]).   Surgical History:   Past Surgical History:  Procedure Laterality Date   cancer removal     legs   CATARACT EXTRACTION     COLONOSCOPY     FUDUCIAL PLACEMENT N/A 03/09/2019   Procedure: PLACEMENT OF FUDUCIAL;  Surgeon: [name], MD;  Location: [facility];  Service: Thoracic;  Laterality: N/A;   IR CHOLANGIOGRAM EXISTING TUBE  09/23/2022   IR EXCHANGE BILIARY DRAIN  07/08/2022   IR EXCHANGE BILIARY DRAIN  08/01/2022   IR EXCHANGE BILIARY DRAIN  08/28/2022   IR EXCHANGE BILIARY DRAIN  09/03/2022   IR PERC CHOLECYSTOSTOMY  05/23/2022   IR RADIOLOGIST EVAL & MGMT  07/11/2022   IR RADIOLOGIST EVAL & MGMT  09/10/2022   IR REMOVAL BILIARY DRAIN  09/23/2022   IR REMOVAL OF CALCULI/DEBRIS BILIARY DUCT/GB  08/01/2022   THROMBECTOMY     VIDEO BRONCHOSCOPY WITH ENDOBRONCHIAL NAVIGATION N/A 03/09/2019   Procedure: VIDEO BRONCHOSCOPY WITH ENDOBRONCHIAL NAVIGATION;  Surgeon: [name], MD;  Location: [facility];  Service: Thoracic;  Laterality: N/A;     Social History:   reports that he quit smoking about 7 years ago. His smoking use included cigarettes and pipe. He started smoking about 70 years ago. He has a 50.00 pack-year smoking history. He has never used smokeless tobacco. He reports that he does not drink alcohol and does not use drugs.   Family History:  His family history is not on file.   Allergies Allergies  Allergen Reactions   Haloperidol Lactate Other (See Comments)    "Made him climb the walls."   Carbamazepine Other (See Comments)    Caused seizures    Sildenafil Other (See Comments)    "pain in right thigh."     Home Medications  Prior to Admission medications   Medication Sig Start Date End Date Taking? Authorizing Provider  aspirin EC 81 MG tablet Take 81 mg by mouth at bedtime.    [provider]  atorvastatin (LIPITOR) 40 MG tablet Take 1 tablet (40 mg total) by  mouth at bedtime. 06/03/22   Eugenie Filler, MD  Brimonidine Tartrate (LUMIFY) 0.025 % SOLN Place 1 drop into both eyes daily as needed (dry eyes).    [provider]  buPROPion (WELLBUTRIN) 100 MG tablet Take 100 mg by mouth in the morning.    [provider]  Calcium Carb-Cholecalciferol (CALCIUM 500 + D PO) Take 1 tablet by mouth daily.    [provider]  clonazePAM (KLONOPIN) 0.5 MG tablet Take 0.25 mg by mouth in the morning and at bedtime.    [provider]  Emollient (CERAVE MOISTURIZING EX) Apply 1 application  topically 2 (two) times daily. Apply to bilateral toes-to-calves daily    [provider]  fenofibrate 160 MG tablet Take 160 mg by mouth daily.    [provider]  furosemide (LASIX) 20 MG tablet Take 1 tablet (20 mg total) by mouth See admin instructions. Take 20 mg by mouth in the morning on Mon/Tues/Wed/Fri Patient taking differently: Take 20-40 mg by mouth See admin instructions. Take 20 mg by mouth in the morning on Mon/Tues/Wed/Fri May increase dose to 40 mg twice weekly as needed for swelling 07/23/22  Lorretta Harp, MD  gabapentin (NEURONTIN) 100 MG capsule Take 100 mg by mouth at bedtime.    [provider]  levETIRAcetam (KEPPRA) 500 MG tablet Take 1/2 tablet twice a day 06/20/22   Cameron Sprang, MD  linagliptin (TRADJENTA) 5 MG TABS tablet Take 5 mg by mouth every other day.    [provider]  liver oil-zinc oxide (DESITIN) 40 % ointment Apply 1 Application topically as needed for irritation.    [provider]  metFORMIN (GLUCOPHAGE) 500 MG tablet Take 500 mg by mouth in the morning and at bedtime.    [provider]  metoprolol tartrate (LOPRESSOR) 25 MG tablet Take 25 mg by mouth daily.     [provider]  Multiple Vitamins-Minerals (MULTIVITAMIN GUMMIES MENS) CHEW Chew 1 tablet by mouth daily.    [provider]  Nystatin (GERHARDT'S BUTT CREAM) CREA  Apply 1 Application topically 2 (two) times daily. 08/29/22   Nita Sells, MD  OLANZapine (ZYPREXA) 10 MG tablet Take 20 mg by mouth at bedtime.    [provider]  Omega-3 Fatty Acids (FISH OIL ULTRA) 1400 MG CAPS Take 1,400 mg by mouth daily.    [provider]  polyethylene glycol (MIRALAX / GLYCOLAX) 17 g packet Take 8.5 g by mouth daily.    [provider]  potassium chloride SA (KLOR-CON M) 20 MEQ tablet Take 1 tablet (20 mEq total) by mouth See admin instructions. Take TWO HALVES of one 20 mEq tablet by mouth every morning to equal a total dose of 20 mEq Patient taking differently: Take 20-40 mEq by mouth See admin instructions. Take TWO HALVES of one 20 mEq tablet by mouth every morning to equal a total dose of 20 mEq Increase to 40 meq on the same days he takes 40 mg of Lasix 07/23/22   Lorretta Harp, MD  Probiotic Product (PROBIOTIC PO) Take 1 capsule by mouth in the morning.    [provider]  triamcinolone cream (KENALOG) 0.1 % Apply 1 Application topically 2 (two) times daily.    [provider]  TYLENOL 8 HOUR ARTHRITIS PAIN 650 MG CR tablet Take 1,300 mg by mouth in the morning and at bedtime.    [provider]  warfarin (COUMADIN) 2 MG tablet Take 1 tablet (2 mg total) by mouth at bedtime. Begin taking 9/30 (hold dose 9/29) Patient taking differently: Take 2 mg by mouth at bedtime. Take 4 MG on Thursday ONLY. 08/29/22   Nita Sells, MD     Critical care time: 45 minutes    Magdalen Spatz, MSN, AGACNP-BC [place] for personal pager PCCM on call pager [phone]  01/26/2023 10:24 AM

## 2023-01-26 NOTE — ED Notes (Signed)
Pt continues to yell for help and attempting to get out of the bed. Pt assessed, no signs of distress noted. Pt also unable to identify why he needs help.

## 2023-01-26 NOTE — ED Notes (Signed)
Dressing to LLE changed

## 2023-01-26 NOTE — Progress Notes (Deleted)
el

## 2023-01-26 NOTE — Progress Notes (Signed)
  Echocardiogram 2D Echocardiogram has been performed.  Robert Fisher 01/26/2023, 2:22 PM

## 2023-01-26 NOTE — Progress Notes (Addendum)
Discussed with Dr. Patsey Berthold CCM.  Patient now with persistent hypotension despite 3.5 L bolus overnight.  Most recent blood pressure 85/54 MAP 64.  CCM will assume care.  Day team will see patient.  Levophed has been started and lactic acid ordered

## 2023-01-26 NOTE — Progress Notes (Addendum)
Pharmacy Antibiotic Note  Robert Fisher is a 89 y.o. male admitted on 01/25/2023 with  hypotension and fever, possible sepsis .  Pharmacy has been consulted for vancomycin and Zosyn dosing. Scr 1.39 (BL ~1). WBC 9.5, Tmax 101.24F.   Plan: Vancomycin 1750 mg IV x 1, then 1,000 mg IV q24h (eAUC 434, Scr 1.39, Vd 0.72)  Zosyn 3.375 g IV q8h EI  Monitor C&S, renal function, s/sx improvement, vancomycin levels at [facility] as indicated   Height: '5\' 10"'$  (177.8 cm) Weight: 86.2 kg (190 lb) IBW/kg (Calculated) : 73  Temp (24hrs), Avg:99.7 F (37.6 C), Min:98.5 F (36.9 C), Max:101.4 F (38.6 C)  Recent Labs  Lab 01/25/23 1033 01/25/23 1034 01/25/23 2219 01/25/23 2324 01/26/23 0329 01/26/23 0629  WBC 9.9  --  2.4*  --  9.5  --   CREATININE 1.22  --   --  1.17 1.39*  --   LATICACIDVEN  --  1.5  --   --   --  3.6*    Estimated Creatinine Clearance: 39.4 mL/min (A) (by C-G formula based on SCr of 1.39 mg/dL (H)).    Allergies  Allergen Reactions   Haloperidol Lactate Other (See Comments)    "Made him climb the walls."   Carbamazepine Other (See Comments)    Caused seizures    Sildenafil Other (See Comments)    "pain in right thigh."    Antimicrobials this admission: Ceftriaxone 2/26 x 1 Vancomycin 2/26 >>  Zosyn 2/26 >>   Microbiology results: 2/25 Bcx - sent 2/24 Ucx - sent 2/26 MRSA pcr - sent  Thank you for allowing pharmacy to be a part of this patient's care.  Eliseo Gum, PharmD PGY1 Pharmacy Resident   01/26/2023  8:49 AM

## 2023-01-26 NOTE — Progress Notes (Signed)
[facility] Progress Note Patient Name: Robert Fisher DOB: [birth year] MRN: [id number]   Date of Service  01/26/2023  HPI/Events of Note  Episode of VFIB with ventricular rate = 140s - 150s. Patient currently on a Norepinephrine IV infusion for hemodynamic support. Home Metoprolol held d/t Norepinephrine IV infusion.   eICU Interventions  Plan: BMP and Mg++ level STAT.  Bolus with Amiodarone 150 mg IV over 10 minutes.      Intervention Category Major Interventions: Arrhythmia - evaluation and management  Nicey Krah Cornelia Copa 01/26/2023, 10:47 PM

## 2023-01-26 NOTE — ED Provider Notes (Signed)
Patient signed out pending UA.  Urinalysis is nitrite positive with greater than 50 white cells and greater than 50 red cells.  This is a catheterized specimen.  It was sent for culture.  Patient was given IV Rocephin.  This is likely the cause of the patient's acute altered mental status.  For this reason we will plan for admission.  I have repeated CK as well as it was slightly elevated upon admission.  Physical Exam  BP 101/79 (BP Location: Right Arm)   Pulse (!) 103   Temp 98.9 F (37.2 C) (Oral)   Resp (!) 25   Ht 1.778 m ('5\' 10"'$ )   Wt 86.2 kg   SpO2 95%   BMI 27.26 kg/m   ED Course / MDM    Medical Decision Making Amount and/or Complexity of Data Reviewed Labs: ordered. Radiology: ordered.  Risk Decision regarding hospitalization.   Problem List Items Addressed This Visit       Genitourinary   * (Principal) UTI (urinary tract infection)     Other   Elevated INR   Other Visit Diagnoses     Fall, initial encounter    -  Primary   Nonhealing nonsurgical wound with necrosis of muscle       Urethral trauma, initial encounter                 Merryl Hacker, MD 01/26/23 [phone]

## 2023-01-26 NOTE — ED Notes (Signed)
Pts HR noted to be higher than initial recording. EKG captured and provider made aware

## 2023-01-26 NOTE — ED Notes (Signed)
Provider made aware of pts temp

## 2023-01-26 NOTE — Progress Notes (Signed)
The patient was seen and noted to not have a sacral wound.  There is some blanching tissue noted over his sacrum, but no wound.  No surgical needs.  We will sign off.  Discussed with primary service.  Henreitta Cea 11:04 AM 01/26/2023

## 2023-01-26 NOTE — TOC Initial Note (Addendum)
Transition of Care [facility]) - Initial/Assessment Note    Patient Details  Name: Robert Fisher MRN: [id number] Date of Birth: [birth year]  Transition of Care [facility]) CM/SW Contact:    Cyndi Bender, RN Phone Number: 01/26/2023, 2:05 PM  Clinical Narrative:                 Damaris Schooner to daughter, [name], regarding home situation.  [name] states patient lives with her mother and daughter, son in law helps.  [name] states he has gotten too hard lately and patient needs to go to SNF at discharge.  Patient has used [facility] home health in the past.  TOC will continue to follow for needs.    Expected Discharge Plan: Skilled Nursing Facility Barriers to Discharge: Continued Medical Work up   Patient Goals and CMS Choice Patient states their goals for this hospitalization and ongoing recovery are:: daughter is agreeable to SNF          Expected Discharge Plan and Services       Living arrangements for the past 2 months: Single Family Home                                      Prior Living Arrangements/Services Living arrangements for the past 2 months: Single Family Home Lives with:: Spouse                   Activities of Daily Living      Permission Sought/Granted                  Emotional Assessment              Admission diagnosis:  Acute cystitis [N30.00] Elevated INR [R79.1] Fever [R50.9] Acute cystitis with hematuria [N30.01] Urethral trauma, initial encounter [S37.30XA] Fall, initial encounter [W19.XXXA] Nonhealing nonsurgical wound with necrosis of muscle [T14.8XXA, M62.89] Patient Active Problem List   Diagnosis Date Noted   UTI (urinary tract infection) 01/26/2023   Fall at home, initial encounter 01/26/2023   Hematuria 01/26/2023   Acute cystitis 01/26/2023   Fever 01/26/2023   Acute on chronic heart failure ([place]) 10/31/2022   Anxiety 10/31/2022   Bipolar 2 disorder ([place]) 10/31/2022   Callus of foot 10/31/2022    Chronic pain disorder 10/31/2022   Dyslipidemia 10/31/2022   Frequent falls 10/31/2022   Generalized anxiety disorder 10/31/2022   History of falling 10/31/2022   Hyperglycemia due to diabetes mellitus ([place]) 10/31/2022   Hypoglycemia 10/31/2022   Laceration of scalp without foreign body 10/31/2022   Edema due to congestive heart failure ([place]) 10/31/2022   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   Malignant (primary) neoplasm, unspecified ([place]) 10/31/2022   Pancreatic cyst 10/31/2022   Preventative health care 10/31/2022   Centrilobular emphysema ([place]) 10/31/2022   Secondary malignant neoplasm of left lung ([place]) 10/31/2022   Spondylosis without myelopathy or radiculopathy, lumbar region 10/31/2022   Migration of percutaneous cholecystostomy tube 08/27/2022   Weakness generalized 08/27/2022   Generalized weakness 08/26/2022   Seizure ([place]) 05/21/2022   CKD (chronic kidney disease) stage 2, GFR 60-89 ml/min 05/21/2022   DM (diabetes mellitus), type 2 ([place]) 05/21/2022   Constipation 05/21/2022   Bilateral leg edema    Elevated INR    LFT elevation    Acute cholecystitis 05/19/2022   Pain in left foot 04/04/2022   Essential hypertension 04/02/2022   Lower extremity edema 04/02/2022   Hallux malleus 04/01/2021  Malignant neoplasm of upper lobe of left lung ([place]) 06/25/2020   Spasm 05/01/2020   Hypokalemia 04/17/2020   Impacted cerumen 03/19/2020   Hardening of the aorta (main artery of the heart) ([place]) 12/19/2019   Malignant neoplasm of upper lobe, left bronchus or lung ([place]) 02/14/2019   Dermatochalasis of both lower eyelids 10/06/2017   Dermatochalasis of both upper eyelids 10/06/2017   Tear film insufficiency 08/10/2017   Abnormal gait 02/05/2017   Adult failure to thrive 02/05/2017   Muscle weakness 11/06/2016   Disequilibrium 01/02/2016   Tinea unguium 01/02/2016   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 08/02/2015   Diabetic renal disease ([place]) 08/02/2015    Non-thrombocytopenic purpura ([place]) 08/02/2015   Abnormal results of thyroid function studies 07/27/2014   Melanoma of back ([place]) 10/18/2013   Basal cell carcinoma of skin 07/21/2013   Enthesopathy 07/21/2013   Hearing loss 07/21/2013   Other specified personal risk factors, not elsewhere classified 07/21/2013   Chronic kidney disease, stage 3a ([place]) 06/30/2012   Low back pain 04/14/2012   Seborrheic keratosis 11/12/2011   Long term (current) use of anticoagulants 11/01/2009   ED (erectile dysfunction) of organic origin 10/24/2009   Major depression, single episode 10/24/2009   Osteoarthritis 10/24/2009   Schizophrenia, unspecified ([place]) 10/24/2009   Secondary polycythemia 10/24/2009   Tobacco user 10/24/2009   ADVEF, DRUG/MEDICINAL/BIOLOGICAL SUBST NOS 08/30/2007   HYPERLIPIDEMIA NEC/NOS 06/08/2007   Paranoid schizophrenia in remission ([place]) 03/19/2007   PERIPHERAL VASCULAR DISEASE 03/19/2007   SKIN CANCER, HX OF 03/19/2007   PULMONARY EMBOLISM, HX OF 03/19/2007   PCP:  Shon Baton, MD Pharmacy:   [facility] ORDER) [place], [place] [address] Phone: [phone] Fax: [phone]  [facility] [phone] [place], [place] - [place] [address] Phone: [phone] Fax: [place] [id number] [place], [place] [address] [place] [address] Phone: [phone] Fax: [phone]     Social Determinants of Health ([place]) Social History: SDOH Screenings   Food Insecurity: No Food Insecurity (08/28/2022)  Housing: Low Risk  (08/28/2022)  Transportation Needs: No Transportation Needs (08/28/2022)  Utilities: Not At Risk (08/28/2022)  Tobacco Use: Medium Risk (01/25/2023)   SDOH Interventions:     Readmission Risk Interventions     No data to display

## 2023-01-26 NOTE — Progress Notes (Signed)
Troponin 135 reported to Dr Lynetta Mare

## 2023-01-26 NOTE — ED Notes (Signed)
Pts buttocks had small 2.5cm wound in the middle. Blanchable, tender to touch, and open in the center. Dr.Crosley made aware.

## 2023-01-26 NOTE — Progress Notes (Signed)
Pharmacy Antibiotic Note  Robert Fisher is a 89 y.o. male admitted on 01/25/2023 with hypotension and fever, possible sepsis.  Pharmacy has been consulted for Zosyn dosing. Already on vanco. WBC 2.4. Renal function good.   Plan: Already on vancomycin Add Zosyn 3.375G IV q8h to be infused over 4 hours   Height: '5\' 10"'$  (177.8 cm) Weight: 86.2 kg (190 lb) IBW/kg (Calculated) : 73  Temp (24hrs), Avg:99.7 F (37.6 C), Min:98.5 F (36.9 C), Max:101.4 F (38.6 C)  Recent Labs  Lab 01/25/23 1033 01/25/23 1034 01/25/23 2219 01/25/23 2324 01/26/23 0329  WBC 9.9  --  2.4*  --  9.5  CREATININE 1.22  --   --  1.17 1.39*  LATICACIDVEN  --  1.5  --   --   --      Estimated Creatinine Clearance: 39.4 mL/min (A) (by C-G formula based on SCr of 1.39 mg/dL (H)).    Allergies  Allergen Reactions   Haloperidol Lactate Other (See Comments)    "Made him climb the walls."   Carbamazepine Other (See Comments)    Caused seizures    Sildenafil Other (See Comments)    "pain in right thigh."    Narda Bonds, PharmD, [place] Pharmacist Phone: [phone]

## 2023-01-26 NOTE — ED Notes (Signed)
Pt continues to remove EKG leads and O2 monitor. Pt reoriented and monitor reapplied. Pt yelling at this RN to leave him alone and remove monitors. Pt also noted to have small amount of bleed from his penis. Dr. Dina Rich made aware

## 2023-01-26 NOTE — H&P (Addendum)
PCP:   Shon Baton, MD   Chief Complaint:  Trauma  HPI: This is a 89 year old male with PMHx of dementia, HTN, HLD, DM2, paranoid schizophrenia, seizure disorder, former tobacco abuse, COPD, lung cancer, history of DVT/PE on warfarin and chronic ambulatory dysfunction.  Patient brought to ER initially as a trauma patient.  He has chair that will take patient upright to standing position.  Earlier this morning he activated the chair and it threw him forward.  He was cleared by Ortho.  He was noted to have urinary retention, several unsuccessful attempts were made at Foley placement including coud cath.  Patient developed bleeding from his penis along with tenderness.  He is on Coumadin.  Urology was called, Foley was placed.  The patient has dementia, however, the family thought he was a bit more confused compared to baseline.  UA sent from clean cath, is suggestive of infection.  He has been given Rocephin, admission requested.  Patient bedbound at baseline.  Review of Systems:  Unable to obtain secondary to dementia/encephalopathy  Past Medical History: Past Medical History:  Diagnosis Date   Borderline diabetes    takes Metformin   Cancer ([place])    lung 2020 per spouse LUL   COPD (chronic obstructive pulmonary disease) ([place])    Diabetes mellitus without complication ([place])    DVT (deep venous thrombosis) ([place])    Full dentures    Hearing aid worn    B/L   HOH (hard of hearing)    Hypertension    Left upper lobe pulmonary nodule    Memory problem    Pneumonia    Schizo affective schizophrenia ([place])    Seizure ([place]) 05/21/2022   Seizures ([place])    Past Surgical History:  Procedure Laterality Date   cancer removal     legs   CATARACT EXTRACTION     COLONOSCOPY     FUDUCIAL PLACEMENT N/A 03/09/2019   Procedure: PLACEMENT OF FUDUCIAL;  Surgeon: [name], MD;  Location: [facility];  Service: Thoracic;  Laterality: N/A;   IR CHOLANGIOGRAM EXISTING TUBE  09/23/2022   IR  EXCHANGE BILIARY DRAIN  07/08/2022   IR EXCHANGE BILIARY DRAIN  08/01/2022   IR EXCHANGE BILIARY DRAIN  08/28/2022   IR EXCHANGE BILIARY DRAIN  09/03/2022   IR PERC CHOLECYSTOSTOMY  05/23/2022   IR RADIOLOGIST EVAL & MGMT  07/11/2022   IR RADIOLOGIST EVAL & MGMT  09/10/2022   IR REMOVAL BILIARY DRAIN  09/23/2022   IR REMOVAL OF CALCULI/DEBRIS BILIARY DUCT/GB  08/01/2022   THROMBECTOMY     VIDEO BRONCHOSCOPY WITH ENDOBRONCHIAL NAVIGATION N/A 03/09/2019   Procedure: VIDEO BRONCHOSCOPY WITH ENDOBRONCHIAL NAVIGATION;  Surgeon: [name], MD;  Location: [facility];  Service: Thoracic;  Laterality: N/A;    Medications: Prior to Admission medications   Medication Sig Start Date End Date Taking? Authorizing Provider  aspirin EC 81 MG tablet Take 81 mg by mouth at bedtime.    [provider]  atorvastatin (LIPITOR) 40 MG tablet Take 1 tablet (40 mg total) by mouth at bedtime. 06/03/22   Eugenie Filler, MD  Brimonidine Tartrate (LUMIFY) 0.025 % SOLN Place 1 drop into both eyes daily as needed (dry eyes).    [provider]  buPROPion (WELLBUTRIN) 100 MG tablet Take 100 mg by mouth in the morning.    [provider]  Calcium Carb-Cholecalciferol (CALCIUM 500 + D PO) Take 1 tablet by mouth daily.    [provider]  clonazePAM (  KLONOPIN) 0.5 MG tablet Take 0.25 mg by mouth in the morning and at bedtime.    [provider]  Emollient (CERAVE MOISTURIZING EX) Apply 1 application  topically 2 (two) times daily. Apply to bilateral toes-to-calves daily    [provider]  fenofibrate 160 MG tablet Take 160 mg by mouth daily.    [provider]  furosemide (LASIX) 20 MG tablet Take 1 tablet (20 mg total) by mouth See admin instructions. Take 20 mg by mouth in the morning on Mon/Tues/Wed/Fri Patient taking differently: Take 20-40 mg by mouth See admin instructions. Take 20 mg by mouth in the morning on Mon/Tues/Wed/Fri May increase dose to 40 mg  twice weekly as needed for swelling 07/23/22   Lorretta Harp, MD  gabapentin (NEURONTIN) 100 MG capsule Take 100 mg by mouth at bedtime.    [provider]  levETIRAcetam (KEPPRA) 500 MG tablet Take 1/2 tablet twice a day 06/20/22   Cameron Sprang, MD  linagliptin (TRADJENTA) 5 MG TABS tablet Take 5 mg by mouth every other day.    [provider]  liver oil-zinc oxide (DESITIN) 40 % ointment Apply 1 Application topically as needed for irritation.    [provider]  metFORMIN (GLUCOPHAGE) 500 MG tablet Take 500 mg by mouth in the morning and at bedtime.    [provider]  metoprolol tartrate (LOPRESSOR) 25 MG tablet Take 25 mg by mouth daily.     [provider]  Multiple Vitamins-Minerals (MULTIVITAMIN GUMMIES MENS) CHEW Chew 1 tablet by mouth daily.    [provider]  Nystatin (GERHARDT'S BUTT CREAM) CREA Apply 1 Application topically 2 (two) times daily. 08/29/22   Nita Sells, MD  OLANZapine (ZYPREXA) 10 MG tablet Take 20 mg by mouth at bedtime.    [provider]  Omega-3 Fatty Acids (FISH OIL ULTRA) 1400 MG CAPS Take 1,400 mg by mouth daily.    [provider]  polyethylene glycol (MIRALAX / GLYCOLAX) 17 g packet Take 8.5 g by mouth daily.    [provider]  potassium chloride SA (KLOR-CON M) 20 MEQ tablet Take 1 tablet (20 mEq total) by mouth See admin instructions. Take TWO HALVES of one 20 mEq tablet by mouth every morning to equal a total dose of 20 mEq Patient taking differently: Take 20-40 mEq by mouth See admin instructions. Take TWO HALVES of one 20 mEq tablet by mouth every morning to equal a total dose of 20 mEq Increase to 40 meq on the same days he takes 40 mg of Lasix 07/23/22   Lorretta Harp, MD  Probiotic Product (PROBIOTIC PO) Take 1 capsule by mouth in the morning.    [provider]  triamcinolone cream (KENALOG) 0.1 % Apply 1 Application topically 2 (two) times daily.     [provider]  TYLENOL 8 HOUR ARTHRITIS PAIN 650 MG CR tablet Take 1,300 mg by mouth in the morning and at bedtime.    [provider]  warfarin (COUMADIN) 2 MG tablet Take 1 tablet (2 mg total) by mouth at bedtime. Begin taking 9/30 (hold dose 9/29) Patient taking differently: Take 2 mg by mouth at bedtime. Take 4 MG on Thursday ONLY. 08/29/22   Nita Sells, MD    Allergies:   Allergies  Allergen Reactions   Haloperidol Lactate Other (See Comments)    "Made him climb the walls."   Carbamazepine Other (See Comments)    Caused seizures    Sildenafil Other (  See Comments)    "pain in right thigh."    Social History:  reports that he quit smoking about 7 years ago. His smoking use included cigarettes and pipe. He started smoking about 70 years ago. He has a 50.00 pack-year smoking history. He has never used smokeless tobacco. He reports that he does not drink alcohol and does not use drugs.  Family History: History reviewed. No pertinent family history.  Physical Exam: Vitals:   01/26/23 0030 01/26/23 0040 01/26/23 0042 01/26/23 0045  BP: 90/69 101/79  101/79  Pulse:   (!) 103 (!) 103  Resp: (!) 26 (!) 29 20 (!) 25  Temp:      TempSrc:      SpO2:   95% 95%  Weight:      Height:        General: Demented male, well developed, no acute distress.  Mostly sleeping Eyes: PERRLA, pink conjunctiva, no scleral icterus ENT: Dry oral mucosa, neck supple, no thyromegaly Lungs: clear to ascultation, no wheeze, no crackles, no use of accessory muscles Cardiovascular: Tachycardia, regular rate and rhythm, no regurgitation, no gallops, no murmurs. No carotid bruits, no JVD Abdomen: soft, positive BS, non-tender, non-distended, no organomegaly, not an acute abdomen GU: Foley in place, blood oozing from penis.  Tender when manipulated.  No hematoma noted Neuro: Unable to accurately assess given patient's dementia and sleepiness.  No pathologic  indicators Musculoskeletal: Appears grossly intact, unable to accurately assess with patient dementia and sleepiness Skin: Left lateral mid calf necrotic pressure ulcer that tunnels into skin.  Surrounding skin is erythematous.  Sacral decub stage II Psych: Demented patient   Labs on Admission:  Recent Labs    01/25/23 1033 01/25/23 2324  NA 141 142  K 3.6 3.1*  CL 109 111  CO2 20* 20*  GLUCOSE 190* 119*  BUN 18 14  CREATININE 1.22 1.17  CALCIUM 9.5 8.7*   Recent Labs    01/25/23 1033  AST 30  ALT 17  ALKPHOS 41  BILITOT 0.8  PROT 7.0  ALBUMIN 3.3*     Recent Labs    01/25/23 1033  CKTOTAL 558*    Radiological Exams on Admission: CT CERVICAL SPINE WO CONTRAST  Result Date: 01/25/2023 CLINICAL DATA:  Trauma EXAM: CT HEAD WITHOUT CONTRAST CT CERVICAL SPINE WITHOUT CONTRAST TECHNIQUE: Multidetector CT imaging of the head and cervical spine was performed following the standard protocol without intravenous contrast. Multiplanar CT image reconstructions of the cervical spine were also generated. RADIATION DOSE REDUCTION: This exam was performed according to the departmental dose-optimization program which includes automated exposure control, adjustment of the mA and/or kV according to patient size and/or use of iterative reconstruction technique. COMPARISON:  CT C Spine and Head 04/03/22 FINDINGS: CT HEAD FINDINGS Brain: No evidence of acute infarction, hemorrhage, hydrocephalus, extra-axial collection or mass lesion/mass effect. Sequela of moderate to severe chronic microvascular ischemic change. Generalized volume loss. Vascular: No disproportionately hyperdense vessel or unexpected calcification. Skull: Normal. Negative for fracture or focal lesion. Radiodensities along the frontal scalp are favored to represent dermal calcifications. Mild soft tissue swelling along the left parietal scalp. Sinuses/Orbits: Bilateral lens replacement. No middle ear or mastoid effusion. There are  frothy secretions in the right sphenoid sinus. Other: None. CT CERVICAL SPINE FINDINGS Alignment: There is trace anterolisthesis of C3 on C4. Trace retrolisthesis of C6 onC7. Skull base and vertebrae: No acute fracture. No primary bone lesion or focal pathologic process. There are multilevel degenerative endplate changes with osseous fusion  of C5-C6 and severe disc space loss at C6-C7. Soft tissues and spinal canal: No prevertebral fluid or swelling. No visible canal hematoma. Disc levels:  No evidence of high-grade spinal canal stenosis. Upper chest: There fissural fluid on the left.  No pneumothorax. Other: None IMPRESSION: 1. No CT evidence of intracranial injury. 2. Mild soft tissue swelling along the left parietal scalp. No evidence of underlying calvarial fracture. 3. No acute cervical spine fracture. Electronically Signed   By: Marin Roberts M.D.   On: 01/25/2023 12:27   CT HEAD WO CONTRAST  Result Date: 01/25/2023 CLINICAL DATA:  Trauma EXAM: CT HEAD WITHOUT CONTRAST CT CERVICAL SPINE WITHOUT CONTRAST TECHNIQUE: Multidetector CT imaging of the head and cervical spine was performed following the standard protocol without intravenous contrast. Multiplanar CT image reconstructions of the cervical spine were also generated. RADIATION DOSE REDUCTION: This exam was performed according to the departmental dose-optimization program which includes automated exposure control, adjustment of the mA and/or kV according to patient size and/or use of iterative reconstruction technique. COMPARISON:  CT C Spine and Head 04/03/22 FINDINGS: CT HEAD FINDINGS Brain: No evidence of acute infarction, hemorrhage, hydrocephalus, extra-axial collection or mass lesion/mass effect. Sequela of moderate to severe chronic microvascular ischemic change. Generalized volume loss. Vascular: No disproportionately hyperdense vessel or unexpected calcification. Skull: Normal. Negative for fracture or focal lesion. Radiodensities along the  frontal scalp are favored to represent dermal calcifications. Mild soft tissue swelling along the left parietal scalp. Sinuses/Orbits: Bilateral lens replacement. No middle ear or mastoid effusion. There are frothy secretions in the right sphenoid sinus. Other: None. CT CERVICAL SPINE FINDINGS Alignment: There is trace anterolisthesis of C3 on C4. Trace retrolisthesis of C6 onC7. Skull base and vertebrae: No acute fracture. No primary bone lesion or focal pathologic process. There are multilevel degenerative endplate changes with osseous fusion of C5-C6 and severe disc space loss at C6-C7. Soft tissues and spinal canal: No prevertebral fluid or swelling. No visible canal hematoma. Disc levels:  No evidence of high-grade spinal canal stenosis. Upper chest: There fissural fluid on the left.  No pneumothorax. Other: None IMPRESSION: 1. No CT evidence of intracranial injury. 2. Mild soft tissue swelling along the left parietal scalp. No evidence of underlying calvarial fracture. 3. No acute cervical spine fracture. Electronically Signed   By: Marin Roberts M.D.   On: 01/25/2023 12:27   DG Tibia/Fibula Left Port  Result Date: 01/25/2023 CLINICAL DATA:  Trauma, sore on posterior mid LEFT tib-fib. EXAM: PORTABLE LEFT TIBIA AND FIBULA - 2 VIEW COMPARISON:  None Available. FINDINGS: No acute findings. No evidence of fracture line or acutely displaced fracture fragment. No acute-appearing cortical irregularity or osseous lesion. Degenerative osteoarthritic changes at the LEFT knee, at least moderate in degree with associated osteophyte formation. Soft tissue lucency overlying the distal LEFT tib-fib, presumably corresponding to the sore described in the clinical data. No evidence of soft tissue gas elsewhere in the LEFT calf. IMPRESSION: 1. Soft tissue lucency overlying the distal LEFT tib-fib, presumably corresponding to the sore described in the clinical data. No evidence of underlying osteomyelitis or fracture. 2. No  acute osseous abnormality. Electronically Signed   By: Franki Cabot M.D.   On: 01/25/2023 11:09   DG Pelvis Portable  Result Date: 01/25/2023 CLINICAL DATA:  89 year old male status post fall. EXAM: PORTABLE PELVIS 1-2 VIEWS COMPARISON:  CT Abdomen and Pelvis 08/26/2022. FINDINGS: Portable AP supine view at 1040 hours. Chronic right inguinal surgical clips. Chronic bilateral acetabulum spurring. Bone mineralization  is normal for age. Femoral heads remain normally located. Pelvis appears stable and intact. Grossly intact proximal femurs. Negative visible bowel gas. Chronic lumbosacral junction disc and endplate degeneration. IMPRESSION: No acute fracture or dislocation identified about the pelvis. Electronically Signed   By: Genevie Ann M.D.   On: 01/25/2023 11:08   DG Chest Port 1 View  Result Date: 01/25/2023 CLINICAL DATA:  89 year old male with history of trauma. EXAM: PORTABLE CHEST 1 VIEW COMPARISON:  Chest x-ray 08/26/2022.  Chest CT 11/19/2022. FINDINGS: Chronic mass-like area of architectural distortion in the left upper lobe, with fiducial markers in this region, similar to the prior radiograph corresponding to areas of postradiation fibrosis on prior chest CT. No acute consolidative airspace disease. Blunting of the left costophrenic sulcus, similar to prior studies, attributable to a prominent fat pad in the lower left hemithorax on prior chest CT. Right lung is clear. No right pleural effusion. No pneumothorax. No evidence of pulmonary edema. Heart size is normal. The patient is rotated to the left on today's exam, resulting in distortion of the mediastinal contours and reduced diagnostic sensitivity and specificity for mediastinal pathology. Atherosclerotic calcifications in the thoracic aorta. IMPRESSION: 1. Stable radiographic appearance of the chest, as above, without findings to suggest acute cardiopulmonary disease. 2. Aortic atherosclerosis. Electronically Signed   By: Vinnie Langton M.D.    On: 01/25/2023 11:05    Assessment/Plan Present on Admission:  UTI (urinary tract infection)/sepsis -Sepsis: During the night patient developed hypotension, SBP 80'2, HR 115, T 101.4 Bolus bolus and Tylenol given -Blood and urine cultures collected. -Rocephin 1 g given in ER.  Continue with Zosyn -IV fluid hydration -Patient has EF 65 -70% on echo done 5/23.  Careful IV fluid hydration   Hematuria -Secondary to manipulation.  Monitor.  Patient still with blood oozing from his penis  Urinary retention -Foley placed by urologist.  Almost 1L urine return   Supratherapeutic INR/chronic anticoagulation secondary to history of PE -Coumadin on hold.  Pharmacy to manage -Repeat CBC ordered stat, given patient's hypotension.  However likely secondary to sepsis   Left calf wound pressure ulcer/sacral decub/cellulitic -Wound care consult placed -CT left calf ordered -Surgeon on-call contacted via epic chat -Patient on vancomycin and Zosyn  Dementia, w/ overlying encephalopathy -UTI being treated, this should collect encephalopathy -On no meds for dementia   Bipolar 2 disorder (HCC)// Schizophrenia, unspecified (HCC) -Continue Wellbutrin -QHS Klonopin on hold -Zyprexa on hold given patient's sleepiness   Seizure disorder -250 mg IV twice daily.  Changed to p.o. when patient mentation improved   Dyslipidemia -Atorvastatin, fenofibrate on hold   Essential hypertension -Metoprolol on hold given hypotension   Malignant neoplasm of upper lobe, left bronchus or lung ([place])  Adult failure to thrive   Jas Betten 01/26/2023, 1:35 AM

## 2023-01-26 NOTE — Progress Notes (Signed)
Pharmacy Antibiotic Note  Robert Fisher is a 89 y.o. male admitted on 01/25/2023 with hypotension and fever, possible sepsis.  Pharmacy has been consulted for Vancomycin dosing.  Plan: Vancomycin 1500 mg IV now, then 1250 mg IV q48h  Height: '5\' 10"'$  (177.8 cm) Weight: 86.2 kg (190 lb) IBW/kg (Calculated) : 73  Temp (24hrs), Avg:99.6 F (37.6 C), Min:98.5 F (36.9 C), Max:101.4 F (38.6 C)  Recent Labs  Lab 01/25/23 1033 01/25/23 1034 01/25/23 2219 01/25/23 2324 01/26/23 0329  WBC 9.9  --  2.4*  --  9.5  CREATININE 1.22  --   --  1.17 1.39*  LATICACIDVEN  --  1.5  --   --   --     Estimated Creatinine Clearance: 39.4 mL/min (A) (by C-G formula based on SCr of 1.39 mg/dL (H)).    Allergies  Allergen Reactions   Haloperidol Lactate Other (See Comments)    "Made him climb the walls."   Carbamazepine Other (See Comments)    Caused seizures    Sildenafil Other (See Comments)    "pain in right thigh."     Caryl Pina 01/26/2023 6:06 AM

## 2023-01-27 DIAGNOSIS — I959 Hypotension, unspecified: Secondary | ICD-10-CM | POA: Diagnosis not present

## 2023-01-27 DIAGNOSIS — E860 Dehydration: Secondary | ICD-10-CM | POA: Diagnosis not present

## 2023-01-27 LAB — BASIC METABOLIC PANEL
Anion gap: 10 (ref 5–15)
Anion gap: 10 (ref 5–15)
BUN: 20 mg/dL (ref 8–23)
BUN: 16 mg/dL (ref 8–23)
CO2: 19 mmol/L — ABNORMAL LOW (ref 22–32)
CO2: 18 mmol/L — ABNORMAL LOW (ref 22–32)
Calcium: 8.2 mg/dL — ABNORMAL LOW (ref 8.9–10.3)
Calcium: 8.2 mg/dL — ABNORMAL LOW (ref 8.9–10.3)
Chloride: 113 mmol/L — ABNORMAL HIGH (ref 98–111)
Chloride: 113 mmol/L — ABNORMAL HIGH (ref 98–111)
Creatinine, Ser: 1.34 mg/dL — ABNORMAL HIGH (ref 0.61–1.24)
Creatinine, Ser: 1.22 mg/dL (ref 0.61–1.24)
GFR, Estimated: 52 mL/min — ABNORMAL LOW (ref >60)
GFR, Estimated: 58 mL/min — ABNORMAL LOW (ref >60)
Glucose, Bld: 128 mg/dL — ABNORMAL HIGH (ref 70–99)
Glucose, Bld: 94 mg/dL (ref 70–99)
Potassium: 3.3 mmol/L — ABNORMAL LOW (ref 3.5–5.1)
Potassium: 3.3 mmol/L — ABNORMAL LOW (ref 3.5–5.1)
Sodium: 142 mmol/L (ref 135–145)
Sodium: 141 mmol/L (ref 135–145)

## 2023-01-27 LAB — GLUCOSE, CAPILLARY
Glucose-Capillary: 114 mg/dL — ABNORMAL HIGH (ref 70–99)
Glucose-Capillary: 135 mg/dL — ABNORMAL HIGH (ref 70–99)
Glucose-Capillary: 110 mg/dL — ABNORMAL HIGH (ref 70–99)
Glucose-Capillary: 110 mg/dL — ABNORMAL HIGH (ref 70–99)
Glucose-Capillary: 94 mg/dL (ref 70–99)

## 2023-01-27 LAB — CBC WITH DIFFERENTIAL/PLATELET
Abs Immature Granulocytes: 0 10*3/uL (ref 0.00–0.07)
Basophils Absolute: 0.3 10*3/uL — ABNORMAL HIGH (ref 0.0–0.1)
Basophils Relative: 1 %
Eosinophils Absolute: 0 10*3/uL (ref 0.0–0.5)
Eosinophils Relative: 0 %
HCT: 35.6 % — ABNORMAL LOW (ref 39.0–52.0)
Hemoglobin: 11 g/dL — ABNORMAL LOW (ref 13.0–17.0)
Lymphocytes Relative: 3 %
Lymphs Abs: 0.9 10*3/uL (ref 0.7–4.0)
MCH: 28.1 pg (ref 26.0–34.0)
MCHC: 30.9 g/dL (ref 30.0–36.0)
MCV: 91 fL (ref 80.0–100.0)
Monocytes Absolute: 1.2 10*3/uL — ABNORMAL HIGH (ref 0.1–1.0)
Monocytes Relative: 4 %
Neutro Abs: 27.2 10*3/uL — ABNORMAL HIGH (ref 1.7–7.7)
Neutrophils Relative %: 92 %
Platelets: 233 10*3/uL (ref 150–400)
RBC: 3.91 MIL/uL — ABNORMAL LOW (ref 4.22–5.81)
RDW: 17.4 % — ABNORMAL HIGH (ref 11.5–15.5)
WBC: 29.6 10*3/uL — ABNORMAL HIGH (ref 4.0–10.5)
nRBC: 0 % (ref 0.0–0.2)
nRBC: 0 /100 WBC

## 2023-01-27 LAB — LEGIONELLA PNEUMOPHILA SEROGP 1 UR AG: L. pneumophila Serogp 1 Ur Ag: NEGATIVE

## 2023-01-27 LAB — URINE CULTURE: Culture: >=100000 — AB

## 2023-01-27 LAB — CK: Total CK: 260 U/L (ref 49–397)

## 2023-01-27 LAB — LACTIC ACID, PLASMA
Lactic Acid, Venous: 1.6 mmol/L (ref 0.5–1.9)
Lactic Acid, Venous: 1.7 mmol/L (ref 0.5–1.9)

## 2023-01-27 LAB — PROTIME-INR
INR: 2.3 — ABNORMAL HIGH (ref 0.8–1.2)
Prothrombin Time: 25.5 seconds — ABNORMAL HIGH (ref 11.4–15.2)

## 2023-01-27 LAB — MAGNESIUM
Magnesium: 2.1 mg/dL (ref 1.7–2.4)
Magnesium: 2.1 mg/dL (ref 1.7–2.4)

## 2023-01-27 MED ORDER — SODIUM CHLORIDE 0.9 % IV SOLN
2.0000 g | INTRAVENOUS | Status: DC
  Administered 2023-01-27 – 2023-01-29 (×3): 2 g via INTRAVENOUS
  Filled 2023-01-27 (×3): qty 20

## 2023-01-27 MED ORDER — POTASSIUM CHLORIDE 10 MEQ/100ML IV SOLN
10.0000 meq | INTRAVENOUS | Status: AC
  Administered 2023-01-27 – 2023-01-28 (×6): 10 meq via INTRAVENOUS
  Filled 2023-01-27 (×6): qty 100

## 2023-01-27 NOTE — Progress Notes (Signed)
Initial Nutrition Assessment  DOCUMENTATION CODES:  Non-severe (moderate) malnutrition in context of chronic illness  INTERVENTION:  If unable to have diet advanced, recommend placement of cortrak tube to provide enteral feeds as pt has malnutrition present.  Would recommend discussing long-term GOC with family around nutrition  NUTRITION DIAGNOSIS:  Moderate Malnutrition related to chronic illness as evidenced by mild fat depletion, mild muscle depletion.  GOAL:   Patient will meet greater than or equal to 90% of their needs  MONITOR:   Diet advancement, Skin, I & O's, Weight trends  REASON FOR ASSESSMENT:  Malnutrition Screening Tool    ASSESSMENT:  Pt with hx of HTN, HLD, DM type 2, COPD, dementia, schizophrenia, and hx lung cancer in 2020 presented to ED after a fall at home and with increased confusion. Pt found to have sepsis related to UTI.  Noted that pt is bed/chair bound at baseline.  Pt resting in bed at the time of assessment. Awake and interactive but pleasantly confused. Pt remains NPO at this time. Discussed in rounds, RN reports she has been unable to test swallowing as pt can't stay awake for long but hopeful she will be able to assess today. If unable to have diet advanced and in align with goals of care, pt would benefit from cortrak tube and initiation of enteral support as malnutrition is present based on physical exam.    Intake/Output Summary (Last 24 hours) at 01/27/2023 1622 Last data filed at 01/27/2023 1500 Gross per 24 hour  Intake 2900.36 ml  Output 660 ml  Net 2240.36 ml  Net IO Since Admission: 7,049.96 mL [01/27/23 1622]  Nutritionally Relevant Medications: Scheduled Meds:  insulin aspart  0-15 Units Subcutaneous Q4H   Continuous Infusions:  norepinephrine (LEVOPHED) Adult infusion 6 mcg/min (01/27/23 0700)   piperacillin-tazobactam (ZOSYN)  IV 12.5 mL/hr at 01/27/23 0700   vancomycin     PRN Meds: docusate sodium, ondansetron,  polyethylene glycol, senna-docusate  Labs Reviewed: K 3.3 Creatinine 1.34 CBG ranges from 114-154 mg/dL over the last 24 hours HgbA1c 6.8% INR 2.3  NUTRITION - FOCUSED PHYSICAL EXAM: Flowsheet Row Most Recent Value  Orbital Region Mild depletion  Upper Arm Region Moderate depletion  Thoracic and Lumbar Region Mild depletion  Buccal Region Mild depletion  Temple Region Moderate depletion  Clavicle Bone Region No depletion  Clavicle and Acromion Bone Region Mild depletion  Scapular Bone Region Mild depletion  Dorsal Hand Unable to assess  Patellar Region Severe depletion  Anterior Thigh Region Severe depletion  Posterior Calf Region Severe depletion  Edema (RD Assessment) None  Hair Reviewed  Eyes Reviewed  Mouth Reviewed  Skin Reviewed  Nails Reviewed   Diet Order:   Diet Order             Diet NPO time specified Except for: Ice Chips  Diet effective now                   EDUCATION NEEDS:  Not appropriate for education at this time  Skin:  Skin Assessment: Reviewed RN Assessment Unstageable - left lateral calf (1.3cm x 1.0cm x 0.2cm) Stage 2 - right upper sacrum (0.2 cm x 0.2 cm)  Last BM:  2/27 - type 6  Height:  Ht Readings from Last 1 Encounters:  01/25/23 '5\' 10"'$  (1.778 m)    Weight:  Wt Readings from Last 1 Encounters:  01/27/23 81.2 kg    Ideal Body Weight:  75.5 kg  BMI:  Body mass index is 25.69  kg/m.  Estimated Nutritional Needs:  Kcal:  1700-1900 kcal/d Protein:  85-100 g/d Fluid:  >/=1.8L/d    Ranell Patrick, RD, LDN Clinical Dietitian RD pager # available in [facility]  After hours/weekend pager # available in [facility]

## 2023-01-27 NOTE — Progress Notes (Signed)
[facility] ADULT ICU REPLACEMENT PROTOCOL   The patient does apply for the [facility] Adult ICU Electrolyte Replacment Protocol based on the criteria listed below:   1.Exclusion criteria: TCTS, ECMO, Dialysis, and Myasthenia Gravis patients 2. Is GFR >/= 30 ml/min? Yes.    Patient's GFR today is 58 3. Is SCr </= 2? Yes.   Patient's SCr is 1.22 mg/dL 4. Did SCr increase >/= 0.5 in 24 hours? No. 5.Pt's weight >40kg  Yes.   6. Abnormal electrolyte(s): k 3.3  7. Electrolytes replaced per protocol 8.  Call MD STAT for K+ </= 2.5, Phos </= 1, or Mag </= 1 Physician:    Billy Fischer 01/27/2023 9:36 PM

## 2023-01-27 NOTE — Progress Notes (Addendum)
NAME:  Robert Fisher, MRN:  [id number], DOB:  22-Nov-1934, LOS: 1 ADMISSION DATE:  01/25/2023, CONSULTATION DATE:  01/26/2023 REFERRING MD:  Claria Dice, CHIEF COMPLAINT:  Hypotension, acute AMS, fever with suspected urosepsis   History of Present Illness:  89 year old male with PMHx of dementia, HTN, HLD, DM2, paranoid schizophrenia, seizure disorder, former tobacco abuse, COPD, lung cancer, history of DVT/PE on warfarin and chronic ambulatory dysfunction.  Patient brought to ER 01/25/2023 initially as a trauma patient.  He has chair that will take patient upright to standing position.  Earlier on 2/25  he activated the chair and it threw him forward.  He was cleared by Ortho.  He was noted to have urinary retention, several unsuccessful attempts were made at Foley placement including coud cath.  Patient developed bleeding from his penis along with tenderness.  He is on Coumadin.  Urology was called, Foley was placed.  The patient has dementia, however, the family thought he was a bit more confused compared to baseline.  UA sent from clean cath, is suggestive of infection.  He has been given Rocephin, and admission was requested by the ED. Pt. Is currently on Vancomycin and Zosyn, and Blood / Urine Cx are pending. Patient bedbound at baseline. Urology had to be consulted to place Foley Cath.PCCM were asked to admit as patient is requiring peripheral pressors at 3 mcg/ min   Overnight 2/25-2/26  Pt has remained confused > than baseline and INR and lactate have bumped .He is now on peripheral vasopressors for hypotension that developed overnight. He has received 3.5 L of IVF, still looks dry He has been started on Vancomycin and Zosyn for suspected urosepsis. L calf would could also be a source .    Initial labs in the ED on 2/25 Albumin of 3.3, GFR of 58, Creatinine 1.22  WBC of 9.9 with HGB 13.8 and platelets of 306, INR of 4.6 WBC dropped by 2200 to 2.4/ HGB was 12.6, platelets 223 CK 558, Lactate  1.5, UA was hazy, with large blood and 5 Ketones, Nitrite positive, Moderate leukocytes/ Many bacteria K was 3.1    Labs 2/26 in ED CK had increased to 573, K of 3.2, CO2 of 18 ( from 20), Cl 112 Creatinine 1.39 from 1.22, GFR 49 WBC of 9.2, HGB 11.9, platelets of 227, INR 7 ( from 4.6) , Lactate of 3.6 from 1.5  Mag 1.7   T max 101.4 01/26/2023 CT Tibia/ Fibula Left w/o>>  Soft tissue ulceration posterolaterally in the distal lower leg. Nonspecific generalized subcutaneous edema in the mid to lower leg which may reflect superficial soft tissue infection (cellulitis). No evidence of focal fluid collection, foreign body or soft tissue emphysema. No evidence of osteomyelitis, or acute osseous findings , advanced degenerative changes of the left knee with multiple intra-articular loose bodies and a moderate-sized joint effusion. Diffuse vascular calcifications. 2/25 CT Left Tib Fib >> Soft tissue lucency overlying the distal LEFT tib-fib, presumably corresponding to the sore. No evidence of underlying osteomyelitis or fracture. No acute osseous abnormality 2/25 CT Cervical spine and head >> No evidence of intracranial injury, mild tissue swelling along L parietal scalp, No evidence of underlying calvarial fracture, no acute cervical spine fx. .  Pertinent  Medical History   Past Medical History:  Diagnosis Date   Borderline diabetes    takes Metformin   Cancer ([place])    lung 2020 per spouse LUL   COPD (chronic obstructive pulmonary disease) ([place])    Diabetes  mellitus without complication (HCC)    DVT (deep venous thrombosis) (HCC)    Full dentures    Hearing aid worn    B/L   HOH (hard of hearing)    Hypertension    Left upper lobe pulmonary nodule    Memory problem    Pneumonia    Schizo affective schizophrenia ([place])    Seizure ([place]) 05/21/2022   Seizures ([place])    Significant Hospital Events: Including procedures, antibiotic start and stop dates in addition to other pertinent  events   2/25 To [place] for fall, found to have suspected urosepsis 2/26 Admitted to hospital / PCCM consulted 2/26 Zosyn and Vanc started   Interim History / Subjective:  Patient is pleasant and interactive this morning. He has no acute complaints. He says he feels better today.  Objective   Blood pressure (!) 116/51, pulse 90, temperature 99.8 F (37.7 C), temperature source Axillary, resp. rate 20, height '5\' 10"'$  (1.778 m), weight 81.2 kg, SpO2 97 %.        Intake/Output Summary (Last 24 hours) at 01/27/2023 0741 Last data filed at 01/27/2023 0700 Gross per 24 hour  Intake 4712.12 ml  Output 1415 ml  Net 3297.12 ml   Filed Weights   01/25/23 1109 01/27/23 0500  Weight: 86.2 kg 81.2 kg    Examination: Constitutional:Elderly gentleman, sleeping, in no acute distress. HENT:Oral mucosa is very dry. Cardio:Sinus tachycardia with occasional PVCs. No murmurs, rubs, or gallops. Pulm:Clear to auscultation bilaterally. Normal work of breathing on room air. QF:[id number] for extremity edema. Skin:Warm and dry. Minimal erythema over bilateral shins. Wound pads over posterior R ankle, L shin. Neuro:Awake and alert, responds appropriately to questions. Slightly difficult to understand him but he does not have his dentures in. No focal deficit noted. Psych:Pleasantly confused.  Resolved Hospital Problem list   Shock--hypovolemic vs septic vs both  Assessment & Plan:  Urosepsis Acute encephalopathy Patient has tolerated being off of NE since 0700 with MAP >65. He did have low-grade fevers overnight, intermittent episodes of ventricular fibrillation with rates 140s-150s. Blood culture shows NGTD, urine culture with klebsiella pneumoniae (resistant to ampicillin, intermediate resistance to nitrofurantoin). Leukocytosis now 29.6 from 9.5. Procalcitonin significantly elevated 02/26 to 47.30. He has been receiving IVF resuscitation since admission. He is currently on zosyn and vancomycin due to  initial concerns for soft tissue infection. He remains quite drowsy but seems to be gradually waking up. Plan: -Titrate off NE, goal MAP >65 -Continue LR 100 cc/h -Narrow antibiotics to ceftriaxone -Stop zosyn, vancomycin -Trend lactic acid through normalization -Hold home MWF lasix 20 mg in setting of shock and pressor needs -F/u urine strep, legionella antigens -Hold home centrally acting medications -Bedside swallow once more alert to allow for PO resuscitation  Elevated INR from warfarin use INR improved to 2.3 today. He received Vitamin K yesterday due to significantly elevated INR to 7.0. Likely acutely, severely elevated in setting of acute sepsis and I anticipate normalization as his infection is treated. Plan: -Trend CBC -Monitor INR  Episode of ventricular fibrillation Overnight, patient had episode of ventricular fibrillation with rates 140s-150s. He received amiodarone 150 mg IV x1 dose. This morning his rhythm is sinus tachycardia with occasional PVCs. Echocardiogram 02/26 showed LF EF 60-65% with normal function, elevated LV end-diastolic pressure; RV normal function; moderately dilated LA; degenerative MV with trivial regurgitation; moderate calcification of AV. Troponin 135 02/26 but I suspect this is due to demand. I suspect his ectopy is in the setting of  his age and acute illness. Plan: -Trend daily magnesium, potassium levels and replete as indicated -Telemetry  Pressure injury to L lateral calf, unstageable CK has now normalized, 260. Plan: -Wound care consulted -Due to concern for neglect, TOC has been asked to call APS -Will attempt to contact patient's PCP today  Elevated cortisol level Cortisol 65.9 02/26. I suspect this is in the setting of acute stress. Plan: -Follow up as outpatient   AKI 2/2 sepsis Hypokalemia Renal function overall unchanged overnight. UOP over past 24h of 1215 mL, 0.7 mL/kg/h which is normal range. Creatinine increase nearly in  normal range compared to prior apparent baseline. K 3.3, Mg 2.1. Plan: -Continue LR 100 cc/h -Replete electrolytes as indicated -Trend BMP   Type 2 diabetes mellitus Plan: Currently holding home metformin and tradjenta. CBG have been WNL.  Plan: -SSI -Resume home gabapentin once more awake/alert   Seizure disorder Home regimen is keppra 250 mg BID.  Plan: -Keppra 250 mg IV BID    Hx bipolar disorder Hx schizophrenia  Hx dementia Plan: -Hold home wellbutrin, klonopin, zyprexa to allow for increased level of alertness  BPH Plan: -Has foley catheter in place  Best Practice (right click and "Reselect all SmartList Selections" daily)   Diet/type: NPO DVT prophylaxis: None GI prophylaxis: H2B Lines: N/A Foley:  Yes, and it is still needed Code Status:  DNR Last date of multidisciplinary goals of care discussion [02/26]  Labs   CBC: Recent Labs  Lab 01/25/23 1033 01/25/23 2219 01/26/23 0329 01/27/23 0132  WBC 9.9 2.4* 9.5 29.6*  NEUTROABS  --  1.6* 9.3* 27.2*  HGB 13.8 12.6* 11.9* 11.0*  HCT 46.2 40.2 39.2 35.6*  MCV 92.8 91.0 91.2 91.0  PLT 306 223 227 0000000    Basic Metabolic Panel: Recent Labs  Lab 01/25/23 1033 01/25/23 2324 01/26/23 0329 01/26/23 0629 01/26/23 2251 01/27/23 0132  NA 141 142 143  --  142 142  K 3.6 3.1* 3.2*  --  3.3* 3.3*  CL 109 111 112*  --  114* 113*  CO2 20* 20* 18*  --  19* 19*  GLUCOSE 190* 119* 153*  --  132* 128*  BUN '18 14 17  '$ --  21 20  CREATININE 1.22 1.17 1.39*  --  1.38* 1.34*  CALCIUM 9.5 8.7* 8.7*  --  8.2* 8.2*  MG  --   --   --  1.7 2.0 2.1   GFR: Estimated Creatinine Clearance: 40.9 mL/min (A) (by C-G formula based on SCr of 1.34 mg/dL (H)). Recent Labs  Lab 01/25/23 1033 01/25/23 1034 01/25/23 2219 01/26/23 0329 01/26/23 0629 01/26/23 1124 01/27/23 0132  PROCALCITON  --   --   --   --   --  47.30  --   WBC 9.9  --  2.4* 9.5  --   --  29.6*  LATICACIDVEN  --  1.5  --   --  3.6* 4.0*  --     Liver  Function Tests: Recent Labs  Lab 01/25/23 1033  AST 30  ALT 17  ALKPHOS 41  BILITOT 0.8  PROT 7.0  ALBUMIN 3.3*   No results for input(s): "LIPASE", "AMYLASE" in the last 168 hours. No results for input(s): "AMMONIA" in the last 168 hours.  ABG    Component Value Date/Time   PHART 7.420 11/30/2008 1320   PCO2ART 36.6 11/30/2008 1320   PO2ART 86.6 11/30/2008 1320   HCO3 23.3 11/30/2008 1320   TCO2 19.5 11/30/2008 1320  ACIDBASEDEF 0.2 11/30/2008 1320   O2SAT 96.6 11/30/2008 1335     Coagulation Profile: Recent Labs  Lab 01/25/23 1033 01/26/23 0629 01/27/23 0132  INR 4.6* 7.0* 2.3*    Cardiac Enzymes: Recent Labs  Lab 01/25/23 1033 01/26/23 0200 01/27/23 0132  CKTOTAL 558* 573* 260    HbA1C: Hgb A1c MFr Bld  Date/Time Value Ref Range Status  01/26/2023 11:24 AM 6.8 (H) 4.8 - 5.6 % Final    Comment:    (NOTE)         Prediabetes: 5.7 - 6.4         Diabetes: >6.4         Glycemic control for adults with diabetes: <7.0   05/20/2022 04:04 AM 6.8 (H) 4.8 - 5.6 % Final    Comment:    (NOTE) Pre diabetes:          5.7%-6.4%  Diabetes:              >6.4%  Glycemic control for   <7.0% adults with diabetes     CBG: Recent Labs  Lab 01/26/23 1113 01/26/23 1532 01/26/23 1945 01/26/23 2325 01/27/23 0335  GLUCAP 149* 163* 149* 154* 114*    Past Medical History:  He,  has a past medical history of Borderline diabetes, Cancer ([place]), COPD (chronic obstructive pulmonary disease) ([place]), Diabetes mellitus without complication ([place]), DVT (deep venous thrombosis) ([place]), Full dentures, Hearing aid worn, HOH (hard of hearing), Hypertension, Left upper lobe pulmonary nodule, Memory problem, Pneumonia, Schizo affective schizophrenia ([place]), Seizure ([place]) (05/21/2022), and Seizures ([place]).   Surgical History:   Past Surgical History:  Procedure Laterality Date   cancer removal     legs   CATARACT EXTRACTION     COLONOSCOPY     FUDUCIAL PLACEMENT N/A 03/09/2019    Procedure: PLACEMENT OF FUDUCIAL;  Surgeon: [name], MD;  Location: [facility];  Service: Thoracic;  Laterality: N/A;   IR CHOLANGIOGRAM EXISTING TUBE  09/23/2022   IR EXCHANGE BILIARY DRAIN  07/08/2022   IR EXCHANGE BILIARY DRAIN  08/01/2022   IR EXCHANGE BILIARY DRAIN  08/28/2022   IR EXCHANGE BILIARY DRAIN  09/03/2022   IR PERC CHOLECYSTOSTOMY  05/23/2022   IR RADIOLOGIST EVAL & MGMT  07/11/2022   IR RADIOLOGIST EVAL & MGMT  09/10/2022   IR REMOVAL BILIARY DRAIN  09/23/2022   IR REMOVAL OF CALCULI/DEBRIS BILIARY DUCT/GB  08/01/2022   THROMBECTOMY     VIDEO BRONCHOSCOPY WITH ENDOBRONCHIAL NAVIGATION N/A 03/09/2019   Procedure: VIDEO BRONCHOSCOPY WITH ENDOBRONCHIAL NAVIGATION;  Surgeon: [name], MD;  Location: [facility];  Service: Thoracic;  Laterality: N/A;     Social History:   reports that he quit smoking about 7 years ago. His smoking use included cigarettes and pipe. He started smoking about 70 years ago. He has a 50.00 pack-year smoking history. He has never used smokeless tobacco. He reports that he does not drink alcohol and does not use drugs.   Family History:  His family history is not on file.   Allergies Allergies  Allergen Reactions   Haloperidol Lactate Other (See Comments)    "Made him climb the walls."   Carbamazepine Other (See Comments)    Caused seizures    Sildenafil Other (See Comments)    "pain in right thigh."     Home Medications  Prior to Admission medications   Medication Sig Start Date End Date Taking? Authorizing Provider  aspirin EC 81 MG tablet Take 81 mg by mouth  at bedtime.   Yes [provider]  atorvastatin (LIPITOR) 40 MG tablet Take 1 tablet (40 mg total) by mouth at bedtime. 06/03/22  Yes Eugenie Filler, MD  Brimonidine Tartrate (LUMIFY) 0.025 % SOLN Place 1 drop into both eyes daily as needed (dry eyes).   Yes [provider]  buPROPion (WELLBUTRIN) 100 MG tablet Take 100 mg by mouth daily.   Yes [provider]  Calcium Carb-Cholecalciferol (CALCIUM 500 + D PO) Take 1 tablet by mouth daily.   Yes [provider]  clonazePAM (KLONOPIN) 0.5 MG tablet Take 0.25 mg by mouth in the morning and at bedtime.   Yes [provider]  Emollient (CERAVE MOISTURIZING EX) Apply 1 application  topically 2 (two) times daily. Apply to bilateral toes-to-calves daily   Yes [provider]  fenofibrate 160 MG tablet Take 160 mg by mouth daily.   Yes [provider]  furosemide (LASIX) 20 MG tablet Take 1 tablet (20 mg total) by mouth See admin instructions. Take 20 mg by mouth in the morning on Mon/Tues/Wed/Fri Patient taking differently: Take 20-40 mg by mouth See admin instructions. Take 20 mg by mouth in the morning on Mon/Tues/Wed/Fri May increase dose to 40 mg twice weekly as needed for swelling 07/23/22  Yes Lorretta Harp, MD  gabapentin (NEURONTIN) 100 MG capsule Take 100 mg by mouth at bedtime.   Yes [provider]  levETIRAcetam (KEPPRA) 500 MG tablet Take 1/2 tablet twice a day Patient taking differently: Take 250 mg by mouth 2 (two) times daily. 06/20/22  Yes Cameron Sprang, MD  linagliptin (TRADJENTA) 5 MG TABS tablet Take 5 mg by mouth every other day.   Yes [provider]  liver oil-zinc oxide (DESITIN) 40 % ointment Apply 1 Application topically as needed for irritation.   Yes [provider]  metFORMIN (GLUCOPHAGE) 500 MG tablet Take 500 mg by mouth in the morning and at bedtime.   Yes [provider]  metoprolol tartrate (LOPRESSOR) 25 MG tablet Take 25 mg by mouth daily.    Yes [provider]  Multiple Vitamins-Minerals (MULTIVITAMIN GUMMIES MENS) CHEW Chew 1 tablet by mouth daily.   Yes [provider]  Nystatin (GERHARDT'S BUTT CREAM) CREA Apply 1 Application topically 2 (two) times daily. 08/29/22  Yes Nita Sells, MD  OLANZapine (ZYPREXA) 10 MG tablet Take 20 mg by mouth at bedtime.   Yes  [provider]  Omega-3 Fatty Acids (FISH OIL ULTRA) 1400 MG CAPS Take 1,400 mg by mouth daily.   Yes [provider]  polyethylene glycol (MIRALAX / GLYCOLAX) 17 g packet Take 8.5 g by mouth daily as needed for mild constipation.   Yes [provider]  potassium chloride SA (KLOR-CON M) 20 MEQ tablet Take 1 tablet (20 mEq total) by mouth See admin instructions. Take TWO HALVES of one 20 mEq tablet by mouth every morning to equal a total dose of 20 mEq Patient taking differently: Take 20 mEq by mouth daily. 07/23/22  Yes Lorretta Harp, MD  Probiotic Product (PROBIOTIC PO) Take 1 capsule by mouth in the morning.   Yes [provider]  triamcinolone cream (KENALOG) 0.1 % Apply 1 Application topically daily as needed (For rash).   Yes [provider]  TYLENOL 8 HOUR ARTHRITIS PAIN 650 MG CR tablet Take 1,300 mg by mouth in the morning and at bedtime.   Yes [provider]  warfarin (COUMADIN) 2 MG tablet Take 1  tablet (2 mg total) by mouth at bedtime. Begin taking 9/30 (hold dose 9/29) Patient taking differently: Take 2-4 mg by mouth at bedtime. Take 2 mg by mouth every day except on Tuesdays take two of the 2 mg tablets for total of 4 mg per daughter 08/29/22  Yes Nita Sells, MD     Critical care time: 35 minutes    Farrel Gordon, DO Internal Medicine PGY-2 [phone]

## 2023-01-27 NOTE — Progress Notes (Signed)
[facility] ADULT ICU REPLACEMENT PROTOCOL   The patient does apply for the [facility] Adult ICU Electrolyte Replacment Protocol based on the criteria listed below:   1.Exclusion criteria: TCTS, ECMO, Dialysis, and Myasthenia Gravis patients 2. Is GFR >/= 30 ml/min? Yes.    Patient's GFR today is 50 3. Is SCr </= 2? Yes.   Patient's SCr is 1.38 mg/dL 4. Did SCr increase >/= 0.5 in 24 hours? No. 5.Pt's weight >40kg  Yes.   6. Abnormal electrolyte(s): k 3.3  7. Electrolytes replaced per protocol 8.  Call MD STAT for K+ </= 2.5, Phos </= 1, or Mag </= 1 Physician:    Ronda Fairly A 01/27/2023 12:03 AM

## 2023-01-28 DIAGNOSIS — W19XXXA Unspecified fall, initial encounter: Secondary | ICD-10-CM | POA: Diagnosis not present

## 2023-01-28 DIAGNOSIS — Z515 Encounter for palliative care: Secondary | ICD-10-CM

## 2023-01-28 DIAGNOSIS — M6289 Other specified disorders of muscle: Secondary | ICD-10-CM

## 2023-01-28 DIAGNOSIS — N3001 Acute cystitis with hematuria: Secondary | ICD-10-CM | POA: Diagnosis not present

## 2023-01-28 DIAGNOSIS — Z7189 Other specified counseling: Secondary | ICD-10-CM

## 2023-01-28 DIAGNOSIS — A419 Sepsis, unspecified organism: Secondary | ICD-10-CM | POA: Diagnosis not present

## 2023-01-28 DIAGNOSIS — G894 Chronic pain syndrome: Secondary | ICD-10-CM

## 2023-01-28 DIAGNOSIS — R6521 Severe sepsis with septic shock: Secondary | ICD-10-CM

## 2023-01-28 DIAGNOSIS — L8990 Pressure ulcer of unspecified site, unspecified stage: Secondary | ICD-10-CM | POA: Insufficient documentation

## 2023-01-28 DIAGNOSIS — T148XXA Other injury of unspecified body region, initial encounter: Secondary | ICD-10-CM

## 2023-01-28 DIAGNOSIS — G9341 Metabolic encephalopathy: Secondary | ICD-10-CM

## 2023-01-28 DIAGNOSIS — R627 Adult failure to thrive: Secondary | ICD-10-CM

## 2023-01-28 DIAGNOSIS — F411 Generalized anxiety disorder: Secondary | ICD-10-CM

## 2023-01-28 DIAGNOSIS — R791 Abnormal coagulation profile: Secondary | ICD-10-CM

## 2023-01-28 DIAGNOSIS — Z86711 Personal history of pulmonary embolism: Secondary | ICD-10-CM

## 2023-01-28 DIAGNOSIS — Z66 Do not resuscitate: Secondary | ICD-10-CM

## 2023-01-28 DIAGNOSIS — E440 Moderate protein-calorie malnutrition: Secondary | ICD-10-CM | POA: Insufficient documentation

## 2023-01-28 DIAGNOSIS — F209 Schizophrenia, unspecified: Secondary | ICD-10-CM

## 2023-01-28 DIAGNOSIS — N1831 Chronic kidney disease, stage 3a: Secondary | ICD-10-CM

## 2023-01-28 DIAGNOSIS — F3181 Bipolar II disorder: Secondary | ICD-10-CM

## 2023-01-28 LAB — BASIC METABOLIC PANEL
Anion gap: 10 (ref 5–15)
Anion gap: 11 (ref 5–15)
BUN: 15 mg/dL (ref 8–23)
BUN: 14 mg/dL (ref 8–23)
CO2: 18 mmol/L — ABNORMAL LOW (ref 22–32)
CO2: 17 mmol/L — ABNORMAL LOW (ref 22–32)
Calcium: 8.2 mg/dL — ABNORMAL LOW (ref 8.9–10.3)
Calcium: 8 mg/dL — ABNORMAL LOW (ref 8.9–10.3)
Chloride: 115 mmol/L — ABNORMAL HIGH (ref 98–111)
Chloride: 114 mmol/L — ABNORMAL HIGH (ref 98–111)
Creatinine, Ser: 1.2 mg/dL (ref 0.61–1.24)
Creatinine, Ser: 1.21 mg/dL (ref 0.61–1.24)
GFR, Estimated: 59 mL/min — ABNORMAL LOW (ref >60)
GFR, Estimated: 58 mL/min — ABNORMAL LOW (ref >60)
Glucose, Bld: 95 mg/dL (ref 70–99)
Glucose, Bld: 103 mg/dL — ABNORMAL HIGH (ref 70–99)
Potassium: 3.4 mmol/L — ABNORMAL LOW (ref 3.5–5.1)
Potassium: 3.6 mmol/L (ref 3.5–5.1)
Sodium: 143 mmol/L (ref 135–145)
Sodium: 142 mmol/L (ref 135–145)

## 2023-01-28 LAB — GLUCOSE, CAPILLARY
Glucose-Capillary: 107 mg/dL — ABNORMAL HIGH (ref 70–99)
Glucose-Capillary: 123 mg/dL — ABNORMAL HIGH (ref 70–99)
Glucose-Capillary: 88 mg/dL (ref 70–99)
Glucose-Capillary: 95 mg/dL (ref 70–99)
Glucose-Capillary: 96 mg/dL (ref 70–99)
Glucose-Capillary: 97 mg/dL (ref 70–99)
Glucose-Capillary: 99 mg/dL (ref 70–99)

## 2023-01-28 LAB — CBC
HCT: 35.5 % — ABNORMAL LOW (ref 39.0–52.0)
Hemoglobin: 11.3 g/dL — ABNORMAL LOW (ref 13.0–17.0)
MCH: 28.7 pg (ref 26.0–34.0)
MCHC: 31.8 g/dL (ref 30.0–36.0)
MCV: 90.1 fL (ref 80.0–100.0)
Platelets: 203 10*3/uL (ref 150–400)
RBC: 3.94 MIL/uL — ABNORMAL LOW (ref 4.22–5.81)
RDW: 17.2 % — ABNORMAL HIGH (ref 11.5–15.5)
WBC: 17 10*3/uL — ABNORMAL HIGH (ref 4.0–10.5)
nRBC: 0 % (ref 0.0–0.2)

## 2023-01-28 LAB — PROTIME-INR
INR: 1.4 — ABNORMAL HIGH (ref 0.8–1.2)
Prothrombin Time: 17 seconds — ABNORMAL HIGH (ref 11.4–15.2)

## 2023-01-28 MED ORDER — CLONAZEPAM 0.125 MG PO TBDP
0.2500 mg | ORAL_TABLET | Freq: Every day | ORAL | Status: DC
  Administered 2023-01-28 – 2023-02-02 (×6): 0.25 mg via ORAL
  Filled 2023-01-28 (×2): qty 2
  Filled 2023-01-28: qty 1
  Filled 2023-01-28 (×3): qty 2

## 2023-01-28 MED ORDER — WARFARIN SODIUM 2 MG PO TABS
2.0000 mg | ORAL_TABLET | Freq: Once | ORAL | Status: AC
  Administered 2023-01-28: 2 mg via ORAL
  Filled 2023-01-28: qty 1

## 2023-01-28 MED ORDER — CLONAZEPAM 0.1 MG/ML ORAL SUSPENSION: 0.2500 mg | Freq: Every day | ORAL | Status: DC

## 2023-01-28 MED ORDER — POTASSIUM CHLORIDE 10 MEQ/100ML IV SOLN
10.0000 meq | INTRAVENOUS | Status: AC
  Administered 2023-01-28 (×4): 10 meq via INTRAVENOUS
  Filled 2023-01-28 (×3): qty 100

## 2023-01-28 MED ORDER — ENSURE ENLIVE PO LIQD
237.0000 mL | Freq: Three times a day (TID) | ORAL | Status: DC
  Administered 2023-01-28 – 2023-01-29 (×4): 237 mL via ORAL

## 2023-01-28 MED ORDER — WARFARIN - PHARMACIST DOSING INPATIENT: Freq: Every day | Status: DC

## 2023-01-28 NOTE — Assessment & Plan Note (Addendum)
-   patient very debilitated and likely warrants palliative discussions - see Failure to thrive

## 2023-01-28 NOTE — Assessment & Plan Note (Addendum)
-   patient appears to have poor QOL at baseline and there's also concern for adequate care at home  - palliative care consulted for further [place] discussions  - patient did pass SLP eval on 2/28 and trial of FLD being started  -He continues to not show signs of improvement. Nutrition status will also become a large factor as time continues.  He does have a MOST form and also designates that he would not want a feeding tube. -Palliative care has spoken more with patient's daughter on 01/30/2023 and transition to comfort care has been decided upon - family unable to provide care for patient at home even with hospice in place; appears he's equivocal for BP to admit so best interest might be for patient to remain in hospital until he qualifies for residential vs passes in hospital (his intake seems not amenable to sustain life)

## 2023-01-28 NOTE — Assessment & Plan Note (Signed)
-   Patient had been very somnolent and sedated on admission.  Allowing further resolution of mentation -On hold currently: Wellbutrin, Zyprexa

## 2023-01-28 NOTE — Assessment & Plan Note (Addendum)
-   normal BP now -Continue comfort care

## 2023-01-28 NOTE — Assessment & Plan Note (Addendum)
-   suspect traumatic foley attempts - continue foley chronically at this point in setting of severe deconditioning and comfort care

## 2023-01-28 NOTE — Assessment & Plan Note (Addendum)
Continue clonazepam 

## 2023-01-28 NOTE — Progress Notes (Signed)
Brief Nutrition Note  SLP evaluated pt and able to place on a full liquid diet. Added ensure and magic cup to augment intake. Will follow-up as planned  Ranell Patrick, RD, LDN Clinical Dietitian RD pager # available in [place]  After hours/weekend pager # available in [facility]

## 2023-01-28 NOTE — Assessment & Plan Note (Addendum)
-   Database reviewed.  No chronic opioids noted -Now in setting of comfort care, okay for morphine for any pain or discomfort

## 2023-01-28 NOTE — Evaluation (Signed)
Clinical/Bedside Swallow Evaluation Patient Details  Name: Robert Fisher MRN: [id number] Date of Birth: [birth year]  Today's Date: 01/28/2023 Time: SLP Start Time (ACUTE ONLY): 1000 SLP Stop Time (ACUTE ONLY): 1025 SLP Time Calculation (min) (ACUTE ONLY): 25 min  Past Medical History:  Past Medical History:  Diagnosis Date   Borderline diabetes    takes Metformin   Cancer ([place])    lung 2020 per spouse LUL   COPD (chronic obstructive pulmonary disease) ([place])    Diabetes mellitus without complication ([place])    DVT (deep venous thrombosis) ([place])    Full dentures    Hearing aid worn    B/L   HOH (hard of hearing)    Hypertension    Left upper lobe pulmonary nodule    Memory problem    Pneumonia    Schizo affective schizophrenia ([place])    Seizure ([place]) 05/21/2022   Seizures ([place])    Past Surgical History:  Past Surgical History:  Procedure Laterality Date   cancer removal     legs   CATARACT EXTRACTION     COLONOSCOPY     FUDUCIAL PLACEMENT N/A 03/09/2019   Procedure: PLACEMENT OF FUDUCIAL;  Surgeon: [name], MD;  Location: [facility];  Service: Thoracic;  Laterality: N/A;   IR CHOLANGIOGRAM EXISTING TUBE  09/23/2022   IR EXCHANGE BILIARY DRAIN  07/08/2022   IR EXCHANGE BILIARY DRAIN  08/01/2022   IR EXCHANGE BILIARY DRAIN  08/28/2022   IR EXCHANGE BILIARY DRAIN  09/03/2022   IR PERC CHOLECYSTOSTOMY  05/23/2022   IR RADIOLOGIST EVAL & MGMT  07/11/2022   IR RADIOLOGIST EVAL & MGMT  09/10/2022   IR REMOVAL BILIARY DRAIN  09/23/2022   IR REMOVAL OF CALCULI/DEBRIS BILIARY DUCT/GB  08/01/2022   THROMBECTOMY     VIDEO BRONCHOSCOPY WITH ENDOBRONCHIAL NAVIGATION N/A 03/09/2019   Procedure: VIDEO BRONCHOSCOPY WITH ENDOBRONCHIAL NAVIGATION;  Surgeon: [name], MD;  Location: [facility];  Service: Thoracic;  Laterality: N/A;   HPI:  Patient is an 89 y.o. male with PMH: HTN, HLD, DM-2, COPD, dementia, schizophrenia and h/o lung cancer in 2020. He presented to the hospital  on 01/25/23 after accidental activation of his lift chair which threw him forward. In ED, ortho cleared him, he had urinary retention requiring cath and ultimately requiring Urology consult to place Foley. UA was suggestive of infection and he was admitted with urosepsis.    Assessment / Plan / Recommendation  Clinical Impression  Patient presents with clinical s/s of dysphagia as per this bedside swallow evaluation. He has no h/o documented of dysphagia and suspect that cognition and current deconditioned status is primary cause. Patient was awake and alert and able to follow basic commands for oral care but he is not oriented. SpO2 remained below 15 and SpO2 remained above 95% throughout evaluation. SLP cleared out a moderate amount of dried, sticky secretions from palate and tongue. After oral care, patient agreeable to teaspoon sips of water (thin consistency) followed by straw sips. He exhibited suspected delayed swallow initiation and intermittently had a delayed throat clear/cough which was unproductive. SLP then observed  him with PO's of applesauce and ice cream. Very mild amount of PO residuals remained in oral cavity but patient was able to clear majority of PO's from oral cavity after initial swallow. No change in voice or vitals. SLP recommending initiate PO diet of full liquids (thin consistency) with full supervision, total assist feeding. SLP will follow for toleration and ability to advance. SLP  Visit Diagnosis: Dysphagia, unspecified (R13.10)    Aspiration Risk  Risk for inadequate nutrition/hydration;Mild aspiration risk    Diet Recommendation Thin liquid;Other (Comment) (full liquids)   Medication Administration: Crushed with puree Supervision: Full supervision/cueing for compensatory strategies;Staff to assist with self feeding Compensations: Slow rate;Small sips/bites Postural Changes: Seated upright at 90 degrees    Other  Recommendations Oral Care Recommendations: Oral care  QID;Staff/trained caregiver to provide oral care;Oral care before and after PO Caregiver Recommendations: Have oral suction available    Recommendations for follow up therapy are one component of a multi-disciplinary discharge planning process, led by the attending physician.  Recommendations may be updated based on patient status, additional functional criteria and insurance authorization.  Follow up Recommendations Follow physician's recommendations for discharge plan and follow up therapies      Assistance Recommended at Discharge    Functional Status Assessment Patient has had a recent decline in their functional status and demonstrates the ability to make significant improvements in function in a reasonable and predictable amount of time.  Frequency and Duration min 2x/week  1 week       Prognosis Prognosis for improved oropharyngeal function: Fair Barriers to Reach Goals: Severity of deficits      Swallow Study   General Date of Onset: 01/25/23 HPI: Patient is an 89 y.o. male with PMH: HTN, HLD, DM-2, COPD, dementia, schizophrenia and h/o lung cancer in 2020. He presented to the hospital on 01/25/23 after accidental activation of his lift chair which threw him forward. In ED, ortho cleared him, he had urinary retention requiring cath and ultimately requiring Urology consult to place Foley. UA was suggestive of infection and he was admitted with urosepsis. Type of Study: Bedside Swallow Evaluation Previous Swallow Assessment: none found Diet Prior to this Study: NPO Temperature Spikes Noted: No Respiratory Status: Room air History of Recent Intubation: No Behavior/Cognition: Alert;Cooperative;Pleasant mood;Confused Oral Cavity Assessment: Dried secretions;Excessive secretions Oral Care Completed by SLP: Yes Oral Cavity - Dentition: Edentulous;Other (Comment) (dentures present in room but not placed) Self-Feeding Abilities: Total assist Patient Positioning: Upright in  bed Baseline Vocal Quality: Low vocal intensity Volitional Cough: Congested;Weak Volitional Swallow: Able to elicit    Oral/Motor/Sensory Function Overall Oral Motor/Sensory Function: Generalized oral weakness   Ice Chips     Thin Liquid Thin Liquid: Impaired Presentation: Straw;Spoon Pharyngeal  Phase Impairments: Suspected delayed Swallow;Throat Clearing - Delayed    Nectar Thick     Honey Thick     Puree Puree: Impaired Oral Phase Functional Implications: Oral residue;Prolonged oral transit Pharyngeal Phase Impairments: Throat Clearing - Delayed   Solid     Solid: Not tested      Sonia Baller, MA, CCC-SLP Speech Therapy

## 2023-01-28 NOTE — Progress Notes (Signed)
ANTICOAGULATION CONSULT NOTE - Initial Consult  Pharmacy Consult for warfarin Indication: h/o DVT with lung cancer   Allergies  Allergen Reactions   Haloperidol Lactate Other (See Comments)    "Made him climb the walls."   Carbamazepine Other (See Comments)    Caused seizures    Sildenafil Other (See Comments)    "pain in right thigh."    Patient Measurements: Height: '5\' 10"'$  (177.8 cm) Weight: 82.8 kg (182 lb 8.7 oz) IBW/kg (Calculated) : 73  Vital Signs: Temp: 99.4 F (37.4 C) (02/28 1105) Temp Source: Oral (02/28 1105) BP: 156/54 (02/28 0600) Pulse Rate: 94 (02/28 0600)  Labs: Recent Labs    01/26/23 0200 01/26/23 0329 01/26/23 0629 01/26/23 1124 01/26/23 2251 01/27/23 0132 01/27/23 2052 01/28/23 0049 01/28/23 0842  HGB  --  11.9*  --   --   --  11.0*  --   --  11.3*  HCT  --  39.2  --   --   --  35.6*  --   --  35.5*  PLT  --  227  --   --   --  233  --   --  203  LABPROT  --   --  59.7*  --   --  25.5*  --  17.0*  --   INR  --   --  7.0*  --   --  2.3*  --  1.4*  --   CREATININE  --  1.39*  --   --    < > 1.34* 1.22 1.20 1.21  CKTOTAL 573*  --   --   --   --  260  --   --   --   TROPONINIHS  --   --   --  135*  --   --   --   --   --    < > = values in this interval not displayed.    Estimated Creatinine Clearance: 45.2 mL/min (by C-G formula based on SCr of 1.21 mg/dL).   Medical History: Past Medical History:  Diagnosis Date   Borderline diabetes    takes Metformin   Cancer ([place])    lung 2020 per spouse LUL   COPD (chronic obstructive pulmonary disease) ([place])    Diabetes mellitus without complication ([place])    DVT (deep venous thrombosis) ([place])    Full dentures    Hearing aid worn    B/L   HOH (hard of hearing)    Hypertension    Left upper lobe pulmonary nodule    Memory problem    Pneumonia    Schizo affective schizophrenia ([place])    Seizure ([place]) 05/21/2022   Seizures ([place])     Assessment: Patient with history of remote DVT and  history of lung cancer. INR acutely elevated this admission in setting of sepsis- INR 7.0 2/26. Vitamin K 2.5 mg was administered 2/26. INR 1.4 2/28. Patient had some reported bleeding around his foley site earlier in admission, but RN reports no bleeding at this time. CBC is stable with Hgb in 11s, PLT 200s.   Note, patient will likely have a delayed response to warfarin administration in setting of recent Vitamin K administration. Patient is on a full liquid diet, however with limited PO intake.   PTA warfarin regimen: '2mg'$  daily, except 4 mg on Tuesdays (as reported by daughter)   Goal of Therapy:  INR 2-3 Monitor platelets by anticoagulation protocol: Yes   Plan:  Warfarin 2 mg x1  2/28 1600 (per PTA regimen)  CBC, INR daily    Adria Dill, PharmD PGY-2 Infectious Diseases Resident  01/28/2023 11:38 AM

## 2023-01-28 NOTE — Assessment & Plan Note (Signed)
-   No further mortality benefit to continuing statin - Discontinue statin

## 2023-01-28 NOTE — Assessment & Plan Note (Signed)
-   patient has history of CKD3a. Baseline creat ~ 1.2, eGFR~ 50-55

## 2023-01-28 NOTE — Progress Notes (Signed)
Progress Note    Robert Fisher   [id number]  DOB: 1934/05/14  DOA: 01/25/2023     2 PCP: Shon Baton, MD  Initial CC: fall  Hospital Course: Robert Fisher is an 89 yo male with PMH dementia, paranoid schizophrenia, seizure disorder, HTN, HLD, DMII, COPD, lung cancer, former tobacco use, history of DVT/PE on chronic Coumadin, chronic ambulatory dysfunction who initially presented to the hospital on 01/25/2023 after falling out of a chair.  He was initially evaluated by orthopedic surgery and cleared. Further workup revealed septic shock due to UTI requiring vasopressor use. He also had significant urinary retention requiring urology assistance for Foley catheter placement. Due to his overall generalized deconditioning and what appears to be progressive functional decline, palliative care was also consulted.  Interval History:  No events overnight.  Patient resting in bed when seen.  He has been graduated to full liquid diet after seen by speech this morning.  He does appear significantly deconditioned, frail, and to have a poor quality of life. Physical therapy has evaluated also and palliative care was consulted today. He was able to follow my commands and could talk some although his speech is very dysarthric.  Assessment and Plan: * Septic shock (HCC)-resolved as of 01/28/2023 - Treated in the ICU after admission due to refractory hypotension - levophed has been weaned off - Urine culture grew Klebsiella, patient on 7-day course of Rocephin  Acute cystitis - See septic shock - Continue Rocephin to complete 7-day course for Klebsiella noted in urine culture  History of pulmonary embolism - has been on coumadin for hx PE; need to still investigate timing of events and if plan truly was indefinite anticoagulation - INR has normalized and no overt bleeding - okay to resume Coumadin for now  Hematuria - suspect traumatic foley attempts - continue foley for now, possibly  chronically but more than likely will need several days of bladder rest as well as potential reconditioning which seems less likely  Adult failure to thrive - patient appears to have poor QOL at baseline and there's also concern for adequate care at home  - palliative care consulted for further [place] discussions  - patient did pass SLP eval on 2/28 and trial of FLD being started   Supratherapeutic INR - INR 4.6 on admission, peaked at 7. Given Vit K - INR 1.4 on 2/28  Malnutrition of moderate degree - Patient's BMI is Body mass index is 26.19 kg/m.. - Patient has the following signs/symptoms consistent with PCM: (fat loss, muscle loss). - seen by RD, appreciate assistance. Continue plan per RD - FLD started 2/28; not sure how much TF would add to his overall QOL or potential for improvement  - palliative care consulted as well   Fall at home, initial encounter - patient very debilitated and likely warrants palliative discussions - appreciate PT eval. Patient better suited for LTC at this time  Schizophrenia, unspecified ([place]) - Wellbutrin and Zyprexa on hold to allow improvement in mentation further  Generalized anxiety disorder - Continue clonazepam, will need to be slowly weaned off  Dyslipidemia - No further mortality benefit to continuing statin - Discontinue statin  Chronic pain disorder - Database reviewed.  No chronic opioids noted - Continue holding off on scheduled opioids  Chronic kidney disease, stage 3a ([place]) - patient has history of CKD3a. Baseline creat ~ 1.2, eGFR~ 50-55   Bipolar 2 disorder ([place]) - Patient had been very somnolent and sedated on admission.  Allowing further resolution  of mentation -On hold currently: Wellbutrin, Zyprexa  Anxiety - clonazepam resumed  Seizure disorder ([place]) - resume keppra - resume clonazepam at daily dosing; needs to be slowly weaned  Essential hypertension - normal BP now - will resume home meds as necessary; keep  holding for now  Acute metabolic encephalopathy-resolved as of 01/28/2023 - patient symptoms include AMS - etiology considered due to metabolic/septic for etiology     Old records reviewed in assessment of this patient  Antimicrobials: Rocephin 01/26/2023 >> current  DVT prophylaxis:  SCDs Start: 01/26/23 0845 warfarin (COUMADIN) tablet 2 mg   Code Status:   Code Status: DNR  Mobility Assessment (last 72 hours)     Mobility Assessment     Row Name 01/28/23 1000           What is the highest level of mobility based on the progressive mobility assessment? Level 1 (Bedfast) - Unable to balance while sitting on edge of bed                Barriers to discharge:  Disposition Plan: Pending palliative discussions Status is: Inpatient  Objective: Blood pressure (!) 156/54, pulse 94, temperature 99.4 F (37.4 C), temperature source Oral, resp. rate 19, height '5\' 10"'$  (1.778 m), weight 82.8 kg, SpO2 99 %.  Examination:  Physical Exam Constitutional:      Comments: Chronically ill-appearing elderly gentleman lying in bed in no distress  HENT:     Head: Normocephalic and atraumatic.     Mouth/Throat:     Mouth: Mucous membranes are moist.  Eyes:     Extraocular Movements: Extraocular movements intact.  Cardiovascular:     Rate and Rhythm: Normal rate and regular rhythm.  Pulmonary:     Effort: Pulmonary effort is normal. No respiratory distress.     Breath sounds: Normal breath sounds. No wheezing.  Abdominal:     General: Bowel sounds are normal. There is no distension.     Palpations: Abdomen is soft.     Tenderness: There is no abdominal tenderness.  Musculoskeletal:        General: No swelling.     Cervical back: Normal range of motion and neck supple.  Skin:    General: Skin is warm and dry.  Neurological:     Comments: Follows commands and moves all 4 extremities.  Dysarthric speech appreciated.  Globally weak      Consultants:  Palliative  care  Procedures:    Data Reviewed: Results for orders placed or performed during the hospital encounter of 01/25/23 (from the past 24 hour(s))  Glucose, capillary     Status: Abnormal   Collection Time: 01/27/23  3:21 PM  Result Value Ref Range   Glucose-Capillary 110 (H) 70 - 99 mg/dL  Glucose, capillary     Status: None   Collection Time: 01/27/23  7:57 PM  Result Value Ref Range   Glucose-Capillary 94 70 - 99 mg/dL  Basic metabolic panel     Status: Abnormal   Collection Time: 01/27/23  8:52 PM  Result Value Ref Range   Sodium 141 135 - 145 mmol/L   Potassium 3.3 (L) 3.5 - 5.1 mmol/L   Chloride 113 (H) 98 - 111 mmol/L   CO2 18 (L) 22 - 32 mmol/L   Glucose, Bld 94 70 - 99 mg/dL   BUN 16 8 - 23 mg/dL   Creatinine, Ser 1.22 0.61 - 1.24 mg/dL   Calcium 8.2 (L) 8.9 - 10.3 mg/dL   GFR, Estimated  58 (L) >60 mL/min   Anion gap 10 5 - 15  Magnesium     Status: None   Collection Time: 01/27/23  8:52 PM  Result Value Ref Range   Magnesium 2.1 1.7 - 2.4 mg/dL  Glucose, capillary     Status: None   Collection Time: 01/28/23 12:01 AM  Result Value Ref Range   Glucose-Capillary 97 70 - 99 mg/dL  Protime-INR     Status: Abnormal   Collection Time: 01/28/23 12:49 AM  Result Value Ref Range   Prothrombin Time 17.0 (H) 11.4 - 15.2 seconds   INR 1.4 (H) 0.8 - 1.2  Basic metabolic panel     Status: Abnormal   Collection Time: 01/28/23 12:49 AM  Result Value Ref Range   Sodium 143 135 - 145 mmol/L   Potassium 3.4 (L) 3.5 - 5.1 mmol/L   Chloride 115 (H) 98 - 111 mmol/L   CO2 18 (L) 22 - 32 mmol/L   Glucose, Bld 95 70 - 99 mg/dL   BUN 15 8 - 23 mg/dL   Creatinine, Ser 1.20 0.61 - 1.24 mg/dL   Calcium 8.2 (L) 8.9 - 10.3 mg/dL   GFR, Estimated 59 (L) >60 mL/min   Anion gap 10 5 - 15  Glucose, capillary     Status: None   Collection Time: 01/28/23  4:01 AM  Result Value Ref Range   Glucose-Capillary 88 70 - 99 mg/dL  Glucose, capillary     Status: Abnormal   Collection Time:  01/28/23  8:09 AM  Result Value Ref Range   Glucose-Capillary 107 (H) 70 - 99 mg/dL  Basic metabolic panel     Status: Abnormal   Collection Time: 01/28/23  8:42 AM  Result Value Ref Range   Sodium 142 135 - 145 mmol/L   Potassium 3.6 3.5 - 5.1 mmol/L   Chloride 114 (H) 98 - 111 mmol/L   CO2 17 (L) 22 - 32 mmol/L   Glucose, Bld 103 (H) 70 - 99 mg/dL   BUN 14 8 - 23 mg/dL   Creatinine, Ser 1.21 0.61 - 1.24 mg/dL   Calcium 8.0 (L) 8.9 - 10.3 mg/dL   GFR, Estimated 58 (L) >60 mL/min   Anion gap 11 5 - 15  CBC     Status: Abnormal   Collection Time: 01/28/23  8:42 AM  Result Value Ref Range   WBC 17.0 (H) 4.0 - 10.5 K/uL   RBC 3.94 (L) 4.22 - 5.81 MIL/uL   Hemoglobin 11.3 (L) 13.0 - 17.0 g/dL   HCT 35.5 (L) 39.0 - 52.0 %   MCV 90.1 80.0 - 100.0 fL   MCH 28.7 26.0 - 34.0 pg   MCHC 31.8 30.0 - 36.0 g/dL   RDW 17.2 (H) 11.5 - 15.5 %   Platelets 203 150 - 400 K/uL   nRBC 0.0 0.0 - 0.2 %  Glucose, capillary     Status: Abnormal   Collection Time: 01/28/23 11:02 AM  Result Value Ref Range   Glucose-Capillary 123 (H) 70 - 99 mg/dL    I have reviewed pertinent nursing notes, vitals, labs, and images as necessary. I have ordered labwork to follow up on as indicated.  I have reviewed the last notes from staff over past 24 hours. I have discussed patient's care plan and test results with nursing staff, CM/SW, and other staff as appropriate.  Time spent: Greater than 50% of the 55 minute visit was spent in counseling/coordination of care for the patient as  laid out in the A&P.   LOS: 2 days   Dwyane Dee, MD [facility] 01/28/2023, 1:25 PM

## 2023-01-28 NOTE — Evaluation (Signed)
Physical Therapy Evaluation Patient Details Name: Robert Fisher MRN: [id number] DOB: July 30, 1934 Today's Date: 01/28/2023  History of Present Illness  Pt is 89 year old presented to [facility] on  01/25/23 after fall out of lift chair at home. Pt found to have UTI/sepsis. PMH - HTN, HLD, DM type 2, COPD, dementia, schizophrenia, and hx lung cancer in 2020  Clinical Impression  Pt presents to PT dependent in all mobility and do not feel he has rehab potential. Recommend palliative care consult. Will need custodial care. Will sign off for PT.        Recommendations for follow up therapy are one component of a multi-disciplinary discharge planning process, led by the attending physician.  Recommendations may be updated based on patient status, additional functional criteria and insurance authorization.  Follow Up Recommendations Long-term institutional care without follow-up therapy Can patient physically be transported by private vehicle: No    Assistance Recommended at Discharge Frequent or constant Supervision/Assistance  Patient can return home with the following  Two people to help with bathing/dressing/bathroom;Two people to help with walking and/or transfers;Assistance with feeding    Equipment Recommendations Hospital bed;Other (comment) (hoyer lift)  Recommendations for Other Services       Functional Status Assessment Patient has not had a recent decline in their functional status     Precautions / Restrictions Precautions Precautions: Fall      Mobility  Bed Mobility Overal bed mobility: Needs Assistance Bed Mobility: Supine to Sit, Sit to Supine     Supine to sit: Total assist, HOB elevated Sit to supine: Total assist   General bed mobility comments: Assist for all aspects    Transfers                   General transfer comment: Needs mechanical lift    Ambulation/Gait                  Stairs            Wheelchair Mobility     Modified Rankin (Stroke Patients Only)       Balance Overall balance assessment: Needs assistance Sitting-balance support: Bilateral upper extremity supported, Feet unsupported Sitting balance-Leahy Scale: Zero Sitting balance - Comments: Total assist to sit EOB Postural control: Left lateral lean                                   Pertinent Vitals/Pain Pain Assessment Pain Assessment: PAINAD Breathing: normal Negative Vocalization: none Facial Expression: smiling or inexpressive Body Language: relaxed Consolability: no need to console PAINAD Score: 0    Home Living                          Prior Function Prior Level of Function : Patient poor historian/Family not available             Mobility Comments: Prior admission in June 2023 pt was essentially w/c bound.       Hand Dominance        Extremity/Trunk Assessment   Upper Extremity Assessment Upper Extremity Assessment: Defer to OT evaluation    Lower Extremity Assessment Lower Extremity Assessment: RLE deficits/detail;LLE deficits/detail RLE Deficits / Details: Active movement <2/5 LLE Deficits / Details: Active movement <2/5       Communication   Communication: HOH  Cognition Arousal/Alertness: Awake/alert Behavior During Therapy: WFL for tasks assessed/performed Overall Cognitive  Status: No family/caregiver present to determine baseline cognitive functioning Area of Impairment: Orientation, Memory, Following commands, Problem solving                 Orientation Level: Disoriented to, Place, Time, Situation   Memory: Decreased short-term memory Following Commands: Follows one step commands inconsistently, Follows one step commands with increased time     Problem Solving: Slow processing, Decreased initiation, Difficulty sequencing, Requires verbal cues, Requires tactile cues          General Comments      Exercises     Assessment/Plan    PT  Assessment Patient does not need any further PT services  PT Problem List         PT Treatment Interventions      PT Goals (Current goals can be found in the Care Plan section)  Acute Rehab PT Goals PT Goal Formulation: All assessment and education complete, DC therapy    Frequency       Co-evaluation               AM-PAC PT "6 Clicks" Mobility  Outcome Measure Help needed turning from your back to your side while in a flat bed without using bedrails?: Total Help needed moving from lying on your back to sitting on the side of a flat bed without using bedrails?: Total Help needed moving to and from a bed to a chair (including a wheelchair)?: Total Help needed standing up from a chair using your arms (e.g., wheelchair or bedside chair)?: Total Help needed to walk in hospital room?: Total Help needed climbing 3-5 steps with a railing? : Total 6 Click Score: 6    End of Session     Patient left: in bed;with call bell/phone within reach;with bed alarm set Nurse Communication: Mobility status;Need for lift equipment PT Visit Diagnosis: Other abnormalities of gait and mobility (R26.89)    Time: [id number] PT Time Calculation (min) (ACUTE ONLY): 19 min   Charges:   PT Evaluation $PT Eval Moderate Complexity: [place] 01/28/2023, 10:54 AM

## 2023-01-28 NOTE — Assessment & Plan Note (Signed)
-   INR 4.6 on admission, peaked at 7. Given Vit K - INR 1.4 on 2/28

## 2023-01-28 NOTE — Assessment & Plan Note (Addendum)
-   resume keppra - resume clonazepam at daily dosing; okay to continue in setting of comfort care with no weaning necessary

## 2023-01-28 NOTE — Assessment & Plan Note (Addendum)
-   Patient's BMI is Body mass index is 26.19 kg/m.. - Patient has the following signs/symptoms consistent with PCM: (fat loss, muscle loss). - seen by RD, appreciate assistance. Continue plan per RD - FLD started 2/28; I do not believe TF would add to his overall QOL or potential for improvement; poor prognosis; MOST form confirms patient to have no TF as well

## 2023-01-28 NOTE — Hospital Course (Signed)
Robert Fisher is an 89 yo male with PMH dementia, paranoid schizophrenia, seizure disorder, HTN, HLD, DMII, COPD, lung cancer, former tobacco use, history of DVT/PE on chronic Coumadin, chronic ambulatory dysfunction who initially presented to the hospital on 01/25/2023 after falling out of a chair.  He was initially evaluated by orthopedic surgery and cleared. Further workup revealed septic shock due to UTI requiring vasopressor use. He also had significant urinary retention requiring urology assistance for Foley catheter placement. Due to his overall generalized deconditioning and what appears to be progressive functional decline, palliative care was also consulted.

## 2023-01-28 NOTE — Assessment & Plan Note (Addendum)
-   Treated in the ICU after admission due to refractory hypotension - levophed has been weaned off - Urine culture grew Klebsiella, patient treated with Rocephin

## 2023-01-28 NOTE — Assessment & Plan Note (Signed)
-   patient symptoms include AMS - etiology considered due to metabolic/septic for etiology

## 2023-01-28 NOTE — Assessment & Plan Note (Signed)
-   clonazepam resumed

## 2023-01-28 NOTE — TOC Progression Note (Signed)
Transition of Care [facility]) - Progression Note    Patient Details  Name: Robert Fisher MRN: [id number] Date of Birth: 1933-12-30  [facility] of [facility]) CM/SW Contact  [name], LCSW Phone Number: 01/28/2023, 2:49 PM  Clinical Narrative:   CSW met with daughter Robert Fisher, goes by [name], and with pt wife Robert Fisher after PT evaluation recommends custodial care.  Pt has been at home with wife.  [name] and her husband providing live in assistance in caregiving up until 15 days ago, when [place] moved back out.  [name] reporting that caregiving burden has become to much.  [name] quite stressed at this point. Pt does not have LTC payer.  [name] reports he was evaluated for medicaid at some point and was over the income limit.  Pt was also referred to home hospice at one point but then Hospice was unable to provide services.  Discussed several areas to pursue: CSW will request another medicaid screening.  CSW will confirm if palliative consult has been ordered.  [name] will contact HTA about custodial benefits.  Referral made to [place]/financial counseling for medicaid screening. Palliative consult has been ordered by MD.    Expected Discharge Plan: [place] Barriers to Discharge: Continued Medical Work up  Expected Discharge Plan and [place] arrangements for the past 2 months: Single Family Home                                       Social Determinants of Health (SDOH) Interventions SDOH Screenings   Food Insecurity: No Food Insecurity (08/28/2022)  Housing: Low Risk  (08/28/2022)  Transportation Needs: No Transportation Needs (08/28/2022)  Utilities: Not At Risk (08/28/2022)  Tobacco Use: Medium Risk (01/25/2023)    Readmission Risk Interventions     No data to display

## 2023-01-28 NOTE — Assessment & Plan Note (Signed)
-   Wellbutrin and Zyprexa on hold to allow improvement in mentation further

## 2023-01-28 NOTE — Assessment & Plan Note (Addendum)
-   has been on coumadin for hx PE; need to still investigate timing of events and if plan truly was indefinite anticoagulation - INR has normalized and no overt bleeding -Patient now transitioning to comfort care.  Okay to discontinue Coumadin

## 2023-01-28 NOTE — Assessment & Plan Note (Addendum)
-   See septic shock - s/p Rocephin course for Klebsiella noted in urine culture

## 2023-01-28 NOTE — Consult Note (Signed)
Palliative Care Consult Note                                  Date: 01/28/2023   Patient Name: Robert Fisher  DOB: 1934/03/29  MRN: [id number]  Age / Sex: 89 y.o., male  PCP: Shon Baton, MD Referring Physician: Dwyane Dee, MD  Reason for Consultation: {Reason for Consult:23484}  HPI/Patient Profile: 89 y.o. male  with past medical history of *** admitted on 01/25/2023 with ***.   Past Medical History:  Diagnosis Date   Borderline diabetes    takes Metformin   Cancer ([place])    lung 2020 per spouse LUL   COPD (chronic obstructive pulmonary disease) ([place])    Diabetes mellitus without complication ([place])    DVT (deep venous thrombosis) ([place])    Full dentures    Hearing aid worn    B/L   HOH (hard of hearing)    Hypertension    Left upper lobe pulmonary nodule    Memory problem    Pneumonia    Schizo affective schizophrenia ([place])    Seizure ([place]) 05/21/2022   Seizures (HCC)     Subjective:   This NP Walden Field reviewed medical records, received report from team, assessed the patient and then meet at the patient's bedside to discuss diagnosis, prognosis, GOC, EOL wishes disposition and options.  I met with ***.   Concept of Palliative Care was introduced as specialized medical care for people and their families living with serious illness.  If focuses on providing relief from the symptoms and stress of a serious illness.  The goal is to improve quality of life for both the patient and the family. Values and goals of care important to patient and family were attempted to be elicited.  Created space and opportunity for patient  and family to explore thoughts and feelings regarding current medical situation   Natural trajectory and current clinical status were discussed. Questions and concerns addressed. Patient  encouraged to call with questions or concerns.    Patient/Family Understanding of Illness: ***  Life  Review: ***  Patient Values: ***  Goals: ***  Today's Discussion: ***  Review of Systems  Objective:   Primary Diagnoses: Present on Admission:  Hematuria  Adult failure to thrive  Anxiety  Bipolar 2 disorder (HCC)  Chronic kidney disease, stage 3a (HCC)  Chronic pain disorder  Dyslipidemia  Supratherapeutic INR  Essential hypertension  Generalized anxiety disorder  Malignant neoplasm of upper lobe, left bronchus or lung (HCC)  Schizophrenia, unspecified (HCC)  Acute cystitis   Physical Exam  Vital Signs:  BP (!) 156/54   Pulse 94   Temp 99.4 F (37.4 C) (Oral)   Resp 19   Ht '5\' 10"'$  (1.778 m)   Wt 82.8 kg   SpO2 99%   BMI 26.19 kg/m   Palliative Assessment/Data: ***    Advanced Care Planning:   Existing Vynca/ACP Documentation: ***  Primary Decision Maker: {Primary Decision [id number]  Code Status/Advance Care Planning: {Palliative Code status:23503}  A discussion was had today regarding advanced directives. Concepts specific to code status, artifical feeding and hydration, continued IV antibiotics and rehospitalization was had.  The difference between a aggressive medical intervention path and a palliative comfort care path for this patient at this time was had. ***The MOST form was introduced and discussed.***  Decisions/Changes to ACP: ***  Assessment & Plan:   Impression: ***  SUMMARY OF RECOMMENDATIONS   ***  Symptom Management:  ***  Prognosis:  {Palliative Care Prognosis:23504}  Discharge Planning:  {Palliative dispostion:23505}   Discussed with: ***    Thank you for allowing [place] to participate in the care of STAFFORD CHRISTEL PMT will continue to support holistically.  Time Total: ***  Greater than 50%  of this time was spent counseling and coordinating care related to the above assessment and plan.  Signed by: Walden Field, NP Palliative Medicine Team  Team Phone # [phone] (Nights/Weekends)  01/28/2023,  2:33 PM

## 2023-01-29 DIAGNOSIS — W19XXXA Unspecified fall, initial encounter: Secondary | ICD-10-CM | POA: Diagnosis not present

## 2023-01-29 DIAGNOSIS — R627 Adult failure to thrive: Secondary | ICD-10-CM | POA: Diagnosis not present

## 2023-01-29 DIAGNOSIS — Z515 Encounter for palliative care: Secondary | ICD-10-CM | POA: Diagnosis not present

## 2023-01-29 DIAGNOSIS — R6521 Severe sepsis with septic shock: Secondary | ICD-10-CM | POA: Diagnosis not present

## 2023-01-29 DIAGNOSIS — F419 Anxiety disorder, unspecified: Secondary | ICD-10-CM

## 2023-01-29 DIAGNOSIS — A419 Sepsis, unspecified organism: Secondary | ICD-10-CM | POA: Diagnosis not present

## 2023-01-29 DIAGNOSIS — Z7189 Other specified counseling: Secondary | ICD-10-CM | POA: Diagnosis not present

## 2023-01-29 DIAGNOSIS — Z66 Do not resuscitate: Secondary | ICD-10-CM | POA: Diagnosis not present

## 2023-01-29 DIAGNOSIS — Z86711 Personal history of pulmonary embolism: Secondary | ICD-10-CM | POA: Diagnosis not present

## 2023-01-29 LAB — MAGNESIUM: Magnesium: 2.2 mg/dL (ref 1.7–2.4)

## 2023-01-29 LAB — CBC WITH DIFFERENTIAL/PLATELET
Abs Immature Granulocytes: 0.06 10*3/uL (ref 0.00–0.07)
Basophils Absolute: 0 10*3/uL (ref 0.0–0.1)
Basophils Relative: 0 %
Eosinophils Absolute: 0.2 10*3/uL (ref 0.0–0.5)
Eosinophils Relative: 1 %
HCT: 39.4 % (ref 39.0–52.0)
Hemoglobin: 12.5 g/dL — ABNORMAL LOW (ref 13.0–17.0)
Immature Granulocytes: 0 %
Lymphocytes Relative: 12 %
Lymphs Abs: 1.6 10*3/uL (ref 0.7–4.0)
MCH: 28.3 pg (ref 26.0–34.0)
MCHC: 31.7 g/dL (ref 30.0–36.0)
MCV: 89.3 fL (ref 80.0–100.0)
Monocytes Absolute: 0.6 10*3/uL (ref 0.1–1.0)
Monocytes Relative: 5 %
Neutro Abs: 11.2 10*3/uL — ABNORMAL HIGH (ref 1.7–7.7)
Neutrophils Relative %: 82 %
Platelets: 230 10*3/uL (ref 150–400)
RBC: 4.41 MIL/uL (ref 4.22–5.81)
RDW: 16.9 % — ABNORMAL HIGH (ref 11.5–15.5)
WBC: 13.8 10*3/uL — ABNORMAL HIGH (ref 4.0–10.5)
nRBC: 0 % (ref 0.0–0.2)

## 2023-01-29 LAB — GLUCOSE, CAPILLARY
Glucose-Capillary: 100 mg/dL — ABNORMAL HIGH (ref 70–99)
Glucose-Capillary: 105 mg/dL — ABNORMAL HIGH (ref 70–99)
Glucose-Capillary: 110 mg/dL — ABNORMAL HIGH (ref 70–99)
Glucose-Capillary: 125 mg/dL — ABNORMAL HIGH (ref 70–99)
Glucose-Capillary: 125 mg/dL — ABNORMAL HIGH (ref 70–99)
Glucose-Capillary: 96 mg/dL (ref 70–99)

## 2023-01-29 LAB — BASIC METABOLIC PANEL
Anion gap: 9 (ref 5–15)
BUN: 11 mg/dL (ref 8–23)
CO2: 19 mmol/L — ABNORMAL LOW (ref 22–32)
Calcium: 8.2 mg/dL — ABNORMAL LOW (ref 8.9–10.3)
Chloride: 114 mmol/L — ABNORMAL HIGH (ref 98–111)
Creatinine, Ser: 0.95 mg/dL (ref 0.61–1.24)
GFR, Estimated: >60 mL/min (ref >60)
Glucose, Bld: 133 mg/dL — ABNORMAL HIGH (ref 70–99)
Potassium: 3.7 mmol/L (ref 3.5–5.1)
Sodium: 142 mmol/L (ref 135–145)

## 2023-01-29 LAB — PROTIME-INR
INR: 1.3 — ABNORMAL HIGH (ref 0.8–1.2)
Prothrombin Time: 15.7 seconds — ABNORMAL HIGH (ref 11.4–15.2)

## 2023-01-29 MED ORDER — WARFARIN SODIUM 2 MG PO TABS
2.0000 mg | ORAL_TABLET | Freq: Once | ORAL | Status: AC
  Administered 2023-01-29: 2 mg via ORAL
  Filled 2023-01-29: qty 1

## 2023-01-29 NOTE — Progress Notes (Signed)
Progress Note    [name]   [id number]  DOB: 1934/08/11  DOA: 01/25/2023     3 PCP: Shon Baton, MD  Initial CC: fall  Hospital Course: [name] is an 89 yo male with PMH dementia, paranoid schizophrenia, seizure disorder, HTN, HLD, DMII, COPD, lung cancer, former tobacco use, history of DVT/PE on chronic Coumadin, chronic ambulatory dysfunction who initially presented to the hospital on 01/25/2023 after falling out of a chair.  He was initially evaluated by orthopedic surgery and cleared. Further workup revealed septic shock due to UTI requiring vasopressor use. He also had significant urinary retention requiring urology assistance for Foley catheter placement. Due to his overall generalized deconditioning and what appears to be progressive functional decline, palliative care was also consulted.  Interval History:  No events overnight.  He appears worse to me today.  He has less energy and his speech is so dysarthric I cannot understand anything this morning.  He also underwent further evaluation with SLP after my exam and was having some signs of aspirating with associated coughing. I also called and spoke with daughter, giving her an update this morning.  Assessment and Plan: * Septic shock (HCC)-resolved as of 01/28/2023 - Treated in the ICU after admission due to refractory hypotension - levophed has been weaned off - Urine culture grew Klebsiella, patient on 7-day course of Rocephin  Acute cystitis - See septic shock - Continue Rocephin to complete 7-day course for Klebsiella noted in urine culture  History of pulmonary embolism - has been on coumadin for hx PE; need to still investigate timing of events and if plan truly was indefinite anticoagulation - INR has normalized and no overt bleeding - okay to resume Coumadin for now; no need for bridging given poor prognosis in general and ongoing [place] discussions.   Hematuria - suspect traumatic foley attempts -  continue foley for now, possibly chronically but more than likely will need several days of bladder rest as well as potential reconditioning which seems less likely  Adult failure to thrive - patient appears to have poor QOL at baseline and there's also concern for adequate care at home  - palliative care consulted for further [place] discussions  - patient did pass SLP eval on 2/28 and trial of FLD being started  - does not appear to show signs of significant improvement; he seems overall worse today (more lethargic, some aspiration with b'fast). He still seems most appropriate for hospice as the course he's on is not one to show improvement esp with poor physical reserve; I also called and discussed with his daughter today - follow up further palliative care discussions   Supratherapeutic INR - INR 4.6 on admission, peaked at 7. Given Vit K - INR 1.4 on 2/28  Malnutrition of moderate degree - Patient's BMI is Body mass index is 26.19 kg/m.. - Patient has the following signs/symptoms consistent with PCM: (fat loss, muscle loss). - seen by RD, appreciate assistance. Continue plan per RD - FLD started 2/28; I do not believe TF would add to his overall QOL or potential for improvement; poor prognosis - palliative care consulted as well   Fall at home, initial encounter - patient very debilitated and likely warrants palliative discussions - appreciate PT eval. Patient better suited for LTC at this time  Schizophrenia, unspecified ([place]) - Wellbutrin and Zyprexa on hold to allow improvement in mentation further  Generalized anxiety disorder - Continue clonazepam, will need to be slowly weaned off  Dyslipidemia -  No further mortality benefit to continuing statin - Discontinue statin  Chronic pain disorder - Database reviewed.  No chronic opioids noted - Continue holding off on scheduled opioids  Chronic kidney disease, stage 3a ([place]) - patient has history of CKD3a. Baseline creat ~  1.2, eGFR~ 50-55   Bipolar 2 disorder ([place]) - Patient had been very somnolent and sedated on admission.  Allowing further resolution of mentation -On hold currently: Wellbutrin, Zyprexa  Anxiety - clonazepam resumed  Seizure disorder ([place]) - resume keppra - resume clonazepam at daily dosing; needs to be slowly weaned  Essential hypertension - normal BP now - will resume home meds as necessary; keep holding for now  Acute metabolic encephalopathy-resolved as of 01/28/2023 - patient symptoms include AMS - etiology considered due to metabolic/septic for etiology     Old records reviewed in assessment of this patient  Antimicrobials: Rocephin 01/26/2023 >> current  DVT prophylaxis:  SCDs Start: 01/26/23 0845 warfarin (COUMADIN) tablet 2 mg   Code Status:   Code Status: DNR  Mobility Assessment (last 72 hours)     Mobility Assessment     Row Name 01/29/23 0103 01/28/23 1000         Does patient have an order for bedrest or is patient medically unstable Yes- Bedfast (Level 1) - Complete --      What is the highest level of mobility based on the progressive mobility assessment? Level 1 (Bedfast) - Unable to balance while sitting on edge of bed Level 1 (Bedfast) - Unable to balance while sitting on edge of bed      Is the above level different from baseline mobility prior to current illness? Yes - Recommend PT order --               Barriers to discharge:  Disposition Plan: Pending palliative discussions Status is: Inpatient  Objective: Blood pressure (!) 158/74, pulse 87, temperature 97.6 F (36.4 C), temperature source Oral, resp. rate 16, height '5\' 10"'$  (1.778 m), weight 84.6 kg, SpO2 100 %.  Examination:  Physical Exam Constitutional:      Comments: Chronically ill-appearing elderly gentleman lying in bed appearing even more fatigued and lethargic this morning.  Speech even more dysarthric and too difficult to understand  HENT:     Head: Normocephalic and  atraumatic.     Mouth/Throat:     Mouth: Mucous membranes are moist.  Eyes:     Extraocular Movements: Extraocular movements intact.  Cardiovascular:     Rate and Rhythm: Normal rate and regular rhythm.  Pulmonary:     Effort: Pulmonary effort is normal. No respiratory distress.     Breath sounds: Normal breath sounds. No wheezing.  Abdominal:     General: Bowel sounds are normal. There is no distension.     Palpations: Abdomen is soft.     Tenderness: There is no abdominal tenderness.  Musculoskeletal:        General: No swelling.     Cervical back: Normal range of motion and neck supple.  Skin:    General: Skin is warm and dry.  Neurological:     Comments: Follows commands and moves all 4 extremities.  Dysarthric speech appreciated.  Globally weak      Consultants:  Palliative care  Procedures:    Data Reviewed: Results for orders placed or performed during the hospital encounter of 01/25/23 (from the past 24 hour(s))  Glucose, capillary     Status: None   Collection Time: 01/28/23  3:40  PM  Result Value Ref Range   Glucose-Capillary 95 70 - 99 mg/dL  Glucose, capillary     Status: None   Collection Time: 01/28/23  8:04 PM  Result Value Ref Range   Glucose-Capillary 96 70 - 99 mg/dL  Glucose, capillary     Status: None   Collection Time: 01/28/23 11:43 PM  Result Value Ref Range   Glucose-Capillary 99 70 - 99 mg/dL  Glucose, capillary     Status: Abnormal   Collection Time: 01/29/23 12:48 AM  Result Value Ref Range   Glucose-Capillary 110 (H) 70 - 99 mg/dL  Protime-INR     Status: Abnormal   Collection Time: 01/29/23  3:19 AM  Result Value Ref Range   Prothrombin Time 15.7 (H) 11.4 - 15.2 seconds   INR 1.3 (H) 0.8 - 1.2  Basic metabolic panel     Status: Abnormal   Collection Time: 01/29/23  3:19 AM  Result Value Ref Range   Sodium 142 135 - 145 mmol/L   Potassium 3.7 3.5 - 5.1 mmol/L   Chloride 114 (H) 98 - 111 mmol/L   CO2 19 (L) 22 - 32 mmol/L    Glucose, Bld 133 (H) 70 - 99 mg/dL   BUN 11 8 - 23 mg/dL   Creatinine, Ser 0.95 0.61 - 1.24 mg/dL   Calcium 8.2 (L) 8.9 - 10.3 mg/dL   GFR, Estimated >60 >60 mL/min   Anion gap 9 5 - 15  CBC with Differential/Platelet     Status: Abnormal   Collection Time: 01/29/23  3:19 AM  Result Value Ref Range   WBC 13.8 (H) 4.0 - 10.5 K/uL   RBC 4.41 4.22 - 5.81 MIL/uL   Hemoglobin 12.5 (L) 13.0 - 17.0 g/dL   HCT 39.4 39.0 - 52.0 %   MCV 89.3 80.0 - 100.0 fL   MCH 28.3 26.0 - 34.0 pg   MCHC 31.7 30.0 - 36.0 g/dL   RDW 16.9 (H) 11.5 - 15.5 %   Platelets 230 150 - 400 K/uL   nRBC 0.0 0.0 - 0.2 %   Neutrophils Relative % 82 %   Neutro Abs 11.2 (H) 1.7 - 7.7 K/uL   Lymphocytes Relative 12 %   Lymphs Abs 1.6 0.7 - 4.0 K/uL   Monocytes Relative 5 %   Monocytes Absolute 0.6 0.1 - 1.0 K/uL   Eosinophils Relative 1 %   Eosinophils Absolute 0.2 0.0 - 0.5 K/uL   Basophils Relative 0 %   Basophils Absolute 0.0 0.0 - 0.1 K/uL   Immature Granulocytes 0 %   Abs Immature Granulocytes 0.06 0.00 - 0.07 K/uL  Magnesium     Status: None   Collection Time: 01/29/23  3:19 AM  Result Value Ref Range   Magnesium 2.2 1.7 - 2.4 mg/dL  Glucose, capillary     Status: Abnormal   Collection Time: 01/29/23  5:35 AM  Result Value Ref Range   Glucose-Capillary 125 (H) 70 - 99 mg/dL  Glucose, capillary     Status: Abnormal   Collection Time: 01/29/23  8:53 AM  Result Value Ref Range   Glucose-Capillary 125 (H) 70 - 99 mg/dL    I have reviewed pertinent nursing notes, vitals, labs, and images as necessary. I have ordered labwork to follow up on as indicated.  I have reviewed the last notes from staff over past 24 hours. I have discussed patient's care plan and test results with nursing staff, CM/SW, and other staff as appropriate.  Time spent:  Greater than 50% of the 55 minute visit was spent in counseling/coordination of care for the patient as laid out in the A&P.   LOS: 3 days   Dwyane Dee, MD [facility] 01/29/2023, 2:26 PM

## 2023-01-29 NOTE — Progress Notes (Signed)
Speech Language Pathology Treatment: Dysphagia  Patient Details Name: Robert Fisher MRN: [id number] DOB: Jan 23, 1934 Today's Date: 01/29/2023 Time: 0900-0911 SLP Time Calculation (min) (ACUTE ONLY): 11 min  Assessment / Plan / Recommendation Clinical Impression  MD present on SLP's arrival stating that pt "looks worse than yesterday." Sleeping with open mouth posture and dry mucosa and therapist provided oral care. He was sleepy but easily awakened with dysarthric speech. He is showing signs of aspiration today and suspect he has decreased oral cohesion/control and laryngeal closure leading to immediate coughs with thin liquids, worse with straw presentation. Bites ice cream consumed without cough. Pt is being followed by Palliative care who will see pt today. MD reported speaking with daughter and recommended comfort for pt. This therapist agrees he may benefit from comfort feeds versus aggressive work up of dysphagia at this point. Given oral limitations and clinical signs, higher textures were not attempted. SLP recommends continuing full liquids at this point and will follow along for ability to upgrade to solids and will follow along discussions with Palliative care. Limit straw use, crush meds and sit upright.   HPI HPI: Patient is an 89 y.o. male with PMH: HTN, HLD, DM-2, COPD, dementia, schizophrenia and h/o lung cancer in 2020. He presented to the hospital on 01/25/23 after accidental activation of his lift chair which threw him forward. In ED, ortho cleared him, he had urinary retention requiring cath and ultimately requiring Urology consult to place Foley. UA was suggestive of infection and he was admitted with urosepsis.      SLP Plan  Continue with current plan of care      Recommendations for follow up therapy are one component of a multi-disciplinary discharge planning process, led by the attending physician.  Recommendations may be updated based on patient status, additional  functional criteria and insurance authorization.    Recommendations  Diet recommendations: Thin liquid;Other(comment) (full liquids) Liquids provided via: Cup;No straw Medication Administration: Crushed with puree Supervision: Staff to assist with self feeding;Full supervision/cueing for compensatory strategies Compensations: Slow rate;Small sips/bites Postural Changes and/or Swallow Maneuvers: Seated upright 90 degrees                Oral Care Recommendations: Oral care QID Follow Up Recommendations: No SLP follow up Assistance recommended at discharge: None SLP Visit Diagnosis: Dysphagia, unspecified (R13.10) Plan: Continue with current plan of care           Houston Siren  01/29/2023, 9:23 AM

## 2023-01-29 NOTE — Progress Notes (Signed)
   01/29/23 1030  Spiritual Encounters  Type of Visit Initial  Care provided to: Patient  Conversation partners present during encounter Nurse  Referral source Patient request  Reason for visit Routine spiritual support  OnCall Visit No  Spiritual Framework  Presenting Themes Meaning/purpose/sources of inspiration  Community/Connection Family  Interventions  Spiritual Care Interventions Made Established relationship of care and support;Compassionate presence;Reflective listening;Prayer   Chp visited pt due to spiritual consult. Chp learned from RN prior to visit that patient is hard of hearing and hears better on his left side. Chp spoke with pt and learned he was a life long [place] and attended every week with his wife.  Pt was struggling to speak  and get tired from it. I offered prayer and he accepted with tears in his eyes.  Chp took hold of his hands and prayed.  Pt. Fell asleep during prayer and Chp quietly dismissed himself.

## 2023-01-29 NOTE — Progress Notes (Signed)
Daily Progress Note   Patient Name: Robert Fisher       Date: 01/29/2023 DOB: 1934-03-07  Age: 89 y.o. MRN#: [id number] Attending Physician: Robert Dee, MD Primary Care Physician: Robert Baton, MD Admit Date: 01/25/2023 Length of Stay: 3 days  Reason for Consultation/Follow-up: Establishing goals of care  HPI/Patient Profile:  89 y.o. male  with past medical history of dementia, paranoid schizophrenia, seizure disorder, HTN, HLD, DMII, COPD, lung cancer, former tobacco use, history of DVT/PE on chronic Coumadin, chronic ambulatory dysfunction who initially presented to the hospital on 01/25/2023 after falling out of a chair. He was admitted on 01/25/2023 with septic shock, acute cystitis, hematuria, failure to thrive malnutrition, fall at home.    PMT was consulted for Robert Fisher conversations.  Subjective:   Subjective: Chart Reviewed. Updates received. Patient Assessed. Created space and opportunity for patient  and family to explore thoughts and feelings regarding current medical situation.  Today's Discussion: I saw the patient at the bedside, no family present. Nurse was giving patient care. Patient is difficult to understand due to dysarthria. SLP messaged with concerns of aspiration on thin liquids. Patient says he's hard of hearing. Denies pain, N/V, dyspnea. States family hasn't been here today (when asked). Nurse confirms she has not seen family today.  Unsuccessful in contacting patient's daughter to further discuss.  Review of Systems  Constitutional:        Denies pain in general  Respiratory:  Negative for shortness of breath.   Gastrointestinal:  Negative for abdominal pain, nausea and vomiting.    Objective:   Vital Signs:  BP (!) 151/75 (BP Location: Right Arm)   Pulse 79   Temp 97.9 F (36.6 C)   Resp 19   Ht '5\' 10"'$  (1.778 m)   Wt 84.6 kg   SpO2 98%   BMI 26.76 kg/m   Physical Exam: Physical Exam Vitals and nursing note reviewed.  Constitutional:       General: He is not in acute distress.    Appearance: He is ill-appearing.  HENT:     Head: Normocephalic and atraumatic.  Cardiovascular:     Rate and Rhythm: Normal rate.  Pulmonary:     Effort: Pulmonary effort is normal. No respiratory distress.  Abdominal:     General: Abdomen is flat.     Palpations: Abdomen is soft.  Skin:    General: Skin is warm and dry.  Neurological:     Mental Status: He is alert.     Comments: Noted dysarthria  Psychiatric:        Mood and Affect: Mood normal.        Behavior: Behavior normal.     Palliative Assessment/Data: 20-30%    Existing Vynca/ACP Documentation: MOST form completed 06/19/2022   MOST form previously completed. The patient and family outlined their wishes for the following treatment decisions:  Cardiopulmonary Resuscitation: Do Not Attempt Resuscitation (DNR/No CPR)  Medical Interventions: Comfort Measures: Keep clean, warm, and dry. Use medication by any route, positioning, wound care, and other measures to relieve pain and suffering. Use oxygen, suction and manual treatment of airway obstruction as needed for comfort. Do not transfer to the hospital unless comfort needs cannot be met in current location.  Antibiotics: Determine use of limitation of antibiotics when infection occurs  IV Fluids: IV fluids for a defined trial period  Feeding Tube: No feeding tube     Assessment & Plan:   Impression: Present on Admission:  Hematuria  Adult failure to  thrive  Anxiety  Bipolar 2 disorder (HCC)  Chronic kidney disease, stage 3a (HCC)  Chronic pain disorder  Dyslipidemia  Supratherapeutic INR  Essential hypertension  Generalized anxiety disorder  Malignant neoplasm of upper lobe, left bronchus or lung (HCC)  Schizophrenia, unspecified ([place])  Acute cystitis  SUMMARY OF RECOMMENDATIONS   Remain DNR Continued [place] discussions regarding poor prognosis Plan to review MOST form with family that was previously completed,  when able to meet with them PMT will continue to follow  Symptom Management:  Per primary team PMT is available to assist as needed  Code Status: DNR  Prognosis: Unable to determine  Discharge Planning: To Be Determined  Discussed with: Patient, medical team, nursing team  Thank you for allowing [place] to participate in the care of Robert Fisher PMT will continue to support holistically.  Time Total: 45 min  Visit consisted of counseling and education dealing with the complex and emotionally intense issues of symptom management and palliative care in the setting of serious and potentially life-threatening illness. Greater than 50%  of this time was spent counseling and coordinating care related to the above assessment and plan.  Robert Field, NP Palliative Medicine Team  Team Phone # [phone] (Nights/Weekends)  07/30/2021, 8:17 AM

## 2023-01-29 NOTE — Progress Notes (Signed)
Patient transferred to the unit from 81M, lethargic but responds to voice and pain, slurred speech. Foley catheter and rectal pouch in place. Pt is placed to cardiac monitoring per order.

## 2023-01-29 NOTE — Progress Notes (Addendum)
ANTICOAGULATION CONSULT NOTE - Initial Consult  Pharmacy Consult for warfarin Indication: h/o DVT with lung cancer   Allergies  Allergen Reactions   Haloperidol Lactate Other (See Comments)    "Made him climb the walls."   Carbamazepine Other (See Comments)    Caused seizures    Sildenafil Other (See Comments)    "pain in right thigh."    Patient Measurements: Height: '5\' 10"'$  (177.8 cm) Weight: 84.6 kg (186 lb 8.2 oz) IBW/kg (Calculated) : 73  Vital Signs: Temp: 97.9 F (36.6 C) (02/29 0050) Temp Source: Axillary (02/28 2344) BP: 147/67 (02/29 0511) Pulse Rate: 69 (02/29 0050)  Labs: Recent Labs    01/26/23 1124 01/26/23 2251 01/27/23 0132 01/27/23 2052 01/28/23 0049 01/28/23 0842 01/29/23 0319  HGB  --    < > 11.0*  --   --  11.3* 12.5*  HCT  --   --  35.6*  --   --  35.5* 39.4  PLT  --   --  233  --   --  203 230  LABPROT  --   --  25.5*  --  17.0*  --  15.7*  INR  --   --  2.3*  --  1.4*  --  1.3*  CREATININE  --    < > 1.34*   < > 1.20 1.21 0.95  CKTOTAL  --   --  260  --   --   --   --   TROPONINIHS 135*  --   --   --   --   --   --    < > = values in this interval not displayed.     Estimated Creatinine Clearance: 57.6 mL/min (by C-G formula based on SCr of 0.95 mg/dL).   Medical History: Past Medical History:  Diagnosis Date   Borderline diabetes    takes Metformin   Cancer ([place])    lung 2020 per spouse LUL   COPD (chronic obstructive pulmonary disease) ([place])    Diabetes mellitus without complication ([place])    DVT (deep venous thrombosis) ([place])    Full dentures    Hearing aid worn    B/L   HOH (hard of hearing)    Hypertension    Left upper lobe pulmonary nodule    Memory problem    Pneumonia    Schizo affective schizophrenia ([place])    Seizure ([place]) 05/21/2022   Seizures ([place])     Assessment: Patient with history of remote DVT and history of lung cancer. INR acutely elevated this admission in setting of sepsis, INR up to 7 on 2/26 for  which Vitamin K 2.5 mg IV was administered. Noted to have some bleeding around his foley site earlier in admission, but has since resolved per d/w RN this morning.  INR SUBtherapeutic at 1.3 today. Note, patient will likely have a delayed response to warfarin administration in setting of recent Vitamin K administration.   Patient is on a full liquid diet, but poor PO intake  PTA warfarin regimen: '2mg'$  daily, except 4 mg on Tuesdays (as reported by daughter)   Goal of Therapy:  INR 2-3 Monitor platelets by anticoagulation protocol: Yes   Plan:  Warfarin 2 mg x1 (per PTA regimen) - cautiously resuming given recent hematuria, elevated INR on admit, and poor PO intake CBC, INR daily    Dimple Nanas, PharmD, BCPS 01/29/2023 7:20 AM

## 2023-01-29 NOTE — Progress Notes (Addendum)
5 beats of Vtach noted on monitor when trying to awake the PT with rubbing chest. Pt responds with grimacing without verbal feedback. RN stayed at bedside and didn't assess any other distress. RN will continue to monitor.    0001 12 beats of vTach verified by CMS.   Provider contacted to notify. Verbal orders given to have labs drawn early. RN contacted lab to follow order. VVS updated at this time

## 2023-01-30 DIAGNOSIS — Z7189 Other specified counseling: Secondary | ICD-10-CM | POA: Diagnosis not present

## 2023-01-30 DIAGNOSIS — A419 Sepsis, unspecified organism: Secondary | ICD-10-CM | POA: Diagnosis not present

## 2023-01-30 DIAGNOSIS — Z515 Encounter for palliative care: Secondary | ICD-10-CM | POA: Diagnosis not present

## 2023-01-30 DIAGNOSIS — R319 Hematuria, unspecified: Secondary | ICD-10-CM | POA: Diagnosis not present

## 2023-01-30 DIAGNOSIS — E440 Moderate protein-calorie malnutrition: Secondary | ICD-10-CM

## 2023-01-30 DIAGNOSIS — Z86711 Personal history of pulmonary embolism: Secondary | ICD-10-CM | POA: Diagnosis not present

## 2023-01-30 DIAGNOSIS — R627 Adult failure to thrive: Secondary | ICD-10-CM | POA: Diagnosis not present

## 2023-01-30 DIAGNOSIS — G9341 Metabolic encephalopathy: Secondary | ICD-10-CM

## 2023-01-30 LAB — CULTURE, BLOOD (ROUTINE X 2)
Culture: NO GROWTH
Culture: NO GROWTH
Special Requests: ADEQUATE

## 2023-01-30 LAB — CBC WITH DIFFERENTIAL/PLATELET
Abs Immature Granulocytes: 0.05 10*3/uL (ref 0.00–0.07)
Basophils Absolute: 0 10*3/uL (ref 0.0–0.1)
Basophils Relative: 0 %
Eosinophils Absolute: 0.2 10*3/uL (ref 0.0–0.5)
Eosinophils Relative: 2 %
HCT: 41.4 % (ref 39.0–52.0)
Hemoglobin: 12.9 g/dL — ABNORMAL LOW (ref 13.0–17.0)
Immature Granulocytes: 1 %
Lymphocytes Relative: 20 %
Lymphs Abs: 1.8 10*3/uL (ref 0.7–4.0)
MCH: 28 pg (ref 26.0–34.0)
MCHC: 31.2 g/dL (ref 30.0–36.0)
MCV: 90 fL (ref 80.0–100.0)
Monocytes Absolute: 0.7 10*3/uL (ref 0.1–1.0)
Monocytes Relative: 8 %
Neutro Abs: 6.3 10*3/uL (ref 1.7–7.7)
Neutrophils Relative %: 69 %
Platelets: 244 10*3/uL (ref 150–400)
RBC: 4.6 MIL/uL (ref 4.22–5.81)
RDW: 16.7 % — ABNORMAL HIGH (ref 11.5–15.5)
WBC: 9.1 10*3/uL (ref 4.0–10.5)
nRBC: 0 % (ref 0.0–0.2)

## 2023-01-30 LAB — BASIC METABOLIC PANEL
Anion gap: 9 (ref 5–15)
BUN: 9 mg/dL (ref 8–23)
CO2: 22 mmol/L (ref 22–32)
Calcium: 8.4 mg/dL — ABNORMAL LOW (ref 8.9–10.3)
Chloride: 112 mmol/L — ABNORMAL HIGH (ref 98–111)
Creatinine, Ser: 0.89 mg/dL (ref 0.61–1.24)
GFR, Estimated: >60 mL/min (ref >60)
Glucose, Bld: 115 mg/dL — ABNORMAL HIGH (ref 70–99)
Potassium: 3.5 mmol/L (ref 3.5–5.1)
Sodium: 143 mmol/L (ref 135–145)

## 2023-01-30 LAB — GLUCOSE, CAPILLARY
Glucose-Capillary: 110 mg/dL — ABNORMAL HIGH (ref 70–99)
Glucose-Capillary: 115 mg/dL — ABNORMAL HIGH (ref 70–99)
Glucose-Capillary: 139 mg/dL — ABNORMAL HIGH (ref 70–99)
Glucose-Capillary: 143 mg/dL — ABNORMAL HIGH (ref 70–99)

## 2023-01-30 LAB — PROTIME-INR
INR: 1.5 — ABNORMAL HIGH (ref 0.8–1.2)
Prothrombin Time: 18.4 seconds — ABNORMAL HIGH (ref 11.4–15.2)

## 2023-01-30 LAB — MAGNESIUM: Magnesium: 2.1 mg/dL (ref 1.7–2.4)

## 2023-01-30 MED ORDER — GLYCOPYRROLATE 1 MG PO TABS: 1.0000 mg | ORAL_TABLET | ORAL | Status: DC | PRN

## 2023-01-30 MED ORDER — POLYVINYL ALCOHOL 1.4 % OP SOLN: 1.0000 [drp] | Freq: Four times a day (QID) | OPHTHALMIC | Status: DC | PRN

## 2023-01-30 MED ORDER — BIOTENE DRY MOUTH MT LIQD: 15.0000 mL | OROMUCOSAL | Status: DC | PRN

## 2023-01-30 MED ORDER — GLYCOPYRROLATE 0.2 MG/ML IJ SOLN: 0.2000 mg | INTRAMUSCULAR | Status: DC | PRN

## 2023-01-30 MED ORDER — MORPHINE SULFATE (PF) 2 MG/ML IV SOLN
1.0000 mg | Freq: Every day | INTRAVENOUS | Status: DC
  Administered 2023-01-30 – 2023-02-01 (×3): 1 mg via INTRAVENOUS
  Filled 2023-01-30 (×3): qty 1

## 2023-01-30 MED ORDER — LORAZEPAM 2 MG/ML IJ SOLN: 0.5000 mg | INTRAMUSCULAR | Status: DC | PRN

## 2023-01-30 MED ORDER — WARFARIN SODIUM 2 MG PO TABS: 2.0000 mg | ORAL_TABLET | Freq: Once | ORAL | Status: DC

## 2023-01-30 MED ORDER — MORPHINE SULFATE (PF) 2 MG/ML IV SOLN: 1.0000 mg | INTRAVENOUS | Status: DC | PRN

## 2023-01-30 NOTE — Progress Notes (Signed)
[facility]) Hospital Liaison Note  Received request from Winferd Humphrey, [facility] for family interest in [facility]. Spoke with patient's daughter, [name], to confirm interest and explain services.  No beds at [facility] at this time.  Eligibility for [facility] pending. Hospital Liaison will continue to follow.   Family and hospital staff updated.  Please do not hesitate to call with any questions.    Thank you, Zigmund Gottron RN  [facility] Liaison [phone]

## 2023-01-30 NOTE — Progress Notes (Addendum)
Palliative Medicine Inpatient Follow Up Note HPI: 89 y.o. male  with past medical history of dementia, paranoid schizophrenia, seizure disorder, HTN, HLD, DMII, COPD, lung cancer, former tobacco use, history of DVT/PE on chronic Coumadin, chronic ambulatory dysfunction who initially presented to the hospital on 01/25/2023 after falling out of a chair. He was admitted on 01/25/2023 with septic shock, acute cystitis, hematuria, failure to thrive malnutrition, fall at home.    PMT was consulted for [place] conversations.  Today's Discussion 01/30/2023  *Please note that this is a verbal dictation therefore any spelling or grammatical errors are due to the "[place] One" system interpretation.  Chart reviewed inclusive of vital signs, progress notes, laboratory results, and diagnostic images.   I met at bedside with patients night RN. She was sharing that Galyn had a restful night. She expresses that she she cleaned him up he said, I want to go home. This was something he vocalized repeatedly.   I spoke to patients daughter, [name] this morning. She and I reviewed the conversations held previously with both Randall Hiss and Dr. Sabino Gasser. We further reviewed the idea of hospice care and how this is likely the best next step for [place] based upon how severely weak and deconditioned he is presently. He also has little PO intake.   Discussed the idea of a comfort approach to care based upon [place] prior MOST form. We talked about transition to comfort measures in house and what that would entail inclusive of medications to control pain, dyspnea, agitation, nausea, itching, and hiccups.  We discussed stopping all uneccessary measures such as cardiac monitoring, blood draws, needle sticks, and frequent vital signs. Utilized reflective listening throughout our time together as patients daughter expresses that she does not like this but understands it's the best thing for [place].   Goals geared towards  comfort care.  Questions and concerns addressed/Palliative Support Provided.   Dr. Sabino Gasser, MSA, Marya Amsler, and RN informed of the above changed.  _______________________________________________________ Addendum:  I was able to go to bedside this afternoon at 1600. Patient is resting comfortably and exemplifies no s/s of distress.   I called patients daughter, [name] as we were supposed to meet in person though she is not present at bedside. I was able to reach her by phone and she shares that she is feeling, "so overwhelmed". She shares that her mother is taking her time getting ready to come to the hospital and requests that I wait for her.   Reviewed the plan inclusive of comfort care. Discussed patients MOST form and how patients daughter has honored all of his prior wishes allowing him a course of antibiotics, IVF's, ICU stay and even pressors.  Discussed the patients present state and how we are going to honor his wishes. We reviewed the idea of dying a dignified death w/o additional interventions as per his MOST form.   Patients daughter is in agreement.  Additional Time: 45  ________________________________________________________ Addendum #2:  I met this evening with patients daughter, [name] and his wife, Caryl Asp. We discussed in detail his decline over the past 1.5 yrs. We reviewed the chronic disease trajectory. Discussed how patients MOST from 2023 outlines his wishes clearly.  Patients daughter expressed great emotion during my time with her. Patients wife is in full agreement with the plan.   Reviewed the plan from here for inpatient hospice placement.   Spent a great deal of time allowing patients daughter the ability to express how trial-some the last 3.5 years of  caring for her parents have been. Assured her that according to patients previously stated wishes she is honoring what he would have wanted.   Offered therapeutic listening as a form of empathetic support.    Additional Time: 54 minutes   Objective Assessment: Vital Signs Vitals:   01/30/23 0014 01/30/23 0327  BP: (!) 155/76 (!) 159/63  Pulse: 72 71  Resp: 14 16  Temp: 97.8 F (36.6 C) 97.9 F (36.6 C)  SpO2: 99% 99%    Intake/Output Summary (Last 24 hours) at 01/30/2023 [id number] Last data filed at 01/29/2023 2306 Gross per 24 hour  Intake 203.64 ml  Output 350 ml  Net -146.36 ml   Last Weight  Most recent update: 01/30/2023  5:04 AM    Weight  84.6 kg (186 lb 8.2 oz)            Gen:  Elderly Frail caucasian M in NAD HEENT: Dry mucous membranes CV: Regular rate and rhythm  PULM: On RA,. Breathing is even and nonlabored ABD: soft/nontender  EXT: No edema  Neuro: Somnolent though will arouse when stimulated, (+) dysarthria  SUMMARY OF RECOMMENDATIONS   DNAR/DNI  Comfort Care at this time  Comfort medications per [facility]  H/O chronic LBP  - Added morphine QHS   TOC for IP Hospice referral  Ongoing PMT support  Billing based on MDM: High ______________________________________________________________________________________ [place] Team Team Cell Phone: [phone] Please utilize secure chat with additional questions, if there is no response within 30 minutes please call the above phone number  Palliative Medicine Team providers are available by phone from 7am to 7pm daily and can be reached through the team cell phone.  Should this patient require assistance outside of these hours, please call the patient's attending physician.

## 2023-01-30 NOTE — Progress Notes (Signed)
Progress Note    Robert Fisher   [id number]  DOB: 01/31/1934  DOA: 01/25/2023     4 PCP: Shon Baton, MD  Initial CC: fall  Hospital Course: Robert Fisher is an 89 yo male with PMH dementia, paranoid schizophrenia, seizure disorder, HTN, HLD, DMII, COPD, lung cancer, former tobacco use, history of DVT/PE on chronic Coumadin, chronic ambulatory dysfunction who initially presented to the hospital on 01/25/2023 after falling out of a chair.  He was initially evaluated by orthopedic surgery and cleared. Further workup revealed septic shock due to UTI requiring vasopressor use. He also had significant urinary retention requiring urology assistance for Foley catheter placement. Due to his overall generalized deconditioning and what appears to be progressive functional decline, palliative care was also consulted.  Interval History:  No events overnight.  He was actually a little more awake and alert today.  Still not taking in much nutrition which ultimately will be a prognosticating factor. Palliative care has spoken with patient's daughter and she has elected for transitioning to comfort care.  Assessment and Plan: * Septic shock (HCC)-resolved as of 01/28/2023 - Treated in the ICU after admission due to refractory hypotension - levophed has been weaned off - Urine culture grew Klebsiella, patient treated with Rocephin  Acute cystitis - See septic shock - s/p Rocephin course for Klebsiella noted in urine culture  History of pulmonary embolism - has been on coumadin for hx PE; need to still investigate timing of events and if plan truly was indefinite anticoagulation - INR has normalized and no overt bleeding -Patient now transitioning to comfort care.  Okay to discontinue Coumadin  Hematuria - suspect traumatic foley attempts - continue foley for now, possibly chronically but more than likely will need several days of bladder rest as well as potential reconditioning which seems  less likely  Adult failure to thrive - patient appears to have poor QOL at baseline and there's also concern for adequate care at home  - palliative care consulted for further [place] discussions  - patient did pass SLP eval on 2/28 and trial of FLD being started  -He continues to not show signs of improvement and that will become meaningful.  Nutrition status will also become a large factor as time continues.  He does have a MOST form and also designates that he would not want a feeding tube. -Palliative care has spoken more with patient's daughter on 01/30/2023 and transition to comfort care has been decided upon  Supratherapeutic INR - INR 4.6 on admission, peaked at 7. Given Vit K - INR 1.4 on 2/28  Malnutrition of moderate degree - Patient's BMI is Body mass index is 26.19 kg/m.. - Patient has the following signs/symptoms consistent with PCM: (fat loss, muscle loss). - seen by RD, appreciate assistance. Continue plan per RD - FLD started 2/28; I do not believe TF would add to his overall QOL or potential for improvement; poor prognosis; MOST form confirms patient to have no TF as well   Fall at home, initial encounter - patient very debilitated and likely warrants palliative discussions - see Failure to thrive  Schizophrenia, unspecified (HCC) - Wellbutrin and Zyprexa on hold to allow improvement in mentation further  Generalized anxiety disorder - Continue clonazepam  Dyslipidemia - No further mortality benefit to continuing statin - Discontinue statin  Chronic pain disorder - Database reviewed.  No chronic opioids noted -Now in setting of comfort care, okay for morphine for any pain or discomfort  Chronic kidney disease,  stage 3a [facility]) - patient has history of CKD3a. Baseline creat ~ 1.2, eGFR~ 50-55   Bipolar 2 disorder ([place]) - Patient had been very somnolent and sedated on admission.  Allowing further resolution of mentation -On hold currently: Wellbutrin,  Zyprexa  Anxiety - clonazepam resumed  Seizure disorder ([place]) - resume keppra - resume clonazepam at daily dosing; okay to continue in setting of comfort care with no weaning necessary  Essential hypertension - normal BP now -Continue comfort care  Acute metabolic encephalopathy-resolved as of 01/28/2023 - patient symptoms include AMS - etiology considered due to metabolic/septic for etiology     Old records reviewed in assessment of this patient  Antimicrobials: Rocephin 01/26/2023 >> 3/1  DVT prophylaxis:  SCDs Start: 01/26/23 0845   Code Status:   Code Status: DNR  Mobility Assessment (last 72 hours)     Mobility Assessment     Row Name 01/29/23 2200 01/29/23 0757 01/29/23 0103 01/28/23 1000     Does patient have an order for bedrest or is patient medically unstable Yes- Bedfast (Level 1) - Complete Yes- Bedfast (Level 1) - Complete Yes- Bedfast (Level 1) - Complete --    What is the highest level of mobility based on the progressive mobility assessment? Level 1 (Bedfast) - Unable to balance while sitting on edge of bed Level 1 (Bedfast) - Unable to balance while sitting on edge of bed Level 1 (Bedfast) - Unable to balance while sitting on edge of bed Level 1 (Bedfast) - Unable to balance while sitting on edge of bed    Is the above level different from baseline mobility prior to current illness? -- Yes - Recommend PT order Yes - Recommend PT order --             Barriers to discharge:  Disposition Plan: Comfort care, residential hospice if able to be found  Status is: Inpatient  Objective: Blood pressure (!) 152/68, pulse 71, temperature 98 F (36.7 C), temperature source Oral, resp. rate 17, height '5\' 10"'$  (1.778 m), weight 84.6 kg, SpO2 99 %.  Examination:  Physical Exam Constitutional:      Comments: Chronically ill-appearing elderly gentleman lying in bed appearing fatigued and lethargic this morning.  Speech still very dysarthric  HENT:     Head:  Normocephalic and atraumatic.     Mouth/Throat:     Mouth: Mucous membranes are moist.  Eyes:     Extraocular Movements: Extraocular movements intact.  Cardiovascular:     Rate and Rhythm: Normal rate and regular rhythm.  Pulmonary:     Effort: Pulmonary effort is normal. No respiratory distress.     Breath sounds: Normal breath sounds. No wheezing.  Abdominal:     General: Bowel sounds are normal. There is no distension.     Palpations: Abdomen is soft.     Tenderness: There is no abdominal tenderness.  Musculoskeletal:        General: No swelling.     Cervical back: Normal range of motion and neck supple.  Skin:    General: Skin is warm and dry.  Neurological:     Comments: Follows commands and moves all 4 extremities.  Dysarthric speech appreciated.  Globally weak      Consultants:  Palliative care  Procedures:    Data Reviewed: Results for orders placed or performed during the hospital encounter of 01/25/23 (from the past 24 hour(s))  Glucose, capillary     Status: None   Collection Time: 01/29/23  4:08 PM  Result Value Ref Range   Glucose-Capillary 96 70 - 99 mg/dL   Comment 1 Notify RN   Glucose, capillary     Status: Abnormal   Collection Time: 01/29/23  8:24 PM  Result Value Ref Range   Glucose-Capillary 100 (H) 70 - 99 mg/dL  Glucose, capillary     Status: Abnormal   Collection Time: 01/29/23 11:56 PM  Result Value Ref Range   Glucose-Capillary 105 (H) 70 - 99 mg/dL  Protime-INR     Status: Abnormal   Collection Time: 01/30/23  1:26 AM  Result Value Ref Range   Prothrombin Time 18.4 (H) 11.4 - 15.2 seconds   INR 1.5 (H) 0.8 - 1.2  Basic metabolic panel     Status: Abnormal   Collection Time: 01/30/23  1:26 AM  Result Value Ref Range   Sodium 143 135 - 145 mmol/L   Potassium 3.5 3.5 - 5.1 mmol/L   Chloride 112 (H) 98 - 111 mmol/L   CO2 22 22 - 32 mmol/L   Glucose, Bld 115 (H) 70 - 99 mg/dL   BUN 9 8 - 23 mg/dL   Creatinine, Ser 0.89 0.61 - 1.24 mg/dL    Calcium 8.4 (L) 8.9 - 10.3 mg/dL   GFR, Estimated >60 >60 mL/min   Anion gap 9 5 - 15  CBC with Differential/Platelet     Status: Abnormal   Collection Time: 01/30/23  1:26 AM  Result Value Ref Range   WBC 9.1 4.0 - 10.5 K/uL   RBC 4.60 4.22 - 5.81 MIL/uL   Hemoglobin 12.9 (L) 13.0 - 17.0 g/dL   HCT 41.4 39.0 - 52.0 %   MCV 90.0 80.0 - 100.0 fL   MCH 28.0 26.0 - 34.0 pg   MCHC 31.2 30.0 - 36.0 g/dL   RDW 16.7 (H) 11.5 - 15.5 %   Platelets 244 150 - 400 K/uL   nRBC 0.0 0.0 - 0.2 %   Neutrophils Relative % 69 %   Neutro Abs 6.3 1.7 - 7.7 K/uL   Lymphocytes Relative 20 %   Lymphs Abs 1.8 0.7 - 4.0 K/uL   Monocytes Relative 8 %   Monocytes Absolute 0.7 0.1 - 1.0 K/uL   Eosinophils Relative 2 %   Eosinophils Absolute 0.2 0.0 - 0.5 K/uL   Basophils Relative 0 %   Basophils Absolute 0.0 0.0 - 0.1 K/uL   Immature Granulocytes 1 %   Abs Immature Granulocytes 0.05 0.00 - 0.07 K/uL  Magnesium     Status: None   Collection Time: 01/30/23  1:26 AM  Result Value Ref Range   Magnesium 2.1 1.7 - 2.4 mg/dL  Glucose, capillary     Status: Abnormal   Collection Time: 01/30/23  4:50 AM  Result Value Ref Range   Glucose-Capillary 110 (H) 70 - 99 mg/dL  Glucose, capillary     Status: Abnormal   Collection Time: 01/30/23  8:28 AM  Result Value Ref Range   Glucose-Capillary 115 (H) 70 - 99 mg/dL  Glucose, capillary     Status: Abnormal   Collection Time: 01/30/23 12:07 PM  Result Value Ref Range   Glucose-Capillary 139 (H) 70 - 99 mg/dL    I have reviewed pertinent nursing notes, vitals, labs, and images as necessary. I have ordered labwork to follow up on as indicated.  I have reviewed the last notes from staff over past 24 hours. I have discussed patient's care plan and test results with nursing staff, CM/SW, and other staff as  appropriate.  Time spent: Greater than 50% of the 55 minute visit was spent in counseling/coordination of care for the patient as laid out in the A&P.   LOS:  4 days   Dwyane Dee, MD [facility] 01/30/2023, 3:36 PM

## 2023-01-30 NOTE — TOC Progression Note (Signed)
Transition of Care [facility]) - Progression Note    Patient Details  Name: Robert Fisher MRN: [id number] Date of Birth: 03/11/1934  Transition [facility]) CM/SW Contact  [name], LCSW Phone Number: 01/30/2023, 10:36 AM  Clinical Narrative:   CSW informed by Michelle/Palliative that family would like to pursue residential hospice care.  CSW spoke with daughter [name], choice discussed, she would be open to either [facility] or [facility].    CSW made referral to Hillary/[facility] and Sarah/[facility].  Both report no current residential bed availability.  Both will evaluate pt for residential eligibility.    Expected Discharge Plan: [place] Barriers to Discharge: Continued Medical Work up, Other (must enter comment) (pending residential hospice eligibility)  Expected Discharge Plan and Services     Post Acute Care Choice: Hospice Living arrangements for the past 2 months: Single Family Home                                       Social Determinants of Health (SDOH) Interventions SDOH Screenings   Food Insecurity: No Food Insecurity (08/28/2022)  Housing: Low Risk  (08/28/2022)  Transportation Needs: No Transportation Needs (08/28/2022)  Utilities: Not At Risk (08/28/2022)  Tobacco Use: Medium Risk (01/25/2023)    Readmission Risk Interventions     No data to display

## 2023-01-30 NOTE — Progress Notes (Addendum)
ANTICOAGULATION CONSULT NOTE - Initial Consult  Pharmacy Consult for warfarin Indication: h/o DVT with lung cancer   Allergies  Allergen Reactions   Haloperidol Lactate Other (See Comments)    "Made him climb the walls."   Carbamazepine Other (See Comments)    Caused seizures    Sildenafil Other (See Comments)    "pain in right thigh."    Patient Measurements: Height: '5\' 10"'$  (177.8 cm) Weight: 84.6 kg (186 lb 8.2 oz) IBW/kg (Calculated) : 73  Vital Signs: Temp: 97.9 F (36.6 C) (03/01 0327) Temp Source: Oral (03/01 0327) BP: 159/63 (03/01 0327) Pulse Rate: 71 (03/01 0327)  Labs: Recent Labs    01/28/23 0049 01/28/23 0049 01/28/23 0842 01/29/23 0319 01/30/23 0126  HGB  --    < > 11.3* 12.5* 12.9*  HCT  --   --  35.5* 39.4 41.4  PLT  --   --  203 230 244  LABPROT 17.0*  --   --  15.7* 18.4*  INR 1.4*  --   --  1.3* 1.5*  CREATININE 1.20  --  1.21 0.95 0.89   < > = values in this interval not displayed.     Estimated Creatinine Clearance: 61.5 mL/min (by C-G formula based on SCr of 0.89 mg/dL).   Medical History: Past Medical History:  Diagnosis Date   Borderline diabetes    takes Metformin   Cancer ([place])    lung 2020 per spouse LUL   COPD (chronic obstructive pulmonary disease) ([place])    Diabetes mellitus without complication ([place])    DVT (deep venous thrombosis) ([place])    Full dentures    Hearing aid worn    B/L   HOH (hard of hearing)    Hypertension    Left upper lobe pulmonary nodule    Memory problem    Pneumonia    Schizo affective schizophrenia ([place])    Seizure ([place]) 05/21/2022   Seizures ([place])     Assessment: Patient with history of remote DVT and history of lung cancer. INR acutely elevated this admission in setting of sepsis, INR up to 7 on 2/26 for which Vitamin K 2.5 mg IV was administered. Noted to have some bleeding around his foley site earlier in admission, but has since resolved per d/w RN.  INR SUBtherapeutic at 1.5 today. Note,  patient will likely have a delayed response to warfarin administration in setting of recent Vitamin K administration. CBC ok. Not bridging per d/w MD given poor prognosis  Patient is on a full liquid diet, but poor PO intake  PTA warfarin regimen: '2mg'$  daily, except 4 mg on Tuesdays (as reported by daughter)   Goal of Therapy:  INR 2-3 Monitor platelets by anticoagulation protocol: Yes   Plan:  Warfarin 2 mg x1 (per PTA regimen) - cautiously resuming given recent hematuria, elevated INR on admit, and poor PO intake CBC, INR daily    Dimple Nanas, PharmD, BCPS 01/30/2023 8:03 AM

## 2023-01-31 DIAGNOSIS — R319 Hematuria, unspecified: Secondary | ICD-10-CM | POA: Diagnosis not present

## 2023-01-31 DIAGNOSIS — A419 Sepsis, unspecified organism: Secondary | ICD-10-CM | POA: Diagnosis not present

## 2023-01-31 DIAGNOSIS — R627 Adult failure to thrive: Secondary | ICD-10-CM | POA: Diagnosis not present

## 2023-01-31 DIAGNOSIS — R6521 Severe sepsis with septic shock: Secondary | ICD-10-CM | POA: Diagnosis not present

## 2023-01-31 NOTE — Progress Notes (Signed)
Progress Note    Robert Fisher   [id number]  DOB: [birth year]  DOA: 01/25/2023     5 PCP: Shon Baton, MD  Initial CC: fall  Hospital Course: Robert Fisher is an 89 yo male with PMH dementia, paranoid schizophrenia, seizure disorder, HTN, HLD, DMII, COPD, lung cancer, former tobacco use, history of DVT/PE on chronic Coumadin, chronic ambulatory dysfunction who initially presented to the hospital on 01/25/2023 after falling out of a chair.  He was initially evaluated by orthopedic surgery and cleared. Further workup revealed septic shock due to UTI requiring vasopressor use. He also had significant urinary retention requiring urology assistance for Foley catheter placement. Due to his overall generalized deconditioning and what appears to be progressive functional decline, palliative care was also consulted.  Interval History:  No events overnight.  I spoke with [name] and Joy today. They do still want hospice/comfort care. They had some doubts b/c of his meal intake yesterday but [name] cannot care for him at home regardless. Their hope is for him to go to residential hospice if able. Since that might be declined for now, he may need to remain inpatient until qualifies or passes in hospital.   Assessment and Plan: * Septic shock (HCC)-resolved as of 01/28/2023 - Treated in the ICU after admission due to refractory hypotension - levophed has been weaned off - Urine culture grew Klebsiella, patient treated with Rocephin  Acute cystitis - See septic shock - s/p Rocephin course for Klebsiella noted in urine culture  History of pulmonary embolism - has been on coumadin for hx PE; need to still investigate timing of events and if plan truly was indefinite anticoagulation - INR has normalized and no overt bleeding -Patient now transitioning to comfort care.  Okay to discontinue Coumadin  Hematuria - suspect traumatic foley attempts - continue foley for now, possibly chronically but  more than likely will need several days of bladder rest as well as potential reconditioning which seems less likely  Adult failure to thrive - patient appears to have poor QOL at baseline and there's also concern for adequate care at home  - palliative care consulted for further [place] discussions  - patient did pass SLP eval on 2/28 and trial of FLD being started  -He continues to not show signs of improvement and that will become meaningful.  Nutrition status will also become a large factor as time continues.  He does have a MOST form and also designates that he would not want a feeding tube. -Palliative care has spoken more with patient's daughter on 01/30/2023 and transition to comfort care has been decided upon - family unable to provide care for patient at home even with hospice in place; appears he's equivocal for BP to admit so best interest might be for patient to remain in hospital until he qualifies for residential vs passes in hospital (his intake seems not amenable to sustain life)  Supratherapeutic INR - INR 4.6 on admission, peaked at 7. Given Vit K - INR 1.4 on 2/28  Malnutrition of moderate degree - Patient's BMI is Body mass index is 26.19 kg/m.. - Patient has the following signs/symptoms consistent with PCM: (fat loss, muscle loss). - seen by RD, appreciate assistance. Continue plan per RD - FLD started 2/28; I do not believe TF would add to his overall QOL or potential for improvement; poor prognosis; MOST form confirms patient to have no TF as well   Fall at home, initial encounter - patient very debilitated and likely  warrants palliative discussions - see Failure to thrive  Schizophrenia, unspecified ([place]) - Wellbutrin and Zyprexa on hold to allow improvement in mentation further  Generalized anxiety disorder - Continue clonazepam  Dyslipidemia - No further mortality benefit to continuing statin - Discontinue statin  Chronic pain disorder - Database reviewed.  No  chronic opioids noted -Now in setting of comfort care, okay for morphine for any pain or discomfort  Chronic kidney disease, stage 3a ([place]) - patient has history of CKD3a. Baseline creat ~ 1.2, eGFR~ 50-55   Bipolar 2 disorder ([place]) - Patient had been very somnolent and sedated on admission.  Allowing further resolution of mentation -On hold currently: Wellbutrin, Zyprexa  Anxiety - clonazepam resumed  Seizure disorder (HCC) - resume keppra - resume clonazepam at daily dosing; okay to continue in setting of comfort care with no weaning necessary  Essential hypertension - normal BP now -Continue comfort care  Acute metabolic encephalopathy-resolved as of 01/28/2023 - patient symptoms include AMS - etiology considered due to metabolic/septic for etiology     Old records reviewed in assessment of this patient  Antimicrobials: Rocephin 01/26/2023 >> 3/1  DVT prophylaxis:  SCDs Start: 01/26/23 0845   Code Status:   Code Status: DNR  Mobility Assessment (last 72 hours)     Mobility Assessment     Row Name 01/29/23 2200 01/29/23 0757 01/29/23 0103       Does patient have an order for bedrest or is patient medically unstable Yes- Bedfast (Level 1) - Complete Yes- Bedfast (Level 1) - Complete Yes- Bedfast (Level 1) - Complete     What is the highest level of mobility based on the progressive mobility assessment? Level 1 (Bedfast) - Unable to balance while sitting on edge of bed Level 1 (Bedfast) - Unable to balance while sitting on edge of bed Level 1 (Bedfast) - Unable to balance while sitting on edge of bed     Is the above level different from baseline mobility prior to current illness? -- Yes - Recommend PT order Yes - Recommend PT order              Barriers to discharge:  Disposition Plan: Comfort care, residential hospice if able to be found  Status is: Inpatient  Objective: Blood pressure (!) 148/70, pulse 71, temperature 98.1 F (36.7 C), temperature  source Oral, resp. rate 16, height '5\' 10"'$  (1.778 m), weight 83.7 kg, SpO2 100 %.  Examination:  Physical Exam Constitutional:      Comments: Chronically ill-appearing elderly gentleman lying in bed appearing fatigued. Speech still very dysarthric  HENT:     Head: Normocephalic and atraumatic.     Mouth/Throat:     Mouth: Mucous membranes are moist.  Eyes:     Extraocular Movements: Extraocular movements intact.  Cardiovascular:     Rate and Rhythm: Normal rate and regular rhythm.  Pulmonary:     Effort: Pulmonary effort is normal. No respiratory distress.     Breath sounds: Normal breath sounds. No wheezing.  Abdominal:     General: Bowel sounds are normal. There is no distension.     Palpations: Abdomen is soft.     Tenderness: There is no abdominal tenderness.  Musculoskeletal:        General: No swelling.     Cervical back: Normal range of motion and neck supple.  Skin:    General: Skin is warm and dry.  Neurological:     Comments: Follows commands and moves all 4 extremities.  Dysarthric speech appreciated.  Globally weak      Consultants:  Palliative care  Procedures:    Data Reviewed: Results for orders placed or performed during the hospital encounter of 01/25/23 (from the past 24 hour(s))  Glucose, capillary     Status: Abnormal   Collection Time: 01/30/23  8:22 PM  Result Value Ref Range   Glucose-Capillary 143 (H) 70 - 99 mg/dL    I have reviewed pertinent nursing notes, vitals, labs, and images as necessary. I have ordered labwork to follow up on as indicated.  I have reviewed the last notes from staff over past 24 hours. I have discussed patient's care plan and test results with nursing staff, CM/SW, and other staff as appropriate.  Time spent: Greater than 50% of the 55 minute visit was spent in counseling/coordination of care for the patient as laid out in the A&P.   LOS: 5 days   Dwyane Dee, MD [facility] 01/31/2023, 1:17 PM

## 2023-01-31 NOTE — Progress Notes (Signed)
[facility])  Referral received on 3.01 for [facility] to evaluate patient for BP. At this time, patient continues to not require any ongoing symptom management needs and, while minimal, continues to have ongoing intake. Eligibility remains pending.  MSW spoke with daughter/Meredith, and shared above information. [name] voiced understanding. MSW shared that if patient continues to not meet criteria for BP that services can be provided in the home or LTC placement and [facility] voiced understanding.     Please call with any questions/concerns.    Thank you for the opportunity to participate in this patient's care.   Phillis Haggis, MSW [facility] Liaison  [phone]

## 2023-01-31 NOTE — Plan of Care (Signed)
  Problem: Education: Goal: Ability to describe self-care measures that may prevent or decrease complications (Diabetes Survival Skills Education) will improve Outcome: Progressing Goal: Individualized Educational Video(s) Outcome: Progressing   Problem: Coping: Goal: Ability to adjust to condition or change in health will improve Outcome: Progressing   Problem: Fluid Volume: Goal: Ability to maintain a balanced intake and output will improve Outcome: Progressing   Problem: Health Behavior/Discharge Planning: Goal: Ability to identify and utilize available resources and services will improve Outcome: Progressing Goal: Ability to manage health-related needs will improve Outcome: Progressing   Problem: Metabolic: Goal: Ability to maintain appropriate glucose levels will improve Outcome: Progressing   Problem: Nutritional: Goal: Maintenance of adequate nutrition will improve Outcome: Progressing Goal: Progress toward achieving an optimal weight will improve Outcome: Progressing   Problem: Skin Integrity: Goal: Risk for impaired skin integrity will decrease Outcome: Progressing   Problem: Tissue Perfusion: Goal: Adequacy of tissue perfusion will improve Outcome: Progressing   Problem: Education: Goal: Knowledge of General Education information will improve Description: Including pain rating scale, medication(s)/side effects and non-pharmacologic comfort measures Outcome: Progressing   Problem: Health Behavior/Discharge Planning: Goal: Ability to manage health-related needs will improve Outcome: Progressing   Problem: Clinical Measurements: Goal: Ability to maintain clinical measurements within normal limits will improve Outcome: Progressing Goal: Will remain free from infection Outcome: Progressing Goal: Diagnostic test results will improve Outcome: Progressing Goal: Respiratory complications will improve Outcome: Progressing Goal: Cardiovascular complication will  be avoided Outcome: Progressing   Problem: Activity: Goal: Risk for activity intolerance will decrease Outcome: Progressing   Problem: Nutrition: Goal: Adequate nutrition will be maintained Outcome: Progressing   Problem: Coping: Goal: Level of anxiety will decrease Outcome: Progressing   Problem: Elimination: Goal: Will not experience complications related to bowel motility Outcome: Progressing Goal: Will not experience complications related to urinary retention Outcome: Progressing   Problem: Pain Managment: Goal: General experience of comfort will improve Outcome: Progressing   Problem: Safety: Goal: Ability to remain free from injury will improve Outcome: Progressing   Problem: Skin Integrity: Goal: Risk for impaired skin integrity will decrease Outcome: Progressing   Problem: Education: Goal: Knowledge of the prescribed therapeutic regimen will improve Outcome: Progressing   Problem: Coping: Goal: Ability to identify and develop effective coping behavior will improve Outcome: Progressing   Problem: Clinical Measurements: Goal: Quality of life will improve Outcome: Progressing   Problem: Respiratory: Goal: Verbalizations of increased ease of respirations will increase Outcome: Progressing   Problem: Role Relationship: Goal: Family's ability to cope with current situation will improve Outcome: Progressing Goal: Ability to verbalize concerns, feelings, and thoughts to partner or family member will improve Outcome: Progressing   Problem: Pain Management: Goal: Satisfaction with pain management regimen will improve Outcome: Progressing

## 2023-02-01 DIAGNOSIS — A419 Sepsis, unspecified organism: Secondary | ICD-10-CM | POA: Diagnosis not present

## 2023-02-01 DIAGNOSIS — R627 Adult failure to thrive: Secondary | ICD-10-CM | POA: Diagnosis not present

## 2023-02-01 DIAGNOSIS — R319 Hematuria, unspecified: Secondary | ICD-10-CM | POA: Diagnosis not present

## 2023-02-01 DIAGNOSIS — R6521 Severe sepsis with septic shock: Secondary | ICD-10-CM | POA: Diagnosis not present

## 2023-02-01 DIAGNOSIS — Z515 Encounter for palliative care: Secondary | ICD-10-CM | POA: Diagnosis not present

## 2023-02-01 NOTE — Progress Notes (Signed)
Progress Note    Robert Fisher   [id number]  DOB: 01/22/1934  DOA: 01/25/2023     6 PCP: Shon Baton, MD  Initial CC: fall  Hospital Course: Robert Fisher is an 89 yo male with PMH dementia, paranoid schizophrenia, seizure disorder, HTN, HLD, DMII, COPD, lung cancer, former tobacco use, history of DVT/PE on chronic Coumadin, chronic ambulatory dysfunction who initially presented to the hospital on 01/25/2023 after falling out of a chair.  He was initially evaluated by orthopedic surgery and cleared. Further workup revealed septic shock due to UTI requiring vasopressor use. He also had significant urinary retention requiring urology assistance for Foley catheter placement. Due to his overall generalized deconditioning and what appears to be progressive functional decline, palliative care was also consulted.  Interval History:  No events overnight.  Patient still not considered a residential hospice candidate but he still cannot return home with family. He's comfortable when seen this morning but grossly deconditioned and weak.  Only ate 30-40% meals over past couple days.   Assessment and Plan: * Septic shock (HCC)-resolved as of 01/28/2023 - Treated in the ICU after admission due to refractory hypotension - levophed has been weaned off - Urine culture grew Klebsiella, patient treated with Rocephin  Acute cystitis - See septic shock - s/p Rocephin course for Klebsiella noted in urine culture  History of pulmonary embolism - had been on coumadin for hx PE - INR has normalized and no overt bleeding -Patient now transitioning to comfort care.  Okay to discontinue Coumadin  Hematuria - suspect traumatic foley attempts - continue foley for now, possibly chronically but more than likely will need several days of bladder rest as well as potential reconditioning which seems less likely  Adult failure to thrive - patient appears to have poor QOL at baseline and there's also  concern for adequate care at home  - palliative care consulted for further [place] discussions  - patient did pass SLP eval on 2/28 and trial of FLD being started  -He continues to not show signs of improvement. Nutrition status will also become a large factor as time continues.  He does have a MOST form and also designates that he would not want a feeding tube. -Palliative care has spoken more with patient's daughter on 01/30/2023 and transition to comfort care has been decided upon - family unable to provide care for patient at home even with hospice in place; appears he's equivocal for BP to admit so best interest might be for patient to remain in hospital until he qualifies for residential vs passes in hospital (his intake seems not amenable to sustain life)  Supratherapeutic INR-resolved as of 02/01/2023 - INR 4.6 on admission, peaked at 7. Given Vit K - INR 1.4 on 2/28  Malnutrition of moderate degree - Patient's BMI is Body mass index is 26.19 kg/m.. - Patient has the following signs/symptoms consistent with PCM: (fat loss, muscle loss). - seen by RD, appreciate assistance. Continue plan per RD - FLD started 2/28; I do not believe TF would add to his overall QOL or potential for improvement; poor prognosis; MOST form confirms patient to have no TF as well   Fall at home, initial encounter - patient very debilitated and likely warrants palliative discussions - see Failure to thrive  Schizophrenia, unspecified (HCC) - Wellbutrin and Zyprexa on hold to allow improvement in mentation further  Generalized anxiety disorder - Continue clonazepam  Dyslipidemia - No further mortality benefit to continuing statin - Discontinue statin  Chronic pain disorder - Database reviewed.  No chronic opioids noted -Now in setting of comfort care, okay for morphine for any pain or discomfort  Chronic kidney disease, stage 3a ([place]) - patient has history of CKD3a. Baseline creat ~ 1.2, eGFR~  50-55   Bipolar 2 disorder ([place]) - Patient had been very somnolent and sedated on admission.  Allowing further resolution of mentation -On hold currently: Wellbutrin, Zyprexa  Anxiety - clonazepam resumed  Seizure disorder (HCC) - resume keppra - resume clonazepam at daily dosing; okay to continue in setting of comfort care with no weaning necessary  Essential hypertension - normal BP now -Continue comfort care  Acute metabolic encephalopathy-resolved as of 01/28/2023 - patient symptoms include AMS - etiology considered due to metabolic/septic for etiology     Old records reviewed in assessment of this patient  Antimicrobials: Rocephin 01/26/2023 >> 3/1  DVT prophylaxis:  SCDs Start: 01/26/23 0845   Code Status:   Code Status: DNR  Mobility Assessment (last 72 hours)     Mobility Assessment     Row Name 01/31/23 0830 01/29/23 2200         Does patient have an order for bedrest or is patient medically unstable Yes- Bedfast (Level 1) - Complete Yes- Bedfast (Level 1) - Complete      What is the highest level of mobility based on the progressive mobility assessment? Level 1 (Bedfast) - Unable to balance while sitting on edge of bed Level 1 (Bedfast) - Unable to balance while sitting on edge of bed      Is the above level different from baseline mobility prior to current illness? Yes - Recommend PT order --               Barriers to discharge:  Disposition Plan: Comfort care, residential hospice if able to be found  Status is: Inpatient  Objective: Blood pressure (!) 155/78, pulse 77, temperature 98.5 F (36.9 C), temperature source Oral, resp. rate 20, height '5\' 10"'$  (1.778 m), weight 83.7 kg, SpO2 95 %.  Examination:  Physical Exam Constitutional:      Comments: Chronically ill-appearing elderly gentleman lying in bed appearing fatigued. Speech still very dysarthric  HENT:     Head: Normocephalic and atraumatic.     Mouth/Throat:     Mouth: Mucous  membranes are moist.  Eyes:     Extraocular Movements: Extraocular movements intact.  Cardiovascular:     Rate and Rhythm: Normal rate and regular rhythm.  Pulmonary:     Effort: Pulmonary effort is normal. No respiratory distress.     Breath sounds: Normal breath sounds. No wheezing.  Abdominal:     General: Bowel sounds are normal. There is no distension.     Palpations: Abdomen is soft.     Tenderness: There is no abdominal tenderness.  Musculoskeletal:        General: No swelling.     Cervical back: Normal range of motion and neck supple.  Skin:    General: Skin is warm and dry.  Neurological:     Comments: Follows commands and moves all 4 extremities.  Dysarthric speech appreciated.  Globally weak      Consultants:  Palliative care  Procedures:    Data Reviewed: No results found for this or any previous visit (from the past 24 hour(s)).   I have reviewed pertinent nursing notes, vitals, labs, and images as necessary. I have ordered labwork to follow up on as indicated.  I have reviewed the  last notes from staff over past 24 hours. I have discussed patient's care plan and test results with nursing staff, CM/SW, and other staff as appropriate.    LOS: 6 days   Dwyane Dee, MD [facility] 02/01/2023, 1:23 PM

## 2023-02-01 NOTE — TOC Progression Note (Signed)
Transition of Care [facility]) - Progression Note    Patient Details  Name: Robert Fisher MRN: [id number] Date of Birth: 1934/03/28  Transition of Care [facility] [place]) CM/SW Contact  Ina Homes, [place] Phone Number: 02/01/2023, 1:34 PM  Clinical Narrative:     SW spoke with [facility] [place]) still full at this time.  SW informed by Lorayne Bender, MSW ([facility]) pt still does not meet inpatient criteria.   Pt does not have LTC coverage for SNF and family unable to take home. Referral was made 2/28 to financial to review for MCD eligibility.    Expected Discharge Plan: [place] Barriers to Discharge: Continued Medical Work up, Other (must enter comment) (pending residential hospice eligibility)  Expected Discharge Plan and Services     Post Acute Care Choice: Hospice Living arrangements for the past 2 months: Single Family Home                                       Social Determinants of Health (SDOH) Interventions SDOH Screenings   Food Insecurity: No Food Insecurity (08/28/2022)  Housing: Low Risk  (08/28/2022)  Transportation Needs: No Transportation Needs (08/28/2022)  Utilities: Not At Risk (08/28/2022)  Tobacco Use: Medium Risk (01/25/2023)    Readmission Risk Interventions     No data to display

## 2023-02-01 NOTE — Progress Notes (Signed)
[facility])   Referral received on 3.01 for [facility] to evaluate patient for BP. At this time, patient continues to not require any ongoing symptom management needs and, while minimal, continues to have ongoing intake.   At this time, patient does not meet criteria for BP. If family chooses, [facility] is able to follow in the home or LTC. [facility] IDT aware.   [facility] to follow peripherally while DC plans are finalized.     Please call with any questions/concerns.    Thank you for the opportunity to participate in this patient's care.   Phillis Haggis, MSW [facility] Liaison  [phone]

## 2023-02-01 NOTE — Progress Notes (Signed)
   Palliative Medicine Inpatient Follow Up Note HPI: 89 y.o. male  with past medical history of dementia, paranoid schizophrenia, seizure disorder, HTN, HLD, DMII, COPD, lung cancer, former tobacco use, history of DVT/PE on chronic Coumadin, chronic ambulatory dysfunction who initially presented to the hospital on 01/25/2023 after falling out of a chair. He was admitted on 01/25/2023 with septic shock, acute cystitis, hematuria, failure to thrive malnutrition, fall at home.    PMT was consulted for [place] conversations.  Today's Discussion 02/01/2023  *Please note that this is a verbal dictation therefore any spelling or grammatical errors are due to the "[place]" system interpretation.  Chart reviewed inclusive of vital signs, progress notes, laboratory results, and diagnostic images. PO's remain variable. Not ambulatory at this time.   I met at bedside with Gwyndolyn Saxon this morning. I spoke with both he and his NT, [place]. Laderrick is aware of himself though generally lethargic. He asks me when he will be going to his home. I shared that right now we are keeping him comfortable and finding a safe place for him to transition to.   I called Hillery's daughter, [name]. She shares that she was very hopeful that he was improving but realizes after speaking to the primary physician, Dr. Sabino Gasser that Maricela has declined tremendously over the last year and a half and that this will continue. She is in agreement with patient transition to hospice once accepted.  We reviewed that we should have better clarity of where Jakarion will transfer to tomorrow when the [facility] team returns.   Objective Assessment: Vital Signs Vitals:   02/01/23 0447 02/01/23 0756  BP: 137/72 (!) 155/78  Pulse: 81 77  Resp: 20 20  Temp: 99.6 F (37.6 C) 98.5 F (36.9 C)  SpO2: 96% 95%    Intake/Output Summary (Last 24 hours) at 02/01/2023 1356 Last data filed at 02/01/2023 0930 Gross per 24 hour  Intake 720 ml  Output 1900  ml  Net -1180 ml    Last Weight  Most recent update: 01/31/2023  4:25 AM    Weight  83.7 kg (184 lb 8.4 oz)            Gen:  Elderly Frail caucasian M in NAD HEENT: Dry mucous membranes CV: Regular rate and rhythm  PULM: On RA,. Breathing is even and nonlabored ABD: soft/nontender  EXT: No edema  Neuro: Somnolent though will arouse when stimulated, (+) dysarthria  SUMMARY OF RECOMMENDATIONS   DNAR/DNI  Continue Comfort Care    Comfort medications per MAR  H/O chronic LBP  - Added morphine QHS   TOC for IP Hospice referral  Ongoing PMT support  Billing based on MDM: High ______________________________________________________________________________________ [place] Team Team Cell Phone: [phone] Please utilize secure chat with additional questions, if there is no response within 30 minutes please call the above phone number  Palliative Medicine Team providers are available by phone from 7am to 7pm daily and can be reached through the team cell phone.  Should this patient require assistance outside of these hours, please call the patient's attending physician.

## 2023-02-02 DIAGNOSIS — A419 Sepsis, unspecified organism: Secondary | ICD-10-CM | POA: Diagnosis not present

## 2023-02-02 DIAGNOSIS — R627 Adult failure to thrive: Secondary | ICD-10-CM | POA: Diagnosis not present

## 2023-02-02 DIAGNOSIS — E440 Moderate protein-calorie malnutrition: Secondary | ICD-10-CM | POA: Diagnosis not present

## 2023-02-02 DIAGNOSIS — R319 Hematuria, unspecified: Secondary | ICD-10-CM | POA: Diagnosis not present

## 2023-02-02 LAB — GLUCOSE, CAPILLARY: Glucose-Capillary: 142 mg/dL — ABNORMAL HIGH (ref 70–99)

## 2023-02-02 MED ORDER — CLONAZEPAM 0.25 MG PO TBDP: 0.2500 mg | ORAL_TABLET | Freq: Two times a day (BID) | ORAL | 0 refills | Status: DC

## 2023-02-02 NOTE — Discharge Summary (Addendum)
Physician Discharge Summary   Robert Fisher [id number] DOB: 08-26-1934 DOA: 01/25/2023  PCP: Shon Baton, MD  Admit date: 01/25/2023 Discharge date: 02/02/2023  Admitted From: Home Disposition:  [facility] Discharging physician: Dwyane Dee, MD Barriers to discharge: awaiting hospice facility   Recommendations for Outpatient Follow-up:  Continue comfort care   Discharge Condition: poor CODE STATUS: DNR Diet recommendation:  Diet Orders (From admission, onward)     Start     Ordered   01/30/23 1843  Diet full liquid Room service appropriate? No; Fluid consistency: Thin  Diet effective now       Comments: Meds crushed in puree Total assist feeding Oral care after PO's  Question Answer Comment  Room service appropriate? No   Fluid consistency: Thin      01/30/23 1842            Hospital Course: Robert Fisher is an 89 yo male with PMH dementia, paranoid schizophrenia, seizure disorder, HTN, HLD, DMII, COPD, lung cancer, former tobacco use, history of DVT/PE on chronic Coumadin, chronic ambulatory dysfunction who initially presented to the hospital on 01/25/2023 after falling out of a chair.  He was initially evaluated by orthopedic surgery and cleared. Further workup revealed septic shock due to UTI requiring vasopressor use. He also had significant urinary retention requiring urology assistance for Foley catheter placement. Due to his overall generalized deconditioning and what appears to be progressive functional decline, palliative care was also consulted.  Assessment and Plan: * Septic shock (HCC)-resolved as of 01/28/2023 - Treated in the ICU after admission due to refractory hypotension - levophed has been weaned off - Urine culture grew Klebsiella, patient treated with Rocephin  Acute cystitis-resolved as of 02/02/2023 - See septic shock - s/p Rocephin course for Klebsiella noted in urine culture  History of pulmonary embolism - had been on  coumadin for hx PE - INR has normalized and no overt bleeding -Patient now transitioning to comfort care.  Okay to discontinue Coumadin  Adult failure to thrive - patient appears to have poor QOL at baseline and there's also concern for adequate care at home  - palliative care consulted for further [place] discussions  - patient did pass SLP eval on 2/28 and trial of FLD being started  -He continues to not show signs of improvement. Nutrition status will also become a large factor as time continues.  He does have a MOST form and also designates that he would not want a feeding tube. -Palliative care has spoken more with patient's daughter on 01/30/2023 and transition to comfort care has been decided upon - family unable to provide care for patient at home even with hospice in place; appears he's equivocal for BP to admit so best interest might be for patient to remain in hospital until he qualifies for residential vs passes in hospital (his intake seems not amenable to sustain life)  Hematuria-resolved as of 02/02/2023 - suspect traumatic foley attempts - continue foley chronically at this point in setting of severe deconditioning and comfort care  Supratherapeutic INR-resolved as of 02/01/2023 - INR 4.6 on admission, peaked at 7. Given Vit K - INR 1.4 on 2/28  Malnutrition of moderate degree - Patient's BMI is Body mass index is 26.19 kg/m.. - Patient has the following signs/symptoms consistent with PCM: (fat loss, muscle loss). - seen by RD, appreciate assistance. Continue plan per RD - FLD started 2/28; I do not believe TF would add to his overall QOL or potential for improvement; poor  prognosis; MOST form confirms patient to have no TF as well   Fall at home, initial encounter - patient very debilitated and likely warrants palliative discussions - see Failure to thrive  Schizophrenia, unspecified ([place]) - Wellbutrin and Zyprexa on hold to allow improvement in mentation  further  Generalized anxiety disorder - Continue clonazepam  Dyslipidemia - No further mortality benefit to continuing statin - Discontinue statin  Chronic pain disorder - Database reviewed.  No chronic opioids noted -Now in setting of comfort care, okay for morphine for any pain or discomfort  Chronic kidney disease, stage 3a ([place]) - patient has history of CKD3a. Baseline creat ~ 1.2, eGFR~ 50-55   Bipolar 2 disorder ([place]) - Patient had been very somnolent and sedated on admission.  Allowing further resolution of mentation -On hold currently: Wellbutrin, Zyprexa  Anxiety - clonazepam resumed  Seizure disorder (HCC) - d/c keppra - continue clonazepam at BID dosing; okay to continue in setting of comfort care with no weaning necessary  Essential hypertension - normal BP now -Continue comfort care  Acute metabolic encephalopathy-resolved as of 01/28/2023 - patient symptoms include AMS - etiology considered due to metabolic/septic for etiology     Principal Diagnosis: Septic shock [facility])  Discharge Diagnoses: Active Hospital Problems   Diagnosis Date Noted   History of pulmonary embolism 01/28/2023    Priority: 4.   Adult failure to thrive 02/05/2017    Priority: 4.   Malnutrition of moderate degree 01/28/2023   Pressure injury of skin 01/28/2023   Fall at home, initial encounter 01/26/2023   Anxiety 10/31/2022   Bipolar 2 disorder ([place]) 10/31/2022   Chronic pain disorder 10/31/2022   Dyslipidemia 10/31/2022   Generalized anxiety disorder 10/31/2022   Seizure disorder ([place]) 05/21/2022   Essential hypertension 04/02/2022   Malignant neoplasm of upper lobe, left bronchus or lung ([place]) 02/14/2019   Chronic kidney disease, stage 3a ([place]) 06/30/2012   Schizophrenia, unspecified ([place]) 10/24/2009    Resolved Hospital Problems   Diagnosis Date Noted Date Resolved   Septic shock ([place]) 01/28/2023 01/28/2023    Priority: 1.   Acute cystitis 01/26/2023 02/02/2023     Priority: 2.   Hematuria 01/26/2023 02/02/2023    Priority: 4.   Supratherapeutic INR  02/01/2023    Priority: 5.   Acute metabolic encephalopathy [id number] 01/28/2023     Discharge Instructions     Discharge wound care:   Complete by: As directed    Cleanse left lateral calf wound with saline, pat dry. Apply Medihoney to a dry 2x2 gauze and place in/over wound bed, top with silicone foam. Reapply Medihoney and gauze daily. Ok to lift foam and change foam every 3 days.      Allergies as of 02/02/2023       Reactions   Haloperidol Lactate Other (See Comments)   "Made him climb the walls."   Carbamazepine Other (See Comments)   Caused seizures    Sildenafil Other (See Comments)   "pain in right thigh."        Medication List     STOP taking these medications    aspirin EC 81 MG tablet   atorvastatin 40 MG tablet Commonly known as: LIPITOR   buPROPion 100 MG tablet Commonly known as: WELLBUTRIN   CALCIUM 500 + D PO   CERAVE MOISTURIZING EX   clonazePAM 0.5 MG tablet Commonly known as: KLONOPIN Replaced by: clonazePAM 0.25 MG disintegrating tablet   fenofibrate 160 MG tablet   Fish Oil Ultra 1400 MG  Caps   furosemide 20 MG tablet Commonly known as: LASIX   gabapentin 100 MG capsule Commonly known as: NEURONTIN   Gerhardt's butt cream Crea   levETIRAcetam 500 MG tablet Commonly known as: Keppra   linagliptin 5 MG Tabs tablet Commonly known as: TRADJENTA   liver oil-zinc oxide 40 % ointment Commonly known as: DESITIN   Lumify 0.025 % Soln Generic drug: Brimonidine Tartrate   metFORMIN 500 MG tablet Commonly known as: GLUCOPHAGE   metoprolol tartrate 25 MG tablet Commonly known as: LOPRESSOR   Multivitamin Gummies Mens Chew   OLANZapine 10 MG tablet Commonly known as: ZYPREXA   polyethylene glycol 17 g packet Commonly known as: MIRALAX / GLYCOLAX   potassium chloride SA 20 MEQ tablet Commonly known as: KLOR-CON M   PROBIOTIC PO    triamcinolone cream 0.1 % Commonly known as: KENALOG   Tylenol 8 Hour Arthritis Pain 650 MG CR tablet Generic drug: acetaminophen   warfarin 2 MG tablet Commonly known as: COUMADIN       TAKE these medications    clonazePAM 0.25 MG disintegrating tablet Commonly known as: KLONOPIN Take 1 tablet (0.25 mg total) by mouth 2 (two) times daily. Replaces: clonazePAM 0.5 MG tablet               Discharge Care Instructions  (From admission, onward)           Start     Ordered   02/02/23 0000  Discharge wound care:       Comments: Cleanse left lateral calf wound with saline, pat dry. Apply Medihoney to a dry 2x2 gauze and place in/over wound bed, top with silicone foam. Reapply Medihoney and gauze daily. Ok to lift foam and change foam every 3 days.   02/02/23 1526            Allergies  Allergen Reactions   Haloperidol Lactate Other (See Comments)    "Made him climb the walls."   Carbamazepine Other (See Comments)    Caused seizures    Sildenafil Other (See Comments)    "pain in right thigh."    Consultations: Palliative care  Procedures:   Discharge Exam: BP 125/67 (BP Location: Left Arm)   Pulse 83   Temp 98.4 F (36.9 C) (Oral)   Resp 15   Ht '5\' 10"'$  (1.778 m)   Wt 83.7 kg   SpO2 97%   BMI 26.48 kg/m  Physical Exam Constitutional:      Comments: Chronically ill-appearing elderly gentleman lying in bed appearing fatigued. Speech still very dysarthric  HENT:     Head: Normocephalic and atraumatic.     Mouth/Throat:     Mouth: Mucous membranes are moist.  Eyes:     Extraocular Movements: Extraocular movements intact.  Cardiovascular:     Rate and Rhythm: Normal rate and regular rhythm.  Pulmonary:     Effort: Pulmonary effort is normal. No respiratory distress.     Breath sounds: Normal breath sounds. No wheezing.  Abdominal:     General: Bowel sounds are normal. There is no distension.     Palpations: Abdomen is soft.     Tenderness:  There is no abdominal tenderness.  Musculoskeletal:        General: No swelling.     Cervical back: Normal range of motion and neck supple.  Skin:    General: Skin is warm and dry.  Neurological:     Comments: Follows commands and moves all 4 extremities.  Dysarthric speech  appreciated.  Globally weak      The results of significant diagnostics from this hospitalization (including imaging, microbiology, ancillary and laboratory) are listed below for reference.   Microbiology: Recent Results (from the past 240 hour(s))  Culture, blood (routine x 2)     Status: None   Collection Time: 01/25/23 10:38 AM   Specimen: BLOOD  Result Value Ref Range Status   Specimen Description BLOOD RIGHT ANTECUBITAL  Final   Special Requests   Final    BOTTLES DRAWN AEROBIC AND ANAEROBIC Blood Culture results may not be optimal due to an inadequate volume of blood received in culture bottles   Culture   Final    NO GROWTH 5 DAYS Performed at [facility], [place] [address] [place], [place] [address]    Report Status 01/30/2023 FINAL  Final  Culture, blood (routine x 2)     Status: None   Collection Time: 01/25/23 12:00 PM   Specimen: BLOOD RIGHT FOREARM  Result Value Ref Range Status   Specimen Description BLOOD RIGHT FOREARM  Final   Special Requests   Final    BOTTLES DRAWN AEROBIC AND ANAEROBIC Blood Culture adequate volume   Culture   Final    NO GROWTH 5 DAYS Performed at [facility], [place] [address], [place], [place] [address]    Report Status 01/30/2023 FINAL  Final  Urine Culture (for pregnant, neutropenic or urologic patients or patients with an indwelling urinary catheter)     Status: Abnormal   Collection Time: 01/25/23 11:27 PM   Specimen: Urine, Clean Catch  Result Value Ref Range Status   Specimen Description URINE, CLEAN CATCH  Final   Special Requests   Final    NONE Performed at [facility], [place] [address], [place], [place] [address]    Culture >=100,000  COLONIES/mL KLEBSIELLA PNEUMONIAE (A)  Final   Report Status 01/27/2023 FINAL  Final   Organism ID, Bacteria KLEBSIELLA PNEUMONIAE (A)  Final      Susceptibility   Klebsiella pneumoniae - MIC*    AMPICILLIN >=32 RESISTANT Resistant     CEFAZOLIN <=4 SENSITIVE Sensitive     CEFEPIME <=0.12 SENSITIVE Sensitive     CEFTRIAXONE <=0.25 SENSITIVE Sensitive     CIPROFLOXACIN <=0.25 SENSITIVE Sensitive     GENTAMICIN <=1 SENSITIVE Sensitive     IMIPENEM <=0.25 SENSITIVE Sensitive     NITROFURANTOIN 64 INTERMEDIATE Intermediate     TRIMETH/SULFA <=20 SENSITIVE Sensitive     AMPICILLIN/SULBACTAM 4 SENSITIVE Sensitive     PIP/TAZO <=4 SENSITIVE Sensitive     * >=100,000 COLONIES/mL KLEBSIELLA PNEUMONIAE  MRSA Next Gen by PCR, Nasal     Status: Abnormal   Collection Time: 01/26/23 11:04 AM   Specimen: Nasal Mucosa; Nasal Swab  Result Value Ref Range Status   MRSA by PCR Next Gen DETECTED (A) NOT DETECTED Final    Comment: RESULT CALLED TO, READ BACK BY AND VERIFIED WITH:  C/ C. RICE, RN 01/26/23 1300 A. [place] (NOTE) The GeneXpert MRSA Assay (FDA approved for NASAL specimens only), is one component of a comprehensive MRSA colonization surveillance program. It is not intended to diagnose MRSA infection nor to guide or monitor treatment for MRSA infections. Test performance is not FDA approved in patients less than 87 years old. Performed at [facility], [place] [address] [place], [place] [address]      Labs: BNP (last 3 results) Recent Labs    03/28/22 1906 05/19/22 1343  01/26/23 1124  BNP 201.0* 134.3* [id number]*   Basic Metabolic Panel: Recent Labs  Lab 01/26/23 2251 01/27/23 0132 01/27/23 2052 01/28/23 0049 01/28/23 0842 01/29/23 0319 01/30/23 0126  NA 142 142 141 143 142 142 143  K 3.3* 3.3* 3.3* 3.4* 3.6 3.7 3.5  CL 114* 113* 113* 115* 114* 114* 112*  CO2 19* 19* 18* 18* 17* 19* 22  GLUCOSE 132* 128* 94 95 103* 133* 115*  BUN '21 20 16 15 14 11 9  '$ CREATININE  1.38* 1.34* 1.22 1.20 1.21 0.95 0.89  CALCIUM 8.2* 8.2* 8.2* 8.2* 8.0* 8.2* 8.4*  MG 2.0 2.1 2.1  --   --  2.2 2.1   Liver Function Tests: No results for input(s): "AST", "ALT", "ALKPHOS", "BILITOT", "PROT", "ALBUMIN" in the last 168 hours. No results for input(s): "LIPASE", "AMYLASE" in the last 168 hours. No results for input(s): "AMMONIA" in the last 168 hours. CBC: Recent Labs  Lab 01/27/23 0132 01/28/23 0842 01/29/23 0319 01/30/23 0126  WBC 29.6* 17.0* 13.8* 9.1  NEUTROABS 27.2*  --  11.2* 6.3  HGB 11.0* 11.3* 12.5* 12.9*  HCT 35.6* 35.5* 39.4 41.4  MCV 91.0 90.1 89.3 90.0  PLT 233 203 230 244   Cardiac Enzymes: Recent Labs  Lab 01/27/23 0132  CKTOTAL 260   BNP: Invalid input(s): "POCBNP" CBG: Recent Labs  Lab 01/30/23 0450 01/30/23 0828 01/30/23 1207 01/30/23 2022 02/02/23 0740  GLUCAP 110* 115* 139* 143* 142*   D-Dimer No results for input(s): "DDIMER" in the last 72 hours. Hgb A1c No results for input(s): "HGBA1C" in the last 72 hours. Lipid Profile No results for input(s): "CHOL", "HDL", "LDLCALC", "TRIG", "CHOLHDL", "LDLDIRECT" in the last 72 hours. Thyroid function studies No results for input(s): "TSH", "T4TOTAL", "T3FREE", "THYROIDAB" in the last 72 hours.  Invalid input(s): "FREET3" Anemia work up No results for input(s): "VITAMINB12", "FOLATE", "FERRITIN", "TIBC", "IRON", "RETICCTPCT" in the last 72 hours. Urinalysis    Component Value Date/Time   COLORURINE YELLOW 01/25/2023 2322   APPEARANCEUR HAZY (A) 01/25/2023 2322   LABSPEC 1.019 01/25/2023 2322   LABSPEC 1.005 11/30/2008 1044   PHURINE 5.0 01/25/2023 2322   GLUCOSEU NEGATIVE 01/25/2023 2322   HGBUR LARGE (A) 01/25/2023 2322   BILIRUBINUR NEGATIVE 01/25/2023 2322   BILIRUBINUR Negative 11/30/2008 1044   KETONESUR 5 (A) 01/25/2023 2322   PROTEINUR NEGATIVE 01/25/2023 2322   NITRITE POSITIVE (A) 01/25/2023 2322   LEUKOCYTESUR MODERATE (A) 01/25/2023 2322   LEUKOCYTESUR Negative  11/30/2008 1044   Sepsis Labs Recent Labs  Lab 01/27/23 0132 01/28/23 0842 01/29/23 0319 01/30/23 0126  WBC 29.6* 17.0* 13.8* 9.1   Microbiology Recent Results (from the past 240 hour(s))  Culture, blood (routine x 2)     Status: None   Collection Time: 01/25/23 10:38 AM   Specimen: BLOOD  Result Value Ref Range Status   Specimen Description BLOOD RIGHT ANTECUBITAL  Final   Special Requests   Final    BOTTLES DRAWN AEROBIC AND ANAEROBIC Blood Culture results may not be optimal due to an inadequate volume of blood received in culture bottles   Culture   Final    NO GROWTH 5 DAYS Performed at [facility], [place] [address] [place], [place] [address]    Report Status 01/30/2023 FINAL  Final  Culture, blood (routine x 2)     Status: None   Collection Time: 01/25/23 12:00 PM   Specimen: BLOOD RIGHT FOREARM  Result Value Ref Range Status   Specimen Description BLOOD  RIGHT FOREARM  Final   Special Requests   Final    BOTTLES DRAWN AEROBIC AND ANAEROBIC Blood Culture adequate volume   Culture   Final    NO GROWTH 5 DAYS Performed at [facility], [address], [place], [place] [address]    Report Status 01/30/2023 FINAL  Final  Urine Culture (for pregnant, neutropenic or urologic patients or patients with an indwelling urinary catheter)     Status: Abnormal   Collection Time: 01/25/23 11:27 PM   Specimen: Urine, Clean Catch  Result Value Ref Range Status   Specimen Description URINE, CLEAN CATCH  Final   Special Requests   Final    NONE Performed at [facility], [place] [address] [place], [place] [address]    Culture >=100,000 COLONIES/mL KLEBSIELLA PNEUMONIAE (A)  Final   Report Status 01/27/2023 FINAL  Final   Organism ID, Bacteria KLEBSIELLA PNEUMONIAE (A)  Final      Susceptibility   Klebsiella pneumoniae - MIC*    AMPICILLIN >=32 RESISTANT Resistant     CEFAZOLIN <=4 SENSITIVE Sensitive     CEFEPIME <=0.12 SENSITIVE Sensitive     CEFTRIAXONE <=0.25  SENSITIVE Sensitive     CIPROFLOXACIN <=0.25 SENSITIVE Sensitive     GENTAMICIN <=1 SENSITIVE Sensitive     IMIPENEM <=0.25 SENSITIVE Sensitive     NITROFURANTOIN 64 INTERMEDIATE Intermediate     TRIMETH/SULFA <=20 SENSITIVE Sensitive     AMPICILLIN/SULBACTAM 4 SENSITIVE Sensitive     PIP/TAZO <=4 SENSITIVE Sensitive     * >=100,000 COLONIES/mL KLEBSIELLA PNEUMONIAE  MRSA Next Gen by PCR, Nasal     Status: Abnormal   Collection Time: 01/26/23 11:04 AM   Specimen: Nasal Mucosa; Nasal Swab  Result Value Ref Range Status   MRSA by PCR Next Gen DETECTED (A) NOT DETECTED Final    Comment: RESULT CALLED TO, READ BACK BY AND VERIFIED WITH:  C/ C. RICE, RN 01/26/23 1300 A. [place] (NOTE) The GeneXpert MRSA Assay (FDA approved for NASAL specimens only), is one component of a comprehensive MRSA colonization surveillance program. It is not intended to diagnose MRSA infection nor to guide or monitor treatment for MRSA infections. Test performance is not FDA approved in patients less than 69 years old. Performed at [facility], [place] [address] [place], [place] [address]     Procedures/Studies: ECHOCARDIOGRAM COMPLETE  Result Date: 01/26/2023    ECHOCARDIOGRAM REPORT   Patient Name:   TRAYVOND NEUJAHR Date of Exam: 01/26/2023 Medical Rec #:  [id number]         Height:       70.0 in Accession #:    [id number]        Weight:       190.0 lb Date of Birth:  [birth year]        BSA:          2.042 m Patient Age:    55 years          BP:           87/65 mmHg Patient Gender: M                 HR:           94 bpm. Exam Location:  Inpatient Procedure: 2D Echo, Cardiac Doppler and Color Doppler Indications:    Fever R50.9  History:        Patient has prior history of Echocardiogram examinations, most  recent 04/17/2022. Risk Factors:Hypertension, Dyslipidemia and                 Current Smoker.  Sonographer:    Greer Pickerel Referring Phys: Magdalen Spatz  Sonographer Comments: Image  acquisition challenging due to patient body habitus and Image acquisition challenging due to respiratory motion. IMPRESSIONS  1. Left ventricular ejection fraction, by estimation, is 60 to 65%. The left ventricle has normal function. Left ventricular endocardial border not optimally defined to evaluate regional wall motion due to off axis images. Left ventricular diastolic parameters are consistent with Grade II diastolic dysfunction (pseudonormalization). Elevated left ventricular end-diastolic pressure.  2. Right ventricular systolic function is normal. The right ventricular size is normal. Tricuspid regurgitation signal is inadequate for assessing PA pressure.  3. Left atrial size was moderately dilated.  4. The mitral valve is degenerative. Trivial mitral valve regurgitation. No evidence of mitral stenosis.  5. The aortic valve is tricuspid. There is moderate calcification of the aortic valve. There is moderate thickening of the aortic valve. Aortic valve regurgitation is not visualized. Aortic valve sclerosis/calcification is present, without any evidence of aortic stenosis. FINDINGS  Left Ventricle: Left ventricular ejection fraction, by estimation, is 60 to 65%. The left ventricle has normal function. Left ventricular endocardial border not optimally defined to evaluate regional wall motion. The left ventricular internal cavity size was normal in size. There is no left ventricular hypertrophy. Left ventricular diastolic parameters are consistent with Grade II diastolic dysfunction (pseudonormalization). Elevated left ventricular end-diastolic pressure. Right Ventricle: The right ventricular size is normal. No increase in right ventricular wall thickness. Right ventricular systolic function is normal. Tricuspid regurgitation signal is inadequate for assessing PA pressure. Left Atrium: Left atrial size was moderately dilated. Right Atrium: Right atrial size was normal in size. Pericardium: There is no evidence  of pericardial effusion. Mitral Valve: The mitral valve is degenerative in appearance. There is mild calcification of the mitral valve leaflet(s). Mild to moderate mitral annular calcification. Trivial mitral valve regurgitation. No evidence of mitral valve stenosis. Tricuspid Valve: The tricuspid valve is normal in structure. Tricuspid valve regurgitation is not demonstrated. No evidence of tricuspid stenosis. Aortic Valve: The aortic valve is tricuspid. There is moderate calcification of the aortic valve. There is moderate thickening of the aortic valve. Aortic valve regurgitation is not visualized. Aortic valve sclerosis/calcification is present, without any  evidence of aortic stenosis. Pulmonic Valve: The pulmonic valve was normal in structure. Pulmonic valve regurgitation is trivial. No evidence of pulmonic stenosis. Aorta: The aortic root is normal in size and structure. Venous: The inferior vena cava was not well visualized. IAS/Shunts: No atrial level shunt detected by color flow Doppler.  LEFT VENTRICLE PLAX 2D LVIDd:         4.00 cm   Diastology LVIDs:         2.60 cm   LV e' medial:    4.46 cm/s LV PW:         1.30 cm   LV E/e' medial:  26.5 LV IVS:        1.10 cm   LV e' lateral:   5.22 cm/s LVOT diam:     1.90 cm   LV E/e' lateral: 22.6 LV SV:         43 LV SV Index:   21 LVOT Area:     2.84 cm  RIGHT VENTRICLE RV S prime:     13.40 cm/s TAPSE (M-mode): 2.3 cm LEFT ATRIUM  Index        RIGHT ATRIUM           Index LA diam:      4.30 cm 2.11 cm/m   RA Area:     12.10 cm LA Vol (A2C): 53.1 ml 26.00 ml/m  RA Volume:   24.10 ml  11.80 ml/m LA Vol (A4C): 91.4 ml 44.75 ml/m  AORTIC VALVE LVOT Vmax:   102.00 cm/s LVOT Vmean:  66.100 cm/s LVOT VTI:    0.151 m  AORTA Ao Root diam: 3.50 cm Ao Asc diam:  3.30 cm MITRAL VALVE MV Area (PHT): 4.41 cm     SHUNTS MV Decel Time: 172 msec     Systemic VTI:  0.15 m MV E velocity: 118.00 cm/s  Systemic Diam: 1.90 cm MV A velocity: 132.00 cm/s MV E/A ratio:   0.89 Fransico Him MD Electronically signed by Fransico Him MD Signature Date/Time: 01/26/2023/2:31:51 PM    Final    DG CHEST PORT 1 VIEW  Result Date: 01/26/2023 CLINICAL DATA:  Fever. EXAM: PORTABLE CHEST 1 VIEW COMPARISON:  Chest radiograph 01/25/2023 FINDINGS: The cardiomediastinal silhouette is unchanged. A masslike opacity in the left upper lobe with adjacent fiducial markers is similar to the prior study in this patient with a history of treated lung cancer. There is mild prominence of the interstitial markings without overt pulmonary edema. There is chronic blunting of the left lateral costophrenic angle without evidence of a sizable pleural effusion. No definite acute airspace consolidation or pneumothorax is identified. No acute osseous abnormality is seen. IMPRESSION: Chronic changes without evidence of acute cardiopulmonary process. Electronically Signed   By: Logan Bores M.D.   On: 01/26/2023 10:07   CT TIBIA FIBULA LEFT WO CONTRAST  Result Date: 01/26/2023 CLINICAL DATA:  Tunneling pressure ulcer. History of DVT, hypertension and diabetes. EXAM: CT OF THE LOWER LEFT EXTREMITY WITHOUT CONTRAST TECHNIQUE: Multidetector CT imaging of the left lower leg was performed according to the standard protocol. RADIATION DOSE REDUCTION: This exam was performed according to the departmental dose-optimization program which includes automated exposure control, adjustment of the mA and/or kV according to patient size and/or use of iterative reconstruction technique. COMPARISON:  Radiographs 01/25/2023 FINDINGS: Bones/Joint/Cartilage No evidence of acute fracture, dislocation or osteomyelitis. There are advanced degenerative changes at left knee with joint space narrowing, osteophytes and multiple intra-articular loose bodies. There is a moderate-sized left knee joint effusion. No significant arthropathy at the left ankle. Ligaments Suboptimally assessed by CT. Muscles and Tendons Mild muscular atrophy in the  posterior compartment. The ankle tendons appear intact, although there are distal insertions are incompletely visualized. The visualized extensor mechanism appears intact at the knee. No intramuscular fluid collections or abnormal enhancement identified. Soft tissues Soft tissue ulceration noted posterolaterally in the distal lower leg. There is generalized subcutaneous edema in the mid to lower leg without evidence of focal fluid collection, abnormal enhancement, foreign body or soft tissue emphysema. Diffuse vascular calcifications are present. IMPRESSION: 1. Soft tissue ulceration posterolaterally in the distal lower leg. Nonspecific generalized subcutaneous edema in the mid to lower leg which may reflect superficial soft tissue infection (cellulitis). No evidence of focal fluid collection, foreign body or soft tissue emphysema. 2. No evidence of osteomyelitis or acute osseous findings. 3. Advanced degenerative changes at the left knee with multiple intra-articular loose bodies and a moderate-sized joint effusion. 4. Diffuse vascular calcifications. Electronically Signed   By: Richardean Sale M.D.   On: 01/26/2023 08:16   CT CERVICAL SPINE  WO CONTRAST  Result Date: 01/25/2023 CLINICAL DATA:  Trauma EXAM: CT HEAD WITHOUT CONTRAST CT CERVICAL SPINE WITHOUT CONTRAST TECHNIQUE: Multidetector CT imaging of the head and cervical spine was performed following the standard protocol without intravenous contrast. Multiplanar CT image reconstructions of the cervical spine were also generated. RADIATION DOSE REDUCTION: This exam was performed according to the departmental dose-optimization program which includes automated exposure control, adjustment of the mA and/or kV according to patient size and/or use of iterative reconstruction technique. COMPARISON:  CT C Spine and Head 04/03/22 FINDINGS: CT HEAD FINDINGS Brain: No evidence of acute infarction, hemorrhage, hydrocephalus, extra-axial collection or mass lesion/mass  effect. Sequela of moderate to severe chronic microvascular ischemic change. Generalized volume loss. Vascular: No disproportionately hyperdense vessel or unexpected calcification. Skull: Normal. Negative for fracture or focal lesion. Radiodensities along the frontal scalp are favored to represent dermal calcifications. Mild soft tissue swelling along the left parietal scalp. Sinuses/Orbits: Bilateral lens replacement. No middle ear or mastoid effusion. There are frothy secretions in the right sphenoid sinus. Other: None. CT CERVICAL SPINE FINDINGS Alignment: There is trace anterolisthesis of C3 on C4. Trace retrolisthesis of C6 onC7. Skull base and vertebrae: No acute fracture. No primary bone lesion or focal pathologic process. There are multilevel degenerative endplate changes with osseous fusion of C5-C6 and severe disc space loss at C6-C7. Soft tissues and spinal canal: No prevertebral fluid or swelling. No visible canal hematoma. Disc levels:  No evidence of high-grade spinal canal stenosis. Upper chest: There fissural fluid on the left.  No pneumothorax. Other: None IMPRESSION: 1. No CT evidence of intracranial injury. 2. Mild soft tissue swelling along the left parietal scalp. No evidence of underlying calvarial fracture. 3. No acute cervical spine fracture. Electronically Signed   By: Marin Roberts M.D.   On: 01/25/2023 12:27   CT HEAD WO CONTRAST  Result Date: 01/25/2023 CLINICAL DATA:  Trauma EXAM: CT HEAD WITHOUT CONTRAST CT CERVICAL SPINE WITHOUT CONTRAST TECHNIQUE: Multidetector CT imaging of the head and cervical spine was performed following the standard protocol without intravenous contrast. Multiplanar CT image reconstructions of the cervical spine were also generated. RADIATION DOSE REDUCTION: This exam was performed according to the departmental dose-optimization program which includes automated exposure control, adjustment of the mA and/or kV according to patient size and/or use of iterative  reconstruction technique. COMPARISON:  CT C Spine and Head 04/03/22 FINDINGS: CT HEAD FINDINGS Brain: No evidence of acute infarction, hemorrhage, hydrocephalus, extra-axial collection or mass lesion/mass effect. Sequela of moderate to severe chronic microvascular ischemic change. Generalized volume loss. Vascular: No disproportionately hyperdense vessel or unexpected calcification. Skull: Normal. Negative for fracture or focal lesion. Radiodensities along the frontal scalp are favored to represent dermal calcifications. Mild soft tissue swelling along the left parietal scalp. Sinuses/Orbits: Bilateral lens replacement. No middle ear or mastoid effusion. There are frothy secretions in the right sphenoid sinus. Other: None. CT CERVICAL SPINE FINDINGS Alignment: There is trace anterolisthesis of C3 on C4. Trace retrolisthesis of C6 onC7. Skull base and vertebrae: No acute fracture. No primary bone lesion or focal pathologic process. There are multilevel degenerative endplate changes with osseous fusion of C5-C6 and severe disc space loss at C6-C7. Soft tissues and spinal canal: No prevertebral fluid or swelling. No visible canal hematoma. Disc levels:  No evidence of high-grade spinal canal stenosis. Upper chest: There fissural fluid on the left.  No pneumothorax. Other: None IMPRESSION: 1. No CT evidence of intracranial injury. 2. Mild soft tissue swelling along the left  parietal scalp. No evidence of underlying calvarial fracture. 3. No acute cervical spine fracture. Electronically Signed   By: Marin Roberts M.D.   On: 01/25/2023 12:27   DG Tibia/Fibula Left Port  Result Date: 01/25/2023 CLINICAL DATA:  Trauma, sore on posterior mid LEFT tib-fib. EXAM: PORTABLE LEFT TIBIA AND FIBULA - 2 VIEW COMPARISON:  None Available. FINDINGS: No acute findings. No evidence of fracture line or acutely displaced fracture fragment. No acute-appearing cortical irregularity or osseous lesion. Degenerative osteoarthritic changes at  the LEFT knee, at least moderate in degree with associated osteophyte formation. Soft tissue lucency overlying the distal LEFT tib-fib, presumably corresponding to the sore described in the clinical data. No evidence of soft tissue gas elsewhere in the LEFT calf. IMPRESSION: 1. Soft tissue lucency overlying the distal LEFT tib-fib, presumably corresponding to the sore described in the clinical data. No evidence of underlying osteomyelitis or fracture. 2. No acute osseous abnormality. Electronically Signed   By: Franki Cabot M.D.   On: 01/25/2023 11:09   DG Pelvis Portable  Result Date: 01/25/2023 CLINICAL DATA:  89 year old male status post fall. EXAM: PORTABLE PELVIS 1-2 VIEWS COMPARISON:  CT Abdomen and Pelvis 08/26/2022. FINDINGS: Portable AP supine view at 1040 hours. Chronic right inguinal surgical clips. Chronic bilateral acetabulum spurring. Bone mineralization is normal for age. Femoral heads remain normally located. Pelvis appears stable and intact. Grossly intact proximal femurs. Negative visible bowel gas. Chronic lumbosacral junction disc and endplate degeneration. IMPRESSION: No acute fracture or dislocation identified about the pelvis. Electronically Signed   By: Genevie Ann M.D.   On: 01/25/2023 11:08   DG Chest Port 1 View  Result Date: 01/25/2023 CLINICAL DATA:  89 year old male with history of trauma. EXAM: PORTABLE CHEST 1 VIEW COMPARISON:  Chest x-ray 08/26/2022.  Chest CT 11/19/2022. FINDINGS: Chronic mass-like area of architectural distortion in the left upper lobe, with fiducial markers in this region, similar to the prior radiograph corresponding to areas of postradiation fibrosis on prior chest CT. No acute consolidative airspace disease. Blunting of the left costophrenic sulcus, similar to prior studies, attributable to a prominent fat pad in the lower left hemithorax on prior chest CT. Right lung is clear. No right pleural effusion. No pneumothorax. No evidence of pulmonary edema.  Heart size is normal. The patient is rotated to the left on today's exam, resulting in distortion of the mediastinal contours and reduced diagnostic sensitivity and specificity for mediastinal pathology. Atherosclerotic calcifications in the thoracic aorta. IMPRESSION: 1. Stable radiographic appearance of the chest, as above, without findings to suggest acute cardiopulmonary disease. 2. Aortic atherosclerosis. Electronically Signed   By: Vinnie Langton M.D.   On: 01/25/2023 11:05     Time coordinating discharge: Over 30 minutes    Dwyane Dee, MD  [facility] 02/02/2023, 3:26 PM

## 2023-02-02 NOTE — Progress Notes (Signed)
Symptom management check.  Patient is comfort care.  In bed asleep, comfortably, no signs of pain or distress. Awakens with light touch.  NT reports patient ate his breakfast this morning with assistance.  Lunch tray has been ordered.  Right hand slightly swollen, daughter [name] voiced concern.  Mild edema noted in hand.  Will have nursing d/c the IV cath in the right forearm for comfort.  Spoke with daughter [name], she is struggling with the decision for comfort only.  Offered support, answered questions.  Assured her that at this time Mr. Steger is comfortable.  Suggested she deal with one day at a time, focus on his comfort each day and not worry about "if" or "when".  She verbalized appreciation for the support.  Told her to call PMT phone with any questions.  Will continue symptom checks.  Please call with any palliative needs.  Kizzie Fantasia, MSN, RN-BC, [facility], [facility]-C Palliative Clinical Specialist [facility]

## 2023-02-02 NOTE — TOC Transition Note (Signed)
Transition of Care [facility]) - CM/SW Discharge Note   Patient Details  Name: Robert Fisher MRN: [id number] Date of Birth: Apr 19, 1934  [facility]) CM/SW Contact:  [name], LCSW Phone Number: 02/02/2023, 4:01 PM   Clinical Narrative:   Pt discharging to [facility].  RN call report to [phone].    Final next level of care: [place] Barriers to Discharge: Barriers Resolved   Patient Goals and CMS Choice   Choice offered to / list presented to : Adult Children (daughter [name])  Discharge Placement                Patient chooses bed at:  [facility] [place]) Patient to be transferred to facility by: [place] Name of family member notified: daughter [name] Patient and family notified of of transfer: 02/02/23  Discharge Plan and Services Additional resources added to the After Visit Summary for       Post Acute Care Choice: Hospice                               Social Determinants of Health (SDOH) Interventions SDOH Screenings   Food Insecurity: No Food Insecurity (08/28/2022)  Housing: Low Risk  (08/28/2022)  Transportation Needs: No Transportation Needs (08/28/2022)  Utilities: Not At Risk (08/28/2022)  Tobacco Use: Medium Risk (01/25/2023)     Readmission Risk Interventions     No data to display

## 2023-02-02 NOTE — Progress Notes (Signed)
[facility]--Pt has been accepted to transfer to [facility] at [facility] today [phone] [place], [place] [address]).  Dtr [name] has signed requisite consent forms via [place].  She and pt's wife Caryl Asp are in agreement with pt's transfer today.  Report can be called to [phone].  Updated SW, Lurline Idol who will set up non-emergent ambulance transport with signed DNR.  Thank you for the opportunity to serve this pt and family.  Quenton Fetter, RN [facility] [phone] office

## 2023-02-02 NOTE — TOC Progression Note (Addendum)
Transition of Care [facility]) - Progression Note    Patient Details  Name: Robert Fisher MRN: [id number] Date of Birth: [birth year]  [facility]) CM/SW Contact  [name], LCSW Phone Number: 02/02/2023, 9:44 AM  Clinical Narrative:   TC [facility].  Possibly a bed available today.  She will confirm and call back.  1400: TC Joanne, [facility].  They do have bed today.  CSW spoke with daughter [name], who asked about possible bed at [facility], discussed that [facility] place is not able to offer bed.  [name] does want to accept [facility] bed and will call Mechele Claude at [facility].  1500: Confirmed with JOanne/[facility] that she heard from [place] and is all set.  MD informed.  Expected Discharge Plan: [place] Barriers to Discharge: Continued Medical Work up, Other (must enter comment) (pending residential hospice eligibility)  Expected Discharge Plan and Services     Post Acute Care Choice: Hospice Living arrangements for the past 2 months: Single Family Home                                       Social Determinants of Health (SDOH) Interventions SDOH Screenings   Food Insecurity: No Food Insecurity (08/28/2022)  Housing: Low Risk  (08/28/2022)  Transportation Needs: No Transportation Needs (08/28/2022)  Utilities: Not At Risk (08/28/2022)  Tobacco Use: Medium Risk (01/25/2023)    Readmission Risk Interventions     No data to display

## 2023-02-02 NOTE — Progress Notes (Signed)
Ok to DC to hospice with IV in place per Dr. Sabino Gasser.

## 2023-02-02 NOTE — Progress Notes (Signed)
Progress Note    OWIN HURNEY   [id number]  DOB: [birth year]  DOA: 01/25/2023     7 PCP: Shon Baton, MD  Initial CC: fall  Hospital Course: [name] is an 89 yo male with PMH dementia, paranoid schizophrenia, seizure disorder, HTN, HLD, DMII, COPD, lung cancer, former tobacco use, history of DVT/PE on chronic Coumadin, chronic ambulatory dysfunction who initially presented to the hospital on 01/25/2023 after falling out of a chair.  He was initially evaluated by orthopedic surgery and cleared. Further workup revealed septic shock due to UTI requiring vasopressor use. He also had significant urinary retention requiring urology assistance for Foley catheter placement. Due to his overall generalized deconditioning and what appears to be progressive functional decline, palliative care was also consulted.  Interval History:  No events overnight.  Remains comfortable. Ate some b'fast this am.  Assessment and Plan: * Septic shock (HCC)-resolved as of 01/28/2023 - Treated in the ICU after admission due to refractory hypotension - levophed has been weaned off - Urine culture grew Klebsiella, patient treated with Rocephin  Acute cystitis-resolved as of 02/02/2023 - See septic shock - s/p Rocephin course for Klebsiella noted in urine culture  History of pulmonary embolism - had been on coumadin for hx PE - INR has normalized and no overt bleeding -Patient now transitioning to comfort care.  Okay to discontinue Coumadin  Adult failure to thrive - patient appears to have poor QOL at baseline and there's also concern for adequate care at home  - palliative care consulted for further [place] discussions  - patient did pass SLP eval on 2/28 and trial of FLD being started  -He continues to not show signs of improvement. Nutrition status will also become a large factor as time continues.  He does have a MOST form and also designates that he would not want a feeding tube. -Palliative care  has spoken more with patient's daughter on 01/30/2023 and transition to comfort care has been decided upon - family unable to provide care for patient at home even with hospice in place; appears he's equivocal for BP to admit so best interest might be for patient to remain in hospital until he qualifies for residential vs passes in hospital (his intake seems not amenable to sustain life)  Hematuria-resolved as of 02/02/2023 - suspect traumatic foley attempts - continue foley chronically at this point in setting of severe deconditioning and comfort care  Supratherapeutic INR-resolved as of 02/01/2023 - INR 4.6 on admission, peaked at 7. Given Vit K - INR 1.4 on 2/28  Malnutrition of moderate degree - Patient's BMI is Body mass index is 26.19 kg/m.. - Patient has the following signs/symptoms consistent with PCM: (fat loss, muscle loss). - seen by RD, appreciate assistance. Continue plan per RD - FLD started 2/28; I do not believe TF would add to his overall QOL or potential for improvement; poor prognosis; MOST form confirms patient to have no TF as well   Fall at home, initial encounter - patient very debilitated and likely warrants palliative discussions - see Failure to thrive  Schizophrenia, unspecified (HCC) - Wellbutrin and Zyprexa on hold to allow improvement in mentation further  Generalized anxiety disorder - Continue clonazepam  Dyslipidemia - No further mortality benefit to continuing statin - Discontinue statin  Chronic pain disorder - Database reviewed.  No chronic opioids noted -Now in setting of comfort care, okay for morphine for any pain or discomfort  Chronic kidney disease, stage 3a ([place]) - patient has  history of CKD3a. Baseline creat ~ 1.2, eGFR~ 50-55   Bipolar 2 disorder ([place]) - Patient had been very somnolent and sedated on admission.  Allowing further resolution of mentation -On hold currently: Wellbutrin, Zyprexa  Anxiety - clonazepam resumed  Seizure  disorder (HCC) - resume keppra - resume clonazepam at daily dosing; okay to continue in setting of comfort care with no weaning necessary  Essential hypertension - normal BP now -Continue comfort care  Acute metabolic encephalopathy-resolved as of 01/28/2023 - patient symptoms include AMS - etiology considered due to metabolic/septic for etiology     Old records reviewed in assessment of this patient  Antimicrobials: Rocephin 01/26/2023 >> 3/1  DVT prophylaxis:  SCDs Start: 01/26/23 0845   Code Status:   Code Status: DNR  Mobility Assessment (last 72 hours)     Mobility Assessment     Row Name 02/02/23 0920 02/01/23 0900 01/31/23 0830       Does patient have an order for bedrest or is patient medically unstable Yes- Bedfast (Level 1) - Complete Yes- Bedfast (Level 1) - Complete Yes- Bedfast (Level 1) - Complete     What is the highest level of mobility based on the progressive mobility assessment? Level 1 (Bedfast) - Unable to balance while sitting on edge of bed Level 1 (Bedfast) - Unable to balance while sitting on edge of bed Level 1 (Bedfast) - Unable to balance while sitting on edge of bed     Is the above level different from baseline mobility prior to current illness? -- Yes - Recommend PT order Yes - Recommend PT order              Barriers to discharge:  Disposition Plan: Comfort care, residential hospice if able to be found  Status is: Inpatient  Objective: Blood pressure (!) 153/69, pulse 77, temperature (!) 97.3 F (36.3 C), temperature source Oral, resp. rate 17, height '5\' 10"'$  (1.778 m), weight 83.7 kg, SpO2 97 %.  Examination:  Physical Exam Constitutional:      Comments: Chronically ill-appearing elderly gentleman lying in bed appearing fatigued. Speech still very dysarthric  HENT:     Head: Normocephalic and atraumatic.     Mouth/Throat:     Mouth: Mucous membranes are moist.  Eyes:     Extraocular Movements: Extraocular movements intact.   Cardiovascular:     Rate and Rhythm: Normal rate and regular rhythm.  Pulmonary:     Effort: Pulmonary effort is normal. No respiratory distress.     Breath sounds: Normal breath sounds. No wheezing.  Abdominal:     General: Bowel sounds are normal. There is no distension.     Palpations: Abdomen is soft.     Tenderness: There is no abdominal tenderness.  Musculoskeletal:        General: No swelling.     Cervical back: Normal range of motion and neck supple.  Skin:    General: Skin is warm and dry.  Neurological:     Comments: Follows commands and moves all 4 extremities.  Dysarthric speech appreciated.  Globally weak      Consultants:  Palliative care  Procedures:    Data Reviewed: Results for orders placed or performed during the hospital encounter of 01/25/23 (from the past 24 hour(s))  Glucose, capillary     Status: Abnormal   Collection Time: 02/02/23  7:40 AM  Result Value Ref Range   Glucose-Capillary 142 (H) 70 - 99 mg/dL     I have reviewed pertinent  nursing notes, vitals, labs, and images as necessary. I have ordered labwork to follow up on as indicated.  I have reviewed the last notes from staff over past 24 hours. I have discussed patient's care plan and test results with nursing staff, CM/SW, and other staff as appropriate.    LOS: 7 days   Dwyane Dee, MD [facility] 02/02/2023, 1:31 PM

## 2023-02-06 ENCOUNTER — Ambulatory Visit: Payer: PPO | Admitting: Podiatry

## 2023-03-02 DEATH — deceased

## 2023-04-13 ENCOUNTER — Ambulatory Visit: Payer: Self-pay | Admitting: Radiation Oncology

## 2023-06-17 ENCOUNTER — Other Ambulatory Visit: Payer: Self-pay

## 2023-06-19 IMAGING — CT CT CHEST W/ CM
2 of 4 series · 15 of 36 positions shown, 18 images · IV contrast (omnipaque)
Comparison: 01/10/2021

CLINICAL DATA: Non-small cell lung cancer, XRT complete

EXAM:
CT CHEST WITH CONTRAST
TECHNIQUE: Multidetector CT imaging of the chest was performed during
intravenous contrast administration.
CONTRAST:  60mL OMNIPAQUE IOHEXOL 350 MG/ML SOLN

[Series 2: axial st · axial · 0.80mm/px · z∈[-253,+47]mm · 12 of 178 slices shown, 15 images]
[im 14/178  mediastinal]
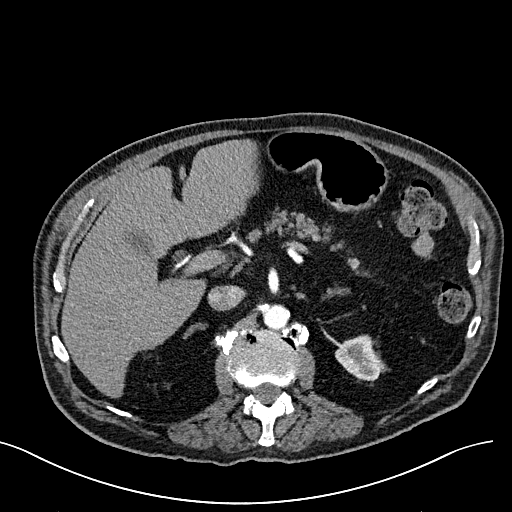
[im 14/178  lung]
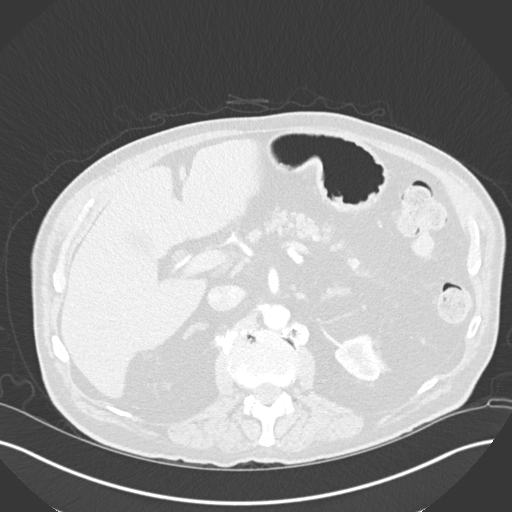
[im 28/178  lung]
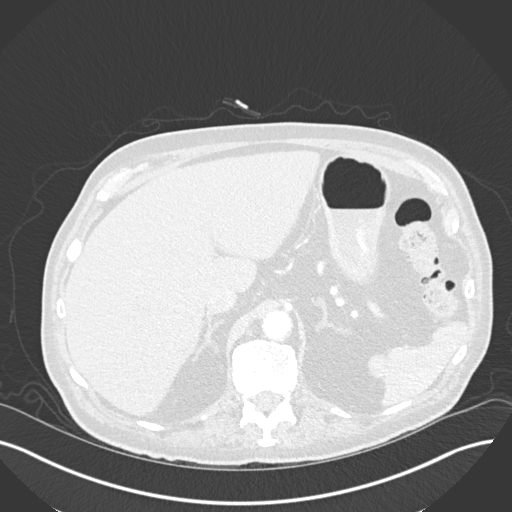
[im 41/178  lung]
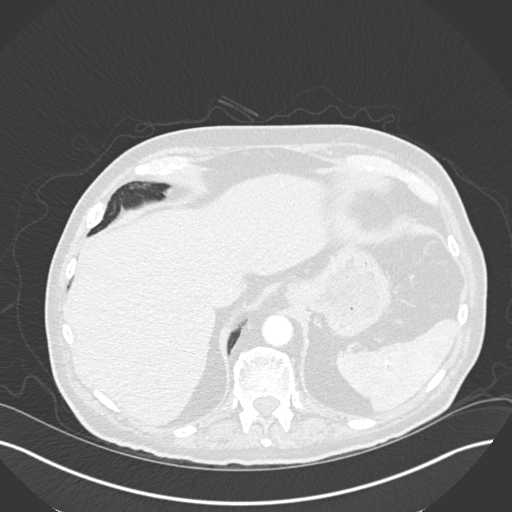
[im 55/178  lung]
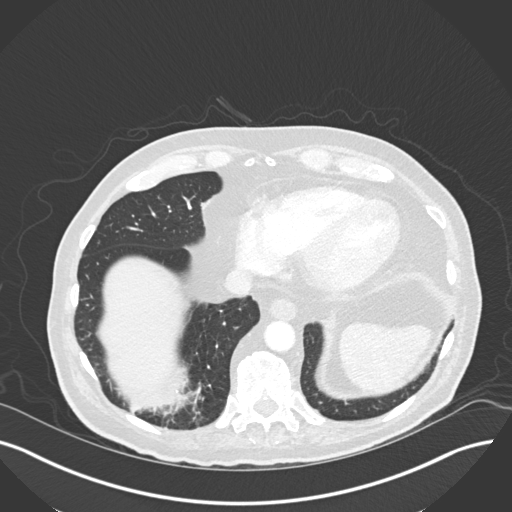
[im 69/178  mediastinal]
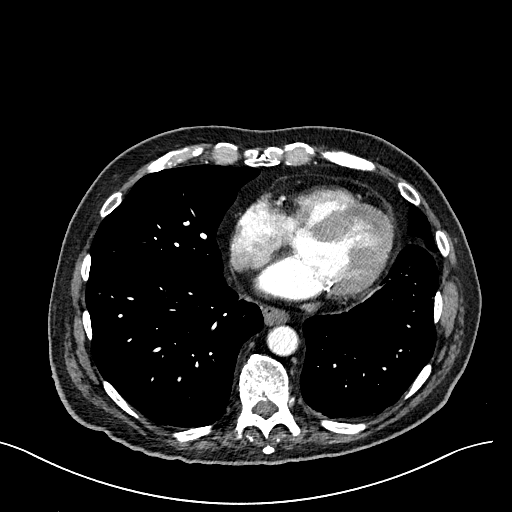
[im 69/178  lung]
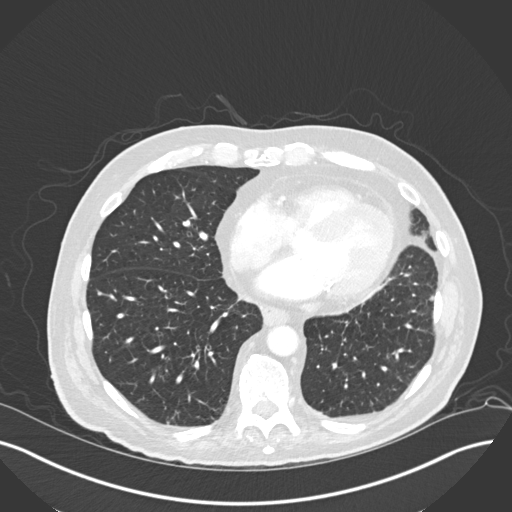
[im 82/178  lung]
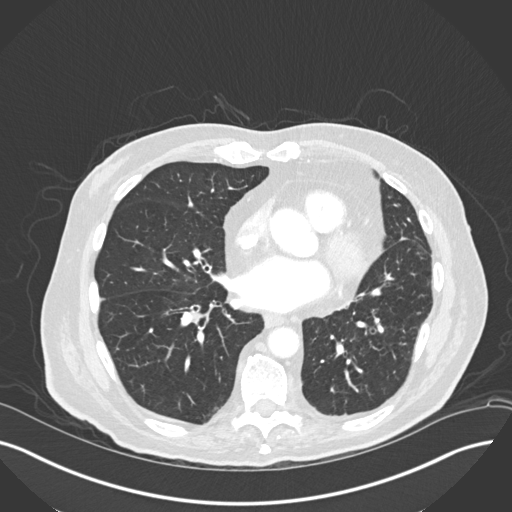
[im 96/178  lung]
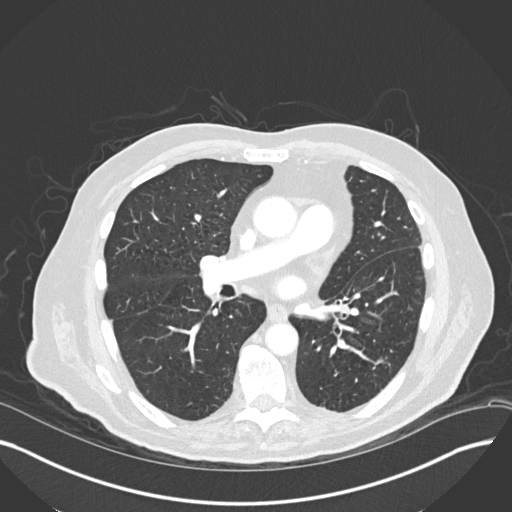
[im 109/178  lung]
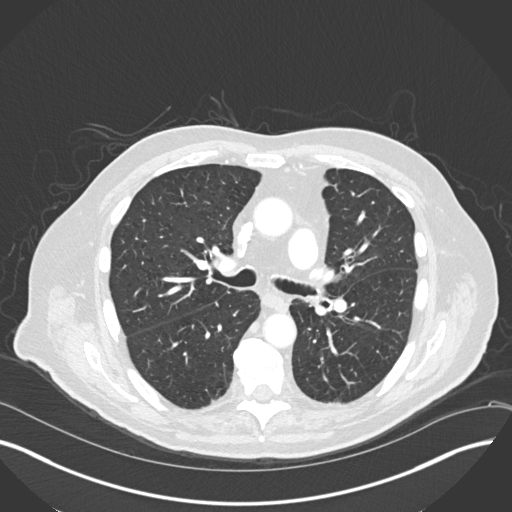
[im 123/178  mediastinal]
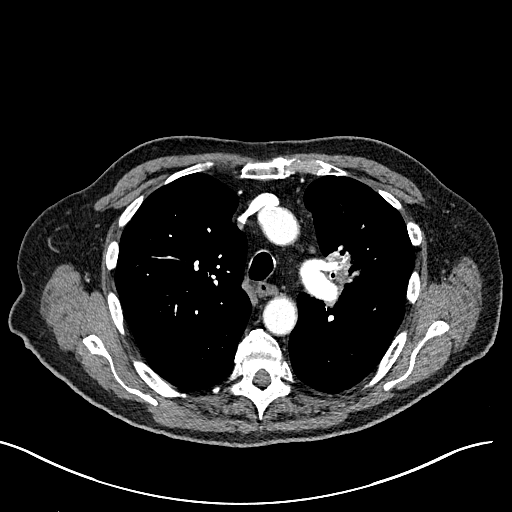
[im 123/178  lung]
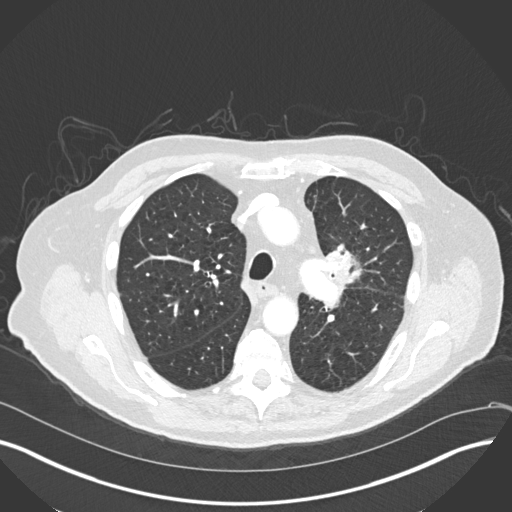
[im 137/178  lung]
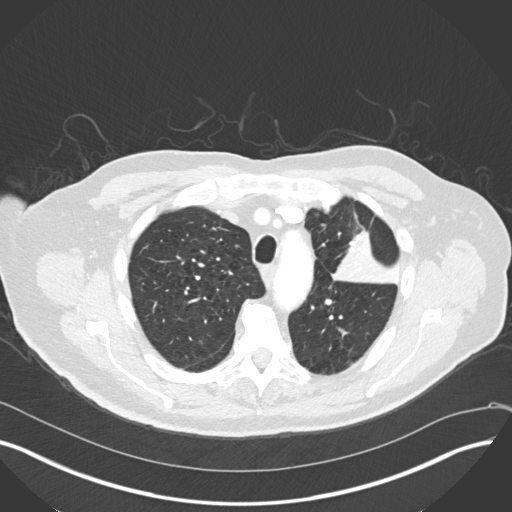
[im 150/178  lung]
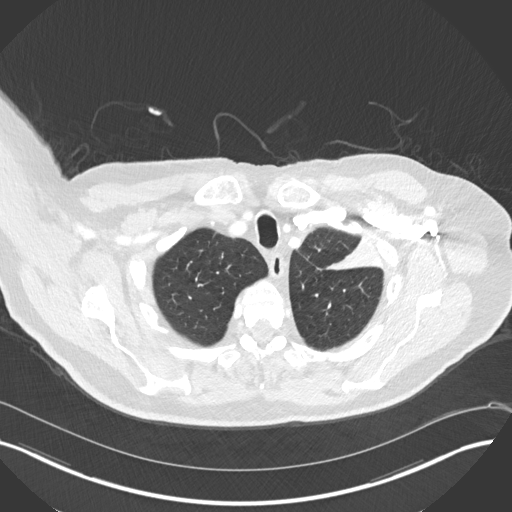
[im 164/178  lung]
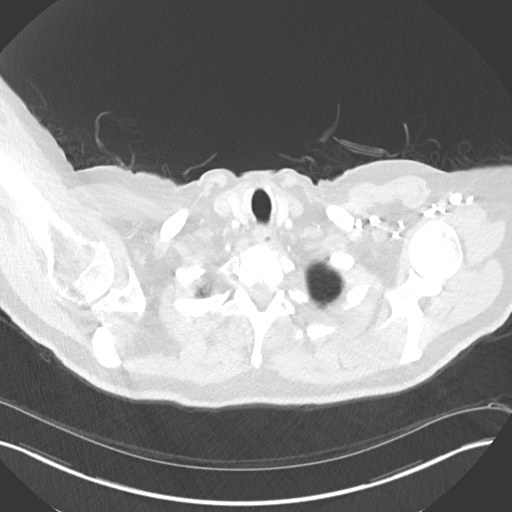

[Series 6: coronal · coronal · 0.70mm/px · 3 of 134 slices shown]
[im 27/134  lung]
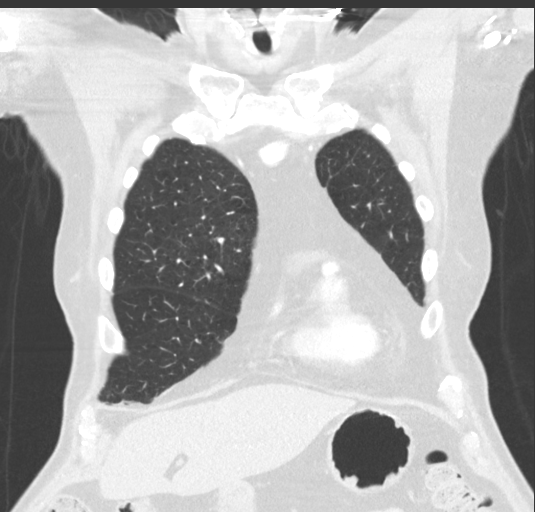
[im 54/134  lung]
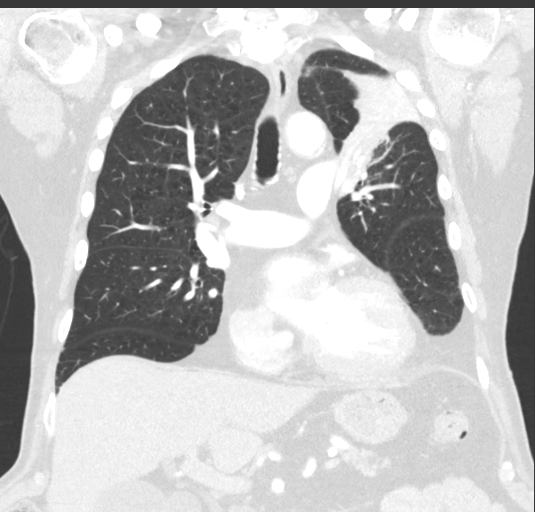
[im 80/134  lung]
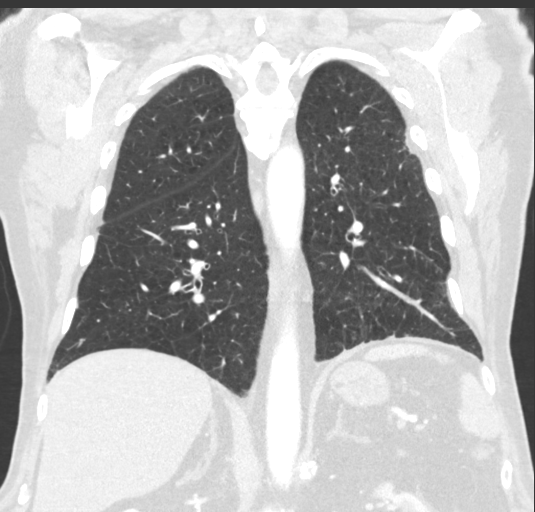

[15 of 36 positions shown; findings below may reference images not displayed]

FINDINGS: Cardiovascular: The heart is normal in size. No pericardial
effusion. Thinning of the left ventricular apex (series 2/image
128), suggesting sequela of prior MRI.

No evidence of thoracic aortic aneurysm. Atherosclerotic
calcifications of the aortic arch.

Coronary atherosclerosis of the LAD and right circumflex.

Mediastinum/Nodes: No suspicious mediastinal lymphadenopathy.

Visualized thyroid is unremarkable.

Lungs/Pleura: Triangular opacity in the left upper lobe (series
5/image 40), progressive from the prior, with adjacent fiducial
markers. Opacity extends to the left perihilar region (series
2/image 52). This appearance is non masslike and favors radiation
changes on CT, but given the delayed time interval since completion
of radiation, the progression is unusual.

Mild centrilobular and paraseptal emphysematous changes, upper lung
predominant.

5 mm subpleural nodule in the left lower lobe (series 5/image 108),
unchanged. No new/suspicious pulmonary nodules.

No focal consolidation.

No pleural effusion or pneumothorax.

Underlying

Upper Abdomen: Visualized upper abdomen is notable for
cholelithiasis and a probable 1.8 cm cystic lesion in the uncinate
process (series 2/image 178), incompletely visualized. Vascular
calcifications.

Musculoskeletal: Degenerative changes of the visualized
thoracolumbar spine.
IMPRESSION: Progressive triangular opacity left upper lobe, possibly reflecting
radiation changes (favored), although unusual given the delayed
progression from prior radiation. Consider PET-CT for further
evaluation.

Additional ancillary findings as above.

Aortic Atherosclerosis (FYAEK-SV2.2) and Emphysema ([id number].1).

## 2023-09-21 ENCOUNTER — Ambulatory Visit: Payer: PPO | Admitting: Neurology

## 2024-03-05 IMAGING — DX DG CHEST 2V
2 series · 2 of 2 positions shown · non-contrast
Comparison: CT done on 11/14/2021 and chest radiograph done on
03/09/2019

CLINICAL DATA: Chest congestion, cough, cold symptoms

EXAM:
CHEST - 2 VIEW

[chest lat]
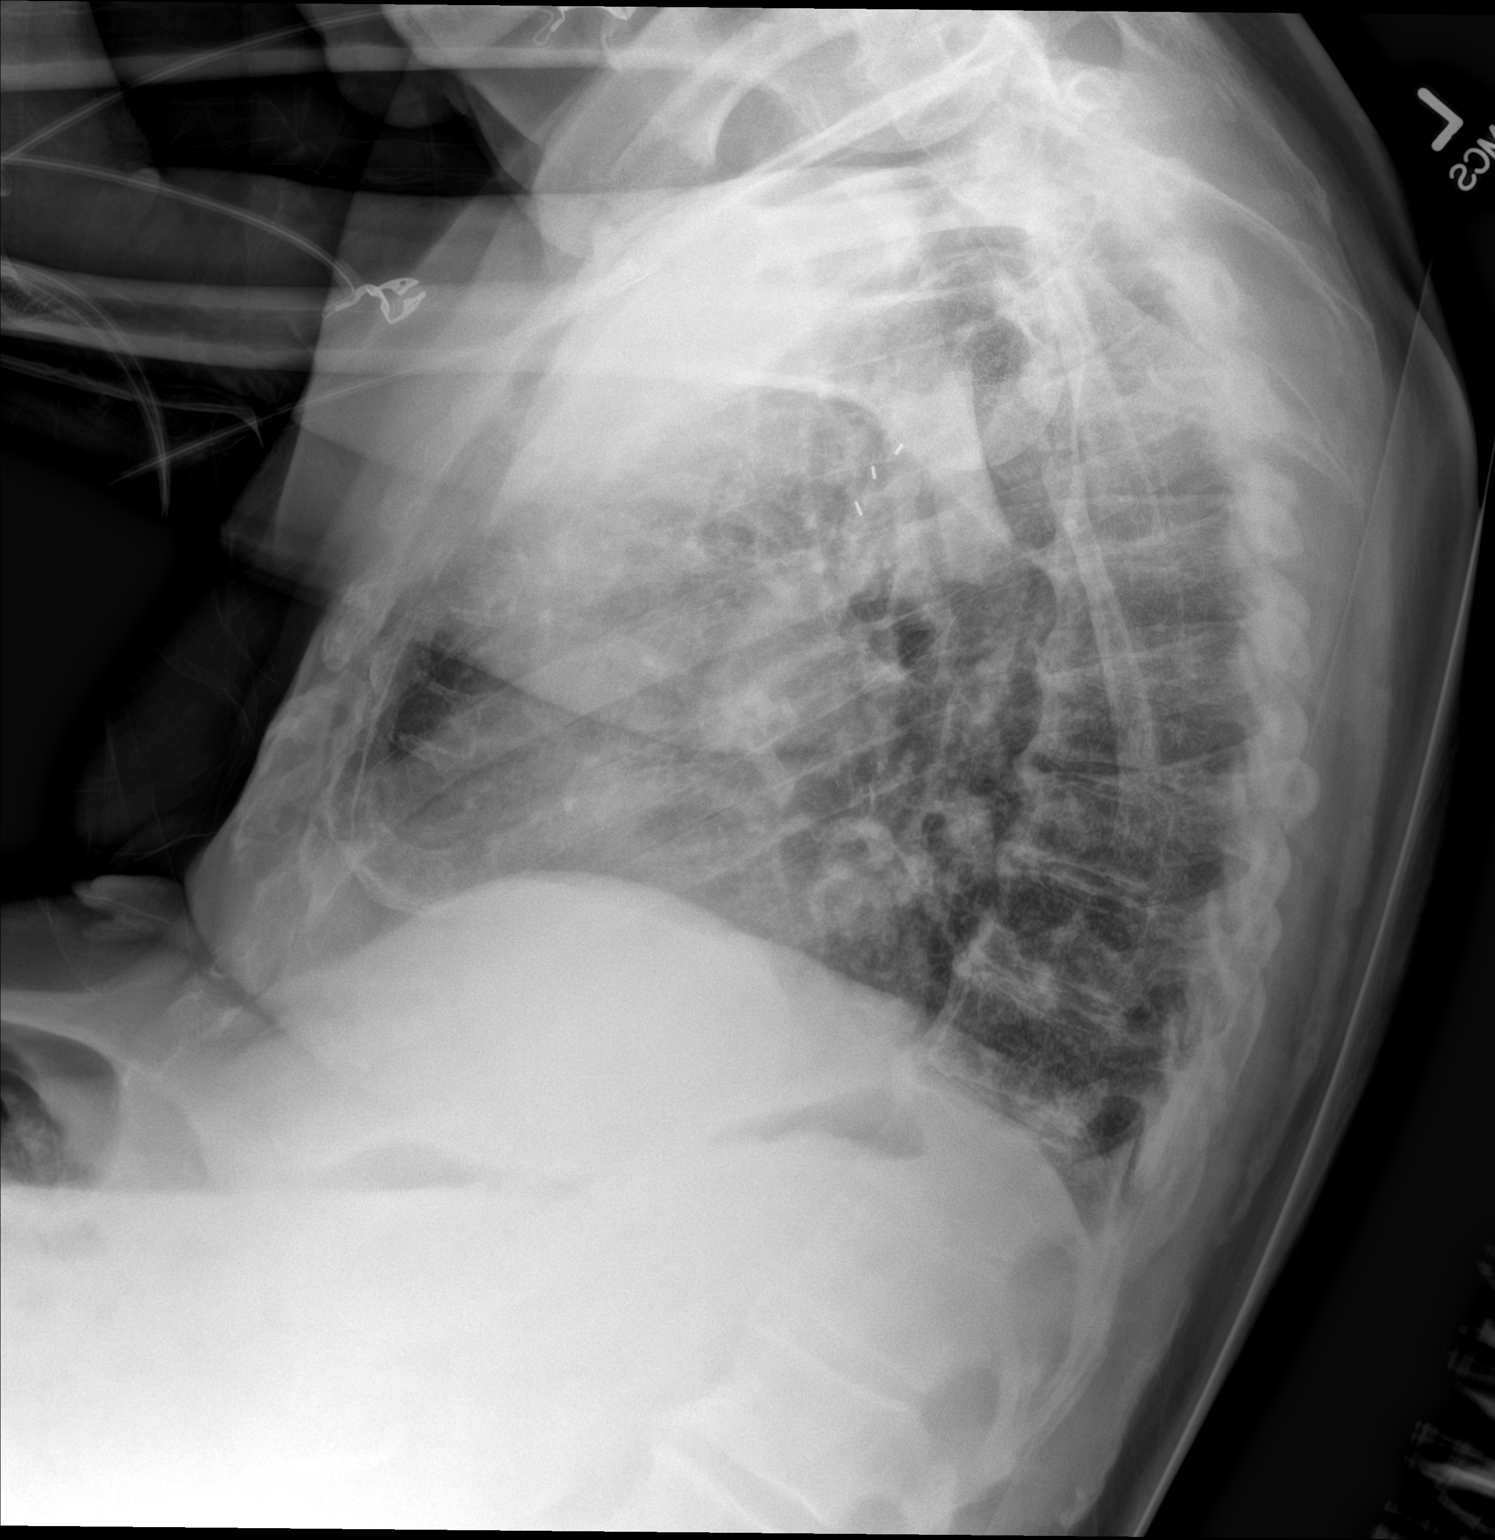

[chest ap]
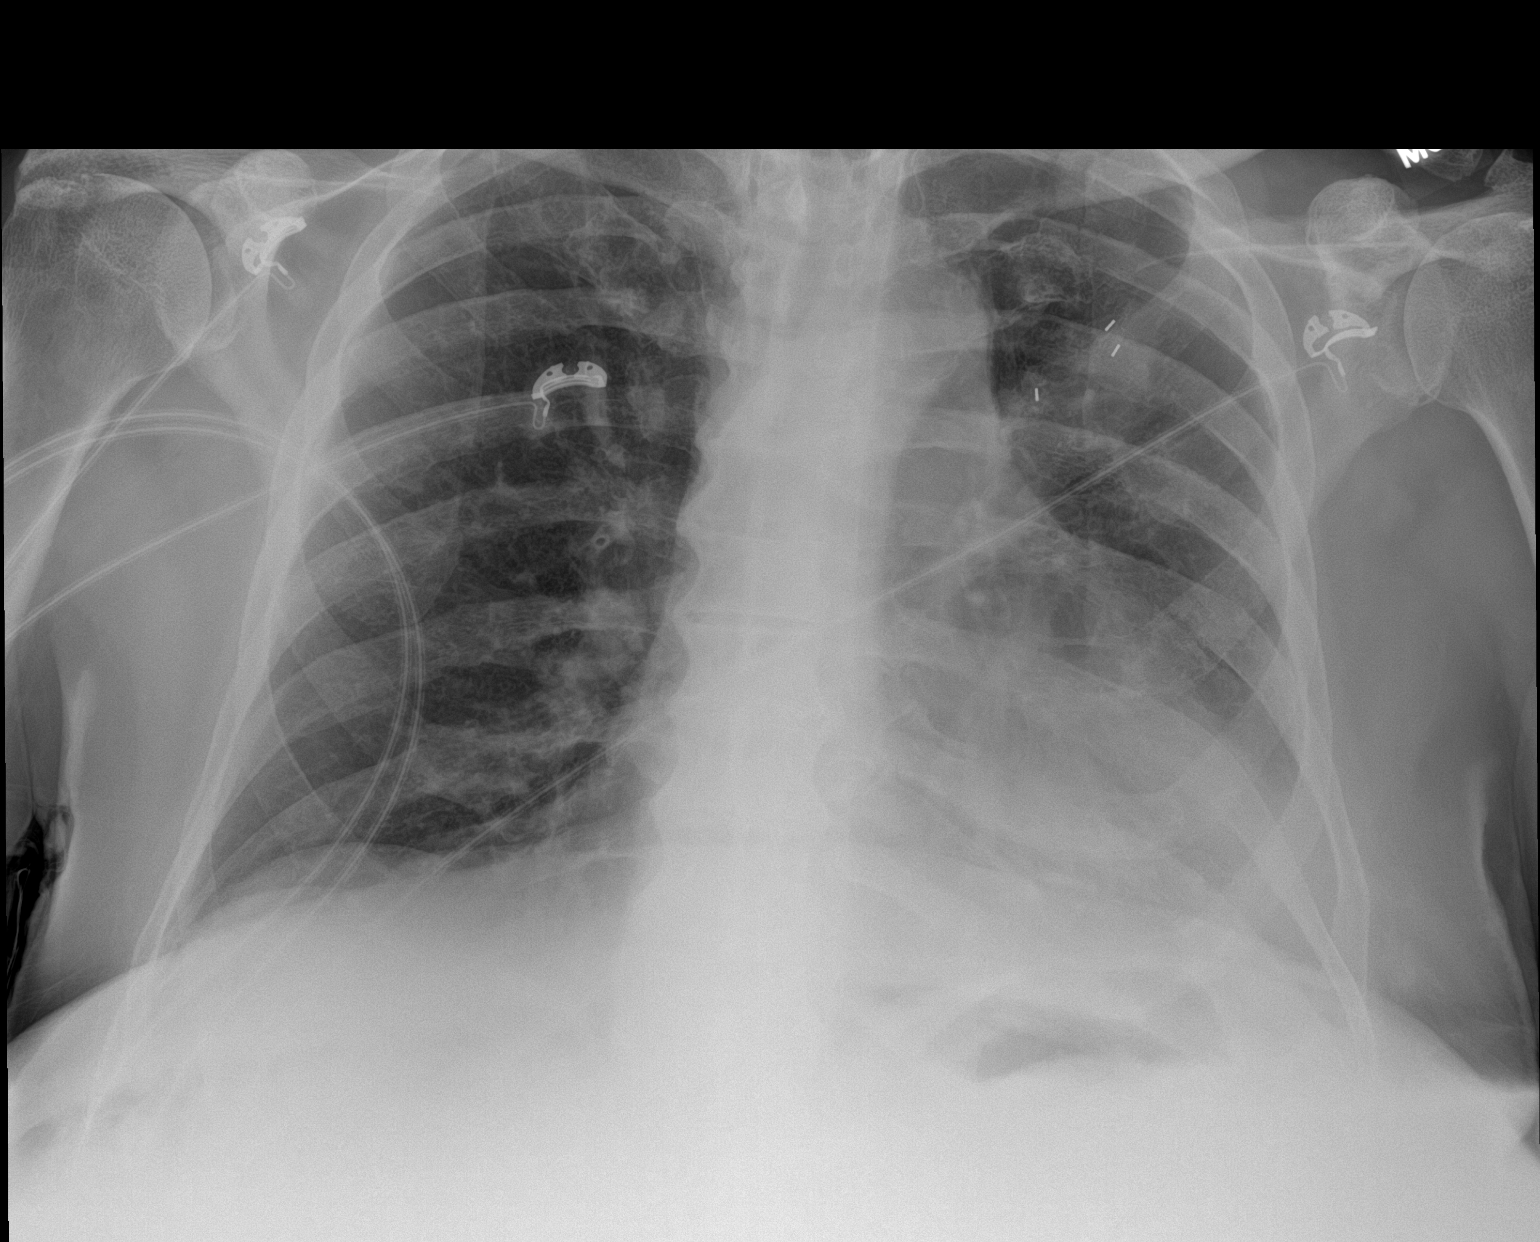

[2 of 2 positions shown; findings below may reference images not displayed]

FINDINGS: Transverse diameter of heart is increased. There are 3 fiduciary
markers in the left upper lung fields in the parahilar region. There
is faint radiopacity adjacent to the fiduciary markers, possibly
postradiation changes in the left upper lobe. Small patchy
infiltrates are seen in the medial left lower lung fields. There are
no signs of alveolar pulmonary edema. There is no significant
pleural effusion or pneumothorax.
IMPRESSION: Cardiomegaly. There are no signs of pulmonary edema. Increased
markings in the medial left lower lung fields may suggest crowding
of normal bronchovascular structures or atelectasis/pneumonia. There
is faint 4 cm radiopacity in the left parahilar region adjacent to
the fiduciary markers, possibly postradiation change.

## 2024-03-06 IMAGING — DX DG CHEST 1V PORT
1 series · 1 of 1 positions shown · non-contrast
Comparison: 03/28/2022 radiograph and prior studies

CLINICAL DATA: 87-year-old male with fall and congestion. History
of lung cancer.

EXAM:
PORTABLE CHEST 1 VIEW

[chest ap]
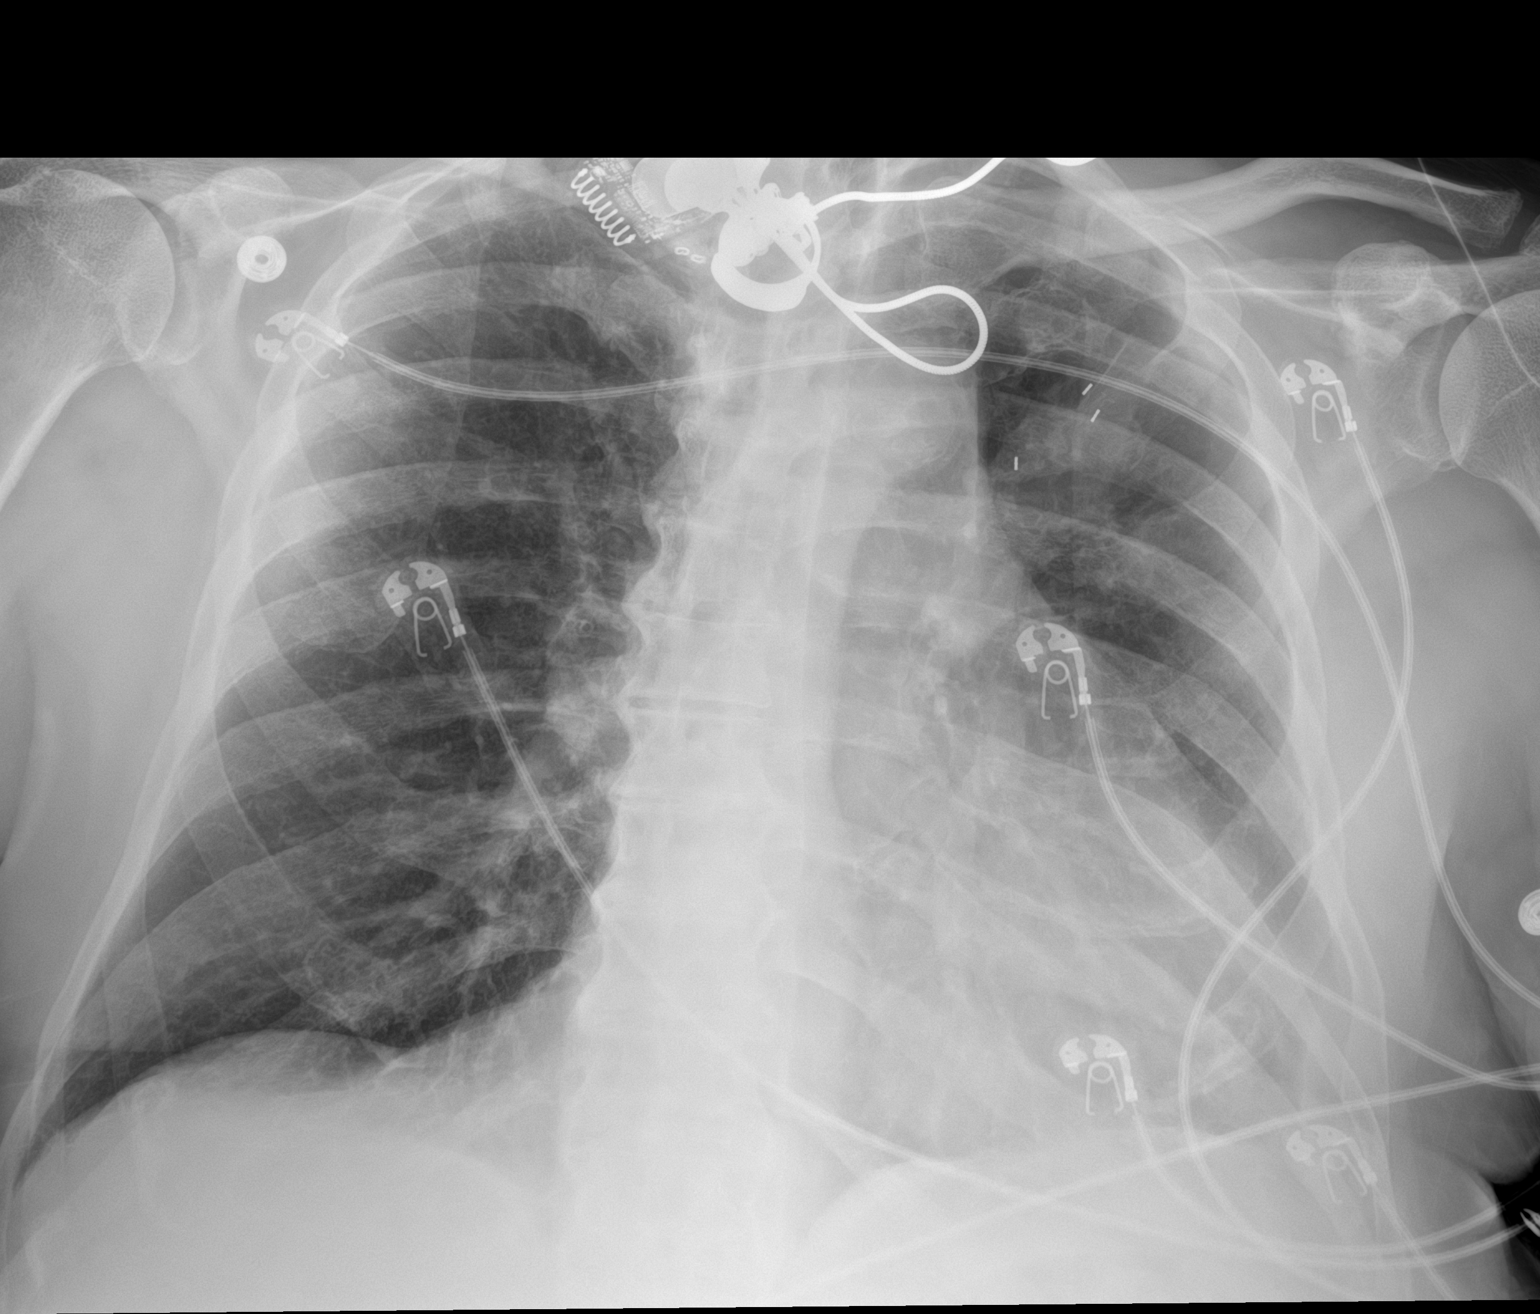

[1 of 1 positions shown; findings below may reference images not displayed]

FINDINGS: Cardiomegaly again noted.

LEFT UPPER lobe fiducial markers and underlying opacity again noted.

No new pulmonary opacities, pleural effusion or pneumothorax
identified.

No acute bony abnormalities are identified.
IMPRESSION: 1. No evidence of acute cardiopulmonary disease.
2. Cardiomegaly and LEFT UPPER lobe fiducial markers/opacity again
noted.

## 2024-03-06 IMAGING — DX DG PORTABLE PELVIS
1 series · 1 of 1 positions shown · non-contrast
Comparison: None.

CLINICAL DATA: Trauma, fall

EXAM:
PORTABLE PELVIS 1-2 VIEWS

[pelvis ap]
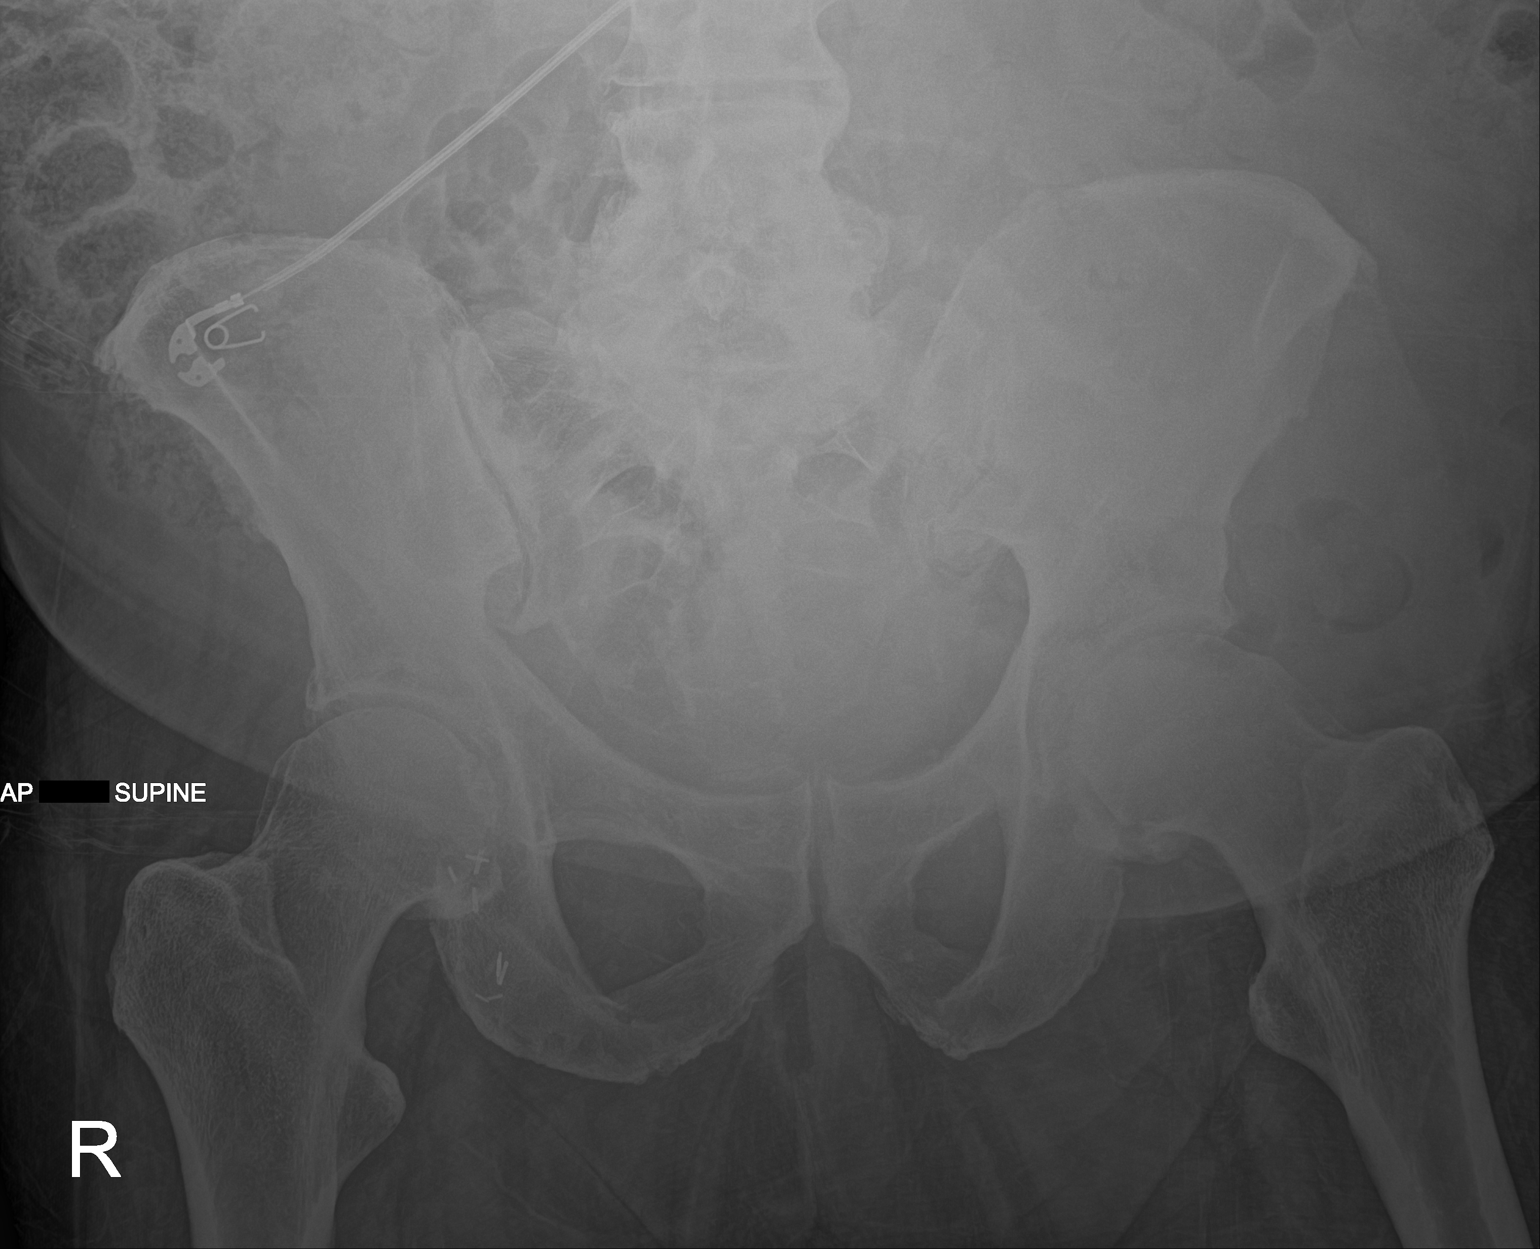

[1 of 1 positions shown; findings below may reference images not displayed]

FINDINGS: No recent fracture or dislocation is seen. There are surgical clips
medial to the proximal right femur. Bony spurs seen in the both
hips. Degenerative changes are noted in the lower lumbar spine.
IMPRESSION: No recent fracture or dislocation is seen in the pelvis.
Degenerative changes are noted in both hips and visualized lower
lumbar spine.
